# Patient Record
Sex: Male | Born: 1980 | Race: Black or African American | Hispanic: No | Marital: Single | Smoking: Never smoker
Health system: Southern US, Community
[De-identification: ages and names within clinical notes are randomized; demographics above are authoritative.]

## PROBLEM LIST (undated history)

## (undated) DIAGNOSIS — F419 Anxiety disorder, unspecified: Secondary | ICD-10-CM

## (undated) DIAGNOSIS — D571 Sickle-cell disease without crisis: Secondary | ICD-10-CM

## (undated) DIAGNOSIS — F4310 Post-traumatic stress disorder, unspecified: Secondary | ICD-10-CM

## (undated) HISTORY — PX: CHOLECYSTECTOMY: SHX55

## (undated) HISTORY — DX: Anxiety disorder, unspecified: F41.9

## (undated) HISTORY — DX: Post-traumatic stress disorder, unspecified: F43.10

---

## 2000-07-01 ENCOUNTER — Encounter: Payer: Self-pay | Admitting: Family Medicine

## 2000-07-01 ENCOUNTER — Ambulatory Visit (HOSPITAL_COMMUNITY): Admission: RE | Admit: 2000-07-01 | Discharge: 2000-07-01 | Payer: Self-pay | Admitting: Family Medicine

## 2003-06-14 ENCOUNTER — Inpatient Hospital Stay (HOSPITAL_COMMUNITY): Admission: EM | Admit: 2003-06-14 | Discharge: 2003-07-13 | Payer: Self-pay | Admitting: Family Medicine

## 2003-06-14 ENCOUNTER — Encounter: Payer: Self-pay | Admitting: Emergency Medicine

## 2003-07-24 ENCOUNTER — Inpatient Hospital Stay (HOSPITAL_COMMUNITY): Admission: EM | Admit: 2003-07-24 | Discharge: 2003-08-11 | Payer: Self-pay | Admitting: Emergency Medicine

## 2004-01-09 ENCOUNTER — Encounter (INDEPENDENT_AMBULATORY_CARE_PROVIDER_SITE_OTHER): Payer: Self-pay | Admitting: *Deleted

## 2004-01-09 ENCOUNTER — Observation Stay (HOSPITAL_COMMUNITY): Admission: EM | Admit: 2004-01-09 | Discharge: 2004-01-10 | Payer: Self-pay | Admitting: Emergency Medicine

## 2004-04-11 ENCOUNTER — Inpatient Hospital Stay (HOSPITAL_COMMUNITY): Admission: EM | Admit: 2004-04-11 | Discharge: 2004-05-03 | Payer: Self-pay | Admitting: Emergency Medicine

## 2004-05-05 ENCOUNTER — Emergency Department (HOSPITAL_COMMUNITY): Admission: EM | Admit: 2004-05-05 | Discharge: 2004-05-05 | Payer: Self-pay | Admitting: Emergency Medicine

## 2004-05-06 ENCOUNTER — Inpatient Hospital Stay (HOSPITAL_COMMUNITY): Admission: EM | Admit: 2004-05-06 | Discharge: 2004-05-21 | Payer: Self-pay | Admitting: Emergency Medicine

## 2004-05-23 ENCOUNTER — Inpatient Hospital Stay (HOSPITAL_COMMUNITY): Admission: EM | Admit: 2004-05-23 | Discharge: 2004-05-29 | Payer: Self-pay | Admitting: Emergency Medicine

## 2004-06-15 ENCOUNTER — Ambulatory Visit: Payer: Self-pay | Admitting: Hematology & Oncology

## 2004-06-29 ENCOUNTER — Inpatient Hospital Stay (HOSPITAL_COMMUNITY): Admission: EM | Admit: 2004-06-29 | Discharge: 2004-07-24 | Payer: Self-pay | Admitting: Emergency Medicine

## 2004-06-29 ENCOUNTER — Ambulatory Visit: Payer: Self-pay | Admitting: Hematology & Oncology

## 2004-08-18 ENCOUNTER — Ambulatory Visit: Payer: Self-pay | Admitting: Hematology & Oncology

## 2004-10-13 ENCOUNTER — Ambulatory Visit: Payer: Self-pay | Admitting: Hematology & Oncology

## 2004-10-31 ENCOUNTER — Inpatient Hospital Stay (HOSPITAL_COMMUNITY): Admission: EM | Admit: 2004-10-31 | Discharge: 2004-12-01 | Payer: Self-pay | Admitting: Emergency Medicine

## 2004-11-02 ENCOUNTER — Ambulatory Visit: Payer: Self-pay | Admitting: Hematology & Oncology

## 2004-12-01 ENCOUNTER — Ambulatory Visit: Payer: Self-pay | Admitting: Hematology & Oncology

## 2004-12-02 ENCOUNTER — Emergency Department (HOSPITAL_COMMUNITY): Admission: EM | Admit: 2004-12-02 | Discharge: 2004-12-02 | Payer: Self-pay | Admitting: Emergency Medicine

## 2004-12-03 ENCOUNTER — Inpatient Hospital Stay (HOSPITAL_COMMUNITY): Admission: EM | Admit: 2004-12-03 | Discharge: 2004-12-05 | Payer: Self-pay | Admitting: Emergency Medicine

## 2004-12-15 ENCOUNTER — Ambulatory Visit: Payer: Self-pay | Admitting: Hematology & Oncology

## 2005-01-15 ENCOUNTER — Encounter: Admission: RE | Admit: 2005-01-15 | Discharge: 2005-01-15 | Payer: Self-pay | Admitting: Gastroenterology

## 2005-01-19 ENCOUNTER — Encounter: Admission: RE | Admit: 2005-01-19 | Discharge: 2005-01-19 | Payer: Self-pay | Admitting: Gastroenterology

## 2005-03-16 ENCOUNTER — Ambulatory Visit: Payer: Self-pay | Admitting: Hematology & Oncology

## 2005-06-27 ENCOUNTER — Ambulatory Visit: Payer: Self-pay | Admitting: Hematology & Oncology

## 2005-08-07 LAB — CBC WITH DIFFERENTIAL/PLATELET
Eosinophils Absolute: 0.9 10*3/uL — ABNORMAL HIGH (ref 0.0–0.5)
HCT: 34.1 % — ABNORMAL LOW (ref 38.7–49.9)
LYMPH%: 27.5 % (ref 14.0–48.0)
MONO#: 0.8 10*3/uL (ref 0.1–0.9)
NEUT#: 5.7 10*3/uL (ref 1.5–6.5)
NEUT%: 55.5 % (ref 40.0–75.0)
Platelets: 470 10*3/uL — ABNORMAL HIGH (ref 145–400)
WBC: 10.3 10*3/uL — ABNORMAL HIGH (ref 4.0–10.0)

## 2005-08-07 LAB — TECHNOLOGIST REVIEW

## 2005-08-17 ENCOUNTER — Ambulatory Visit: Payer: Self-pay | Admitting: Hematology & Oncology

## 2005-08-17 LAB — CBC WITH DIFFERENTIAL/PLATELET
BASO%: 0.6 % (ref 0.0–2.0)
Basophils Absolute: 0.1 10*3/uL (ref 0.0–0.1)
EOS%: 5.7 % (ref 0.0–7.0)
HCT: 33.4 % — ABNORMAL LOW (ref 38.7–49.9)
LYMPH%: 30.5 % (ref 14.0–48.0)
MCH: 26.3 pg — ABNORMAL LOW (ref 28.0–33.4)
MCHC: 33.3 g/dL (ref 32.0–35.9)
MCV: 79.1 fL — ABNORMAL LOW (ref 81.6–98.0)
MONO%: 8.2 % (ref 0.0–13.0)
NEUT%: 55 % (ref 40.0–75.0)
Platelets: 442 10*3/uL — ABNORMAL HIGH (ref 145–400)
lymph#: 3.4 10*3/uL — ABNORMAL HIGH (ref 0.9–3.3)

## 2005-08-22 ENCOUNTER — Inpatient Hospital Stay (HOSPITAL_COMMUNITY): Admission: EM | Admit: 2005-08-22 | Discharge: 2005-08-25 | Payer: Self-pay | Admitting: Emergency Medicine

## 2005-08-25 ENCOUNTER — Ambulatory Visit: Payer: Self-pay | Admitting: Oncology

## 2005-10-01 ENCOUNTER — Ambulatory Visit: Payer: Self-pay | Admitting: Hematology & Oncology

## 2005-10-03 LAB — CBC WITH DIFFERENTIAL/PLATELET
HCT: 33.9 % — ABNORMAL LOW (ref 38.7–49.9)
MCH: 25.3 pg — ABNORMAL LOW (ref 28.0–33.4)
MCHC: 33 g/dL (ref 32.0–35.9)
RDW: 24.6 % — ABNORMAL HIGH (ref 11.2–14.6)

## 2005-10-03 LAB — MANUAL DIFFERENTIAL
ANC (CHCC manual diff): 3.2 10*3/uL (ref 1.5–6.5)
MONO: 3 % (ref 0–14)

## 2005-10-05 LAB — COMPREHENSIVE METABOLIC PANEL
ALT: 27 U/L (ref 0–40)
AST: 32 U/L (ref 0–37)
Albumin: 4.6 g/dL (ref 3.5–5.2)
BUN: 12 mg/dL (ref 6–23)
CO2: 20 mEq/L (ref 19–32)
Calcium: 9.8 mg/dL (ref 8.4–10.5)
Chloride: 107 mEq/L (ref 96–112)
Potassium: 4.4 mEq/L (ref 3.5–5.3)

## 2005-10-05 LAB — HEMOGLOBINOPATHY EVALUATION: Hgb A2 Quant: 3.5 % — ABNORMAL HIGH (ref 2.2–3.2)

## 2005-10-31 LAB — CBC WITH DIFFERENTIAL/PLATELET
BASO%: 1.6 % (ref 0.0–2.0)
EOS%: 1.8 % (ref 0.0–7.0)
HCT: 36.9 % — ABNORMAL LOW (ref 38.7–49.9)
LYMPH%: 29 % (ref 14.0–48.0)
MCH: 23.1 pg — ABNORMAL LOW (ref 28.0–33.4)
MCHC: 31.2 g/dL — ABNORMAL LOW (ref 32.0–35.9)
MCV: 74.1 fL — ABNORMAL LOW (ref 81.6–98.0)
NEUT%: 57.9 % (ref 40.0–75.0)
Platelets: 495 10*3/uL — ABNORMAL HIGH (ref 145–400)

## 2005-11-22 ENCOUNTER — Ambulatory Visit: Payer: Self-pay | Admitting: Hematology & Oncology

## 2005-11-28 LAB — CBC WITH DIFFERENTIAL/PLATELET
Eosinophils Absolute: 0.1 10*3/uL (ref 0.0–0.5)
HGB: 10.3 g/dL — ABNORMAL LOW (ref 13.0–17.1)
LYMPH%: 35 % (ref 14.0–48.0)
MCH: 24.6 pg — ABNORMAL LOW (ref 28.0–33.4)
MCHC: 31.9 g/dL — ABNORMAL LOW (ref 32.0–35.9)
MCV: 77 fL — ABNORMAL LOW (ref 81.6–98.0)
MONO#: 0.7 10*3/uL (ref 0.1–0.9)
NEUT%: 56.5 % (ref 40.0–75.0)

## 2006-01-21 ENCOUNTER — Ambulatory Visit: Payer: Self-pay | Admitting: Hematology & Oncology

## 2006-01-23 LAB — CBC WITH DIFFERENTIAL/PLATELET
BASO%: 1.8 % (ref 0.0–2.0)
EOS%: 1.2 % (ref 0.0–7.0)
MCH: 26.2 pg — ABNORMAL LOW (ref 28.0–33.4)
MCHC: 32.6 g/dL (ref 32.0–35.9)
MONO%: 8.3 % (ref 0.0–13.0)
NEUT%: 60.9 % (ref 40.0–75.0)
RDW: 24 % — ABNORMAL HIGH (ref 11.2–14.6)
lymph#: 2.7 10*3/uL (ref 0.9–3.3)

## 2006-01-23 LAB — TECHNOLOGIST REVIEW: Technologist Review: 1

## 2006-02-08 ENCOUNTER — Emergency Department (HOSPITAL_COMMUNITY): Admission: EM | Admit: 2006-02-08 | Discharge: 2006-02-08 | Payer: Self-pay | Admitting: Emergency Medicine

## 2006-02-21 LAB — CBC WITH DIFFERENTIAL/PLATELET
BASO%: 1.5 % (ref 0.0–2.0)
LYMPH%: 34.9 % (ref 14.0–48.0)
MCH: 25.1 pg — ABNORMAL LOW (ref 28.0–33.4)
MCHC: 31.6 g/dL — ABNORMAL LOW (ref 32.0–35.9)
MCV: 79.5 fL — ABNORMAL LOW (ref 81.6–98.0)
MONO%: 10.6 % (ref 0.0–13.0)
Platelets: 312 10*3/uL (ref 145–400)
RBC: 4.78 10*6/uL (ref 4.20–5.71)

## 2006-02-21 LAB — TECHNOLOGIST REVIEW

## 2006-03-18 ENCOUNTER — Ambulatory Visit: Payer: Self-pay | Admitting: Hematology & Oncology

## 2006-03-20 LAB — CBC WITH DIFFERENTIAL/PLATELET
Basophils Absolute: 0.1 10*3/uL (ref 0.0–0.1)
EOS%: 1.9 % (ref 0.0–7.0)
HGB: 11.1 g/dL — ABNORMAL LOW (ref 13.0–17.1)
LYMPH%: 33.7 % (ref 14.0–48.0)
MCH: 25.6 pg — ABNORMAL LOW (ref 28.0–33.4)
MCV: 78.3 fL — ABNORMAL LOW (ref 81.6–98.0)
MONO%: 8.7 % (ref 0.0–13.0)
NEUT%: 55.2 % (ref 40.0–75.0)
RDW: 26.3 % — ABNORMAL HIGH (ref 11.2–14.6)

## 2006-05-10 ENCOUNTER — Ambulatory Visit: Payer: Self-pay | Admitting: Hematology & Oncology

## 2006-05-15 LAB — CBC WITH DIFFERENTIAL/PLATELET
EOS%: 2.1 % (ref 0.0–7.0)
LYMPH%: 41.9 % (ref 14.0–48.0)
MCH: 25.4 pg — ABNORMAL LOW (ref 28.0–33.4)
MCHC: 32.1 g/dL (ref 32.0–35.9)
MCV: 79.1 fL — ABNORMAL LOW (ref 81.6–98.0)
MONO%: 6.8 % (ref 0.0–13.0)
Platelets: 471 10*3/uL — ABNORMAL HIGH (ref 145–400)
RBC: 4.37 10*6/uL (ref 4.20–5.71)
RDW: 21.3 % — ABNORMAL HIGH (ref 11.2–14.6)

## 2006-05-15 LAB — TECHNOLOGIST REVIEW

## 2006-07-08 ENCOUNTER — Ambulatory Visit: Payer: Self-pay | Admitting: Hematology & Oncology

## 2006-07-11 LAB — CBC WITH DIFFERENTIAL/PLATELET
BASO%: 3.2 % — ABNORMAL HIGH (ref 0.0–2.0)
EOS%: 0.7 % (ref 0.0–7.0)
HCT: 34 % — ABNORMAL LOW (ref 38.7–49.9)
LYMPH%: 23.4 % (ref 14.0–48.0)
MCH: 23.8 pg — ABNORMAL LOW (ref 28.0–33.4)
MCHC: 31.7 g/dL — ABNORMAL LOW (ref 32.0–35.9)
MONO#: 1.5 10*3/uL — ABNORMAL HIGH (ref 0.1–0.9)
NEUT%: 58.8 % (ref 40.0–75.0)
Platelets: 473 10*3/uL — ABNORMAL HIGH (ref 145–400)
RBC: 4.53 10*6/uL (ref 4.20–5.71)
WBC: 11 10*3/uL — ABNORMAL HIGH (ref 4.0–10.0)
lymph#: 2.6 10*3/uL (ref 0.9–3.3)

## 2006-07-11 LAB — TECHNOLOGIST REVIEW

## 2006-07-15 LAB — HEMOGLOBINOPATHY EVALUATION
Hgb A: 0 % — ABNORMAL LOW (ref 96.8–97.8)
Hgb S Quant: 88.6 % — ABNORMAL HIGH (ref 0.0–0.0)

## 2006-07-15 LAB — FERRITIN: Ferritin: 28 ng/mL (ref 22–322)

## 2006-08-27 ENCOUNTER — Ambulatory Visit: Payer: Self-pay | Admitting: Hematology & Oncology

## 2006-08-29 ENCOUNTER — Ambulatory Visit: Payer: Self-pay | Admitting: Hematology & Oncology

## 2006-08-29 LAB — CBC WITH DIFFERENTIAL/PLATELET
Basophils Absolute: 0.2 10*3/uL — ABNORMAL HIGH (ref 0.0–0.1)
EOS%: 8.8 % — ABNORMAL HIGH (ref 0.0–7.0)
HCT: 32.9 % — ABNORMAL LOW (ref 38.7–49.9)
HGB: 10.6 g/dL — ABNORMAL LOW (ref 13.0–17.1)
MCH: 25.5 pg — ABNORMAL LOW (ref 28.0–33.4)
MCV: 78.7 fL — ABNORMAL LOW (ref 81.6–98.0)
MONO%: 7.5 % (ref 0.0–13.0)
NEUT%: 54.3 % (ref 40.0–75.0)

## 2006-08-29 LAB — TECHNOLOGIST REVIEW: Technologist Review: 4

## 2006-10-22 ENCOUNTER — Ambulatory Visit: Payer: Self-pay | Admitting: Hematology & Oncology

## 2006-10-24 ENCOUNTER — Ambulatory Visit: Payer: Self-pay | Admitting: Hematology & Oncology

## 2006-10-24 LAB — CHCC SMEAR

## 2006-10-24 LAB — CBC & DIFF AND RETIC
Basophils Absolute: 0.2 10*3/uL — ABNORMAL HIGH (ref 0.0–0.1)
Eosinophils Absolute: 0.2 10*3/uL (ref 0.0–0.5)
HGB: 10.7 g/dL — ABNORMAL LOW (ref 13.0–17.1)
IRF: 0.57 — ABNORMAL HIGH (ref 0.070–0.380)
MONO#: 1.1 10*3/uL — ABNORMAL HIGH (ref 0.1–0.9)
NEUT#: 5.3 10*3/uL (ref 1.5–6.5)
RDW: 19.6 % — ABNORMAL HIGH (ref 11.2–14.6)
RETIC #: 329.4 10*3/uL — ABNORMAL HIGH (ref 31.8–103.9)
Retic %: 7.4 % — ABNORMAL HIGH (ref 0.7–2.3)
WBC: 9.2 10*3/uL (ref 4.0–10.0)
lymph#: 2.4 10*3/uL (ref 0.9–3.3)

## 2006-10-28 LAB — HEMOGLOBINOPATHY EVALUATION: Hgb A: 0 % — ABNORMAL LOW (ref 96.8–97.8)

## 2006-10-28 LAB — FERRITIN: Ferritin: 25 ng/mL (ref 22–322)

## 2006-11-21 LAB — CBC WITH DIFFERENTIAL/PLATELET
Basophils Absolute: 0.1 10*3/uL (ref 0.0–0.1)
Eosinophils Absolute: 0.1 10*3/uL (ref 0.0–0.5)
HCT: 34.2 % — ABNORMAL LOW (ref 38.7–49.9)
HGB: 10.8 g/dL — ABNORMAL LOW (ref 13.0–17.1)
MCH: 22.9 pg — ABNORMAL LOW (ref 28.0–33.4)
MONO#: 0.8 10*3/uL (ref 0.1–0.9)
NEUT#: 5.1 10*3/uL (ref 1.5–6.5)
NEUT%: 62.5 % (ref 40.0–75.0)
WBC: 8.2 10*3/uL (ref 4.0–10.0)
lymph#: 2.1 10*3/uL (ref 0.9–3.3)

## 2006-12-17 ENCOUNTER — Ambulatory Visit: Payer: Self-pay | Admitting: Hematology & Oncology

## 2006-12-19 ENCOUNTER — Ambulatory Visit: Payer: Self-pay | Admitting: Hematology & Oncology

## 2006-12-19 LAB — CBC WITH DIFFERENTIAL/PLATELET
Basophils Absolute: 0.2 10*3/uL — ABNORMAL HIGH (ref 0.0–0.1)
Eosinophils Absolute: 0.2 10*3/uL (ref 0.0–0.5)
HCT: 35.1 % — ABNORMAL LOW (ref 38.7–49.9)
HGB: 11 g/dL — ABNORMAL LOW (ref 13.0–17.1)
LYMPH%: 32.2 % (ref 14.0–48.0)
MCV: 73.8 fL — ABNORMAL LOW (ref 81.6–98.0)
MONO#: 1 10*3/uL — ABNORMAL HIGH (ref 0.1–0.9)
MONO%: 9.7 % (ref 0.0–13.0)
NEUT#: 5.7 10*3/uL (ref 1.5–6.5)
Platelets: 406 10*3/uL — ABNORMAL HIGH (ref 145–400)
WBC: 10.4 10*3/uL — ABNORMAL HIGH (ref 4.0–10.0)

## 2007-01-16 LAB — CBC WITH DIFFERENTIAL/PLATELET
BASO%: 1.5 % (ref 0.0–2.0)
Eosinophils Absolute: 0.1 10*3/uL (ref 0.0–0.5)
HCT: 35.2 % — ABNORMAL LOW (ref 38.7–49.9)
LYMPH%: 41.2 % (ref 14.0–48.0)
MCHC: 31.6 g/dL — ABNORMAL LOW (ref 32.0–35.9)
MCV: 76.4 fL — ABNORMAL LOW (ref 81.6–98.0)
MONO#: 0.9 10*3/uL (ref 0.1–0.9)
MONO%: 12.1 % (ref 0.0–13.0)
NEUT%: 43.8 % (ref 40.0–75.0)
Platelets: 426 10*3/uL — ABNORMAL HIGH (ref 145–400)
RBC: 4.61 10*6/uL (ref 4.20–5.71)

## 2007-01-16 LAB — TECHNOLOGIST REVIEW

## 2007-01-16 LAB — RETICULOCYTES: Retic %: 6.1 % — ABNORMAL HIGH (ref 0.7–2.3)

## 2007-01-20 LAB — HEMOGLOBINOPATHY EVALUATION
Hemoglobin Other: 0 % (ref 0.0–0.0)
Hgb A2 Quant: 3.4 % — ABNORMAL HIGH (ref 2.2–3.2)
Hgb A: 0 % — ABNORMAL LOW (ref 96.8–97.8)
Hgb S Quant: 89.6 % — ABNORMAL HIGH (ref 0.0–0.0)

## 2007-01-20 LAB — FERRITIN: Ferritin: 26 ng/mL (ref 22–322)

## 2007-02-11 ENCOUNTER — Ambulatory Visit: Payer: Self-pay | Admitting: Hematology & Oncology

## 2007-04-08 ENCOUNTER — Ambulatory Visit: Payer: Self-pay | Admitting: Hematology & Oncology

## 2007-04-11 ENCOUNTER — Ambulatory Visit: Payer: Self-pay | Admitting: Hematology & Oncology

## 2007-04-11 LAB — CBC WITH DIFFERENTIAL/PLATELET
BASO%: 1.3 % (ref 0.0–2.0)
Basophils Absolute: 0.1 10*3/uL (ref 0.0–0.1)
EOS%: 1 % (ref 0.0–7.0)
HCT: 32.1 % — ABNORMAL LOW (ref 38.7–49.9)
HGB: 10.5 g/dL — ABNORMAL LOW (ref 13.0–17.1)
MCH: 26.2 pg — ABNORMAL LOW (ref 28.0–33.4)
MCHC: 32.7 g/dL (ref 32.0–35.9)
MCV: 80.2 fL — ABNORMAL LOW (ref 81.6–98.0)
MONO%: 11 % (ref 0.0–13.0)
NEUT%: 46.8 % (ref 40.0–75.0)
RDW: 17.5 % — ABNORMAL HIGH (ref 11.2–14.6)

## 2007-05-08 LAB — CBC & DIFF AND RETIC
BASO%: 1.8 % (ref 0.0–2.0)
EOS%: 1.2 % (ref 0.0–7.0)
LYMPH%: 35.3 % (ref 14.0–48.0)
MCH: 23.1 pg — ABNORMAL LOW (ref 28.0–33.4)
MCHC: 31.1 g/dL — ABNORMAL LOW (ref 32.0–35.9)
MCV: 74.2 fL — ABNORMAL LOW (ref 81.6–98.0)
MONO%: 8.9 % (ref 0.0–13.0)
Platelets: 446 10*3/uL — ABNORMAL HIGH (ref 145–400)
RBC: 4.74 10*6/uL (ref 4.20–5.71)
RDW: 16.4 % — ABNORMAL HIGH (ref 11.2–14.6)
Retic %: 4.6 % — ABNORMAL HIGH (ref 0.7–2.3)

## 2007-05-14 LAB — HEMOGLOBINOPATHY EVALUATION
Hemoglobin Other: 0 % (ref 0.0–0.0)
Hgb A: 0 % — ABNORMAL LOW (ref 96.8–97.8)
Hgb S Quant: 87.6 % — ABNORMAL HIGH (ref 0.0–0.0)

## 2007-06-03 ENCOUNTER — Ambulatory Visit: Payer: Self-pay | Admitting: Hematology & Oncology

## 2007-07-29 ENCOUNTER — Ambulatory Visit: Payer: Self-pay | Admitting: Hematology & Oncology

## 2007-07-31 LAB — CBC WITH DIFFERENTIAL/PLATELET
BASO%: 1.6 % (ref 0.0–2.0)
Basophils Absolute: 0.1 10*3/uL (ref 0.0–0.1)
EOS%: 4.8 % (ref 0.0–7.0)
Eosinophils Absolute: 0.4 10*3/uL (ref 0.0–0.5)
HCT: 36.8 % — ABNORMAL LOW (ref 38.7–49.9)
HGB: 11.7 g/dL — ABNORMAL LOW (ref 13.0–17.1)
LYMPH%: 37.7 % (ref 14.0–48.0)
MCH: 23.8 pg — ABNORMAL LOW (ref 28.0–33.4)
MCHC: 31.9 g/dL — ABNORMAL LOW (ref 32.0–35.9)
MCV: 74.6 fL — ABNORMAL LOW (ref 81.6–98.0)
MONO#: 0.9 10*3/uL (ref 0.1–0.9)
MONO%: 11.5 % (ref 0.0–13.0)
NEUT#: 3.7 10*3/uL (ref 1.5–6.5)
NEUT%: 44.4 % (ref 40.0–75.0)
Platelets: 408 10*3/uL — ABNORMAL HIGH (ref 145–400)
RBC: 4.93 10*6/uL (ref 4.20–5.71)
RDW: 21.3 % — ABNORMAL HIGH (ref 11.2–14.6)
WBC: 8.3 10*3/uL (ref 4.0–10.0)
lymph#: 3.1 10*3/uL (ref 0.9–3.3)

## 2007-09-23 ENCOUNTER — Ambulatory Visit: Payer: Self-pay | Admitting: Hematology & Oncology

## 2007-09-25 LAB — CBC WITH DIFFERENTIAL/PLATELET
BASO%: 1.7 % (ref 0.0–2.0)
EOS%: 2.2 % (ref 0.0–7.0)
MCH: 24.1 pg — ABNORMAL LOW (ref 28.0–33.4)
MCV: 74 fL — ABNORMAL LOW (ref 81.6–98.0)
MONO%: 10.4 % (ref 0.0–13.0)
NEUT#: 3.8 10*3/uL (ref 1.5–6.5)
RBC: 4.76 10*6/uL (ref 4.20–5.71)
RDW: 21 % — ABNORMAL HIGH (ref 11.2–14.6)

## 2007-09-25 LAB — TECHNOLOGIST REVIEW: Technologist Review: 1

## 2007-10-23 LAB — CBC WITH DIFFERENTIAL/PLATELET
BASO%: 2.2 % — ABNORMAL HIGH (ref 0.0–2.0)
Eosinophils Absolute: 0.1 10*3/uL (ref 0.0–0.5)
MCHC: 32.5 g/dL (ref 32.0–35.9)
MONO#: 1.1 10*3/uL — ABNORMAL HIGH (ref 0.1–0.9)
NEUT#: 5.2 10*3/uL (ref 1.5–6.5)
RBC: 4.4 10*6/uL (ref 4.20–5.71)
RDW: 21.4 % — ABNORMAL HIGH (ref 11.2–14.6)
WBC: 9.8 10*3/uL (ref 4.0–10.0)

## 2007-10-23 LAB — TECHNOLOGIST REVIEW: Technologist Review: 1

## 2007-10-27 ENCOUNTER — Ambulatory Visit: Payer: Self-pay | Admitting: Hematology & Oncology

## 2007-10-29 LAB — CBC WITH DIFFERENTIAL (CANCER CENTER ONLY)
BASO#: 0.1 10*3/uL (ref 0.0–0.2)
EOS%: 1.9 % (ref 0.0–7.0)
Eosinophils Absolute: 0.2 10*3/uL (ref 0.0–0.5)
HGB: 11.5 g/dL — ABNORMAL LOW (ref 13.0–17.1)
LYMPH%: 35.6 % (ref 14.0–48.0)
MCH: 25 pg — ABNORMAL LOW (ref 28.0–33.4)
MCHC: 31.3 g/dL — ABNORMAL LOW (ref 32.0–35.9)
MCV: 80 fL — ABNORMAL LOW (ref 82–98)
MONO%: 7.5 % (ref 0.0–13.0)
NEUT#: 4.8 10*3/uL (ref 1.5–6.5)
NEUT%: 54.4 % (ref 40.0–80.0)
RBC: 4.61 10*6/uL (ref 4.20–5.70)

## 2007-10-29 LAB — CHCC SATELLITE - SMEAR

## 2007-10-31 LAB — RETICULOCYTES (CHCC)
RBC.: 4.82 MIL/uL (ref 4.22–5.81)
Retic Ct Pct: 7.8 % — ABNORMAL HIGH (ref 0.4–3.1)

## 2007-10-31 LAB — HEMOGLOBINOPATHY EVALUATION
Hgb F Quant: 8.1 % — ABNORMAL HIGH (ref 0.0–2.0)
Hgb S Quant: 88.3 % — ABNORMAL HIGH (ref 0.0–0.0)

## 2007-11-18 ENCOUNTER — Ambulatory Visit: Payer: Self-pay | Admitting: Hematology & Oncology

## 2007-11-20 LAB — CBC WITH DIFFERENTIAL (CANCER CENTER ONLY)
HCT: 37.3 % — ABNORMAL LOW (ref 38.7–49.9)
HGB: 11.7 g/dL — ABNORMAL LOW (ref 13.0–17.1)
MCV: 76 fL — ABNORMAL LOW (ref 82–98)
RDW: 19.4 % — ABNORMAL HIGH (ref 10.5–14.6)
WBC: 9 10*3/uL (ref 4.0–10.0)

## 2007-11-20 LAB — MANUAL DIFFERENTIAL (CHCC SATELLITE)
ALC: 1.2 10*3/uL (ref 0.9–3.3)
ANC (CHCC HP manual diff): 7 10*3/uL — ABNORMAL HIGH (ref 1.5–6.5)
MONO: 6 % (ref 0–13)
PLT EST ~~LOC~~: ADEQUATE

## 2007-12-17 ENCOUNTER — Ambulatory Visit: Payer: Self-pay | Admitting: Hematology & Oncology

## 2008-02-11 ENCOUNTER — Ambulatory Visit: Payer: Self-pay | Admitting: Hematology & Oncology

## 2008-02-12 LAB — MANUAL DIFFERENTIAL (CHCC SATELLITE)
ALC: 2.5 10*3/uL (ref 0.9–3.3)
ANC (CHCC HP manual diff): 4.7 10*3/uL (ref 1.5–6.5)
LYMPH: 31 % (ref 14–48)
PLT EST ~~LOC~~: ADEQUATE
nRBC: 5 % — ABNORMAL HIGH (ref 0–0)

## 2008-02-12 LAB — CBC WITH DIFFERENTIAL (CANCER CENTER ONLY)
HGB: 10.5 g/dL — ABNORMAL LOW (ref 13.0–17.1)
MCH: 26 pg — ABNORMAL LOW (ref 28.0–33.4)
MCV: 81 fL — ABNORMAL LOW (ref 82–98)
RBC: 4.03 10*6/uL — ABNORMAL LOW (ref 4.20–5.70)
WBC: 8.2 10*3/uL (ref 4.0–10.0)

## 2008-02-12 LAB — RETICULOCYTES (CHCC): RBC.: 4.17 MIL/uL — ABNORMAL LOW (ref 4.22–5.81)

## 2008-03-11 LAB — CBC WITH DIFFERENTIAL (CANCER CENTER ONLY)
EOS%: 3 % (ref 0.0–7.0)
HCT: 33.1 % — ABNORMAL LOW (ref 38.7–49.9)
MCH: 26.3 pg — ABNORMAL LOW (ref 28.0–33.4)
MCHC: 32.2 g/dL (ref 32.0–35.9)
MCV: 82 fL (ref 82–98)
MONO#: 0.9 10*3/uL (ref 0.1–0.9)
NEUT%: 56 % (ref 40.0–80.0)
RDW: 18.4 % — ABNORMAL HIGH (ref 10.5–14.6)

## 2008-04-07 ENCOUNTER — Ambulatory Visit: Payer: Self-pay | Admitting: Hematology & Oncology

## 2008-05-12 LAB — CBC WITH DIFFERENTIAL (CANCER CENTER ONLY)
BASO#: 0.1 10*3/uL (ref 0.0–0.2)
Eosinophils Absolute: 0.2 10*3/uL (ref 0.0–0.5)
HCT: 34 % — ABNORMAL LOW (ref 38.7–49.9)
LYMPH%: 42.2 % (ref 14.0–48.0)
MCH: 26 pg — ABNORMAL LOW (ref 28.0–33.4)
MCV: 81 fL — ABNORMAL LOW (ref 82–98)
MONO#: 0.6 10*3/uL (ref 0.1–0.9)
MONO%: 7.4 % (ref 0.0–13.0)
NEUT%: 47.7 % (ref 40.0–80.0)
RBC: 4.2 10*6/uL (ref 4.20–5.70)
WBC: 8.6 10*3/uL (ref 4.0–10.0)

## 2008-05-12 LAB — TECHNOLOGIST REVIEW CHCC SATELLITE: Tech Review: 2

## 2008-06-02 ENCOUNTER — Ambulatory Visit: Payer: Self-pay | Admitting: Hematology & Oncology

## 2008-06-07 LAB — MANUAL DIFFERENTIAL (CHCC SATELLITE)
ALC: 3.9 10*3/uL — ABNORMAL HIGH (ref 0.9–3.3)
Eos: 1 % (ref 0–7)
LYMPH: 46 % (ref 14–48)
MONO: 12 % (ref 0–13)
PLT EST ~~LOC~~: INCREASED
SEG: 40 % (ref 40–75)

## 2008-06-07 LAB — CBC WITH DIFFERENTIAL (CANCER CENTER ONLY)
HGB: 11.3 g/dL — ABNORMAL LOW (ref 13.0–17.1)
MCH: 24.7 pg — ABNORMAL LOW (ref 28.0–33.4)
MCHC: 30.7 g/dL — ABNORMAL LOW (ref 32.0–35.9)

## 2008-07-27 ENCOUNTER — Ambulatory Visit: Payer: Self-pay | Admitting: Hematology & Oncology

## 2008-07-28 LAB — CBC WITH DIFFERENTIAL (CANCER CENTER ONLY)
BASO#: 0.1 10*3/uL (ref 0.0–0.2)
EOS%: 4 % (ref 0.0–7.0)
HCT: 34.1 % — ABNORMAL LOW (ref 38.7–49.9)
HGB: 11 g/dL — ABNORMAL LOW (ref 13.0–17.1)
LYMPH#: 2 10*3/uL (ref 0.9–3.3)
MCH: 27.4 pg — ABNORMAL LOW (ref 28.0–33.4)
MCHC: 32.3 g/dL (ref 32.0–35.9)
MONO%: 7.3 % (ref 0.0–13.0)
NEUT%: 67.8 % (ref 40.0–80.0)

## 2008-07-31 ENCOUNTER — Inpatient Hospital Stay (HOSPITAL_COMMUNITY): Admission: EM | Admit: 2008-07-31 | Discharge: 2008-08-01 | Payer: Self-pay | Admitting: Emergency Medicine

## 2008-08-16 LAB — CBC WITH DIFFERENTIAL (CANCER CENTER ONLY)
Eosinophils Absolute: 0.5 10*3/uL (ref 0.0–0.5)
MONO#: 0.4 10*3/uL (ref 0.1–0.9)
NEUT#: 3.4 10*3/uL (ref 1.5–6.5)
Platelets: 659 10*3/uL — ABNORMAL HIGH (ref 145–400)
RBC: 3.7 10*6/uL — ABNORMAL LOW (ref 4.20–5.70)
WBC: 7.4 10*3/uL (ref 4.0–10.0)

## 2008-08-18 LAB — HEMOGLOBINOPATHY EVALUATION
Hgb A2 Quant: 3.5 % — ABNORMAL HIGH (ref 2.2–3.2)
Hgb A: 0 % — ABNORMAL LOW (ref 96.8–97.8)

## 2008-09-21 ENCOUNTER — Ambulatory Visit: Payer: Self-pay | Admitting: Hematology & Oncology

## 2008-09-22 LAB — MANUAL DIFFERENTIAL (CHCC SATELLITE)
ALC: 2.2 10*3/uL (ref 0.9–3.3)
ANC (CHCC HP manual diff): 5.2 10*3/uL (ref 1.5–6.5)
Eos: 4 % (ref 0–7)
MONO: 8 % (ref 0–13)
PLT EST ~~LOC~~: INCREASED

## 2008-09-22 LAB — CBC WITH DIFFERENTIAL (CANCER CENTER ONLY)
HCT: 36.8 % — ABNORMAL LOW (ref 38.7–49.9)
MCV: 87 fL (ref 82–98)
RBC: 4.24 10*6/uL (ref 4.20–5.70)
WBC: 8.5 10*3/uL (ref 4.0–10.0)

## 2008-10-01 LAB — JAK2 GENOTYPR: JAK2 GenotypR: NOT DETECTED

## 2008-10-01 LAB — LACTATE DEHYDROGENASE: LDH: 195 U/L (ref 94–250)

## 2008-10-26 ENCOUNTER — Ambulatory Visit: Payer: Self-pay | Admitting: Hematology & Oncology

## 2008-10-27 LAB — MANUAL DIFFERENTIAL (CHCC SATELLITE)
ALC: 4.2 10*3/uL — ABNORMAL HIGH (ref 0.9–3.3)
PLT EST ~~LOC~~: ADEQUATE
SEG: 41 % (ref 40–75)
nRBC: 4 % — ABNORMAL HIGH (ref 0–0)

## 2008-10-27 LAB — CBC WITH DIFFERENTIAL (CANCER CENTER ONLY)
HGB: 11.3 g/dL — ABNORMAL LOW (ref 13.0–17.1)
MCHC: 31.9 g/dL — ABNORMAL LOW (ref 32.0–35.9)
RDW: 17.4 % — ABNORMAL HIGH (ref 10.5–14.6)

## 2008-11-25 ENCOUNTER — Ambulatory Visit: Payer: Self-pay | Admitting: Hematology & Oncology

## 2008-11-25 LAB — CBC WITH DIFFERENTIAL (CANCER CENTER ONLY)
MCH: 27.2 pg — ABNORMAL LOW (ref 28.0–33.4)
MCV: 84 fL (ref 82–98)
Platelets: 364 10*3/uL (ref 145–400)
RDW: 17.3 % — ABNORMAL HIGH (ref 10.5–14.6)
WBC: 7.9 10*3/uL (ref 4.0–10.0)

## 2008-11-25 LAB — MANUAL DIFFERENTIAL (CHCC SATELLITE)
ALC: 3.2 10*3/uL (ref 0.9–3.3)
ANC (CHCC HP manual diff): 4 10*3/uL (ref 1.5–6.5)
PLT EST ~~LOC~~: ADEQUATE
SEG: 50 % (ref 40–75)

## 2008-11-29 LAB — HEMOGLOBINOPATHY EVALUATION
Hemoglobin Other: 0 % (ref 0.0–0.0)
Hgb F Quant: 10.4 % — ABNORMAL HIGH (ref 0.0–2.0)
Hgb S Quant: 86.2 % — ABNORMAL HIGH (ref 0.0–0.0)

## 2008-12-22 LAB — MANUAL DIFFERENTIAL (CHCC SATELLITE)
ALC: 2.9 10*3/uL (ref 0.9–3.3)
ANC (CHCC HP manual diff): 6 10*3/uL (ref 1.5–6.5)
LYMPH: 30 % (ref 14–48)
PLT EST ~~LOC~~: ADEQUATE
SEG: 62 % (ref 40–75)

## 2008-12-22 LAB — CBC WITH DIFFERENTIAL (CANCER CENTER ONLY)
HCT: 32.2 % — ABNORMAL LOW (ref 38.7–49.9)
HGB: 10.5 g/dL — ABNORMAL LOW (ref 13.0–17.1)
MCH: 27.8 pg — ABNORMAL LOW (ref 28.0–33.4)
RBC: 3.78 10*6/uL — ABNORMAL LOW (ref 4.20–5.70)

## 2009-01-19 ENCOUNTER — Ambulatory Visit: Payer: Self-pay | Admitting: Hematology & Oncology

## 2009-01-20 LAB — CBC WITH DIFFERENTIAL (CANCER CENTER ONLY)
BASO#: 0.1 10*3/uL (ref 0.0–0.2)
EOS%: 2.3 % (ref 0.0–7.0)
HGB: 12 g/dL — ABNORMAL LOW (ref 13.0–17.1)
LYMPH#: 3.4 10*3/uL — ABNORMAL HIGH (ref 0.9–3.3)
MCHC: 32.1 g/dL (ref 32.0–35.9)
MONO#: 0.6 10*3/uL (ref 0.1–0.9)
NEUT#: 3.4 10*3/uL (ref 1.5–6.5)
RBC: 4.54 10*6/uL (ref 4.20–5.70)
WBC: 7.5 10*3/uL (ref 4.0–10.0)

## 2009-01-20 LAB — TECHNOLOGIST REVIEW CHCC SATELLITE

## 2009-01-20 LAB — CHCC SATELLITE - SMEAR

## 2009-01-21 LAB — RETICULOCYTES (CHCC)
ABS Retic: 331.9 10*3/uL — ABNORMAL HIGH (ref 19.0–186.0)
RBC.: 4.61 MIL/uL (ref 4.22–5.81)

## 2009-01-21 LAB — FERRITIN: Ferritin: 22 ng/mL (ref 22–322)

## 2009-04-15 ENCOUNTER — Ambulatory Visit: Payer: Self-pay | Admitting: Hematology & Oncology

## 2009-04-18 LAB — CBC WITH DIFFERENTIAL (CANCER CENTER ONLY)
HCT: 37.4 % — ABNORMAL LOW (ref 38.7–49.9)
MCH: 27.3 pg — ABNORMAL LOW (ref 28.0–33.4)
MCHC: 31.6 g/dL — ABNORMAL LOW (ref 32.0–35.9)
MCV: 87 fL (ref 82–98)
RBC: 4.32 10*6/uL (ref 4.20–5.70)
RDW: 17.9 % — ABNORMAL HIGH (ref 10.5–14.6)
WBC: 8.7 10*3/uL (ref 4.0–10.0)

## 2009-04-18 LAB — MANUAL DIFFERENTIAL (CHCC SATELLITE)
LYMPH: 21 % (ref 14–48)
MONO: 14 % — ABNORMAL HIGH (ref 0–13)
PLT EST ~~LOC~~: ADEQUATE

## 2009-04-20 LAB — COMPREHENSIVE METABOLIC PANEL
Albumin: 4.5 g/dL (ref 3.5–5.2)
Alkaline Phosphatase: 86 U/L (ref 39–117)
BUN: 12 mg/dL (ref 6–23)
CO2: 20 mEq/L (ref 19–32)
Chloride: 105 mEq/L (ref 96–112)
Creatinine, Ser: 1.02 mg/dL (ref 0.40–1.50)
Glucose, Bld: 89 mg/dL (ref 70–99)
Total Bilirubin: 2.8 mg/dL — ABNORMAL HIGH (ref 0.3–1.2)
Total Protein: 7.6 g/dL (ref 6.0–8.3)

## 2009-04-20 LAB — FERRITIN: Ferritin: 38 ng/mL (ref 22–322)

## 2009-07-19 ENCOUNTER — Ambulatory Visit: Payer: Self-pay | Admitting: Hematology & Oncology

## 2009-08-15 LAB — COMPREHENSIVE METABOLIC PANEL
AST: 35 U/L (ref 0–37)
Albumin: 4.6 g/dL (ref 3.5–5.2)
Alkaline Phosphatase: 88 U/L (ref 39–117)
BUN: 13 mg/dL (ref 6–23)
Potassium: 4.2 mEq/L (ref 3.5–5.3)
Sodium: 138 mEq/L (ref 135–145)

## 2009-08-15 LAB — CBC WITH DIFFERENTIAL (CANCER CENTER ONLY)
BASO%: 0.6 % (ref 0.0–2.0)
EOS%: 2.9 % (ref 0.0–7.0)
LYMPH#: 3.1 10*3/uL (ref 0.9–3.3)
MCHC: 32.3 g/dL (ref 32.0–35.9)
NEUT#: 4.4 10*3/uL (ref 1.5–6.5)
NEUT%: 51.7 % (ref 40.0–80.0)
Platelets: 388 10*3/uL (ref 145–400)
RDW: 17 % — ABNORMAL HIGH (ref 10.5–14.6)

## 2009-10-14 ENCOUNTER — Ambulatory Visit: Payer: Self-pay | Admitting: Hematology & Oncology

## 2009-10-17 LAB — CBC WITH DIFFERENTIAL (CANCER CENTER ONLY)
HCT: 33.7 % — ABNORMAL LOW (ref 38.7–49.9)
MCH: 27.9 pg — ABNORMAL LOW (ref 28.0–33.4)
MCHC: 32.5 g/dL (ref 32.0–35.9)
MCV: 86 fL (ref 82–98)
WBC: 7.7 10*3/uL (ref 4.0–10.0)

## 2009-10-17 LAB — MANUAL DIFFERENTIAL (CHCC SATELLITE)
ALC: 1.6 10*3/uL (ref 0.9–3.3)
LYMPH: 21 % (ref 14–48)
Myelocytes: 1 % — ABNORMAL HIGH (ref 0–0)
PLT EST ~~LOC~~: ADEQUATE
SEG: 71 % (ref 40–75)

## 2009-10-19 LAB — FERRITIN: Ferritin: 66 ng/mL (ref 22–322)

## 2009-10-19 LAB — COMPREHENSIVE METABOLIC PANEL
AST: 37 U/L (ref 0–37)
Albumin: 4.2 g/dL (ref 3.5–5.2)
Alkaline Phosphatase: 83 U/L (ref 39–117)
CO2: 18 mEq/L — ABNORMAL LOW (ref 19–32)
Potassium: 4.3 mEq/L (ref 3.5–5.3)
Sodium: 137 mEq/L (ref 135–145)
Total Bilirubin: 3.3 mg/dL — ABNORMAL HIGH (ref 0.3–1.2)
Total Protein: 7.4 g/dL (ref 6.0–8.3)

## 2009-10-19 LAB — HEMOGLOBINOPATHY EVALUATION
Hemoglobin Other: 0 % (ref 0.0–0.0)
Hgb A2 Quant: 3.3 % — ABNORMAL HIGH (ref 2.2–3.2)
Hgb A: 0 % — ABNORMAL LOW (ref 96.8–97.8)
Hgb F Quant: 10.4 % — ABNORMAL HIGH (ref 0.0–2.0)
Hgb S Quant: 86.3 % — ABNORMAL HIGH (ref 0.0–0.0)

## 2010-01-10 ENCOUNTER — Ambulatory Visit: Payer: Self-pay | Admitting: Hematology & Oncology

## 2010-01-12 LAB — CBC WITH DIFFERENTIAL (CANCER CENTER ONLY)
BASO%: 0.7 % (ref 0.0–2.0)
HCT: 33 % — ABNORMAL LOW (ref 38.7–49.9)
HGB: 10.6 g/dL — ABNORMAL LOW (ref 13.0–17.1)
LYMPH#: 3.6 10*3/uL — ABNORMAL HIGH (ref 0.9–3.3)
LYMPH%: 44.1 % (ref 14.0–48.0)
MCHC: 32 g/dL (ref 32.0–35.9)
MONO#: 0.7 10*3/uL (ref 0.1–0.9)
MONO%: 8.4 % (ref 0.0–13.0)
NEUT#: 3.7 10*3/uL (ref 1.5–6.5)
NEUT%: 44.8 % (ref 40.0–80.0)
WBC: 8.3 10*3/uL (ref 4.0–10.0)

## 2010-01-12 LAB — TECHNOLOGIST REVIEW CHCC SATELLITE: Tech Review: 3

## 2010-01-16 LAB — COMPREHENSIVE METABOLIC PANEL
ALT: 17 U/L (ref 0–53)
Alkaline Phosphatase: 82 U/L (ref 39–117)
Calcium: 9.6 mg/dL (ref 8.4–10.5)
Glucose, Bld: 110 mg/dL — ABNORMAL HIGH (ref 70–99)
Total Bilirubin: 2.6 mg/dL — ABNORMAL HIGH (ref 0.3–1.2)

## 2010-01-16 LAB — HEMOGLOBINOPATHY EVALUATION
Hemoglobin Other: 0 % (ref 0.0–0.0)
Hgb A2 Quant: 3.3 % — ABNORMAL HIGH (ref 2.2–3.2)
Hgb A: 0 % — ABNORMAL LOW (ref 96.8–97.8)
Hgb S Quant: 86.7 % — ABNORMAL HIGH (ref 0.0–0.0)

## 2010-01-16 LAB — FERRITIN: Ferritin: 44 ng/mL (ref 22–322)

## 2010-02-21 ENCOUNTER — Inpatient Hospital Stay (HOSPITAL_COMMUNITY)
Admission: EM | Admit: 2010-02-21 | Discharge: 2010-02-26 | Payer: Self-pay | Source: Home / Self Care | Admitting: Emergency Medicine

## 2010-02-28 ENCOUNTER — Inpatient Hospital Stay (HOSPITAL_COMMUNITY)
Admission: EM | Admit: 2010-02-28 | Discharge: 2010-03-15 | Payer: Self-pay | Source: Home / Self Care | Attending: Hematology & Oncology | Admitting: Hematology & Oncology

## 2010-03-04 ENCOUNTER — Ambulatory Visit: Payer: Self-pay | Admitting: Hematology and Oncology

## 2010-03-23 ENCOUNTER — Ambulatory Visit: Payer: Self-pay | Admitting: Hematology & Oncology

## 2010-03-24 LAB — CBC WITH DIFFERENTIAL (CANCER CENTER ONLY)
BASO#: 0.1 10*3/uL (ref 0.0–0.2)
Eosinophils Absolute: 0.1 10*3/uL (ref 0.0–0.5)
HGB: 13.6 g/dL (ref 13.0–17.1)
LYMPH%: 45.8 % (ref 14.0–48.0)
MCH: 29.8 pg (ref 28.0–33.4)
MCV: 91 fL (ref 82–98)
MONO%: 8.3 % (ref 0.0–13.0)
Platelets: ADEQUATE 10*3/uL (ref 145–400)
RBC: 4.56 10*6/uL (ref 4.20–5.70)

## 2010-03-28 LAB — COMPREHENSIVE METABOLIC PANEL
ALT: 29 U/L (ref 0–53)
CO2: 20 mEq/L (ref 19–32)
Calcium: 9.4 mg/dL (ref 8.4–10.5)
Chloride: 110 mEq/L (ref 96–112)
Potassium: 4 mEq/L (ref 3.5–5.3)
Sodium: 141 mEq/L (ref 135–145)
Total Protein: 7.9 g/dL (ref 6.0–8.3)

## 2010-03-28 LAB — HEMOGLOBINOPATHY EVALUATION: Hgb A: 62.1 % — ABNORMAL LOW (ref 96.8–97.8)

## 2010-03-28 LAB — RETICULOCYTES (CHCC): ABS Retic: 70.4 10*3/uL (ref 19.0–186.0)

## 2010-04-20 LAB — CBC WITH DIFFERENTIAL (CANCER CENTER ONLY)
BASO#: 0.1 10*3/uL (ref 0.0–0.2)
BASO%: 0.8 % (ref 0.0–2.0)
EOS%: 1.5 % (ref 0.0–7.0)
Eosinophils Absolute: 0.1 10*3/uL (ref 0.0–0.5)
HCT: 42.6 % (ref 38.7–49.9)
HGB: 14.3 g/dL (ref 13.0–17.1)
LYMPH#: 3.5 10*3/uL — ABNORMAL HIGH (ref 0.9–3.3)
LYMPH%: 35.9 % (ref 14.0–48.0)
MCH: 31.3 pg (ref 28.0–33.4)
MCHC: 33.6 g/dL (ref 32.0–35.9)
MCV: 93 fL (ref 82–98)
MONO#: 0.7 10*3/uL (ref 0.1–0.9)
MONO%: 7.5 % (ref 0.0–13.0)
NEUT#: 5.2 10*3/uL (ref 1.5–6.5)
NEUT%: 54.3 % (ref 40.0–80.0)
Platelets: 309 10*3/uL (ref 145–400)
RBC: 4.58 10*6/uL (ref 4.20–5.70)
RDW: 15.9 % — ABNORMAL HIGH (ref 10.5–14.6)
WBC: 9.6 10*3/uL (ref 4.0–10.0)

## 2010-04-20 LAB — TECHNOLOGIST REVIEW CHCC SATELLITE

## 2010-04-25 LAB — RETICULOCYTES (CHCC)
ABS Retic: 311.2 10*3/uL — ABNORMAL HIGH (ref 19.0–186.0)
RBC.: 4.51 MIL/uL (ref 4.22–5.81)
Retic Ct Pct: 6.9 % — ABNORMAL HIGH (ref 0.4–3.1)

## 2010-04-25 LAB — HEMOGLOBINOPATHY EVALUATION
Hemoglobin Other: 0 % (ref 0.0–0.0)
Hgb A2 Quant: 2.9 % (ref 2.2–3.2)
Hgb A: 45.4 % — ABNORMAL LOW (ref 96.8–97.8)
Hgb F Quant: 6.7 % — ABNORMAL HIGH (ref 0.0–2.0)
Hgb S Quant: 45 % — ABNORMAL HIGH (ref 0.0–0.0)

## 2010-04-25 LAB — LACTATE DEHYDROGENASE: LDH: 204 U/L (ref 94–250)

## 2010-04-25 LAB — FERRITIN: Ferritin: 1007 ng/mL — ABNORMAL HIGH (ref 22–322)

## 2010-05-22 ENCOUNTER — Other Ambulatory Visit: Payer: Self-pay | Admitting: Hematology & Oncology

## 2010-05-22 ENCOUNTER — Encounter (HOSPITAL_BASED_OUTPATIENT_CLINIC_OR_DEPARTMENT_OTHER): Payer: Medicare Other | Admitting: Hematology & Oncology

## 2010-05-22 DIAGNOSIS — D571 Sickle-cell disease without crisis: Secondary | ICD-10-CM

## 2010-05-22 DIAGNOSIS — Z23 Encounter for immunization: Secondary | ICD-10-CM

## 2010-05-22 DIAGNOSIS — M8708 Idiopathic aseptic necrosis of bone, other site: Secondary | ICD-10-CM

## 2010-05-22 LAB — CBC WITH DIFFERENTIAL (CANCER CENTER ONLY)
BASO#: 0.1 10*3/uL (ref 0.0–0.2)
BASO%: 0.7 % (ref 0.0–2.0)
EOS%: 2.1 % (ref 0.0–7.0)
HGB: 13.1 g/dL (ref 13.0–17.1)
MONO%: 8.3 % (ref 0.0–13.0)
NEUT#: 4 10*3/uL (ref 1.5–6.5)
NEUT%: 46.4 % (ref 40.0–80.0)
Platelets: 289 10*3/uL (ref 145–400)
RBC: 4.24 10*6/uL (ref 4.20–5.70)
WBC: 8.7 10*3/uL (ref 4.0–10.0)

## 2010-05-24 LAB — RETICULOCYTES (CHCC): ABS Retic: 470.8 10*3/uL — ABNORMAL HIGH (ref 19.0–186.0)

## 2010-05-24 LAB — LACTATE DEHYDROGENASE: LDH: 231 U/L (ref 94–250)

## 2010-05-24 LAB — HEMOGLOBINOPATHY EVALUATION
Hgb A: 26.7 % — ABNORMAL LOW (ref 96.8–97.8)
Hgb F Quant: 8.1 % — ABNORMAL HIGH (ref 0.0–2.0)
Hgb S Quant: 62.2 % — ABNORMAL HIGH (ref 0.0–0.0)

## 2010-05-24 LAB — HAPTOGLOBIN: Haptoglobin: <6 mg/dL — ABNORMAL LOW (ref 16–200)

## 2010-06-19 LAB — CBC
HCT: 37.9 % — ABNORMAL LOW (ref 39.0–52.0)
HCT: 39.4 % (ref 39.0–52.0)
HCT: 41 % (ref 39.0–52.0)
HCT: 42 % (ref 39.0–52.0)
Hemoglobin: 12 g/dL — ABNORMAL LOW (ref 13.0–17.0)
Hemoglobin: 14 g/dL (ref 13.0–17.0)
MCH: 30.2 pg (ref 26.0–34.0)
MCH: 30.3 pg (ref 26.0–34.0)
MCHC: 32.6 g/dL (ref 30.0–36.0)
MCHC: 32.9 g/dL (ref 30.0–36.0)
MCHC: 33.3 g/dL (ref 30.0–36.0)
MCHC: 33.8 g/dL (ref 30.0–36.0)
MCV: 93.1 fL (ref 78.0–100.0)
MCV: 93.1 fL (ref 78.0–100.0)
Platelets: 452 10*3/uL — ABNORMAL HIGH (ref 150–400)
Platelets: 509 10*3/uL — ABNORMAL HIGH (ref 150–400)
Platelets: 585 10*3/uL — ABNORMAL HIGH (ref 150–400)
RBC: 3.94 MIL/uL — ABNORMAL LOW (ref 4.22–5.81)
RBC: 4.08 MIL/uL — ABNORMAL LOW (ref 4.22–5.81)
RBC: 4.23 MIL/uL (ref 4.22–5.81)
RDW: 17.9 % — ABNORMAL HIGH (ref 11.5–15.5)
RDW: 18.7 % — ABNORMAL HIGH (ref 11.5–15.5)
RDW: 18.7 % — ABNORMAL HIGH (ref 11.5–15.5)
RDW: 18.8 % — ABNORMAL HIGH (ref 11.5–15.5)
RDW: 18.8 % — ABNORMAL HIGH (ref 11.5–15.5)
WBC: 5.3 10*3/uL (ref 4.0–10.5)
WBC: 5.5 10*3/uL (ref 4.0–10.5)
WBC: 6.9 10*3/uL (ref 4.0–10.5)

## 2010-06-19 LAB — RETICULOCYTES
RBC.: 4.08 MIL/uL — ABNORMAL LOW (ref 4.22–5.81)
RBC.: 4.15 MIL/uL — ABNORMAL LOW (ref 4.22–5.81)
RBC.: 4.23 MIL/uL (ref 4.22–5.81)
RBC.: 4.45 MIL/uL (ref 4.22–5.81)
Retic Count, Absolute: 101.9 10*3/uL (ref 19.0–186.0)
Retic Count, Absolute: 49 10*3/uL (ref 19.0–186.0)
Retic Count, Absolute: 59.2 10*3/uL (ref 19.0–186.0)
Retic Count, Absolute: 68.5 10*3/uL (ref 19.0–186.0)
Retic Count, Absolute: 97.9 10*3/uL (ref 19.0–186.0)
Retic Ct Pct: 2.2 % (ref 0.4–3.1)

## 2010-06-19 LAB — LACTATE DEHYDROGENASE
LDH: 324 U/L — ABNORMAL HIGH (ref 94–250)
LDH: 391 U/L — ABNORMAL HIGH (ref 94–250)
LDH: 453 U/L — ABNORMAL HIGH (ref 94–250)
LDH: 523 U/L — ABNORMAL HIGH (ref 94–250)

## 2010-06-19 LAB — COMPREHENSIVE METABOLIC PANEL
ALT: 121 U/L — ABNORMAL HIGH (ref 0–53)
ALT: 131 U/L — ABNORMAL HIGH (ref 0–53)
ALT: 52 U/L (ref 0–53)
ALT: 54 U/L — ABNORMAL HIGH (ref 0–53)
ALT: 81 U/L — ABNORMAL HIGH (ref 0–53)
ALT: 94 U/L — ABNORMAL HIGH (ref 0–53)
AST: 47 U/L — ABNORMAL HIGH (ref 0–37)
AST: 54 U/L — ABNORMAL HIGH (ref 0–37)
Albumin: 3 g/dL — ABNORMAL LOW (ref 3.5–5.2)
Albumin: 3 g/dL — ABNORMAL LOW (ref 3.5–5.2)
Albumin: 3.1 g/dL — ABNORMAL LOW (ref 3.5–5.2)
Alkaline Phosphatase: 300 U/L — ABNORMAL HIGH (ref 39–117)
Alkaline Phosphatase: 309 U/L — ABNORMAL HIGH (ref 39–117)
Alkaline Phosphatase: 334 U/L — ABNORMAL HIGH (ref 39–117)
Alkaline Phosphatase: 378 U/L — ABNORMAL HIGH (ref 39–117)
BUN: 14 mg/dL (ref 6–23)
BUN: 16 mg/dL (ref 6–23)
BUN: 17 mg/dL (ref 6–23)
CO2: 29 mEq/L (ref 19–32)
CO2: 29 mEq/L (ref 19–32)
Calcium: 10 mg/dL (ref 8.4–10.5)
Calcium: 9.4 mg/dL (ref 8.4–10.5)
Calcium: 9.8 mg/dL (ref 8.4–10.5)
Calcium: 9.8 mg/dL (ref 8.4–10.5)
Chloride: 102 mEq/L (ref 96–112)
Chloride: 103 mEq/L (ref 96–112)
Creatinine, Ser: 0.92 mg/dL (ref 0.4–1.5)
Creatinine, Ser: 0.99 mg/dL (ref 0.4–1.5)
GFR calc Af Amer: >60 mL/min (ref >60)
GFR calc Af Amer: >60 mL/min (ref >60)
GFR calc Af Amer: >60 mL/min (ref >60)
GFR calc Af Amer: >60 mL/min (ref >60)
GFR calc non Af Amer: >60 mL/min (ref >60)
GFR calc non Af Amer: >60 mL/min (ref >60)
GFR calc non Af Amer: >60 mL/min (ref >60)
Glucose, Bld: 107 mg/dL — ABNORMAL HIGH (ref 70–99)
Glucose, Bld: 89 mg/dL (ref 70–99)
Glucose, Bld: 98 mg/dL (ref 70–99)
Potassium: 4.5 mEq/L (ref 3.5–5.1)
Potassium: 4.9 mEq/L (ref 3.5–5.1)
Potassium: 5 mEq/L (ref 3.5–5.1)
Sodium: 135 mEq/L (ref 135–145)
Sodium: 137 mEq/L (ref 135–145)
Sodium: 138 mEq/L (ref 135–145)
Sodium: 138 mEq/L (ref 135–145)
Sodium: 138 mEq/L (ref 135–145)
Sodium: 139 mEq/L (ref 135–145)
Sodium: 139 mEq/L (ref 135–145)
Total Bilirubin: 0.9 mg/dL (ref 0.3–1.2)
Total Bilirubin: 1.3 mg/dL — ABNORMAL HIGH (ref 0.3–1.2)
Total Bilirubin: 1.6 mg/dL — ABNORMAL HIGH (ref 0.3–1.2)
Total Protein: 7.5 g/dL (ref 6.0–8.3)
Total Protein: 7.5 g/dL (ref 6.0–8.3)
Total Protein: 7.7 g/dL (ref 6.0–8.3)
Total Protein: 7.7 g/dL (ref 6.0–8.3)
Total Protein: 7.8 g/dL (ref 6.0–8.3)

## 2010-06-19 LAB — DIFFERENTIAL
Basophils Absolute: 0.1 10*3/uL (ref 0.0–0.1)
Basophils Relative: 1 % (ref 0–1)
Basophils Relative: 2 % — ABNORMAL HIGH (ref 0–1)
Basophils Relative: 2 % — ABNORMAL HIGH (ref 0–1)
Eosinophils Absolute: 0.2 10*3/uL (ref 0.0–0.7)
Eosinophils Relative: 3 % (ref 0–5)
Eosinophils Relative: 3 % (ref 0–5)
Eosinophils Relative: 4 % (ref 0–5)
Lymphocytes Relative: 45 % (ref 12–46)
Lymphs Abs: 2.5 10*3/uL (ref 0.7–4.0)
Monocytes Absolute: 0.8 10*3/uL (ref 0.1–1.0)
Monocytes Relative: 13 % — ABNORMAL HIGH (ref 3–12)
Monocytes Relative: 16 % — ABNORMAL HIGH (ref 3–12)
Neutro Abs: 1.9 10*3/uL (ref 1.7–7.7)
Neutro Abs: 2 10*3/uL (ref 1.7–7.7)

## 2010-06-19 LAB — GLUCOSE, CAPILLARY: Glucose-Capillary: 131 mg/dL — ABNORMAL HIGH (ref 70–99)

## 2010-06-19 LAB — HEMOGLOBINOPATHY EVALUATION
Hgb A2 Quant: 2.8 % (ref 2.2–3.2)
Hgb A: 63.2 % — ABNORMAL LOW (ref 96.8–97.8)
Hgb S Quant: 28.9 % — ABNORMAL HIGH (ref 0.0–0.0)

## 2010-06-20 LAB — COMPREHENSIVE METABOLIC PANEL
ALT: 101 U/L — ABNORMAL HIGH (ref 0–53)
ALT: 101 U/L — ABNORMAL HIGH (ref 0–53)
ALT: 156 U/L — ABNORMAL HIGH (ref 0–53)
ALT: 39 U/L (ref 0–53)
ALT: 49 U/L (ref 0–53)
ALT: 51 U/L (ref 0–53)
ALT: 54 U/L — ABNORMAL HIGH (ref 0–53)
ALT: 66 U/L — ABNORMAL HIGH (ref 0–53)
AST: 106 U/L — ABNORMAL HIGH (ref 0–37)
AST: 387 U/L — ABNORMAL HIGH (ref 0–37)
AST: 46 U/L — ABNORMAL HIGH (ref 0–37)
AST: 48 U/L — ABNORMAL HIGH (ref 0–37)
AST: 53 U/L — ABNORMAL HIGH (ref 0–37)
AST: 88 U/L — ABNORMAL HIGH (ref 0–37)
AST: 94 U/L — ABNORMAL HIGH (ref 0–37)
Albumin: 2.9 g/dL — ABNORMAL LOW (ref 3.5–5.2)
Albumin: 3.1 g/dL — ABNORMAL LOW (ref 3.5–5.2)
Albumin: 3.1 g/dL — ABNORMAL LOW (ref 3.5–5.2)
Albumin: 3.4 g/dL — ABNORMAL LOW (ref 3.5–5.2)
Albumin: 3.5 g/dL (ref 3.5–5.2)
Albumin: 3.6 g/dL (ref 3.5–5.2)
Alkaline Phosphatase: 147 U/L — ABNORMAL HIGH (ref 39–117)
Alkaline Phosphatase: 154 U/L — ABNORMAL HIGH (ref 39–117)
Alkaline Phosphatase: 439 U/L — ABNORMAL HIGH (ref 39–117)
Alkaline Phosphatase: 448 U/L — ABNORMAL HIGH (ref 39–117)
Alkaline Phosphatase: 77 U/L (ref 39–117)
BUN: 10 mg/dL (ref 6–23)
BUN: 11 mg/dL (ref 6–23)
BUN: 11 mg/dL (ref 6–23)
BUN: 19 mg/dL (ref 6–23)
CO2: 20 mEq/L (ref 19–32)
CO2: 22 mEq/L (ref 19–32)
CO2: 24 mEq/L (ref 19–32)
CO2: 27 mEq/L (ref 19–32)
Calcium: 8 mg/dL — ABNORMAL LOW (ref 8.4–10.5)
Calcium: 8.6 mg/dL (ref 8.4–10.5)
Calcium: 8.6 mg/dL (ref 8.4–10.5)
Calcium: 8.7 mg/dL (ref 8.4–10.5)
Calcium: 9.2 mg/dL (ref 8.4–10.5)
Calcium: 9.4 mg/dL (ref 8.4–10.5)
Chloride: 101 mEq/L (ref 96–112)
Chloride: 106 mEq/L (ref 96–112)
Chloride: 108 mEq/L (ref 96–112)
Chloride: 112 mEq/L (ref 96–112)
Creatinine, Ser: 0.77 mg/dL (ref 0.4–1.5)
Creatinine, Ser: 0.91 mg/dL (ref 0.4–1.5)
Creatinine, Ser: 0.93 mg/dL (ref 0.4–1.5)
Creatinine, Ser: 1.4 mg/dL (ref 0.4–1.5)
Creatinine, Ser: 1.6 mg/dL — ABNORMAL HIGH (ref 0.4–1.5)
GFR calc Af Amer: >60 mL/min (ref >60)
GFR calc Af Amer: >60 mL/min (ref >60)
GFR calc Af Amer: >60 mL/min (ref >60)
GFR calc Af Amer: >60 mL/min (ref >60)
GFR calc Af Amer: >60 mL/min (ref >60)
GFR calc Af Amer: >60 mL/min (ref >60)
GFR calc Af Amer: >60 mL/min (ref >60)
GFR calc Af Amer: >60 mL/min (ref >60)
GFR calc non Af Amer: 51 mL/min — ABNORMAL LOW (ref >60)
GFR calc non Af Amer: 60 mL/min — ABNORMAL LOW (ref >60)
GFR calc non Af Amer: >60 mL/min (ref >60)
Glucose, Bld: 105 mg/dL — ABNORMAL HIGH (ref 70–99)
Glucose, Bld: 108 mg/dL — ABNORMAL HIGH (ref 70–99)
Glucose, Bld: 113 mg/dL — ABNORMAL HIGH (ref 70–99)
Glucose, Bld: 116 mg/dL — ABNORMAL HIGH (ref 70–99)
Glucose, Bld: 127 mg/dL — ABNORMAL HIGH (ref 70–99)
Glucose, Bld: 138 mg/dL — ABNORMAL HIGH (ref 70–99)
Glucose, Bld: 177 mg/dL — ABNORMAL HIGH (ref 70–99)
Glucose, Bld: 89 mg/dL (ref 70–99)
Glucose, Bld: 93 mg/dL (ref 70–99)
Potassium: 3.8 mEq/L (ref 3.5–5.1)
Potassium: 4.3 mEq/L (ref 3.5–5.1)
Potassium: 4.5 mEq/L (ref 3.5–5.1)
Potassium: 4.5 mEq/L (ref 3.5–5.1)
Potassium: 4.5 mEq/L (ref 3.5–5.1)
Potassium: 4.6 mEq/L (ref 3.5–5.1)
Sodium: 137 mEq/L (ref 135–145)
Sodium: 138 mEq/L (ref 135–145)
Sodium: 139 mEq/L (ref 135–145)
Sodium: 141 mEq/L (ref 135–145)
Sodium: 141 mEq/L (ref 135–145)
Sodium: 141 mEq/L (ref 135–145)
Sodium: 141 mEq/L (ref 135–145)
Sodium: 141 mEq/L (ref 135–145)
Sodium: 143 mEq/L (ref 135–145)
Total Bilirubin: 1.5 mg/dL — ABNORMAL HIGH (ref 0.3–1.2)
Total Bilirubin: 3.2 mg/dL — ABNORMAL HIGH (ref 0.3–1.2)
Total Bilirubin: 3.4 mg/dL — ABNORMAL HIGH (ref 0.3–1.2)
Total Bilirubin: 4.8 mg/dL — ABNORMAL HIGH (ref 0.3–1.2)
Total Protein: 5.9 g/dL — ABNORMAL LOW (ref 6.0–8.3)
Total Protein: 6.4 g/dL (ref 6.0–8.3)
Total Protein: 6.5 g/dL (ref 6.0–8.3)
Total Protein: 6.5 g/dL (ref 6.0–8.3)
Total Protein: 6.8 g/dL (ref 6.0–8.3)
Total Protein: 7.7 g/dL (ref 6.0–8.3)
Total Protein: 7.9 g/dL (ref 6.0–8.3)

## 2010-06-20 LAB — CULTURE, BLOOD (ROUTINE X 2)
Culture  Setup Time: 201111170035
Culture  Setup Time: 201111170035
Culture  Setup Time: 201111230528
Culture  Setup Time: 201111261724
Culture: NO GROWTH
Culture: NO GROWTH
Culture: NO GROWTH
Culture: NO GROWTH

## 2010-06-20 LAB — AMMONIA: Ammonia: 56 umol/L — ABNORMAL HIGH (ref 11–35)

## 2010-06-20 LAB — CROSSMATCH
ABO/RH(D): O POS
Antibody Screen: POSITIVE
DAT, IgG: NEGATIVE
Unit division: 0
Unit division: 0
Unit division: 0
Unit division: 0
Unit division: 0

## 2010-06-20 LAB — BASIC METABOLIC PANEL
BUN: 10 mg/dL (ref 6–23)
CO2: 24 mEq/L (ref 19–32)
Calcium: 9.1 mg/dL (ref 8.4–10.5)
Chloride: 109 mEq/L (ref 96–112)
Creatinine, Ser: 1.1 mg/dL (ref 0.4–1.5)
GFR calc non Af Amer: >60 mL/min (ref >60)
GFR calc non Af Amer: >60 mL/min (ref >60)
Glucose, Bld: 114 mg/dL — ABNORMAL HIGH (ref 70–99)
Glucose, Bld: 90 mg/dL (ref 70–99)
Potassium: 3.6 mEq/L (ref 3.5–5.1)
Potassium: 4.1 mEq/L (ref 3.5–5.1)
Sodium: 139 mEq/L (ref 135–145)
Sodium: 141 mEq/L (ref 135–145)

## 2010-06-20 LAB — RETICULOCYTES
RBC.: 3.07 MIL/uL — ABNORMAL LOW (ref 4.22–5.81)
RBC.: 4.02 MIL/uL — ABNORMAL LOW (ref 4.22–5.81)
RBC.: 4.16 MIL/uL — ABNORMAL LOW (ref 4.22–5.81)
RBC.: 4.33 MIL/uL (ref 4.22–5.81)
RBC.: 4.37 MIL/uL (ref 4.22–5.81)
Retic Count, Absolute: 153 10*3/uL (ref 19.0–186.0)
Retic Count, Absolute: 177.5 10*3/uL (ref 19.0–186.0)
Retic Count, Absolute: 214.1 10*3/uL — ABNORMAL HIGH (ref 19.0–186.0)
Retic Count, Absolute: 250.9 10*3/uL — ABNORMAL HIGH (ref 19.0–186.0)
Retic Count, Absolute: 327.2 10*3/uL — ABNORMAL HIGH (ref 19.0–186.0)
Retic Count, Absolute: 442.2 10*3/uL — ABNORMAL HIGH (ref 19.0–186.0)
Retic Ct Pct: 3 % (ref 0.4–3.1)
Retic Ct Pct: 5.5 % — ABNORMAL HIGH (ref 0.4–3.1)
Retic Ct Pct: 9.8 % — ABNORMAL HIGH (ref 0.4–3.1)

## 2010-06-20 LAB — BLOOD GAS, ARTERIAL
Acid-base deficit: 2 mmol/L (ref 0.0–2.0)
Acid-base deficit: 4.3 mmol/L — ABNORMAL HIGH (ref 0.0–2.0)
Drawn by: 257701
Drawn by: 330981
Drawn by: 33686
FIO2: 0.35 %
FIO2: 1 %
FIO2: 1 %
O2 Saturation: 100.7 %
Patient temperature: 101
Patient temperature: 98.6
TCO2: 21.5 mmol/L (ref 0–100)
pCO2 arterial: 42.5 mmHg (ref 35.0–45.0)
pCO2 arterial: 47.4 mmHg — ABNORMAL HIGH (ref 35.0–45.0)
pH, Arterial: 7.267 — ABNORMAL LOW (ref 7.350–7.450)
pH, Arterial: 7.351 (ref 7.350–7.450)
pO2, Arterial: 165 mmHg — ABNORMAL HIGH (ref 80.0–100.0)
pO2, Arterial: 199 mmHg — ABNORMAL HIGH (ref 80.0–100.0)

## 2010-06-20 LAB — URINALYSIS, MICROSCOPIC ONLY
Glucose, UA: NEGATIVE mg/dL
Leukocytes, UA: NEGATIVE
Protein, ur: NEGATIVE mg/dL
Urobilinogen, UA: 0.2 mg/dL (ref 0.0–1.0)

## 2010-06-20 LAB — POCT CARDIAC MARKERS
Myoglobin, poc: 96.1 ng/mL (ref 12–200)
Troponin i, poc: <0.05 ng/mL (ref 0.00–0.09)

## 2010-06-20 LAB — DIFFERENTIAL
Basophils Absolute: 0 10*3/uL (ref 0.0–0.1)
Basophils Absolute: 0 10*3/uL (ref 0.0–0.1)
Basophils Absolute: 0.1 10*3/uL (ref 0.0–0.1)
Basophils Absolute: 0.1 10*3/uL (ref 0.0–0.1)
Basophils Absolute: 0.3 10*3/uL — ABNORMAL HIGH (ref 0.0–0.1)
Basophils Absolute: 0.4 10*3/uL — ABNORMAL HIGH (ref 0.0–0.1)
Basophils Relative: 0 % (ref 0–1)
Basophils Relative: 0 % (ref 0–1)
Basophils Relative: 1 % (ref 0–1)
Basophils Relative: 3 % — ABNORMAL HIGH (ref 0–1)
Eosinophils Absolute: 0 10*3/uL (ref 0.0–0.7)
Eosinophils Absolute: 0.1 10*3/uL (ref 0.0–0.7)
Eosinophils Absolute: 0.6 10*3/uL (ref 0.0–0.7)
Eosinophils Relative: 0 % (ref 0–5)
Eosinophils Relative: 4 % (ref 0–5)
Lymphocytes Relative: 10 % — ABNORMAL LOW (ref 12–46)
Lymphocytes Relative: 15 % (ref 12–46)
Lymphocytes Relative: 16 % (ref 12–46)
Lymphocytes Relative: 21 % (ref 12–46)
Lymphocytes Relative: 24 % (ref 12–46)
Lymphs Abs: 1 10*3/uL (ref 0.7–4.0)
Lymphs Abs: 1.4 10*3/uL (ref 0.7–4.0)
Lymphs Abs: 3.4 10*3/uL (ref 0.7–4.0)
Monocytes Relative: 10 % (ref 3–12)
Monocytes Relative: 12 % (ref 3–12)
Monocytes Relative: 6 % (ref 3–12)
Monocytes Relative: 8 % (ref 3–12)
Monocytes Relative: 9 % (ref 3–12)
Neutro Abs: 4 10*3/uL (ref 1.7–7.7)
Neutro Abs: 7.4 10*3/uL (ref 1.7–7.7)
Neutro Abs: 7.9 10*3/uL — ABNORMAL HIGH (ref 1.7–7.7)
Neutrophils Relative %: 70 % (ref 43–77)
Neutrophils Relative %: 73 % (ref 43–77)
Neutrophils Relative %: 74 % (ref 43–77)

## 2010-06-20 LAB — CBC
HCT: 24.1 % — ABNORMAL LOW (ref 39.0–52.0)
HCT: 24.9 % — ABNORMAL LOW (ref 39.0–52.0)
HCT: 26.4 % — ABNORMAL LOW (ref 39.0–52.0)
HCT: 27.9 % — ABNORMAL LOW (ref 39.0–52.0)
HCT: 28 % — ABNORMAL LOW (ref 39.0–52.0)
HCT: 28.4 % — ABNORMAL LOW (ref 39.0–52.0)
HCT: 31.8 % — ABNORMAL LOW (ref 39.0–52.0)
HCT: 32.6 % — ABNORMAL LOW (ref 39.0–52.0)
HCT: 34.5 % — ABNORMAL LOW (ref 39.0–52.0)
HCT: 38 % — ABNORMAL LOW (ref 39.0–52.0)
HCT: 38.8 % — ABNORMAL LOW (ref 39.0–52.0)
HCT: 40.2 % (ref 39.0–52.0)
Hemoglobin: 10 g/dL — ABNORMAL LOW (ref 13.0–17.0)
Hemoglobin: 10.1 g/dL — ABNORMAL LOW (ref 13.0–17.0)
Hemoglobin: 11.1 g/dL — ABNORMAL LOW (ref 13.0–17.0)
Hemoglobin: 11.2 g/dL — ABNORMAL LOW (ref 13.0–17.0)
Hemoglobin: 12.8 g/dL — ABNORMAL LOW (ref 13.0–17.0)
Hemoglobin: 12.9 g/dL — ABNORMAL LOW (ref 13.0–17.0)
Hemoglobin: 7.1 g/dL — ABNORMAL LOW (ref 13.0–17.0)
Hemoglobin: 8.5 g/dL — ABNORMAL LOW (ref 13.0–17.0)
MCH: 28.9 pg (ref 26.0–34.0)
MCH: 31.7 pg (ref 26.0–34.0)
MCH: 31.7 pg (ref 26.0–34.0)
MCH: 31.8 pg (ref 26.0–34.0)
MCH: 31.9 pg (ref 26.0–34.0)
MCHC: 34.4 g/dL (ref 30.0–36.0)
MCHC: 34.5 g/dL (ref 30.0–36.0)
MCHC: 34.8 g/dL (ref 30.0–36.0)
MCHC: 34.8 g/dL (ref 30.0–36.0)
MCHC: 34.8 g/dL (ref 30.0–36.0)
MCHC: 35.3 g/dL (ref 30.0–36.0)
MCHC: 35.4 g/dL (ref 30.0–36.0)
MCHC: 35.6 g/dL (ref 30.0–36.0)
MCV: 83 fL (ref 78.0–100.0)
MCV: 84 fL (ref 78.0–100.0)
MCV: 89.3 fL (ref 78.0–100.0)
MCV: 89.8 fL (ref 78.0–100.0)
MCV: 91.1 fL (ref 78.0–100.0)
MCV: 91.7 fL (ref 78.0–100.0)
MCV: 92.9 fL (ref 78.0–100.0)
Platelets: 101 10*3/uL — ABNORMAL LOW (ref 150–400)
Platelets: 130 10*3/uL — ABNORMAL LOW (ref 150–400)
Platelets: 150 10*3/uL (ref 150–400)
Platelets: 164 10*3/uL (ref 150–400)
Platelets: 198 10*3/uL (ref 150–400)
Platelets: 223 10*3/uL (ref 150–400)
Platelets: 265 10*3/uL (ref 150–400)
Platelets: 267 10*3/uL (ref 150–400)
RBC: 2.46 MIL/uL — ABNORMAL LOW (ref 4.22–5.81)
RBC: 2.95 MIL/uL — ABNORMAL LOW (ref 4.22–5.81)
RBC: 3.13 MIL/uL — ABNORMAL LOW (ref 4.22–5.81)
RBC: 3.21 MIL/uL — ABNORMAL LOW (ref 4.22–5.81)
RBC: 3.32 MIL/uL — ABNORMAL LOW (ref 4.22–5.81)
RBC: 4.05 MIL/uL — ABNORMAL LOW (ref 4.22–5.81)
RBC: 4.17 MIL/uL — ABNORMAL LOW (ref 4.22–5.81)
RBC: 4.17 MIL/uL — ABNORMAL LOW (ref 4.22–5.81)
RBC: 4.26 MIL/uL (ref 4.22–5.81)
RDW: 19 % — ABNORMAL HIGH (ref 11.5–15.5)
RDW: 21.7 % — ABNORMAL HIGH (ref 11.5–15.5)
RDW: 24.5 % — ABNORMAL HIGH (ref 11.5–15.5)
RDW: 25.1 % — ABNORMAL HIGH (ref 11.5–15.5)
RDW: 25.2 % — ABNORMAL HIGH (ref 11.5–15.5)
RDW: 26 % — ABNORMAL HIGH (ref 11.5–15.5)
RDW: 26.5 % — ABNORMAL HIGH (ref 11.5–15.5)
RDW: 34.4 % — ABNORMAL HIGH (ref 11.5–15.5)
RDW: 36.7 % — ABNORMAL HIGH (ref 11.5–15.5)
WBC: 10 10*3/uL (ref 4.0–10.5)
WBC: 10 10*3/uL (ref 4.0–10.5)
WBC: 10.4 10*3/uL (ref 4.0–10.5)
WBC: 11.4 10*3/uL — ABNORMAL HIGH (ref 4.0–10.5)
WBC: 12.5 10*3/uL — ABNORMAL HIGH (ref 4.0–10.5)
WBC: 13.2 10*3/uL — ABNORMAL HIGH (ref 4.0–10.5)
WBC: 20.5 10*3/uL — ABNORMAL HIGH (ref 4.0–10.5)
WBC: 21.5 10*3/uL — ABNORMAL HIGH (ref 4.0–10.5)
WBC: 5.9 10*3/uL (ref 4.0–10.5)
WBC: 6.8 10*3/uL (ref 4.0–10.5)
WBC: 9.4 10*3/uL (ref 4.0–10.5)
WBC: 9.7 10*3/uL (ref 4.0–10.5)
WBC: 9.9 10*3/uL (ref 4.0–10.5)

## 2010-06-20 LAB — HEPATITIS PANEL, ACUTE
HCV Ab: NEGATIVE
Hep A IgM: NEGATIVE
Hep B C IgM: NEGATIVE
Hepatitis B Surface Ag: NEGATIVE

## 2010-06-20 LAB — HEPATIC FUNCTION PANEL
AST: 53 U/L — ABNORMAL HIGH (ref 0–37)
Albumin: 2.8 g/dL — ABNORMAL LOW (ref 3.5–5.2)
Albumin: 3.8 g/dL (ref 3.5–5.2)
Alkaline Phosphatase: 141 U/L — ABNORMAL HIGH (ref 39–117)
Bilirubin, Direct: 0.7 mg/dL — ABNORMAL HIGH (ref 0.0–0.3)
Bilirubin, Direct: 0.9 mg/dL — ABNORMAL HIGH (ref 0.0–0.3)
Total Bilirubin: 2.9 mg/dL — ABNORMAL HIGH (ref 0.3–1.2)

## 2010-06-20 LAB — LACTATE DEHYDROGENASE
LDH: 528 U/L — ABNORMAL HIGH (ref 94–250)
LDH: 916 U/L — ABNORMAL HIGH (ref 94–250)

## 2010-06-20 LAB — HEMOGLOBINOPATHY EVALUATION: Hemoglobin Other: 0 % (ref 0.0–0.0)

## 2010-06-20 LAB — FERRITIN: Ferritin: 434 ng/mL — ABNORMAL HIGH (ref 22–322)

## 2010-06-20 LAB — URINALYSIS, ROUTINE W REFLEX MICROSCOPIC
Glucose, UA: NEGATIVE mg/dL
Nitrite: NEGATIVE
Protein, ur: NEGATIVE mg/dL
pH: 7 (ref 5.0–8.0)

## 2010-06-20 LAB — URINE CULTURE
Colony Count: NO GROWTH
Colony Count: NO GROWTH
Culture: NO GROWTH

## 2010-06-20 LAB — PROTEIN ELECTROPH W RFLX QUANT IMMUNOGLOBULINS
Albumin ELP: 53.7 % — ABNORMAL LOW (ref 55.8–66.1)
Alpha-1-Globulin: 5.3 % — ABNORMAL HIGH (ref 2.9–4.9)
Total Protein ELP: 6.4 g/dL (ref 6.0–8.3)

## 2010-06-20 LAB — RAPID URINE DRUG SCREEN, HOSP PERFORMED
Amphetamines: NOT DETECTED
Opiates: POSITIVE — AB
Tetrahydrocannabinol: NOT DETECTED

## 2010-06-20 LAB — MRSA PCR SCREENING: MRSA by PCR: POSITIVE — AB

## 2010-06-20 LAB — LACTIC ACID, PLASMA: Lactic Acid, Venous: 3.3 mmol/L — ABNORMAL HIGH (ref 0.5–2.2)

## 2010-06-20 LAB — PROCALCITONIN: Procalcitonin: 0.87 ng/mL

## 2010-06-20 LAB — PREPARE RBC (CROSSMATCH)

## 2010-06-20 LAB — HEMOGLOBIN AND HEMATOCRIT, BLOOD
HCT: 29.3 % — ABNORMAL LOW (ref 39.0–52.0)
Hemoglobin: 10.4 g/dL — ABNORMAL LOW (ref 13.0–17.0)

## 2010-06-20 LAB — VANCOMYCIN, TROUGH
Vancomycin Tr: 22.3 ug/mL — ABNORMAL HIGH (ref 10.0–20.0)
Vancomycin Tr: 9.6 ug/mL — ABNORMAL LOW (ref 10.0–20.0)

## 2010-06-20 LAB — GENTAMICIN LEVEL, RANDOM: Gentamicin Rm: 0.9 ug/mL

## 2010-07-08 ENCOUNTER — Inpatient Hospital Stay (HOSPITAL_COMMUNITY)
Admission: EM | Admit: 2010-07-08 | Discharge: 2010-07-19 | DRG: 812 | Disposition: A | Discharge status: Home / Self Care | Payer: Medicare Other | Attending: Internal Medicine | Admitting: Internal Medicine

## 2010-07-08 ENCOUNTER — Emergency Department (HOSPITAL_COMMUNITY): Payer: Medicare Other

## 2010-07-08 DIAGNOSIS — I2489 Other forms of acute ischemic heart disease: Secondary | ICD-10-CM | POA: Diagnosis present

## 2010-07-08 DIAGNOSIS — Y844 Aspiration of fluid as the cause of abnormal reaction of the patient, or of later complication, without mention of misadventure at the time of the procedure: Secondary | ICD-10-CM | POA: Diagnosis not present

## 2010-07-08 DIAGNOSIS — R0902 Hypoxemia: Secondary | ICD-10-CM | POA: Diagnosis present

## 2010-07-08 DIAGNOSIS — G971 Other reaction to spinal and lumbar puncture: Secondary | ICD-10-CM | POA: Diagnosis not present

## 2010-07-08 DIAGNOSIS — Z8613 Personal history of malaria: Secondary | ICD-10-CM

## 2010-07-08 DIAGNOSIS — R51 Headache: Secondary | ICD-10-CM | POA: Diagnosis present

## 2010-07-08 DIAGNOSIS — I248 Other forms of acute ischemic heart disease: Secondary | ICD-10-CM | POA: Diagnosis present

## 2010-07-08 DIAGNOSIS — Y9210 Unspecified residential institution as the place of occurrence of the external cause: Secondary | ICD-10-CM | POA: Diagnosis present

## 2010-07-08 DIAGNOSIS — D5700 Hb-SS disease with crisis, unspecified: Principal | ICD-10-CM | POA: Diagnosis present

## 2010-07-08 DIAGNOSIS — R7611 Nonspecific reaction to tuberculin skin test without active tuberculosis: Secondary | ICD-10-CM | POA: Diagnosis present

## 2010-07-08 LAB — URINALYSIS, ROUTINE W REFLEX MICROSCOPIC
Bilirubin Urine: NEGATIVE
Hgb urine dipstick: NEGATIVE
Ketones, ur: NEGATIVE mg/dL
Protein, ur: NEGATIVE mg/dL
Urobilinogen, UA: 1 mg/dL (ref 0.0–1.0)

## 2010-07-08 LAB — DIFFERENTIAL
Basophils Relative: 1 % (ref 0–1)
Eosinophils Absolute: 0.3 10*3/uL (ref 0.0–0.7)
Eosinophils Relative: 3 % (ref 0–5)
Lymphocytes Relative: 45 % (ref 12–46)
Monocytes Absolute: 1.3 10*3/uL — ABNORMAL HIGH (ref 0.1–1.0)
Neutro Abs: 4.6 10*3/uL (ref 1.7–7.7)

## 2010-07-08 LAB — BASIC METABOLIC PANEL
BUN: 8 mg/dL (ref 6–23)
CO2: 23 mEq/L (ref 19–32)
CO2: 24 mEq/L (ref 19–32)
Calcium: 9.5 mg/dL (ref 8.4–10.5)
Chloride: 108 mEq/L (ref 96–112)
Creatinine, Ser: 0.86 mg/dL (ref 0.4–1.5)
GFR calc non Af Amer: >60 mL/min (ref >60)
Glucose, Bld: 92 mg/dL (ref 70–99)
Glucose, Bld: 172 mg/dL — ABNORMAL HIGH (ref 70–99)
Potassium: 4.1 mEq/L (ref 3.5–5.1)

## 2010-07-08 LAB — BLOOD GAS, ARTERIAL
Acid-base deficit: 1.6 mmol/L (ref 0.0–2.0)
Bicarbonate: 23.6 mEq/L (ref 20.0–24.0)
O2 Saturation: 83.7 %
Patient temperature: 98.6
TCO2: 22.2 mmol/L (ref 0–100)

## 2010-07-08 LAB — CBC
HCT: 33.9 % — ABNORMAL LOW (ref 39.0–52.0)
Hemoglobin: 12.1 g/dL — ABNORMAL LOW (ref 13.0–17.0)
MCH: 31.6 pg (ref 26.0–34.0)
MCHC: 35.7 g/dL (ref 30.0–36.0)
RDW: 22.8 % — ABNORMAL HIGH (ref 11.5–15.5)

## 2010-07-08 LAB — MRSA PCR SCREENING: MRSA by PCR: POSITIVE — AB

## 2010-07-08 MED ORDER — IOHEXOL 300 MG/ML  SOLN
100.0000 mL | Freq: Once | INTRAMUSCULAR | Status: AC | PRN
  Administered 2010-07-08: 100 mL via INTRAVENOUS

## 2010-07-09 LAB — URINE CULTURE
Culture  Setup Time: 201203311343
Culture: NO GROWTH

## 2010-07-09 LAB — RETICULOCYTES: Retic Count, Absolute: 653.2 10*3/uL — ABNORMAL HIGH (ref 19.0–186.0)

## 2010-07-09 LAB — CARDIAC PANEL(CRET KIN+CKTOT+MB+TROPI)
Relative Index: 0.2 (ref 0.0–2.5)
Total CK: 2001 U/L — ABNORMAL HIGH (ref 7–232)
Troponin I: 0.24 ng/mL — ABNORMAL HIGH (ref 0.00–0.06)

## 2010-07-10 ENCOUNTER — Inpatient Hospital Stay (HOSPITAL_COMMUNITY): Payer: Medicare Other

## 2010-07-10 DIAGNOSIS — I059 Rheumatic mitral valve disease, unspecified: Secondary | ICD-10-CM

## 2010-07-10 DIAGNOSIS — R7989 Other specified abnormal findings of blood chemistry: Secondary | ICD-10-CM

## 2010-07-10 DIAGNOSIS — I10 Essential (primary) hypertension: Secondary | ICD-10-CM

## 2010-07-10 LAB — COMPREHENSIVE METABOLIC PANEL
CO2: 24 mEq/L (ref 19–32)
Calcium: 9.1 mg/dL (ref 8.4–10.5)
Creatinine, Ser: 1.06 mg/dL (ref 0.4–1.5)
GFR calc non Af Amer: >60 mL/min (ref >60)
Glucose, Bld: 97 mg/dL (ref 70–99)

## 2010-07-10 LAB — CARDIAC PANEL(CRET KIN+CKTOT+MB+TROPI)
CK, MB: 7.4 ng/mL (ref 0.3–4.0)
Relative Index: 0.5 (ref 0.0–2.5)
Troponin I: 0.07 ng/mL — ABNORMAL HIGH (ref 0.00–0.06)

## 2010-07-10 LAB — CSF CELL COUNT WITH DIFFERENTIAL

## 2010-07-10 LAB — CBC
MCH: 30 pg (ref 26.0–34.0)
MCHC: 35.6 g/dL (ref 30.0–36.0)
Platelets: 226 10*3/uL (ref 150–400)
RBC: 3.1 MIL/uL — ABNORMAL LOW (ref 4.22–5.81)

## 2010-07-10 LAB — RETICULOCYTES: Retic Ct Pct: 18.5 % — ABNORMAL HIGH (ref 0.4–3.1)

## 2010-07-10 LAB — TSH: TSH: 0.591 u[IU]/mL (ref 0.350–4.500)

## 2010-07-10 LAB — PROTEIN, CSF: Total  Protein, CSF: 49 mg/dL — ABNORMAL HIGH (ref 15–45)

## 2010-07-10 LAB — LIPID PANEL: VLDL: 17 mg/dL (ref 0–40)

## 2010-07-10 LAB — GLUCOSE, CSF: Glucose, CSF: 70 mg/dL (ref 43–76)

## 2010-07-11 ENCOUNTER — Inpatient Hospital Stay (HOSPITAL_COMMUNITY): Payer: Medicare Other

## 2010-07-11 DIAGNOSIS — R079 Chest pain, unspecified: Secondary | ICD-10-CM

## 2010-07-11 LAB — COMPREHENSIVE METABOLIC PANEL
ALT: 34 U/L (ref 0–53)
AST: 61 U/L — ABNORMAL HIGH (ref 0–37)
Albumin: 3.6 g/dL (ref 3.5–5.2)
CO2: 31 mEq/L (ref 19–32)
Calcium: 9.2 mg/dL (ref 8.4–10.5)
GFR calc Af Amer: >60 mL/min (ref >60)
GFR calc non Af Amer: >60 mL/min (ref >60)
Sodium: 140 mEq/L (ref 135–145)
Total Protein: 6.7 g/dL (ref 6.0–8.3)

## 2010-07-11 LAB — RETICULOCYTES
Retic Count, Absolute: 582.8 10*3/uL — ABNORMAL HIGH (ref 19.0–186.0)
Retic Ct Pct: 18.1 % — ABNORMAL HIGH (ref 0.4–3.1)

## 2010-07-11 LAB — CBC
Hemoglobin: 10.1 g/dL — ABNORMAL LOW (ref 13.0–17.0)
MCH: 31.4 pg (ref 26.0–34.0)
RBC: 3.22 MIL/uL — ABNORMAL LOW (ref 4.22–5.81)

## 2010-07-11 LAB — HERPES SIMPLEX VIRUS(HSV) DNA BY PCR: HSV 1 DNA: NOT DETECTED

## 2010-07-11 LAB — BRAIN NATRIURETIC PEPTIDE: Pro B Natriuretic peptide (BNP): 112 pg/mL — ABNORMAL HIGH (ref 0.0–100.0)

## 2010-07-12 LAB — BASIC METABOLIC PANEL
CO2: 34 mEq/L — ABNORMAL HIGH (ref 19–32)
Calcium: 8.6 mg/dL (ref 8.4–10.5)
Chloride: 105 mEq/L (ref 96–112)
GFR calc Af Amer: >60 mL/min (ref >60)
Glucose, Bld: 102 mg/dL — ABNORMAL HIGH (ref 70–99)
Sodium: 143 mEq/L (ref 135–145)

## 2010-07-12 LAB — CBC
HCT: 30.8 % — ABNORMAL LOW (ref 39.0–52.0)
Hemoglobin: 11.1 g/dL — ABNORMAL LOW (ref 13.0–17.0)
MCH: 30.9 pg (ref 26.0–34.0)
MCHC: 36 g/dL (ref 30.0–36.0)
RDW: 20.2 % — ABNORMAL HIGH (ref 11.5–15.5)

## 2010-07-12 LAB — RETICULOCYTES: RBC.: 3.59 MIL/uL — ABNORMAL LOW (ref 4.22–5.81)

## 2010-07-13 ENCOUNTER — Ambulatory Visit (HOSPITAL_COMMUNITY)
Admission: AD | Admit: 2010-07-13 | Discharge: 2010-07-13 | Disposition: A | Discharge status: Home / Self Care | Payer: Medicare Other | Source: Other Acute Inpatient Hospital | Attending: Cardiology | Admitting: Cardiology

## 2010-07-13 DIAGNOSIS — D5700 Hb-SS disease with crisis, unspecified: Secondary | ICD-10-CM | POA: Insufficient documentation

## 2010-07-13 DIAGNOSIS — R0602 Shortness of breath: Secondary | ICD-10-CM | POA: Insufficient documentation

## 2010-07-13 DIAGNOSIS — R6889 Other general symptoms and signs: Secondary | ICD-10-CM | POA: Insufficient documentation

## 2010-07-13 LAB — CBC
Hemoglobin: 10.3 g/dL — ABNORMAL LOW (ref 13.0–17.0)
Platelets: 248 10*3/uL (ref 150–400)
RBC: 3.47 MIL/uL — ABNORMAL LOW (ref 4.22–5.81)
WBC: 10.9 10*3/uL — ABNORMAL HIGH (ref 4.0–10.5)

## 2010-07-13 LAB — BASIC METABOLIC PANEL
CO2: 33 mEq/L — ABNORMAL HIGH (ref 19–32)
Chloride: 104 mEq/L (ref 96–112)
Glucose, Bld: 99 mg/dL (ref 70–99)
Potassium: 3.4 mEq/L — ABNORMAL LOW (ref 3.5–5.1)
Sodium: 143 mEq/L (ref 135–145)

## 2010-07-13 LAB — POCT I-STAT 3, VENOUS BLOOD GAS (G3P V)
Acid-Base Excess: 2 mmol/L (ref 0.0–2.0)
O2 Saturation: 63 %
TCO2: 32 mmol/L (ref 0–100)
pCO2, Ven: 61.8 mmHg — ABNORMAL HIGH (ref 45.0–50.0)

## 2010-07-13 LAB — HEPATIC FUNCTION PANEL
ALT: 23 U/L (ref 0–53)
AST: 29 U/L (ref 0–37)
Alkaline Phosphatase: 68 U/L (ref 39–117)
Bilirubin, Direct: 0.4 mg/dL — ABNORMAL HIGH (ref 0.0–0.3)
Total Bilirubin: 1.4 mg/dL — ABNORMAL HIGH (ref 0.3–1.2)

## 2010-07-13 LAB — RETICULOCYTES
RBC.: 3.47 MIL/uL — ABNORMAL LOW (ref 4.22–5.81)
Retic Count, Absolute: 454.6 10*3/uL — ABNORMAL HIGH (ref 19.0–186.0)
Retic Ct Pct: 13.1 % — ABNORMAL HIGH (ref 0.4–3.1)

## 2010-07-13 LAB — APTT: aPTT: 36 seconds (ref 24–37)

## 2010-07-13 LAB — PROTIME-INR
INR: 1.11 (ref 0.00–1.49)
Prothrombin Time: 14.5 seconds (ref 11.6–15.2)

## 2010-07-14 ENCOUNTER — Inpatient Hospital Stay
Admit: 2010-07-14 | Discharge: 2010-07-14 | Disposition: A | Discharge status: Home / Self Care | Payer: Medicare Other | Attending: Family Medicine | Admitting: Family Medicine

## 2010-07-14 DIAGNOSIS — D571 Sickle-cell disease without crisis: Secondary | ICD-10-CM

## 2010-07-14 LAB — CBC
MCH: 30.4 pg (ref 26.0–34.0)
MCV: 87 fL (ref 78.0–100.0)
Platelets: 211 10*3/uL (ref 150–400)
RBC: 3.62 MIL/uL — ABNORMAL LOW (ref 4.22–5.81)

## 2010-07-14 LAB — BASIC METABOLIC PANEL
CO2: 32 mEq/L (ref 19–32)
Chloride: 107 mEq/L (ref 96–112)
Creatinine, Ser: 0.75 mg/dL (ref 0.4–1.5)
GFR calc Af Amer: >60 mL/min (ref >60)
Sodium: 142 mEq/L (ref 135–145)

## 2010-07-14 LAB — CSF CULTURE W GRAM STAIN: Culture: NO GROWTH

## 2010-07-15 DIAGNOSIS — D649 Anemia, unspecified: Secondary | ICD-10-CM

## 2010-07-15 DIAGNOSIS — D571 Sickle-cell disease without crisis: Secondary | ICD-10-CM

## 2010-07-15 LAB — RETICULOCYTES
RBC.: 3.82 MIL/uL — ABNORMAL LOW (ref 4.22–5.81)
Retic Count, Absolute: 355.3 10*3/uL — ABNORMAL HIGH (ref 19.0–186.0)

## 2010-07-15 LAB — CROSSMATCH
ABO/RH(D): O POS
Antibody Screen: NEGATIVE
Unit division: 0
Unit division: 0

## 2010-07-15 LAB — CBC
HCT: 33.7 % — ABNORMAL LOW (ref 39.0–52.0)
Hemoglobin: 11.5 g/dL — ABNORMAL LOW (ref 13.0–17.0)
WBC: 7.6 10*3/uL (ref 4.0–10.5)

## 2010-07-15 LAB — CULTURE, BLOOD (ROUTINE X 2)
Culture  Setup Time: 201204012345
Culture  Setup Time: 201204012345
Culture: NO GROWTH

## 2010-07-15 LAB — LACTATE DEHYDROGENASE: LDH: 328 U/L — ABNORMAL HIGH (ref 94–250)

## 2010-07-16 DIAGNOSIS — R52 Pain, unspecified: Secondary | ICD-10-CM

## 2010-07-16 DIAGNOSIS — D649 Anemia, unspecified: Secondary | ICD-10-CM

## 2010-07-16 LAB — CBC
HCT: 36.7 % — ABNORMAL LOW (ref 39.0–52.0)
MCV: 89.1 fL (ref 78.0–100.0)
RBC: 4.12 MIL/uL — ABNORMAL LOW (ref 4.22–5.81)
WBC: 7.1 10*3/uL (ref 4.0–10.5)

## 2010-07-16 LAB — CROSSMATCH
ABO/RH(D): O POS
Antibody Screen: NEGATIVE

## 2010-07-16 LAB — RETICULOCYTES: Retic Count, Absolute: 309 10*3/uL — ABNORMAL HIGH (ref 19.0–186.0)

## 2010-07-16 LAB — LACTATE DEHYDROGENASE: LDH: 355 U/L — ABNORMAL HIGH (ref 94–250)

## 2010-07-17 LAB — RETICULOCYTES
RBC.: 3.95 MIL/uL — ABNORMAL LOW (ref 4.22–5.81)
Retic Ct Pct: 7.4 % — ABNORMAL HIGH (ref 0.4–3.1)

## 2010-07-17 LAB — COMPREHENSIVE METABOLIC PANEL
ALT: 18 U/L (ref 0–53)
Calcium: 8.8 mg/dL (ref 8.4–10.5)
Creatinine, Ser: 0.89 mg/dL (ref 0.4–1.5)
Glucose, Bld: 122 mg/dL — ABNORMAL HIGH (ref 70–99)
Sodium: 143 mEq/L (ref 135–145)
Total Protein: 6.1 g/dL (ref 6.0–8.3)

## 2010-07-17 LAB — CBC
Hemoglobin: 12.3 g/dL — ABNORMAL LOW (ref 13.0–17.0)
MCHC: 34.5 g/dL (ref 30.0–36.0)
Platelets: 253 10*3/uL (ref 150–400)
RBC: 3.95 MIL/uL — ABNORMAL LOW (ref 4.22–5.81)

## 2010-07-18 LAB — RETICULOCYTES
RBC.: 3.83 MIL/uL — ABNORMAL LOW (ref 4.22–5.81)
Retic Count, Absolute: 321.7 10*3/uL — ABNORMAL HIGH (ref 19.0–186.0)

## 2010-07-18 LAB — CBC
HCT: 35.8 % — ABNORMAL LOW (ref 39.0–52.0)
Hemoglobin: 12.1 g/dL — ABNORMAL LOW (ref 13.0–17.0)
MCV: 93.5 fL (ref 78.0–100.0)
RBC: 3.83 MIL/uL — ABNORMAL LOW (ref 4.22–5.81)
RDW: 21.3 % — ABNORMAL HIGH (ref 11.5–15.5)
WBC: 7.4 10*3/uL (ref 4.0–10.5)

## 2010-07-19 LAB — RAPID URINE DRUG SCREEN, HOSP PERFORMED
Amphetamines: NOT DETECTED
Barbiturates: NOT DETECTED
Benzodiazepines: NOT DETECTED
Opiates: POSITIVE — AB
Tetrahydrocannabinol: NOT DETECTED

## 2010-07-19 LAB — CBC
HCT: 35.4 % — ABNORMAL LOW (ref 39.0–52.0)
Hemoglobin: 11.9 g/dL — ABNORMAL LOW (ref 13.0–17.0)
Hemoglobin: 10.4 g/dL — ABNORMAL LOW (ref 13.0–17.0)
RBC: 3.82 MIL/uL — ABNORMAL LOW (ref 4.22–5.81)
RBC: 3.7 MIL/uL — ABNORMAL LOW (ref 4.22–5.81)
WBC: 7.2 10*3/uL (ref 4.0–10.5)
WBC: 12 10*3/uL — ABNORMAL HIGH (ref 4.0–10.5)

## 2010-07-19 LAB — URINALYSIS, ROUTINE W REFLEX MICROSCOPIC
Bilirubin Urine: NEGATIVE
Ketones, ur: NEGATIVE mg/dL
Nitrite: NEGATIVE
Urobilinogen, UA: 1 mg/dL (ref 0.0–1.0)
pH: 6 (ref 5.0–8.0)

## 2010-07-19 LAB — DIFFERENTIAL
Eosinophils Relative: 2 % (ref 0–5)
Lymphocytes Relative: 24 % (ref 12–46)
Lymphs Abs: 2.9 10*3/uL (ref 0.7–4.0)
Monocytes Relative: 7 % (ref 3–12)
Neutro Abs: 8 10*3/uL — ABNORMAL HIGH (ref 1.7–7.7)

## 2010-07-19 LAB — CULTURE, BLOOD (ROUTINE X 2)
Culture: NO GROWTH
Culture: NO GROWTH

## 2010-07-19 LAB — RETICULOCYTES
RBC.: 3.82 MIL/uL — ABNORMAL LOW (ref 4.22–5.81)
Retic Count, Absolute: 298 10*3/uL — ABNORMAL HIGH (ref 19.0–186.0)
Retic Count, Absolute: 332.2 10*3/uL — ABNORMAL HIGH (ref 19.0–186.0)
Retic Ct Pct: 7.8 % — ABNORMAL HIGH (ref 0.4–3.1)

## 2010-07-19 LAB — COMPREHENSIVE METABOLIC PANEL
ALT: 31 U/L (ref 0–53)
AST: 50 U/L — ABNORMAL HIGH (ref 0–37)
Albumin: 4.2 g/dL (ref 3.5–5.2)
Calcium: 8.7 mg/dL (ref 8.4–10.5)
Creatinine, Ser: 1.03 mg/dL (ref 0.4–1.5)
GFR calc Af Amer: >60 mL/min (ref >60)
Sodium: 135 mEq/L (ref 135–145)
Total Protein: 7.6 g/dL (ref 6.0–8.3)

## 2010-07-19 LAB — URINE CULTURE: Culture: NO GROWTH

## 2010-07-19 LAB — CK TOTAL AND CKMB (NOT AT ARMC): Relative Index: 1.2 (ref 0.0–2.5)

## 2010-07-19 LAB — CARDIAC PANEL(CRET KIN+CKTOT+MB+TROPI): Total CK: 217 U/L (ref 7–232)

## 2010-07-24 ENCOUNTER — Emergency Department (HOSPITAL_COMMUNITY)
Admission: EM | Admit: 2010-07-24 | Discharge: 2010-07-24 | Discharge status: Against Medical Advice | Payer: Medicare Other | Attending: Family Medicine | Admitting: Family Medicine

## 2010-07-24 DIAGNOSIS — R52 Pain, unspecified: Secondary | ICD-10-CM | POA: Insufficient documentation

## 2010-07-24 NOTE — Consult Note (Signed)
NAME:  [name], [name]                ACCOUNT NO.:  [id number]  MEDICAL RECORD NO.:  [id number]           PATIENT TYPE:  I  LOCATION:  1236                         FACILITY:  [facility]  PHYSICIAN:  Marca Ancona, MD      DATE OF BIRTH:  22-Oct-1980  DATE OF CONSULTATION:  07/10/2010 DATE OF DISCHARGE:                                CONSULTATION   PRIMARY CARE PHYSICIAN:  Dr. Ronne Binning.  PRIMARY CARDIOLOGIST:  None.  CHIEF COMPLAINT:  Elevated cardiac enzymes and elevated BNP  HISTORY OF PRESENT ILLNESS:  [name] is a 30 year old male with no previous history of cardiac issues.  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  He was admitted on July 08, 2010, with substernal chest pain, bilateral groin pain and bilateral leg pain especially in the sides.  He was felt to be having a sickle cell crisis.  Cardiac enzymes were cycled and had some elevation.  His BNP had some elevation as well and Cardiology was asked to evaluate him.  [name] has no history of exertional chest pain.  He states that he has a certain amount of dyspnea on exertion but can generally walk one and sometimes two flights of stairs without stopping.  He is aware that when his blood counts drop, he has more problems with fatigue and dyspnea.  He denies any previous history of exertional chest pain and also denies any orthopnea, PND or lower extremity edema.  He has not had palpitations.  His chest pain has been continuous since admission.  It was initially 8/10 and is now at 06/10.  He had some shortness of breath with this but denies nausea or vomiting.  He did have some diaphoresis. His chest pain has improved slightly with pain control medications.  He has also had problems with generalized pruritus and fevers.  PAST MEDICAL HISTORY: 1. History of sickle cell anemia, last hospitalized in November and     early December of 2011. 2. 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 3.  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 Encephalopathy during a previous admission felt secondary to     narcotics, resolved. 5. 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 6. 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 7. History of positive PPD, status post INH therapy. 8. Remote history of malaria while he was in [place] 9. Sickle osteopathy.  PAST SURGICAL HISTORY:  He is status post laparoscopic cholecystectomy.  ALLERGIES:  He gets hives with MORPHINE.  CURRENT MEDICATIONS: 1. Aspirin 81 mg a day. 2. Coreg 3.125 mg b.i.d. 3. Chlorhexidine daily. 4. Colace 100 mg b.i.d. 5. DVT Lovenox. 6. Folic acid 1 mg a day. 7. Lasix 40 mg p.o. x1. 8. Dilaudid p.r.n. 9. Hydroxyurea 1000 mg b.i.d. 10.Bactroban nasally b.i.d. 11.Protonix 40 mg a day. 12.Zosyn 3.375 g IV q.8 h. 13.Senokot 2 tablets b.i.d. 14.Normal saline at 100 mL an hour. 15.Vancomycin 1 g IV q.12 h. 16.Vitamins A and D cream topically.  SOCIAL HISTORY:  He lives in [place].  He was born in [place] and his father is from [place].  He lives with a girlfriend.  He has attempted to go to school but hospitalizations have limited this and he is currently disabled.  He denies alcohol, tobacco or drug abuse.  FAMILY HISTORY:  Both of his parents are alive in their 28s.  They are carriers of sickle cell trait.  He has siblings but none of them have cardiac issues and none of them have sickle cell.  REVIEW OF SYSTEMS:  He has had fevers.  His appetite is poor.  He had generalized pruritus PTA.  He wears glasses.  The chest pain is described above.  He has some anxiety secondary to the situation.  He has arthralgias and ongoing joint and leg pain as well as bilateral groin pain.  Full 14-point review of systems is otherwise negative except as stated in the HPI.  PHYSICAL EXAMINATION:  VITAL SIGNS:  Temperature is 98.5, blood  pressure 122/63, heart rate 87, respiratory rate 14, O2 saturation 100% on 5 L. GENERAL:  He is a slender African male, in no acute distress. HEENT:  Normal. NECK:  There is no lymphadenopathy, thyromegaly or bruits noted and he has got JVP at 8 to 9 cm. CVA:  His heart is regular in rate and rhythm with an S1 and S2 and no significant murmur, rub, or gallop is noted.  Distal pulses are intact in all 4 extremities. LUNGS:  He has a few rales but they generally appear clear. SKIN:  No rashes or lesions are noted. ABDOMEN:  Soft and nontender with active bowel sounds. EXTREMITIES:  There is no cyanosis, clubbing or edema noted. MUSCULOSKELETAL:  There is no joint deformity or effusions and no spine or CVA tenderness. NEURO:  He is alert and oriented with cranial nerves II through XII grossly intact.  CT angiogram of the chest shows no PE centrally by CTA.  There was no acute intrathoracic process seen.  He has chronic changes of sickle cell disease.  His spleen was small and calcified, consistent with infarction.  He had hyperventilatory changes in the lower lobes with atelectasis.  There were diffuse osseous changes of chronic sickle cell disease seen.  EKG is sinus tach, rate 105 with no acute changes and minimally changed from an EKG dated November 2011.  LABORATORY VALUES:  Hemoglobin 9.3, hematocrit 26.1, WBC 15.9, platelets 226.  Reticulocyte percentage 18.5.  Sodium 144, potassium 4.04, 112, CO2 of 24, BUN 11, creatinine 1.06, glucose 97, total bilirubin 3.5, SGOT 78, LDH 437, CK-MB #1 2001/3.7 with an index of 0.2, then 1557/7.4 with an index of 0.5.  Troponin-I of 0.24, then 0.07.  BNP 358, then 447.  TSH 0.591.  MRSA by PCR positive.  Total cholesterol 114, triglycerides 83, HDL 39 LDL 58.  IMPRESSION:  [name] was seen today by Dr. Shirlee Latch, the patient evaluated and data reviewed.  His echo had been completed but not read. Dr. Shirlee Latch reviewed the echo at bedside.   His ejection fraction was 55% with no regional wall motion abnormalities noted and normal right ventricular size and function.  There was no TR Doppler jet, so the PASP was unable to be accurately estimated.  [name] was admitted with sickle cell pain crisis and fever as well as chest pain.  He had a mild elevation in his troponin and a moderate increase in his BNP. 1. Elevated troponin I:  We suspect that this is from demand  ischemia     from CHF and anemia rather than acute coronary syndrome.  His chest     pain is likely due to sickle cell crisis. 2. CHF:  The patient is mildly volume overloaded on exam.  His     echocardiogram shows normal systolic and diastolic function of the     left ventricle.  We will be concerned that sickle cell related     pulmonary hypertension could be present.  There is no TR Doppler     jet on echo, so it is hard to estimate his pulmonary pressures by     echo.  We would do a right heart cath when he is clinically stable     and afebrile.  Further intravenous fluid should be avoided but we     will hold off on diuresis as he is in an active pain crisis.  We     will continue to follow him closely with you and plan on scheduling     the right heart cath for Wednesday or Thursday of this week if he     is medically stable.     Theodore Demark, PA-C   ______________________________ Marca Ancona, MD    RB/MEDQ  D:  07/10/2010  T:  07/10/2010  Job:  [id number]  Electronically Signed by Theodore Demark PA-C on 07/12/2010 01:49:33 PM Electronically Signed by Marca Ancona MD on 07/24/2010 09:11:50 AM

## 2010-07-24 NOTE — Procedures (Signed)
  NAME:  [name], [name]                ACCOUNT NO.:  [id number]  MEDICAL RECORD NO.:  [id number]           PATIENT TYPE:  O  LOCATION:  CATH                         FACILITY:  [facility]  PHYSICIAN:  Marca Ancona, MD      DATE OF BIRTH:  1980/11/07  DATE OF PROCEDURE:  07/13/2010 DATE OF DISCHARGE:  07/13/2010                           CARDIAC CATHETERIZATION   PROCEDURE:  Right heart catheterization.  INDICATIONS:  This is a 30 year old who was admitted with sickle cell crisis.  He was found to have an elevated BNP level and mildly elevated troponin level.  Echo showed normal LV systolic function.  We are unable to assess pulmonary artery pressures from the echo.  He has significant shortness of breath.  Right heart catheterization was carried out to assess for pulmonary hypertension due to sickle cell disease.  PROCEDURE NOTE:  After informed consent was obtained, the right groin was sterilely prepped and draped.  Lidocaine 1% was used to locally anesthetize the right groin area.  The right common femoral vein was entered using modified Seldinger technique and a 7-French venous sheath was placed.  The right heart catheterization was carried out using balloon-tip Swan-Ganz catheter.  Samples were removed for oxygen saturation from the pulmonary artery.  FINDINGS:  Mean right atrial pressure 10 mmHg, RV 37/12, PA 32/17 with mean PA pressure of 25 mmHg.  Mean pulmonary capillary wedge pressure 17 mmHg.  Aortic saturation 97%.  PA saturation 63%.  Cardiac output 4.75. Cardiac index 2.64.  IMPRESSION:  There is no evidence for pulmonary arterial hypertension. I suspect that the patient has mild degree of left ventricular diastolic dysfunction with a mildly elevated wedge pressure.  This is likely due to sickle cell disease.  His borderline mean pulmonary artery pressure elevation is likely due to mildly elevated left atrial pressures.     Marca Ancona, MD     DM/MEDQ  D:   07/13/2010  T:  07/14/2010  Job:  [id number]  Electronically Signed by Marca Ancona MD on 07/24/2010 09:11:48 AM

## 2010-07-27 ENCOUNTER — Other Ambulatory Visit: Payer: Self-pay | Admitting: Hematology & Oncology

## 2010-07-27 ENCOUNTER — Encounter (HOSPITAL_BASED_OUTPATIENT_CLINIC_OR_DEPARTMENT_OTHER): Payer: Medicare Other | Admitting: Hematology & Oncology

## 2010-07-27 ENCOUNTER — Encounter (HOSPITAL_COMMUNITY)
Admission: RE | Admit: 2010-07-27 | Discharge: 2010-07-27 | Disposition: A | Discharge status: Home / Self Care | Payer: Medicare Other | Source: Ambulatory Visit | Attending: Hematology & Oncology | Admitting: Hematology & Oncology

## 2010-07-27 DIAGNOSIS — D649 Anemia, unspecified: Secondary | ICD-10-CM | POA: Insufficient documentation

## 2010-07-27 DIAGNOSIS — D571 Sickle-cell disease without crisis: Secondary | ICD-10-CM

## 2010-07-27 DIAGNOSIS — M8708 Idiopathic aseptic necrosis of bone, other site: Secondary | ICD-10-CM

## 2010-07-27 DIAGNOSIS — Z23 Encounter for immunization: Secondary | ICD-10-CM

## 2010-07-27 DIAGNOSIS — E559 Vitamin D deficiency, unspecified: Secondary | ICD-10-CM

## 2010-07-27 LAB — CBC WITH DIFFERENTIAL (CANCER CENTER ONLY)
BASO#: 0.2 10*3/uL (ref 0.0–0.2)
Eosinophils Absolute: 0.2 10*3/uL (ref 0.0–0.5)
HCT: 38.6 % — ABNORMAL LOW (ref 38.7–49.9)
HGB: 13.3 g/dL (ref 13.0–17.1)
LYMPH%: 27.6 % (ref 14.0–48.0)
MCH: 31.7 pg (ref 28.0–33.4)
MCV: 92 fL (ref 82–98)
MONO#: 0.8 10*3/uL (ref 0.1–0.9)
MONO%: 10.4 % (ref 0.0–13.0)
NEUT%: 57.1 % (ref 40.0–80.0)
Platelets: 459 10*3/uL — ABNORMAL HIGH (ref 145–400)
RBC: 4.2 10*6/uL (ref 4.20–5.70)
WBC: 7.4 10*3/uL (ref 4.0–10.0)

## 2010-07-27 NOTE — Discharge Summary (Signed)
[name], [name]                ACCOUNT NO.:  [id number]  MEDICAL RECORD NO.:  [id number]           PATIENT TYPE:  O  LOCATION:  CATH                         FACILITY:  [facility]  PHYSICIAN:  Erick Blinks, MD     DATE OF BIRTH:  [birth year]  DATE OF ADMISSION:  07/13/2010 DATE OF DISCHARGE:  07/13/2010                              DISCHARGE SUMMARY   PRIMARY CARE PHYSICIAN:  Lorelle Formosa, M.D.  ONCOLOGIST:  Rose Phi. Myna Hidalgo, M.D.  DISCHARGE DIAGNOSES: 1. Sickle cell crisis, resolved. 2. Positional headache secondary to lumbar puncture status post blood     patch, improved. 3. Hypoxia, resolved. 4. Slight elevation of troponin secondary to demand ischemia. 5. Diastolic dysfunction.  DISCHARGE MEDICATIONS: 1. Aspirin 81 mg p.o. daily. 2. OxyContin 80 mg p.o. b.i.d. 3. Hydroxyurea 1000 mg p.o. b.i.d. 4. Roxicodone 30 mg p.o. q.6 hours p.r.n. 5. Lactulose 30 mL p.o. q.6 hours p.r.n. 6. Celebrex 100 mg p.o. b.i.d. p.r.n.7. Folic acid 1 mg p.o. daily.  ADMISSION HISTORY:  This is a 30 year old African-American male with history of sickle cell disease who presents to the emergency room with acute onset of pain in both legs, his chest as well as left shoulder, which started at 10:00 p.m. the night prior to admission.  The patient also had associated shortness of breath, but denied any other complaints.  He was subsequently admitted for a sickle cell crisis.  For further details please refer to the history and physical dictated by Dr. Mechele Claude on March 31.  HOSPITAL COURSE: 1. Sickle cell crisis.  The patient was seen in consultation by Dr.     Myna Hidalgo.  He underwent exchange transfusion and his medications     were adjusted.  His crisis has since improved.  His hemoglobin is     stable.  He no longer has pain and is requesting to be discharged     home.  He will follow up with Dr. Myna Hidalgo as scheduled and can     follow up with his primary care physician for further  management. 2. Elevation of cardiac enzymes.  The patient had an increased     troponin of 0.07.  He was seen in consultation by [facility]     Cardiology.  Although it was felt that this was not an acute     coronary syndrome, there was concern for pulmonary hypertension.     He subsequently underwent a right heart catheterization on April 5,     which showed no evidence of pulmonary arterial hypertension, but it     did show some diastolic dysfunction. 3. Fever/leukocytosis.  The patient developed transient fever and     leukocytosis.  He was covered empirically with vancomycin and     Zosyn.  Urine culture was negative.  Chest x-ray was clear.  He     underwent lumbar puncture, which was also negative for any signs of     infection and the antibiotics were subsequently discontinued.  His     fever and leukocytosis may have been secondary to his underlying     sickle cell crisis, both  of which have resolved. 4. Headache.  The patient developed headache post lumbar puncture.  He     underwent a blood patch at [facility] on April 6.  He     reports improvement of his headache since then.  CONSULTATIONS: 1. [facility]. 2. Arlan Organ, M.D. from Hematology.  PROCEDURES: 1. Exchange transfusion. 2. Lumbar puncture 3. Blood patch done at [facility] on April 6.  DIAGNOSTIC IMAGING: 1. Cardiac catheterization on April 5 shows no evidence of pulmonary     arterial hypertension.  Suspect a mild degree of left ventricular     diastolic dysfunction with a mildly elevated wedge pressure, likely     secondary to sickle cell disease. 2. CT angiogram of the chest done on March 31 shows negative for     significant acute pulmonary embolus.  No acute intrathoracic     process.  Chronic changes of sickle cell disease. 3. CT head on April 2 shows normal examination. 4. Chest x-ray from April 3 shows left PICC line in good position.     Interval increase in central venous  congestion/pulmonary edema.  DISCHARGE INSTRUCTIONS:  The patient should continue on a regular diet and conduct his activity as tolerated.  He will follow up with Dr. Myna Hidalgo as previously scheduled and should follow up with Dr. Ronne Binning in the next week.  The plan was discussed with the patient as well as his family who are in agreement.  CONDITION ON DISCHARGE:  Condition at the time of discharge is improved.     Erick Blinks, MD     JM/MEDQ  D:  07/19/2010  T:  07/19/2010  Job:  [id number]  cc:   Lorelle Formosa, M.D. Fax: [phone]  Rose Phi. Myna Hidalgo, M.D. Fax: [phone]  Electronically Signed by Durward Mallard Kanav Kazmierczak  on 07/27/2010 12:28:32 PM

## 2010-07-28 ENCOUNTER — Encounter (HOSPITAL_BASED_OUTPATIENT_CLINIC_OR_DEPARTMENT_OTHER): Payer: Medicare Other | Admitting: Hematology & Oncology

## 2010-07-28 DIAGNOSIS — D571 Sickle-cell disease without crisis: Secondary | ICD-10-CM

## 2010-07-28 LAB — TYPE & CROSSMATCH - CHCC SATELLITE

## 2010-07-31 ENCOUNTER — Encounter (HOSPITAL_COMMUNITY): Payer: Medicare Other

## 2010-07-31 LAB — CROSSMATCH
ABO/RH(D): O POS
Unit division: 0
Unit division: 0

## 2010-08-01 LAB — VITAMIN D 25 HYDROXY (VIT D DEFICIENCY, FRACTURES): Vit D, 25-Hydroxy: 11 ng/mL — ABNORMAL LOW (ref 30–89)

## 2010-08-01 LAB — LACTATE DEHYDROGENASE: LDH: 232 U/L (ref 94–250)

## 2010-08-01 LAB — HEMOGLOBINOPATHY EVALUATION: Hgb A2 Quant: 3.1 % (ref 2.2–3.2)

## 2010-08-01 LAB — FERRITIN: Ferritin: 902 ng/mL — ABNORMAL HIGH (ref 22–322)

## 2010-08-01 NOTE — Consult Note (Signed)
NAME:  [name], [name]                ACCOUNT NO.:  [id number]  MEDICAL RECORD NO.:  [id number]           PATIENT TYPE:  I  LOCATION:  1240                         FACILITY:  [facility]  PHYSICIAN:  [name], M.D. DATE OF BIRTH:  1980/04/29  DATE OF CONSULTATION: DATE OF DISCHARGE:                                CONSULTATION   REASON FOR CONSULTATION:  Sickle cell crisis.  HISTORY OF PRESENT ILLNESS:  [name] is a very nice 30 year old African-American gentleman.  He is well-known to me.  He has hemoglobin SS disease.  He has actually been doing pretty well.  He really does not require exchange transfusions except when he has episodes of crisis. The last crisis was back in late November.  He has been active.  He has had no fevers, sweats or chills.  Apparently he woke up, I think on Saturday, with pain in the hips and legs.  This was typical for his crises.  I think this progressed for him.  He ultimately was admitted.  There was some element of delirium.  He did undergo a lumbar puncture yesterday.  His cell count was zero white cells.  He had zero red cells.  Total protein was 49 and total glucose was 70.  His TSH was 0.591.  He did have an elevated beta natriuretic peptide of 447.  A urine culture was done, which was negative.  He was clearly hemolyzing as his reticulocyte count was 18%.  On admission, his hemoglobin was 12.1, hematocrit was 33.9.  Platelet count was 250.  His metabolic panel showed sodium 138, potassium 4.1, BUN 8, creatinine 1.0.  He was admitted to the ICU.  He did have some elevated cardiac enzymes. His troponin was 0.07.  His creatine kinase was 1557.  His CK-MB was 7.4.  He was seen by Cardiology.  Dr. Shirlee Latch saw him.  Dr. Shirlee Latch did not feel that the elevated enzymes were from an acute coronary syndrome. He felt that this may have been from "demand ischemia".  He did have an echocardiogram done.  He had normal systolic and diastolic  function. His ejection fraction was 55% with no regional wall motion abnormalities.  He had normal valvular function.  He did not see any obvious pulmonary hypertension, but felt that a right heart catheterization would be instructive once he was more clinically stable.  He did have a CT angiogram of the chest when he came in.  This was negative for pulmonary embolism.  He had chronic changes of sickle cell disease.  There was no obvious intrathoracic process.  He did have a CT of the brain.  This again was normal.  When I saw him today, he seemed to be almost baseline mental status.  He was still having pain.  He is getting Dilaudid IV for pain.  He is also on oxycodone 30 mg every 4 hours for pain control to take by mouth.  He denied any nausea or vomiting.  He said that he did have one episode when he was in the hospital of vomiting.  PAST MEDICAL HISTORY:  His past medical history is  pretty much unremarkable.  There is complications of sickle cell with respect to avascular necrosis.  ALLERGIES:  MORPHINE.  MEDICATIONS ON ADMISSION: 1. Folic acid 1 mg daily. 2. Hydrea 1000 mg daily. 3. OxyContin 80 mg q.12 hours. 4. Oxycodone 30 mg q.6 hours p.r.n.  SOCIAL HISTORY:  Negative for tobacco or alcohol use.  He has no recreational drug use.  FAMILY HISTORY:  Family history is fairly noncontributory.  PHYSICAL EXAMINATION:  GENERAL:  This is a well-developed, well- nourished, black gentleman in no obvious distress. VITAL SIGNS:  Show a temperature of 98.3, pulse 85, respiratory rate 16, blood pressure 115/47. HEENT:  His head examination shows normocephalic, atraumatic skull. There is no scleral icterus.  He has no oral lesions. NECK:  There is no adenopathy. LUNGS:  Clear bilaterally. CARDIAC EXAMINATION:  Regular rate and rhythm with normal S1-S2.  He may have a 1/6 systolic ejection murmur. ABDOMINAL EXAMINATION:  Soft with good bowel sounds.  There is no palpable  abdominal mass.  There is no fluid wave.  There is no palpable hepatosplenomegaly. BACK EXAMINATION:  No tenderness over the spine.  There is some tenderness to palpation over his hips. EXTREMITIES:  Shows some tenderness over the long bones in his legs.  No swelling is noted in his lower legs.  He has good range of motion of his joints. SKIN EXAMINATION:  No rashes. NEUROLOGICAL EXAMINATION:  No focal neurological deficits.  LABORATORY DATA:  His laboratory studies today show a white cell count of 11.2, hemoglobin 10.1, hematocrit 27.7, platelet count 250.  Reticulocyte count is 18%.  Sodium 140, potassium 3.7, BUN 7, creatinine 0.9.  His total bilirubin is 2.7.  LDH is 387.  Alkaline phosphatase is 94.  His beta natriuretic peptide is 112.  IMPRESSION:  [name] is a 30 year old African-American gentleman with hemoglobin sickle cell disease.  He is having another crisis.  PLAN:  I think that the best way to try to help him is to exchange transfuse him.  Iron overload has never been a problem for him.  As such, we can be aggressive with exchange transfusing him.  He is a little bit anemic, but not to the point that we need to compromise our exchange transfusion program.  I would probably take out one and a half to two units of blood.  I would then transfuse that two units of blood back into him.  I probably would do this for the next two or three days.  This would dilute out his sickle cell and I think would help get his crisis under better control.  I do not see any problems with him on antibiotics right now.  I would wait for all of the cultures to come back.  I do not think that meningitis is an issue for him.  He is really at his normal mental status baseline from my point of view.  We will be aggressive with his pain control.  At some point we will probably get him back on his OxyContin.  He does need to have a PICC line placed for better IV access and so that the  exchange transfusion can be done less laboriously.  We will certainly follow along.  He does need incentive spirometer.  There have been some studies that may show that low-molecular weight heparin might be helpful with respect to crises.  He is on Lovenox at a prophylactic dose right now.     [name], M.D.  PRE/MEDQ  D:  07/11/2010  T:  07/11/2010  Job:  [id number]  Electronically Signed by Arlan Organ  on 08/01/2010 08:31:27 AM

## 2010-08-04 ENCOUNTER — Encounter (HOSPITAL_COMMUNITY): Payer: Medicare Other

## 2010-08-08 ENCOUNTER — Encounter (HOSPITAL_COMMUNITY): Payer: Medicare Other | Attending: Hematology & Oncology

## 2010-08-08 DIAGNOSIS — D649 Anemia, unspecified: Secondary | ICD-10-CM | POA: Insufficient documentation

## 2010-08-09 NOTE — Group Therapy Note (Signed)
NAMEKESLEY, [name]                ACCOUNT NO.:  [id number]  MEDICAL RECORD NO.:  [id number]           PATIENT TYPE:  I  LOCATION:  1426                         FACILITY:  [facility]  PHYSICIAN:  Brendia Sacks, MD    DATE OF BIRTH:  November 30, 1980                                PROGRESS NOTE   PRIMARY CARE PHYSICIAN: Lorelle Formosa, M.D.  PRIMARY HEMATOLOGIST: Rose Phi. Myna Hidalgo, M.D.  INTERIM DIAGNOSES: 1. Sickle cell crisis. 2. Positional headache secondary to lumbar puncture.  Status post     blood patch, July 14, 2010. 3. Hypoxia, resolved. 4. Status post demand ischemia. 5. Perhaps mild asymptomatic diastolic dysfunction.  BRIEF NARRATIVE: This is a 30 year old male who presented to the Emergency Room with sickle cell crisis.  He is noted to be hypoxic at that time, was started on antibiotics.  He was continued on IV pain medications and seen by Dr. Myna Hidalgo.  He did undergo exchange transfusions for his sickle cell pain crisis which has slowly improved.  His back pain is improving at this time and continues on his chronic narcotics as per Dr. Myna Hidalgo.  The patient developed a headache and the lumbar puncture was performed. Cultures were negative and antibiotics were stopped.  However, the patient's headache seems worse and become positional after the lumbar puncture and a blood patch was performed on July 14, 2010.  The patient's headache is slowly improving daily and today he is able to set up for a prolonged period of time.  He had an element of demand ischemia, seen in consultation with cardiology.  This patient was noted to have an elevated BNP and mildly elevated troponin, it was recommended that he had a right heart catheterization to assess for pulmonary hypertension secondary to his sickle cell disease.  This was conducted and there was no evidence of pulmonary hypertension.  It was thought that he had a mild degree of left ventricular diastolic dysfunction.   As the patient had bradycardia, cannot tolerate Coreg.  At this point, patient seems be slowly improving, he will continue on his narcotics, and plan to discharge home when his condition is stable.  ACTIVE CONSULTANT: 1. Hematology, Rose Phi. Myna Hidalgo, M.D. 2. Note that cardiology was following the patient but has signed off.  SUBJECTIVE: The patient is feeling better today, sickle pain and his back is improving.  His positional headache is still present but he is able to sit up for a longer period of time.  OBJECTIVE: VITAL SIGNS:  Temperature is 97.6, pulse 76, respirations 18, blood pressure 149/88, sat of 100% on 2 L. GENERAL:  Well-developed, well-nourished, no acute distress. CARDIOVASCULAR:  Regular rate and rhythm.  No murmur, rub, gallop.  No lower extremity edema.  LABORATORY DATA: CBC noted that with a hemoglobin of 12.1 which appears to be stable.  ASSESSMENT/PLAN: 1. Sickle cell crisis.  Slowly improving.  Continue oxycodone and     OxyContin as well for pain.  No more exchange transfusions are     anticipated at this point. 2. Positional headache.  Slowly improving after blood patch on April ,  2012. 3. Status post demand ischemia.  This condition has resolved.  PROCEDURES: 1. Exchange transfusion. 2. Right heart catheterization. 3. Blood patch performed at [facility], July 14, 2010.     Brendia Sacks, MD     DG/MEDQ  D:  07/18/2010  T:  07/18/2010  Job:  [id number]  cc:   Lorelle Formosa, M.D. Fax: [phone]  Electronically Signed by Brendia Sacks  on 08/09/2010 10:02:53 PM

## 2010-08-22 NOTE — H&P (Signed)
NAME:  [name], [name]                ACCOUNT NO.:  [id number]   MEDICAL RECORD NO.:  [id number]          PATIENT TYPE:  INP   LOCATION:  0103                         FACILITY:  [facility]   PHYSICIAN:  Joylene John, MD       DATE OF BIRTH:  October 29, 1980   DATE OF ADMISSION:  07/31/2008  DATE OF DISCHARGE:                              HISTORY & PHYSICAL   REASON FOR ADMISSION:  Upper and lower extremity pain which started this  morning.   HISTORY OF PRESENT ILLNESS:  This is a 30 year old African American male  who has a history of sickle cell disease.  His last admission for pain  crisis was in 2007.  The patient is coming in with upper and lower  extremity pain, which started earlier this morning around 6 a.m., and  has progressively gotten worse, which caused him to come to the  emergency room to seek further care.  The patient denies any fevers,  chills, dysuria or sick contacts.  He does tell me that he has some  chest pain now.  The patient's usual pain crisis is pain in the back and  the chest area; however, he has had episodes where he has had upper and  lower extremity pain in the past.  The patient denies any unusual  activity or exertion; however, does admit to drinking about three to  four beers last night, since he is celebrating his brother's marriage.  The patient decided to come in after his pain did not resolve following  his pain medication, after he that he took his pain medication earlier  today.   PAST MEDICAL HISTORY:  1. Sickle cell disease, currently on hydroxyurea.  2. Status post cholecystectomy.   SOCIAL HISTORY:  He is on disability.  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  Social drinker of alcohol per the patient.  No drugs or  smoking.   FAMILY HISTORY:  No family history of sickle cell disease.   ALLERGIES:  MORPHINE.  He breaks out in hives.   REVIEW OF SYSTEMS:  Please refer to the HPI, otherwise a 14-point review  of systems is negative.   IMAGING:  There was no imaging that was done in the ER.   LABORATORY DATA:  Pertinent labs show a white count of 12, hemoglobin  10.4, hematocrit 31.1, platelets 339.  Sodium 135, potassium 3.7,  chloride 109, bicarb 18, BUN 9, creatinine 1.03, glucose 107.  T-bili  2.6, alk phos 1002, anion gap of 8, albumin of 4.2, AST elevated at 50,  ALT 31, calcium 8.7.   PHYSICAL EXAMINATION:  VITAL SIGNS:  Show blood pressure of 146/73,  temperature 97.8, pulse 96, respirations 20.  The patient was originally  94% on room air, currently 100% on nasal cannula oxygen.  GENERAL:  The patient is sedated; however, he is easily arousable,  answering questions appropriately.  HEENT:  Reveals mild icterus and pallor.  NECK:  No JVD appreciated.  CARDIOVASCULAR:  Regular rate and rhythm.  No murmurs heard.  The  patient is tachycardiac.  LUNGS:  Clear to  auscultation.  EXTREMITIES:  Lower Extremities:  No edema appreciated.  Pain is  elicited on palpation of his proximal portion of the thighs and  bilateral shoulders.  The range of motion is limited secondary to pain.   ASSESSMENT:  A 30 year old [place] American male, coming in with sickle  cell crisis and pain.   PLAN:  Is to admit the patient to [facility].  IV fluids for  the next 24 hours and then to reassess and see if they need to be  continued.  For pain control, we will continue his long-acting  OxyContin.  Will also give him oxycodone for his p.r.n. pain and  Dilaudid for severe pain.  The patient will be discharged, once the pain  is under control.  The plan is to get a chest x-ray and also do blood  cultures and urine cultures, to rule out infection,  since the patient  does have a mildly elevated white count; however, this could be also  secondary to nucleated RBCs, but in the past his white count has been  normal.  The plan is to repeat his lab work in the morning, since his  creatinine is a little bit elevated when compared to  the past.  This  could be secondary to him being volume depleted.  The AST is twice the  level of his ALT, which could be seen, as the patient is a regular  alcohol drinker; however, he denies this.  The patient is status post  cholecystectomy with normal alk phos. His T-bili is elevated.  This  could be secondary to hemolysis.  This needs to be repeated in the  morning as well.  The plan is to also get a urine tox, to rule out any  drug use.  The chest x-ray is to rule out any pneumonia.  The patient  may need further imaging and workup, based on lab work and results that  show up from today's workup, and also if there is no relief of his pain.      Joylene John, MD  Electronically Signed     RP/MEDQ  D:  07/31/2008  T:  07/31/2008  Job:  [id number]

## 2010-08-22 NOTE — Discharge Summary (Signed)
NAME:  [name], [name]                ACCOUNT NO.:  [id number]   MEDICAL RECORD NO.:  [id number]          PATIENT TYPE:  INP   LOCATION:  1442                         FACILITY:  [facility]   PHYSICIAN:  Joylene John, MD       DATE OF BIRTH:  [birth year]   DATE OF ADMISSION:  07/31/2008  DATE OF DISCHARGE:  08/01/2008                               DISCHARGE SUMMARY   DISCHARGE DIAGNOSIS:  Sickle cell pain crisis.   HOSPITAL COURSE:  [name] is a 30 year old male with a history of  sickle cell disease.  He was admitted by Dr. Allena Katz on July 31, 2008,  for sickle cell pain crisis.  He was started on IV fluids and pain  medications were prescribed, however, he continued to have ongoing pain.  He continued to complain about not receiving adequate narcotic therapy  despite appearing quite sedated on exam.  Subsequently, he became  increasingly frustrated and then signed out against medical advice  saying I am going to [facility] since I cannot get any pain relief  here.  He then signed out AMA.  Date of discharge is August 01, 2008.   Discharge medications were unchanged from previous, these include:  OxyContin, oxycodone, and hydroxyurea as prescribed by his primary care  physician.      Bevelyn Buckles. Bensimhon, MD  Electronically Signed      Joylene John, MD  Electronically Signed    DRB/MEDQ  D:  11/10/2008  T:  11/11/2008  Job:  [id number]

## 2010-08-24 ENCOUNTER — Other Ambulatory Visit: Payer: Self-pay | Admitting: Hematology & Oncology

## 2010-08-24 ENCOUNTER — Encounter (HOSPITAL_BASED_OUTPATIENT_CLINIC_OR_DEPARTMENT_OTHER): Payer: Medicare Other | Admitting: Hematology & Oncology

## 2010-08-24 DIAGNOSIS — D571 Sickle-cell disease without crisis: Secondary | ICD-10-CM

## 2010-08-24 DIAGNOSIS — Z23 Encounter for immunization: Secondary | ICD-10-CM

## 2010-08-24 DIAGNOSIS — M8708 Idiopathic aseptic necrosis of bone, other site: Secondary | ICD-10-CM

## 2010-08-24 LAB — CBC WITH DIFFERENTIAL (CANCER CENTER ONLY)
BASO#: 0.1 10*3/uL (ref 0.0–0.2)
Eosinophils Absolute: 0.2 10*3/uL (ref 0.0–0.5)
HCT: 36.4 % — ABNORMAL LOW (ref 38.7–49.9)
HGB: 13.2 g/dL (ref 13.0–17.1)
LYMPH#: 2.6 10*3/uL (ref 0.9–3.3)
MCH: 31.7 pg (ref 28.0–33.4)
NEUT#: 4.9 10*3/uL (ref 1.5–6.5)
NEUT%: 57.3 % (ref 40.0–80.0)
RBC: 4.16 10*6/uL — ABNORMAL LOW (ref 4.20–5.70)

## 2010-08-25 NOTE — Op Note (Signed)
NAMEVONDELL, BABERS               ACCOUNT NO.:  [id number]   MEDICAL RECORD NO.:  [id number]          PATIENT TYPE:  INP   LOCATION:  0470                         FACILITY:  [facility]   PHYSICIAN:  Angelia Mould. Derrell Lolling, M.D.DATE OF BIRTH:  Apr 20, 1980   DATE OF PROCEDURE:  01/09/2004  DATE OF DISCHARGE:                                 OPERATIVE REPORT   PREOPERATIVE DIAGNOSIS:  Acute cholecystitis with cholelithiasis.   POSTOPERATIVE DIAGNOSIS:  Acute cholecystitis with cholelithiasis.   PROCEDURE:  Laparoscopic cholecystectomy with intraoperative cholangiogram.   SURGEON:  Angelia Mould. Derrell Lolling, M.D.   FIRST ASSISTANT:  Leonie Man, M.D.   ANESTHESIA:  General.   INDICATIONS FOR PROCEDURE:  This is a 30 year old black male with sickle  cell disease.  He has had some vaso-occlusive crises in the past from his  sickle cell disease.  He presented to the emergency room this morning with  severe right upper quadrant pain, radiating into his right chest.  He had  had a similar, previous episode about 5 days ago. Examination revealed right  upper quadrant tenderness. Gallbladder ultrasound showed multiple  gallstones.  Liver function tests showed bilirubin of 2.7 but his liver  enzymes were only minimally abnormal, suggesting that his bilirubin may be  due more to his sickle cell disease than to gallbladder disease.  Cholecystectomy was offered to the patient.  He requested that we go ahead  and do this today.  The patient was brought to the operating room  electively, following admission through the ER.   OPERATIVE FINDINGS:  The gallbladder was acutely inflamed. It was acutely  edematous and contained several gallstones.  The anatomy of the cystic duct,  cystic artery and common bile duct was conventional. The cholangiogram  showed normal biliary anatomy, prompt flow of contrast into the duodenum and  no filling defects.  The liver, stomach, duodenum, small intestine and large  intestine  and peritoneal surfaces otherwise looked normal.   DESCRIPTION OF PROCEDURE:  Following the induction of general endotracheal  anesthesia, the patient's abdomen was prepped and draped in a sterile  fashion.  Then 0.5% Marcaine with epinephrine was used as a local  infiltration anesthetic. A curved, transverse incision was made at the lower  rim of the umbilicus. The fascia was incised in the midline and the  abdominal cavity entered under direct vision.  A 10 mm Hasson trocar was  inserted and secured with a pursestring suture of 0-Vicryl.  Pneumoperitoneum was created.  Video camera was inserted with visualization  and findings as described above. A 10 mm trocar was placed in the upper  midline, two 5 mm trocars were placed in the right and mid-abdomen.   The gallbladder was identified.  The fundus was elevated. The infundibulum  was retracted laterally.  Adhesions were taken down. I dissected out the  cystic duct and the cystic artery very carefully.  The cystic artery was  secured with multiple metal clips and divided as it went onto the wall of  the gallbladder.  This left a nice, large window behind the cystic duct.  A  metal  clip was placed on the cystic duct close to the gallbladder.  A  cholangiogram catheter was inserted into the cystic duct.  A cholangiogram  was obtained using the C-arm.  This showed normal intrahepatic and extra-  hepatic biliary anatomy, excellent prograde flow of contrast into the  duodenum and no filling defects were seen.   The catheter was removed. The cystic duct was secured with multiple metal  clips and divided. The gallbladder was dissected from its bed with  electrocautery and removed through the umbilical port.  The operative field  was copiously irrigated.  Hemostasis was excellent and achieved with  electrocautery.  At the completion of the case, the irrigation fluid was  completely clear, and there was no bleeding or bile leak whatsoever.  The   trocars were removed under direct vision and there was no bleeding from the  trocar sites.  The pneumoperitoneum was released.   The fascia at the umbilicus was closed with 0-Vicryl suture.  Skin incisions  were closed with subcuticular sutures of 4-0 Vicryl and Steri-Strips.  Clean  bandages were placed. The patient was taken to the recovery room in stable  condition.  Estimated blood loss was about 10 cc.  No complications. Sponge,  instrument and needle counts were correct.      HMI/MEDQ  D:  01/09/2004  T:  01/09/2004  Job:  [id number]   cc:   Lorelle Formosa, M.D.  [phone] [address]. [address]  [place]  [place] [address]  Fax: [phone]

## 2010-08-28 LAB — COMPREHENSIVE METABOLIC PANEL
BUN: 12 mg/dL (ref 6–23)
CO2: 21 mEq/L (ref 19–32)
Creatinine, Ser: 0.81 mg/dL (ref 0.40–1.50)
Glucose, Bld: 91 mg/dL (ref 70–99)
Total Bilirubin: 2.7 mg/dL — ABNORMAL HIGH (ref 0.3–1.2)

## 2010-08-28 LAB — HEMOGLOBINOPATHY EVALUATION
Hgb A: 32.5 % — ABNORMAL LOW (ref 96.8–97.8)
Hgb F Quant: 8.5 % — ABNORMAL HIGH (ref 0.0–2.0)
Hgb S Quant: 56 % — ABNORMAL HIGH (ref 0.0–0.0)

## 2010-08-28 LAB — FERRITIN: Ferritin: 560 ng/mL — ABNORMAL HIGH (ref 22–322)

## 2010-08-28 LAB — LACTATE DEHYDROGENASE: LDH: 188 U/L (ref 94–250)

## 2010-09-06 ENCOUNTER — Ambulatory Visit
Admission: RE | Admit: 2010-09-06 | Discharge: 2010-09-06 | Disposition: A | Discharge status: Home / Self Care | Payer: Medicare Other | Source: Ambulatory Visit | Attending: General Practice | Admitting: General Practice

## 2010-09-06 ENCOUNTER — Other Ambulatory Visit: Payer: Self-pay | Admitting: General Practice

## 2010-09-06 DIAGNOSIS — M542 Cervicalgia: Secondary | ICD-10-CM

## 2010-09-06 DIAGNOSIS — M549 Dorsalgia, unspecified: Secondary | ICD-10-CM

## 2010-09-07 ENCOUNTER — Ambulatory Visit: Payer: Medicare Other | Attending: General Practice | Admitting: Physical Therapy

## 2010-09-07 DIAGNOSIS — M545 Low back pain, unspecified: Secondary | ICD-10-CM | POA: Insufficient documentation

## 2010-09-07 DIAGNOSIS — IMO0001 Reserved for inherently not codable concepts without codable children: Secondary | ICD-10-CM | POA: Insufficient documentation

## 2010-09-07 DIAGNOSIS — M542 Cervicalgia: Secondary | ICD-10-CM | POA: Insufficient documentation

## 2010-09-07 DIAGNOSIS — M25519 Pain in unspecified shoulder: Secondary | ICD-10-CM | POA: Insufficient documentation

## 2010-09-12 ENCOUNTER — Ambulatory Visit: Payer: Medicare Other | Attending: General Practice | Admitting: Rehabilitation

## 2010-09-12 DIAGNOSIS — M542 Cervicalgia: Secondary | ICD-10-CM | POA: Insufficient documentation

## 2010-09-12 DIAGNOSIS — M545 Low back pain, unspecified: Secondary | ICD-10-CM | POA: Insufficient documentation

## 2010-09-12 DIAGNOSIS — M25519 Pain in unspecified shoulder: Secondary | ICD-10-CM | POA: Insufficient documentation

## 2010-09-12 DIAGNOSIS — IMO0001 Reserved for inherently not codable concepts without codable children: Secondary | ICD-10-CM | POA: Insufficient documentation

## 2010-09-14 ENCOUNTER — Ambulatory Visit: Payer: Medicare Other | Admitting: Physical Therapy

## 2010-09-14 ENCOUNTER — Encounter: Payer: Medicare Other | Admitting: Physical Therapy

## 2010-09-19 ENCOUNTER — Ambulatory Visit: Payer: Medicare Other | Admitting: Rehabilitation

## 2010-09-20 ENCOUNTER — Ambulatory Visit: Payer: Medicare Other | Admitting: Rehabilitation

## 2010-09-21 NOTE — H&P (Signed)
NAME:  [name], [name]                ACCOUNT NO.:  [id number]  MEDICAL RECORD NO.:  [id number]           PATIENT TYPE:  I  LOCATION:  1317                         FACILITY:  [facility]  PHYSICIAN:  Clayborn Bigness, MD, MPH  DATE OF BIRTH:  1980-06-21  DATE OF ADMISSION:  07/08/2010 DATE OF DISCHARGE:                             HISTORY & PHYSICAL   CHIEF COMPLAINT:  Sickle-cell crisis.  HISTORY OF PRESENT ILLNESS:  This is a 30 year old male who presents with an acute onset of pain in both legs, his chest, left shoulder and back, which started about 10 p.m. last night.  There was associated shortness of breath, but no fevers, chills, dizziness, lower extremity swelling.  No precipitating events.  He describes this as typical for his severe sickle cell crisis.  His last severe pain crisis which required hospitalization was in November 2011.  PAST MEDICAL HISTORY:  Significant for sickle cell disease and avascular necrosis of both shoulders (per patient report).  FAMILY HISTORY:  Noncontributory.  He states that all of his family is healthy.  SOCIAL HISTORY:  Negative for tobacco, illicit drugs.  He drinks alcohol occasionally.  He is married.  ALLERGIES:  He is allergic to MORPHINE which causes itching.  HOME MEDICATIONS:  Include oxycodone 80 mg b.i.d., Roxicodone 30 mg q.4 h p.r.n., hydroxyurea 1000 mg daily, folate 1 mg daily.  REVIEW OF SYSTEMS:  His 10-point review of systems is otherwise unremarkable.  PHYSICAL EXAM:  TRIAGE VITAL SIGNS:  Included a temperature of 98.6, his pulse was 66, respiratory rate was 18, blood pressure was 152/92, O2 sat was 93% on room air.  GENERAL:  He was a healthy-appearing male, who was in mild distress secondary to pain.  HEENT:  Pupils were equally round and reactive to light and accommodation.  Extraocular movements were intact.  He had dry oral mucosa.  NECK:  Supple with no lymphadenopathy. CHEST:  Heart was regular rate and rhythm with no  murmurs, rubs or gallops.  No reproducible chest pain.  PULMONARY:  Lungs were clear to auscultation bilaterally.  No wheezing, rales or rhonchi.  ABDOMEN: Soft, nontender and no palpable masses.  Hypoactive bowel sounds. EXTREMITIES:  No lower extremity swelling.  SKIN:  Warm to touch, dry and intact.  NEUROLOGIC:  Cranial nerves II-XII were nonfocal.  LABORATORY DATA:  Significant for a negative urinalysis.  His chest x- ray was negative for acute infiltrates.  CBC showed a white count of 11, hemoglobin 12, hematocrit 33, platelet count 250.  BMP had a sodium of 140, potassium 4.1, chloride 108, bicarb 23, BUN 11, creatinine 0.8, glucose 92.  Calcium was 9.9.  His ABG was significant for a pH of 7.34, pCO2 of 44, pAO2 of 64, and a bicarb of 23.  ASSESSMENT AND PLAN: 1. This is a 30 year old male who is presenting with a sickle cell     pain crisis.  His most significant issue at this time is hypoxemia,     so the plan is to get a CT angiogram to rule out pulmonary emboli. 2. For his pain, we will treat him with Dilaudid  4 mg IV q.4 h as     needed and continue his home dose of Roxicodone.  I will hold his     Celebrex while he is in the hospital. 3. For his sickle cell disease, we will continue his hydroxyurea and     folate.  We will give him some IV fluids, as he appears to be     slightly volume depleted by exam. 4. He is Full Code.          ______________________________ Clayborn Bigness, MD, MPH     CC/MEDQ  D:  07/08/2010  T:  07/08/2010  Job:  [id number]  Electronically Signed by Clayborn Bigness MD on 09/21/2010 08:28:52 AM

## 2010-09-26 ENCOUNTER — Ambulatory Visit: Payer: Medicare Other | Admitting: Rehabilitation

## 2010-09-27 ENCOUNTER — Ambulatory Visit: Payer: Medicare Other | Admitting: Physical Therapy

## 2010-10-26 ENCOUNTER — Encounter (HOSPITAL_BASED_OUTPATIENT_CLINIC_OR_DEPARTMENT_OTHER): Payer: Medicare Other | Admitting: Hematology & Oncology

## 2010-10-26 ENCOUNTER — Other Ambulatory Visit: Payer: Self-pay | Admitting: Hematology & Oncology

## 2010-10-26 DIAGNOSIS — D571 Sickle-cell disease without crisis: Secondary | ICD-10-CM

## 2010-10-26 DIAGNOSIS — E559 Vitamin D deficiency, unspecified: Secondary | ICD-10-CM

## 2010-10-26 LAB — CBC WITH DIFFERENTIAL (CANCER CENTER ONLY)
BASO#: 0.1 10*3/uL (ref 0.0–0.2)
EOS%: 1.1 % (ref 0.0–7.0)
LYMPH%: 35.1 % (ref 14.0–48.0)
MCH: UNDETERMINED pg (ref 28.0–33.4)
MCHC: UNDETERMINED g/dL (ref 32.0–35.9)
MCV: 83 fL (ref 82–98)
MONO%: 12.9 % (ref 0.0–13.0)
NEUT#: 4.2 10*3/uL (ref 1.5–6.5)
Platelets: 327 10*3/uL (ref 145–400)

## 2010-10-26 LAB — TECHNOLOGIST REVIEW CHCC SATELLITE

## 2010-10-26 LAB — CHCC SATELLITE - SMEAR

## 2010-10-27 LAB — HAPTOGLOBIN: Haptoglobin: 7 mg/dL — ABNORMAL LOW (ref 30–200)

## 2010-10-27 LAB — RETICULOCYTES (CHCC)
ABS Retic: 633.4 10*3/uL — ABNORMAL HIGH (ref 19.0–186.0)
RBC.: 3.64 MIL/uL — ABNORMAL LOW (ref 4.22–5.81)
Retic Ct Pct: 17.4 % — ABNORMAL HIGH (ref 0.4–2.3)

## 2010-11-01 ENCOUNTER — Emergency Department (HOSPITAL_COMMUNITY): Payer: Medicare Other

## 2010-11-01 ENCOUNTER — Inpatient Hospital Stay (HOSPITAL_COMMUNITY)
Admission: EM | Admit: 2010-11-01 | Discharge: 2010-11-18 | DRG: 193 | Disposition: A | Discharge status: Home / Self Care | Payer: Medicare Other | Attending: Internal Medicine | Admitting: Internal Medicine

## 2010-11-01 DIAGNOSIS — T507X5A Adverse effect of analeptics and opioid receptor antagonists, initial encounter: Secondary | ICD-10-CM | POA: Diagnosis not present

## 2010-11-01 DIAGNOSIS — D5701 Hb-SS disease with acute chest syndrome: Secondary | ICD-10-CM | POA: Diagnosis present

## 2010-11-01 DIAGNOSIS — D5700 Hb-SS disease with crisis, unspecified: Secondary | ICD-10-CM | POA: Diagnosis present

## 2010-11-01 DIAGNOSIS — R0902 Hypoxemia: Secondary | ICD-10-CM | POA: Diagnosis not present

## 2010-11-01 DIAGNOSIS — R404 Transient alteration of awareness: Secondary | ICD-10-CM | POA: Diagnosis not present

## 2010-11-01 DIAGNOSIS — J189 Pneumonia, unspecified organism: Principal | ICD-10-CM | POA: Diagnosis present

## 2010-11-01 LAB — COMPREHENSIVE METABOLIC PANEL
ALT: 22 U/L (ref 0–53)
Albumin: 4.3 g/dL (ref 3.5–5.2)
Alkaline Phosphatase: 115 U/L (ref 39–117)
Potassium: 4.2 mEq/L (ref 3.5–5.1)
Sodium: 139 mEq/L (ref 135–145)
Total Protein: 7.7 g/dL (ref 6.0–8.3)

## 2010-11-01 LAB — RETICULOCYTES
RBC.: 3.59 MIL/uL — ABNORMAL LOW (ref 4.22–5.81)
Retic Ct Pct: 25.1 % — ABNORMAL HIGH (ref 0.4–3.1)

## 2010-11-01 LAB — DIFFERENTIAL
Basophils Relative: 1 % (ref 0–1)
Eosinophils Relative: 1 % (ref 0–5)
Monocytes Absolute: 1.5 10*3/uL — ABNORMAL HIGH (ref 0.1–1.0)
Neutro Abs: 6.7 10*3/uL (ref 1.7–7.7)
Neutrophils Relative %: 53 % (ref 43–77)

## 2010-11-01 LAB — URINALYSIS, ROUTINE W REFLEX MICROSCOPIC
Bilirubin Urine: NEGATIVE
Glucose, UA: NEGATIVE mg/dL
Hgb urine dipstick: NEGATIVE
Ketones, ur: NEGATIVE mg/dL
Nitrite: NEGATIVE
Specific Gravity, Urine: 1.013 (ref 1.005–1.030)
pH: 5.5 (ref 5.0–8.0)

## 2010-11-01 LAB — CBC
Hemoglobin: 10.8 g/dL — ABNORMAL LOW (ref 13.0–17.0)
MCH: 30.1 pg (ref 26.0–34.0)
RBC: 3.59 MIL/uL — ABNORMAL LOW (ref 4.22–5.81)

## 2010-11-02 LAB — COMPREHENSIVE METABOLIC PANEL
ALT: 39 U/L (ref 0–53)
AST: 79 U/L — ABNORMAL HIGH (ref 0–37)
Alkaline Phosphatase: 90 U/L (ref 39–117)
CO2: 25 mEq/L (ref 19–32)
Calcium: 8.9 mg/dL (ref 8.4–10.5)
GFR calc non Af Amer: >60 mL/min (ref >60)
Potassium: 4.1 mEq/L (ref 3.5–5.1)
Sodium: 138 mEq/L (ref 135–145)
Total Protein: 7 g/dL (ref 6.0–8.3)

## 2010-11-02 LAB — CBC
Hemoglobin: 9.4 g/dL — ABNORMAL LOW (ref 13.0–17.0)
MCV: 83.1 fL (ref 78.0–100.0)
Platelets: 289 10*3/uL (ref 150–400)
RBC: 3.08 MIL/uL — ABNORMAL LOW (ref 4.22–5.81)
WBC: 18.1 10*3/uL — ABNORMAL HIGH (ref 4.0–10.5)

## 2010-11-02 LAB — RETICULOCYTES: Retic Count, Absolute: 796.2 10*3/uL — ABNORMAL HIGH (ref 19.0–186.0)

## 2010-11-03 LAB — CBC
HCT: 24.1 % — ABNORMAL LOW (ref 39.0–52.0)
MCV: 82 fL (ref 78.0–100.0)
RBC: 2.94 MIL/uL — ABNORMAL LOW (ref 4.22–5.81)
WBC: 13.8 10*3/uL — ABNORMAL HIGH (ref 4.0–10.5)

## 2010-11-03 LAB — COMPREHENSIVE METABOLIC PANEL
ALT: 32 U/L (ref 0–53)
AST: 69 U/L — ABNORMAL HIGH (ref 0–37)
Alkaline Phosphatase: 79 U/L (ref 39–117)
CO2: 28 mEq/L (ref 19–32)
Chloride: 102 mEq/L (ref 96–112)
GFR calc non Af Amer: >60 mL/min (ref >60)
Sodium: 136 mEq/L (ref 135–145)
Total Bilirubin: 2.5 mg/dL — ABNORMAL HIGH (ref 0.3–1.2)

## 2010-11-03 LAB — RETICULOCYTES: Retic Count, Absolute: 749.7 10*3/uL — ABNORMAL HIGH (ref 19.0–186.0)

## 2010-11-04 LAB — COMPREHENSIVE METABOLIC PANEL
Alkaline Phosphatase: 83 U/L (ref 39–117)
BUN: 8 mg/dL (ref 6–23)
Calcium: 9 mg/dL (ref 8.4–10.5)
GFR calc Af Amer: >60 mL/min (ref >60)
Glucose, Bld: 111 mg/dL — ABNORMAL HIGH (ref 70–99)
Total Protein: 6.8 g/dL (ref 6.0–8.3)

## 2010-11-04 LAB — CBC
HCT: 24.3 % — ABNORMAL LOW (ref 39.0–52.0)
RDW: 21.6 % — ABNORMAL HIGH (ref 11.5–15.5)
WBC: 10.8 10*3/uL — ABNORMAL HIGH (ref 4.0–10.5)

## 2010-11-05 ENCOUNTER — Inpatient Hospital Stay (HOSPITAL_COMMUNITY): Payer: Medicare Other

## 2010-11-06 ENCOUNTER — Inpatient Hospital Stay (HOSPITAL_COMMUNITY): Payer: Medicare Other

## 2010-11-06 LAB — BASIC METABOLIC PANEL
CO2: 30 mEq/L (ref 19–32)
Calcium: 9.6 mg/dL (ref 8.4–10.5)
Chloride: 99 mEq/L (ref 96–112)
Glucose, Bld: 97 mg/dL (ref 70–99)
Potassium: 4.9 mEq/L (ref 3.5–5.1)
Sodium: 136 mEq/L (ref 135–145)

## 2010-11-06 LAB — CBC
Hemoglobin: 9.3 g/dL — ABNORMAL LOW (ref 13.0–17.0)
MCH: 30.7 pg (ref 26.0–34.0)
RBC: 3.03 MIL/uL — ABNORMAL LOW (ref 4.22–5.81)

## 2010-11-08 LAB — CBC
HCT: 24.2 % — ABNORMAL LOW (ref 39.0–52.0)
Hemoglobin: 8.8 g/dL — ABNORMAL LOW (ref 13.0–17.0)
MCHC: 36.4 g/dL — ABNORMAL HIGH (ref 30.0–36.0)
Platelets: 281 10*3/uL (ref 150–400)
RBC: 2.82 MIL/uL — ABNORMAL LOW (ref 4.22–5.81)
RDW: 22.8 % — ABNORMAL HIGH (ref 11.5–15.5)

## 2010-11-08 LAB — COMPREHENSIVE METABOLIC PANEL
ALT: 26 U/L (ref 0–53)
AST: 49 U/L — ABNORMAL HIGH (ref 0–37)
Albumin: 3.5 g/dL (ref 3.5–5.2)
Calcium: 9.7 mg/dL (ref 8.4–10.5)
GFR calc Af Amer: >60 mL/min (ref >60)
Potassium: 4.2 mEq/L (ref 3.5–5.1)
Sodium: 138 mEq/L (ref 135–145)
Total Protein: 7.3 g/dL (ref 6.0–8.3)

## 2010-11-08 LAB — PRO B NATRIURETIC PEPTIDE: Pro B Natriuretic peptide (BNP): 14.3 pg/mL (ref 0–125)

## 2010-11-09 NOTE — H&P (Signed)
NAME:  [name], [name]                ACCOUNT NO.:  [id number]  MEDICAL RECORD NO.:  [id number]  LOCATION:  WLED                         FACILITY:  [facility]  PHYSICIAN:  Kela Millin, M.D.DATE OF BIRTH:  [birth year]  DATE OF ADMISSION:  11/01/2010 DATE OF DISCHARGE:                             HISTORY & PHYSICAL   PRIMARY CARE PHYSICIAN:  Dr. Ronne Binning.  ONCOLOGIST:  Dr. Myna Hidalgo  CHIEF COMPLAINT:  Increasing pain x1 day and subjective fevers.  HISTORY OF PRESENT ILLNESS:  The patient is a 30 year old black male with past medical history significant for sickle cell with admissions for crisis in the past (last discharge from the hospital, April 2012), diastolic dysfunction, avascular necrosis of both shoulders per patient report in the past, history of elevated LFTs of unclear etiology, positional headache status post LP in the past who presents with the above complaints.  He states that he was in his usual state of health until yesterday when he began having increasing back and left shoulder pain and so he came to the ED today.  He admits to subjective fevers. He denies cough, shortness of breath, chest pain, nausea, vomiting, diarrhea, melena, dysuria, and no hematochezia.  He was seen in the ED and a chest x-ray revealed a small focal area of consolidation in the right lower lobe, probably representing pneumonia versus cough/atelectasis.  No peripheral wedge shaped opacities in the lungs to suggest pulmonary infarction.  His white cell count was 12.6 and his hemoglobin 10.8 and a retic count of 25.1, his urinalysis was negative for infection.  He is admitted for further evaluation and management.  PAST MEDICAL HISTORY:  As above.  MEDICATIONS: 1. OxyContin 80 mg b.i.d. 2. Oxycodone 30 mg q.4 h p.r.n. 3. Folic acid 1 mg daily. 4. Hydroxyurea 1000 mg p.o. b.i.d.  Per his last discharge summary: 1. Aspirin 81 mg daily. 2. Lactulose 30 cc q.6 h p.r.n. 3. Also Celebrex 100  mg p.o. b.i.d. p.r.n.  ALLERGIES:  MORPHINE causes itching.  SOCIAL HISTORY:  He denies tobacco, occasional alcohol.  FAMILY HISTORY:  Reviewed and noncontributory to current illness.  PHYSICAL EXAM:  GENERAL:  In general, the patient is a young black male, questioning himself intermittently, in no respiratory distress. VITAL SIGNS:  Temperature is 98.9, initially 100; ,his blood pressure is 133/70; pulse 98; respiratory rate of 18; O2 sat is 93%. HEENT:  PERRL, EOMI, slightly dry mucous membranes.  No oral exudates. NECK:  Supple, no adenopathy, no thyromegaly, no JVD. LUNGS:  Decreased breath sounds at bases, otherwise, no crackles and no wheezes. CARDIOVASCULAR:  Regular rate and rhythm.  Normal S1, S2. ABDOMEN:  Soft, bowel sounds present, nontender, nondistended.  No organomegaly and no masses palpable. EXTREMITIES:  No cyanosis, clubbing, or edema. NEURO:  Alert and oriented x3.  Cranial nerves II through XII grossly intact.  Nonfocal exam.  LABORATORY DATA:  Chest x-ray as per HPI.  LABORATORY DATA:  Sodium is 139 with a potassium of 4.2, chloride 106, CO2 of 23, glucose 95, BUN 13, creatinine 0.90, calcium 9.3, total protein is 7.7, albumin is 4.3.  Total bili is 2.9, alkaline phosphatase is 115, SGOT is 48 and SGPT is  22, total protein is 7.7, albumin is 4.3, calcium is 9.3.  White cell count 12.6 with a hemoglobin of 10.8, hematocrit of 31.2, platelet count 295.  Urinalysis negative for infection.  ASSESSMENT AND PLAN: 1. Probable right lower lobe pneumonia, community acquired - we will     place on empiric antibiotics, Mucinex, and follow. 2. Sickle cell pain crisis - possibly precipitated by #1.  Will     hydrate with IV fluids, IV analgesics for pain management.  I have     notified Dr. Myna Hidalgo of the patient's admission. 3. Elevated liver function tests - he has a prior history of elevated     LFTs of unclear etiology, we will follow and recheck. 4. History of  diastolic dysfunction - no evidence of fluid  overload.     Followup.     Kela Millin, M.D.     ACV/MEDQ  D:  11/01/2010  T:  11/01/2010  Job:  [id number]  cc:   Lorelle Formosa, M.D. Fax: [phone]  Josph Macho, M.D. Fax: [phone]  Electronically Signed by Donnalee Curry M.D. on 11/09/2010 09:43:37 PM

## 2010-11-10 ENCOUNTER — Inpatient Hospital Stay (HOSPITAL_COMMUNITY): Payer: Medicare Other

## 2010-11-10 LAB — CBC
HCT: 24.4 % — ABNORMAL LOW (ref 39.0–52.0)
Hemoglobin: 8.8 g/dL — ABNORMAL LOW (ref 13.0–17.0)
MCH: 32 pg (ref 26.0–34.0)
MCHC: 36.1 g/dL — ABNORMAL HIGH (ref 30.0–36.0)
MCV: 88.7 fL (ref 78.0–100.0)

## 2010-11-13 ENCOUNTER — Inpatient Hospital Stay (HOSPITAL_COMMUNITY): Payer: Medicare Other

## 2010-11-13 LAB — COMPREHENSIVE METABOLIC PANEL
Alkaline Phosphatase: 130 U/L — ABNORMAL HIGH (ref 39–117)
BUN: 10 mg/dL (ref 6–23)
CO2: 27 mEq/L (ref 19–32)
Chloride: 103 mEq/L (ref 96–112)
Creatinine, Ser: 0.69 mg/dL (ref 0.50–1.35)
GFR calc Af Amer: >60 mL/min (ref >60)
GFR calc non Af Amer: >60 mL/min (ref >60)
Glucose, Bld: 117 mg/dL — ABNORMAL HIGH (ref 70–99)
Potassium: 3.9 mEq/L (ref 3.5–5.1)
Total Bilirubin: 1.2 mg/dL (ref 0.3–1.2)

## 2010-11-13 LAB — CBC
MCH: 32.8 pg (ref 26.0–34.0)
MCHC: 35 g/dL (ref 30.0–36.0)
MCV: 93.6 fL (ref 78.0–100.0)
Platelets: 375 10*3/uL (ref 150–400)
RBC: 2.5 MIL/uL — ABNORMAL LOW (ref 4.22–5.81)

## 2010-11-14 LAB — CBC
MCV: 94.2 fL (ref 78.0–100.0)
Platelets: 397 10*3/uL (ref 150–400)
RBC: 2.75 MIL/uL — ABNORMAL LOW (ref 4.22–5.81)
RDW: 24.7 % — ABNORMAL HIGH (ref 11.5–15.5)
WBC: 6.7 10*3/uL (ref 4.0–10.5)

## 2010-11-15 LAB — COMPREHENSIVE METABOLIC PANEL
ALT: 52 U/L (ref 0–53)
Alkaline Phosphatase: 116 U/L (ref 39–117)
Chloride: 103 mEq/L (ref 96–112)
GFR calc Af Amer: >60 mL/min (ref >60)
Glucose, Bld: 90 mg/dL (ref 70–99)
Potassium: 4.3 mEq/L (ref 3.5–5.1)
Sodium: 137 mEq/L (ref 135–145)
Total Bilirubin: 1.1 mg/dL (ref 0.3–1.2)
Total Protein: 7.4 g/dL (ref 6.0–8.3)

## 2010-11-15 LAB — CROSSMATCH: Unit division: 0

## 2010-11-15 LAB — CBC
Hemoglobin: 10.5 g/dL — ABNORMAL LOW (ref 13.0–17.0)
MCHC: 35 g/dL (ref 30.0–36.0)
RBC: 3.25 MIL/uL — ABNORMAL LOW (ref 4.22–5.81)
WBC: 6.9 10*3/uL (ref 4.0–10.5)

## 2010-11-16 LAB — CBC
HCT: 28.2 % — ABNORMAL LOW (ref 39.0–52.0)
MCV: 94.9 fL (ref 78.0–100.0)
Platelets: 411 10*3/uL — ABNORMAL HIGH (ref 150–400)

## 2010-11-16 LAB — COMPREHENSIVE METABOLIC PANEL
ALT: 39 U/L (ref 0–53)
AST: 41 U/L — ABNORMAL HIGH (ref 0–37)
Alkaline Phosphatase: 113 U/L (ref 39–117)
CO2: 28 mEq/L (ref 19–32)
Calcium: 9.5 mg/dL (ref 8.4–10.5)
Chloride: 105 mEq/L (ref 96–112)
GFR calc Af Amer: >60 mL/min (ref >60)
GFR calc non Af Amer: >60 mL/min (ref >60)
Glucose, Bld: 91 mg/dL (ref 70–99)
Potassium: 4 mEq/L (ref 3.5–5.1)
Sodium: 139 mEq/L (ref 135–145)
Total Bilirubin: 1.4 mg/dL — ABNORMAL HIGH (ref 0.3–1.2)

## 2010-11-17 LAB — COMPREHENSIVE METABOLIC PANEL
ALT: 32 U/L (ref 0–53)
Albumin: 3.4 g/dL — ABNORMAL LOW (ref 3.5–5.2)
Alkaline Phosphatase: 123 U/L — ABNORMAL HIGH (ref 39–117)
Calcium: 9.5 mg/dL (ref 8.4–10.5)
GFR calc Af Amer: >60 mL/min (ref >60)
Glucose, Bld: 88 mg/dL (ref 70–99)
Potassium: 3.9 mEq/L (ref 3.5–5.1)
Sodium: 138 mEq/L (ref 135–145)
Total Protein: 7.2 g/dL (ref 6.0–8.3)

## 2010-11-17 LAB — CBC
MCH: 32.7 pg (ref 26.0–34.0)
MCHC: 34.2 g/dL (ref 30.0–36.0)
Platelets: 443 10*3/uL — ABNORMAL HIGH (ref 150–400)
RDW: 26.1 % — ABNORMAL HIGH (ref 11.5–15.5)

## 2010-12-01 ENCOUNTER — Other Ambulatory Visit: Payer: Self-pay | Admitting: Hematology & Oncology

## 2010-12-01 ENCOUNTER — Encounter (HOSPITAL_BASED_OUTPATIENT_CLINIC_OR_DEPARTMENT_OTHER): Payer: Medicare Other | Admitting: Hematology & Oncology

## 2010-12-01 DIAGNOSIS — D571 Sickle-cell disease without crisis: Secondary | ICD-10-CM

## 2010-12-01 LAB — CBC WITH DIFFERENTIAL (CANCER CENTER ONLY)
BASO#: 0.1 10*3/uL (ref 0.0–0.2)
EOS%: 2.2 % (ref 0.0–7.0)
HCT: 36.9 % — ABNORMAL LOW (ref 38.7–49.9)
HGB: 13.6 g/dL (ref 13.0–17.1)
LYMPH#: 2.4 10*3/uL (ref 0.9–3.3)
LYMPH%: 40.7 % (ref 14.0–48.0)
MCHC: 36.9 g/dL — ABNORMAL HIGH (ref 32.0–35.9)
MCV: 94 fL (ref 82–98)
MONO#: 0.7 10*3/uL (ref 0.1–0.9)
NEUT%: 43.4 % (ref 40.0–80.0)
RDW: 20 % — ABNORMAL HIGH (ref 11.1–15.7)

## 2010-12-07 LAB — COMPREHENSIVE METABOLIC PANEL
Albumin: 4.7 g/dL (ref 3.5–5.2)
Alkaline Phosphatase: 102 U/L (ref 39–117)
BUN: 12 mg/dL (ref 6–23)
CO2: 21 mEq/L (ref 19–32)
Glucose, Bld: 87 mg/dL (ref 70–99)
Sodium: 138 mEq/L (ref 135–145)
Total Bilirubin: 1.4 mg/dL — ABNORMAL HIGH (ref 0.3–1.2)
Total Protein: 7.9 g/dL (ref 6.0–8.3)

## 2010-12-07 LAB — HEMOGLOBINOPATHY EVALUATION
Hemoglobin Other: 0 % (ref 0.0–0.0)
Hgb A2 Quant: 3.1 % (ref 2.2–3.2)
Hgb A: 13.1 % — ABNORMAL LOW (ref 96.8–97.8)
Hgb F Quant: 15.7 % — ABNORMAL HIGH (ref 0.0–2.0)

## 2010-12-07 LAB — IRON AND TIBC
%SAT: 33 % (ref 20–55)
TIBC: 290 ug/dL (ref 215–435)

## 2010-12-07 LAB — RETICULOCYTES (CHCC)
ABS Retic: 140.4 10*3/uL (ref 19.0–186.0)
RBC.: 4.01 MIL/uL — ABNORMAL LOW (ref 4.22–5.81)

## 2010-12-21 NOTE — Discharge Summary (Signed)
[name], BATHE                ACCOUNT NO.:  [id number]  MEDICAL RECORD NO.:  [id number]  LOCATION:  1503                         FACILITY:  [facility]  PHYSICIAN:  Brynnly Bonet L. August Saucer, M.D.     DATE OF BIRTH:  [birth year]  DATE OF ADMISSION:  11/01/2010 DATE OF DISCHARGE:  11/18/2010                              DISCHARGE SUMMARY   FINAL DIAGNOSES: 1. Sickle cell crisis. 2. Bilateral pneumonia. 3. Acute chest syndrome. 4. Hypoxemia secondary to sickle cell crisis and bilateral pneumonia.  OPERATIONS AND PROCEDURES:  Exchange transfusions x2.  HISTORY OF PRESENT ILLNESS:  The patient is a 30 year old single black male with a history of sickle cell anemia with admissions for crisis in the past, with history of diastolic dysfunction, avascular necrosis of both shoulders, and history of elevated LFTs.  Patient was in his usual state of health until 1 day prior to admission when he had increasing back and left shoulder pain and came to the emergency room for treatment.  Patient admits to have subjective fevers.  He denies cough, shortness breath with chest pain.  He denies nausea, vomiting, diarrhea, melena, dysuria, or hematochezia.  A chest x-ray in the emergency room showed a focal area of consolidation in the right lower lobe suggestive of pneumonia.  No other changes suggestive of pulmonary function. Patient was evaluated in the emergency room.  He was subsequently admitted by the hospitalist service for further evaluation and treatment.  Past medical history and physical exam is per admission H and P.  HOSPITAL COURSE:  The patient was admitted for further treatment of probable right lower lobe pneumonia.  He was having associated sickle cell crisis at that time as well.  He was admitted and started on IV fluids.  He was placed on empiric antibiotics as well as Mucinex.  He was given IV Dilaudid for pain control as well.  Over the subsequent days, patient had a slow, but steady  improvement. He was transferred to my service on November 02, 2010.  He was noted to have clinical evidence of a slowly resolving crisis.  He continued, however, to have chest pain and abnormal lung sounds consistent with a right lower lobe pneumonia.  Patient was noted have hypoxemia as well.  He was placed on an incentive spirometer and encouraged to use it every 2 hours.  Patient despite this continued to have significant chest pain, this was associated with productive cough at times as well.  Patient was continued on his incentive spirometry.  He was given Zithromax as well for the possibility of community-acquired pneumonia.  Despite these measures, patient had ongoing problems with hypoxemia and chest pain.  Repeated x-ray demonstrated changes in the right lower lobe as well as a left lower lobe.  With the sickle cell issues were ongoing, this patient became more consistent with an acute chest syndrome. Patient thereafter underwent exchange transfusions x2, which did help him a great deal.  He was continued on supportive measures.  He, however, gradually made steady improvement.  By August 8, he was doing well.  On August 9; however, he had an exacerbation of his pain with subsequent changing in weather.  This, however,  by August 10, had decreased back to of 4-6/10.  Patient was monitored for an additional of 24 hours.  He had no further exacerbation of his pain.  He subsequently felt to be stable for discharge.  MEDICATIONS AT THE TIME OF DISCHARGE:  Consisted of: 1. Mucinex 600 mg b.i.d. 2. Aspirin 81 mg daily. 3. Phenergan 12.5 mg q.4 hours p.r.n. 4. Folic acid 1 mg daily. 5. Hydroxyurea 500 mg 2 capsules twice a day. 6. Lactulose 30 cc daily p.r.n. constipation. 7. OxyContin 80 mg b.i.d. 8. Oxycodone 15 mg q. 6 hours.  Patient will be maintained on a regular diet.  He is to follow up with Dr. Ronne Binning in 1 weeks' time.          ______________________________ Lind Guest  August Saucer, M.D.     ELD/MEDQ  D:  12/20/2010  T:  12/21/2010  Job:  [id number]  Electronically Signed by Willey Blade M.D. on 12/21/2010 11:56:49 AM

## 2011-01-19 ENCOUNTER — Other Ambulatory Visit: Payer: Self-pay | Admitting: Hematology & Oncology

## 2011-01-19 ENCOUNTER — Ambulatory Visit (HOSPITAL_BASED_OUTPATIENT_CLINIC_OR_DEPARTMENT_OTHER): Payer: Medicare Other | Admitting: Hematology & Oncology

## 2011-01-19 DIAGNOSIS — D571 Sickle-cell disease without crisis: Secondary | ICD-10-CM

## 2011-01-19 DIAGNOSIS — Z23 Encounter for immunization: Secondary | ICD-10-CM

## 2011-01-19 LAB — CBC WITH DIFFERENTIAL (CANCER CENTER ONLY)
Eosinophils Absolute: 0.1 10*3/uL (ref 0.0–0.5)
LYMPH%: 27.4 % (ref 14.0–48.0)
MCH: 32.7 pg (ref 28.0–33.4)
MCV: 86 fL (ref 82–98)
MONO%: 11 % (ref 0.0–13.0)
NEUT%: 60 % (ref 40.0–80.0)
Platelets: 283 10*3/uL (ref 145–400)
RBC: 4.07 10*6/uL — ABNORMAL LOW (ref 4.20–5.70)

## 2011-01-19 LAB — CHCC SATELLITE - SMEAR

## 2011-01-23 LAB — HEMOGLOBINOPATHY EVALUATION
Hemoglobin Other: 0 % (ref 0.0–0.0)
Hgb A: 6.5 % — ABNORMAL LOW (ref 96.8–97.8)

## 2011-01-23 LAB — IRON AND TIBC
%SAT: 40 % (ref 20–55)
TIBC: 312 ug/dL (ref 215–435)
UIBC: 188 ug/dL (ref 125–400)

## 2011-01-23 LAB — RETICULOCYTES (CHCC)
ABS Retic: 621.6 10*3/uL — ABNORMAL HIGH (ref 19.0–186.0)
RBC.: 4.01 MIL/uL — ABNORMAL LOW (ref 4.22–5.81)
Retic Ct Pct: 15.5 % — ABNORMAL HIGH (ref 0.4–2.3)

## 2011-01-23 LAB — LACTATE DEHYDROGENASE: LDH: 277 U/L — ABNORMAL HIGH (ref 94–250)

## 2011-02-21 MED ORDER — OXYCODONE HCL 80 MG PO TB12: 80.0000 mg | ORAL_TABLET | Freq: Two times a day (BID) | ORAL | Status: DC

## 2011-02-21 MED ORDER — OXYCODONE HCL 30 MG PO TABS: ORAL_TABLET | ORAL | Status: DC

## 2011-02-21 NOTE — Progress Notes (Signed)
Pt called requesting a rx for Oxycodone and Oxycontin. Upon reviewing his rx history in the chart it showed that he hadn't had a refill from the office since Sept. Obtained Narcotic Report from pharmacy. Last rx's were obtained from Dr Ronne Binning. Dr Myna Hidalgo called the pt to discuss this and he was told that he was only to obtain Narcotic rx from him. Rx for both medications were placed at the front desk for pick up after Dr Myna Hidalgo approved & signed the refill.

## 2011-03-23 ENCOUNTER — Other Ambulatory Visit: Payer: Self-pay | Admitting: *Deleted

## 2011-03-23 DIAGNOSIS — D571 Sickle-cell disease without crisis: Secondary | ICD-10-CM

## 2011-03-23 MED ORDER — OXYCODONE HCL 80 MG PO TB12: 80.0000 mg | ORAL_TABLET | Freq: Two times a day (BID) | ORAL | Status: DC

## 2011-03-23 MED ORDER — OXYCODONE HCL 30 MG PO TABS: ORAL_TABLET | ORAL | Status: DC

## 2011-03-23 NOTE — Telephone Encounter (Signed)
Pt called needing a refill for Oxycontin and Roxicodone. Reviewed chart, pt is on schedule for refill. Will ask Dr Myna Hidalgo to sign a rx then place at front desk for refill.

## 2011-04-13 ENCOUNTER — Other Ambulatory Visit: Payer: Self-pay | Admitting: Hematology & Oncology

## 2011-04-13 ENCOUNTER — Ambulatory Visit (HOSPITAL_BASED_OUTPATIENT_CLINIC_OR_DEPARTMENT_OTHER): Payer: Medicare Other | Admitting: Hematology & Oncology

## 2011-04-13 ENCOUNTER — Other Ambulatory Visit (HOSPITAL_BASED_OUTPATIENT_CLINIC_OR_DEPARTMENT_OTHER): Payer: Medicare Other | Admitting: Lab

## 2011-04-13 ENCOUNTER — Telehealth: Payer: Self-pay | Admitting: *Deleted

## 2011-04-13 DIAGNOSIS — D571 Sickle-cell disease without crisis: Secondary | ICD-10-CM

## 2011-04-13 DIAGNOSIS — D5720 Sickle-cell/Hb-C disease without crisis: Secondary | ICD-10-CM

## 2011-04-13 DIAGNOSIS — M8700 Idiopathic aseptic necrosis of unspecified bone: Secondary | ICD-10-CM

## 2011-04-13 DIAGNOSIS — M25559 Pain in unspecified hip: Secondary | ICD-10-CM

## 2011-04-13 LAB — CBC WITH DIFFERENTIAL (CANCER CENTER ONLY)
BASO#: 0.1 10*3/uL (ref 0.0–0.2)
EOS%: 3.4 % (ref 0.0–7.0)
Eosinophils Absolute: 0.3 10*3/uL (ref 0.0–0.5)
HCT: 28.5 % — ABNORMAL LOW (ref 38.7–49.9)
HGB: 10.6 g/dL — ABNORMAL LOW (ref 13.0–17.1)
LYMPH#: 3.7 10*3/uL — ABNORMAL HIGH (ref 0.9–3.3)
MCHC: 37.2 g/dL — ABNORMAL HIGH (ref 32.0–35.9)
MONO#: 1.2 10*3/uL — ABNORMAL HIGH (ref 0.1–0.9)
NEUT%: 45.1 % (ref 40.0–80.0)
RBC: 3.25 10*6/uL — ABNORMAL LOW (ref 4.20–5.70)

## 2011-04-13 NOTE — Progress Notes (Signed)
DIAGNOSIS:  Hemoglobin SS disease.  CURRENT THERAPY: 1. Folic acid 1 mg p.o. daily. 2. Hydrea 500 mg p.o. b.i.d.  INTERIM HISTORY:  [name] comes in for his followup.  He is doing quite well.  He was only hospitalized 3 times last year.  For him, that is quite good.  He said he is having a little problem with his right hip.  He is taking his pain medication, which has helped.  He is trying to take a little bit more in the way of fluids at home.  He has had no fever.  He has had no cough.  He has had no abdominal pain.  He has had no fevers, sweats or chills.  His last ferritin back in October was 596.  His last hemoglobin F level back in October also was 17%.  PHYSICAL EXAMINATION:  This is a well-developed, well-nourished black male in no obvious distress.  Vital signs:  Temperature 98.4, pulse 65, respiratory rate 18, blood pressure 113/65.  Weight is 154.  Head and neck:  Shows a normocephalic, atraumatic skull.  There are no ocular or oral lesions.  There are no palpable cervical or supraclavicular lymph nodes.  Lungs:  Clear bilaterally.  Cardiac:  Regular rate and rhythm with a normal S1, S2.  There are no murmurs, rubs or bruits.  Abdomen: Soft with good bowel sounds.  There is no palpable abdominal mass. There is no fluid wave.  There is no palpable hepatosplenomegaly.  Back: No tenderness over the spine, ribs, or hips.  He may have some slight tenderness to deep palpation of the right lateral hip.  There may be some slight pain with hip abduction.  Extremities:  No clubbing, cyanosis or edema.  Skin:  No ulcerations on his lower legs. Neurologic:  No focal neurological deficits.  LABORATORY STUDIES:  White cell count is 8.8, hemoglobin 10.6, hematocrit 28.5, platelet count 271.  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  His hemoglobin F level is quite high, so this really should be __________ for him.  He is on OxyContin  80 mg twice a day.  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  The OxyContin does make him functional and allows him to have a good quality of life.  He also is on oxycodone 30 mg p.o. q.6 hours p.r.n. for any type of flare-ups or "crises."  We will plan to get back in a couple months.  I told him that if he does have any further problems with the hip or if he thinks he is developing a crisis, to let us know so we get him in for a "mini" exchange.    ______________________________ Josph Macho, M.D. PRE/MEDQ  D:  04/13/2011  T:  04/13/2011  Job:  [id number]  ADDENDUM: Ferritin is 192.

## 2011-04-13 NOTE — Progress Notes (Signed)
This office note has been dictated.

## 2011-04-13 NOTE — Telephone Encounter (Signed)
Mailed 05-2011 schedule °

## 2011-04-16 ENCOUNTER — Inpatient Hospital Stay (HOSPITAL_COMMUNITY)
Admission: EM | Admit: 2011-04-16 | Discharge: 2011-04-20 | DRG: 811 | Disposition: A | Discharge status: Home / Self Care | Payer: Medicare Other | Attending: Internal Medicine | Admitting: Internal Medicine

## 2011-04-16 ENCOUNTER — Emergency Department (HOSPITAL_COMMUNITY): Payer: Medicare Other

## 2011-04-16 DIAGNOSIS — D571 Sickle-cell disease without crisis: Secondary | ICD-10-CM

## 2011-04-16 DIAGNOSIS — Y998 Other external cause status: Secondary | ICD-10-CM

## 2011-04-16 DIAGNOSIS — Z79899 Other long term (current) drug therapy: Secondary | ICD-10-CM

## 2011-04-16 DIAGNOSIS — F29 Unspecified psychosis not due to a substance or known physiological condition: Secondary | ICD-10-CM | POA: Diagnosis present

## 2011-04-16 DIAGNOSIS — T40605A Adverse effect of unspecified narcotics, initial encounter: Secondary | ICD-10-CM | POA: Diagnosis present

## 2011-04-16 DIAGNOSIS — D72829 Elevated white blood cell count, unspecified: Secondary | ICD-10-CM | POA: Diagnosis present

## 2011-04-16 DIAGNOSIS — L298 Other pruritus: Secondary | ICD-10-CM | POA: Diagnosis present

## 2011-04-16 DIAGNOSIS — Z885 Allergy status to narcotic agent status: Secondary | ICD-10-CM

## 2011-04-16 DIAGNOSIS — L299 Pruritus, unspecified: Secondary | ICD-10-CM | POA: Diagnosis present

## 2011-04-16 DIAGNOSIS — D5700 Hb-SS disease with crisis, unspecified: Principal | ICD-10-CM | POA: Diagnosis present

## 2011-04-16 DIAGNOSIS — Z22322 Carrier or suspected carrier of Methicillin resistant Staphylococcus aureus: Secondary | ICD-10-CM

## 2011-04-16 DIAGNOSIS — L2989 Other pruritus: Secondary | ICD-10-CM | POA: Diagnosis present

## 2011-04-16 DIAGNOSIS — Y9229 Other specified public building as the place of occurrence of the external cause: Secondary | ICD-10-CM

## 2011-04-16 DIAGNOSIS — J189 Pneumonia, unspecified organism: Secondary | ICD-10-CM | POA: Diagnosis present

## 2011-04-16 DIAGNOSIS — IMO0002 Reserved for concepts with insufficient information to code with codable children: Secondary | ICD-10-CM | POA: Diagnosis present

## 2011-04-16 HISTORY — DX: Sickle-cell disease without crisis: D57.1

## 2011-04-16 LAB — CBC
HCT: 30.1 % — ABNORMAL LOW (ref 39.0–52.0)
MCHC: 37.2 g/dL — ABNORMAL HIGH (ref 30.0–36.0)
Platelets: 271 10*3/uL (ref 150–400)
RDW: 24.3 % — ABNORMAL HIGH (ref 11.5–15.5)
WBC: 12.9 10*3/uL — ABNORMAL HIGH (ref 4.0–10.5)

## 2011-04-16 LAB — POCT I-STAT, CHEM 8
BUN: 14 mg/dL (ref 6–23)
Calcium, Ion: 1.21 mmol/L (ref 1.12–1.32)
Creatinine, Ser: 1.1 mg/dL (ref 0.50–1.35)
Glucose, Bld: 122 mg/dL — ABNORMAL HIGH (ref 70–99)
Hemoglobin: 11.6 g/dL — ABNORMAL LOW (ref 13.0–17.0)
TCO2: 24 mmol/L (ref 0–100)

## 2011-04-16 LAB — CULTURE, BLOOD (ROUTINE X 2): Culture: NO GROWTH

## 2011-04-16 LAB — DIFFERENTIAL
Basophils Absolute: 0.1 10*3/uL (ref 0.0–0.1)
Eosinophils Absolute: 0.3 10*3/uL (ref 0.0–0.7)
Lymphocytes Relative: 13 % (ref 12–46)
Monocytes Relative: 11 % (ref 3–12)
Neutro Abs: 9.4 10*3/uL — ABNORMAL HIGH (ref 1.7–7.7)
Neutrophils Relative %: 73 % (ref 43–77)

## 2011-04-16 LAB — RETICULOCYTES: Retic Ct Pct: 27 % — ABNORMAL HIGH (ref 0.4–3.1)

## 2011-04-16 MED ORDER — ENOXAPARIN SODIUM 40 MG/0.4ML ~~LOC~~ SOLN
40.0000 mg | SUBCUTANEOUS | Status: DC
  Administered 2011-04-16 – 2011-04-19 (×4): 40 mg via SUBCUTANEOUS
  Filled 2011-04-16 (×6): qty 0.4

## 2011-04-16 MED ORDER — DIPHENHYDRAMINE HCL 50 MG/ML IJ SOLN
12.5000 mg | INTRAMUSCULAR | Status: DC | PRN
  Administered 2011-04-17 (×2): 12.5 mg via INTRAVENOUS
  Filled 2011-04-16 (×2): qty 1

## 2011-04-16 MED ORDER — DEXTROSE 5 % IV SOLN
1.0000 g | INTRAVENOUS | Status: DC
  Administered 2011-04-17 – 2011-04-20 (×4): 1 g via INTRAVENOUS
  Filled 2011-04-16 (×4): qty 10

## 2011-04-16 MED ORDER — ACETAMINOPHEN 325 MG PO TABS
650.0000 mg | ORAL_TABLET | Freq: Four times a day (QID) | ORAL | Status: DC | PRN
  Administered 2011-04-17: 650 mg via ORAL
  Filled 2011-04-16: qty 2

## 2011-04-16 MED ORDER — POTASSIUM CHLORIDE IN NACL 20-0.9 MEQ/L-% IV SOLN
INTRAVENOUS | Status: DC
  Administered 2011-04-16: 1000 mL via INTRAVENOUS
  Administered 2011-04-16 – 2011-04-17 (×3): via INTRAVENOUS
  Administered 2011-04-18 – 2011-04-19 (×3): 125 mL via INTRAVENOUS
  Administered 2011-04-20: 04:00:00 via INTRAVENOUS
  Filled 2011-04-16 (×16): qty 1000

## 2011-04-16 MED ORDER — MEPERIDINE HCL 50 MG/ML IJ SOLN
50.0000 mg | INTRAMUSCULAR | Status: DC | PRN
  Administered 2011-04-16 – 2011-04-17 (×7): 50 mg via INTRAVENOUS
  Filled 2011-04-16 (×7): qty 1

## 2011-04-16 MED ORDER — POTASSIUM CHLORIDE IN NACL 20-0.9 MEQ/L-% IV SOLN
INTRAVENOUS | Status: AC
  Administered 2011-04-16: 1000 mL via INTRAVENOUS
  Filled 2011-04-16: qty 1000

## 2011-04-16 MED ORDER — DEXTROSE 5 % IV SOLN
1.0000 g | Freq: Once | INTRAVENOUS | Status: AC
  Administered 2011-04-16: 1 g via INTRAVENOUS
  Filled 2011-04-16: qty 10

## 2011-04-16 MED ORDER — CHLORHEXIDINE GLUCONATE CLOTH 2 % EX PADS
6.0000 | MEDICATED_PAD | Freq: Every day | CUTANEOUS | Status: DC
  Administered 2011-04-17 – 2011-04-20 (×4): 6 via TOPICAL

## 2011-04-16 MED ORDER — OXYCODONE HCL 40 MG PO TB12
80.0000 mg | ORAL_TABLET | Freq: Two times a day (BID) | ORAL | Status: DC
  Administered 2011-04-16 – 2011-04-20 (×9): 80 mg via ORAL
  Filled 2011-04-16 (×2): qty 4
  Filled 2011-04-16 (×2): qty 2
  Filled 2011-04-16 (×2): qty 4
  Filled 2011-04-16: qty 1
  Filled 2011-04-16 (×2): qty 4
  Filled 2011-04-16: qty 3

## 2011-04-16 MED ORDER — ACETAMINOPHEN 650 MG RE SUPP: 650.0000 mg | Freq: Four times a day (QID) | RECTAL | Status: DC | PRN

## 2011-04-16 MED ORDER — DIPHENHYDRAMINE HCL 50 MG/ML IJ SOLN
INTRAMUSCULAR | Status: AC
  Filled 2011-04-16: qty 1

## 2011-04-16 MED ORDER — HYDROMORPHONE HCL PF 1 MG/ML IJ SOLN
INTRAMUSCULAR | Status: AC
  Filled 2011-04-16: qty 1

## 2011-04-16 MED ORDER — VITAMINS A & D EX OINT
TOPICAL_OINTMENT | CUTANEOUS | Status: AC
  Administered 2011-04-16: 5
  Filled 2011-04-16: qty 5

## 2011-04-16 MED ORDER — ONDANSETRON HCL 4 MG PO TABS
4.0000 mg | ORAL_TABLET | Freq: Four times a day (QID) | ORAL | Status: DC | PRN
  Filled 2011-04-16: qty 1

## 2011-04-16 MED ORDER — METHYLPREDNISOLONE SODIUM SUCC 40 MG IJ SOLR
40.0000 mg | Freq: Once | INTRAMUSCULAR | Status: AC
  Administered 2011-04-16: 40 mg via INTRAVENOUS
  Filled 2011-04-16: qty 1

## 2011-04-16 MED ORDER — OXYCODONE HCL 5 MG PO TABS
5.0000 mg | ORAL_TABLET | ORAL | Status: DC | PRN
  Administered 2011-04-17 – 2011-04-18 (×4): 5 mg via ORAL
  Filled 2011-04-16 (×4): qty 1

## 2011-04-16 MED ORDER — DIPHENHYDRAMINE HCL 50 MG/ML IJ SOLN
25.0000 mg | INTRAMUSCULAR | Status: DC | PRN
  Administered 2011-04-16: 25 mg via INTRAVENOUS
  Filled 2011-04-16: qty 1

## 2011-04-16 MED ORDER — KETOROLAC TROMETHAMINE 30 MG/ML IJ SOLN
30.0000 mg | Freq: Once | INTRAMUSCULAR | Status: AC
  Administered 2011-04-16: 30 mg via INTRAVENOUS
  Filled 2011-04-16: qty 1

## 2011-04-16 MED ORDER — HYDROXYUREA 500 MG PO CAPS
1000.0000 mg | ORAL_CAPSULE | Freq: Every day | ORAL | Status: DC
  Administered 2011-04-16 – 2011-04-20 (×5): 1000 mg via ORAL
  Filled 2011-04-16 (×6): qty 2

## 2011-04-16 MED ORDER — DEXTROSE 5 % IV SOLN
500.0000 mg | Freq: Once | INTRAVENOUS | Status: AC
  Administered 2011-04-16: 500 mg via INTRAVENOUS
  Filled 2011-04-16: qty 500

## 2011-04-16 MED ORDER — ONDANSETRON HCL 4 MG/2ML IJ SOLN
4.0000 mg | Freq: Once | INTRAMUSCULAR | Status: AC
  Administered 2011-04-16: 4 mg via INTRAVENOUS
  Filled 2011-04-16: qty 2

## 2011-04-16 MED ORDER — ALUM & MAG HYDROXIDE-SIMETH 200-200-20 MG/5ML PO SUSP
30.0000 mL | Freq: Four times a day (QID) | ORAL | Status: DC | PRN
  Filled 2011-04-16: qty 30

## 2011-04-16 MED ORDER — ALBUTEROL SULFATE (5 MG/ML) 0.5% IN NEBU: 2.5000 mg | INHALATION_SOLUTION | RESPIRATORY_TRACT | Status: DC | PRN

## 2011-04-16 MED ORDER — FOLIC ACID 1 MG PO TABS
1.0000 mg | ORAL_TABLET | Freq: Every day | ORAL | Status: DC
  Administered 2011-04-16 – 2011-04-20 (×5): 1 mg via ORAL
  Filled 2011-04-16 (×6): qty 1

## 2011-04-16 MED ORDER — KETOROLAC TROMETHAMINE 30 MG/ML IJ SOLN
30.0000 mg | Freq: Four times a day (QID) | INTRAMUSCULAR | Status: DC | PRN
  Administered 2011-04-16 – 2011-04-17 (×5): 30 mg via INTRAVENOUS
  Filled 2011-04-16 (×4): qty 1

## 2011-04-16 MED ORDER — ONDANSETRON HCL 4 MG/2ML IJ SOLN: 4.0000 mg | Freq: Four times a day (QID) | INTRAMUSCULAR | Status: DC | PRN

## 2011-04-16 MED ORDER — HYDROMORPHONE HCL PF 2 MG/ML IJ SOLN
2.0000 mg | Freq: Once | INTRAMUSCULAR | Status: AC
  Administered 2011-04-16: 2 mg via INTRAVENOUS
  Filled 2011-04-16: qty 1

## 2011-04-16 MED ORDER — IPRATROPIUM BROMIDE 0.02 % IN SOLN: 0.5000 mg | RESPIRATORY_TRACT | Status: DC | PRN

## 2011-04-16 MED ORDER — DEXTROSE 5 % IV SOLN
500.0000 mg | INTRAVENOUS | Status: DC
  Administered 2011-04-17 – 2011-04-20 (×4): 500 mg via INTRAVENOUS
  Filled 2011-04-16 (×4): qty 500

## 2011-04-16 MED ORDER — DIPHENHYDRAMINE HCL 50 MG/ML IJ SOLN
12.5000 mg | Freq: Once | INTRAMUSCULAR | Status: AC
  Administered 2011-04-16: 12.5 mg via INTRAVENOUS
  Filled 2011-04-16: qty 1

## 2011-04-16 MED ORDER — SODIUM CHLORIDE 0.9 % IV BOLUS (SEPSIS)
500.0000 mL | Freq: Once | INTRAVENOUS | Status: AC
  Administered 2011-04-16: 500 mL via INTRAVENOUS

## 2011-04-16 MED ORDER — MUPIROCIN 2 % EX OINT
1.0000 "application " | TOPICAL_OINTMENT | Freq: Two times a day (BID) | CUTANEOUS | Status: DC
  Administered 2011-04-16 – 2011-04-20 (×9): 1 via NASAL
  Filled 2011-04-16 (×3): qty 22

## 2011-04-16 NOTE — ED Notes (Signed)
Pain meds given.  IV patent and infusing. Labs drawn.

## 2011-04-16 NOTE — Progress Notes (Signed)
Subjective: Feels better overnight, although still has pain in left shoulder, right hip and back. Pruritus is less.  Objective: Vital signs in last 24 hours: Temp:  [98.7 F (37.1 C)-98.9 F (37.2 C)] 98.8 F (37.1 C) (01/07 0800) Pulse Rate:  [79-102] 79  (01/07 0800) Resp:  [16-18] 17  (01/07 0800) BP: (116-135)/(58-76) 135/66 mmHg (01/07 0800) SpO2:  [85 %-100 %] 100 % (01/07 0800) FiO2 (%):  [4 %-100 %] 4 % (01/07 0800) Weight change:     Intake/Output from previous day:   Total I/O In: 75 [I.V.:75] Out: -    Physical Exam: General: Comfortable, alert, communicative, fully oriented, not short of breath at rest. Buccal mucosa looks "dry".  HEENT:  Mild clinical pallor, no jaundice, no conjunctival injection or discharge. NECK:  Supple, JVP not seen, no carotid bruits, no palpable lymphadenopathy, no palpable goiter. CHEST:  Clinically clear to auscultation, no wheezes, no crackles. HEART:  Sounds 1 and 2 heard, normal, regular, no murmurs. ABDOMEN:  Full, soft, non-tender, no palpable organomegaly, no palpable masses, normal bowel sounds. GENITALIA:  Not examined. LOWER EXTREMITIES:  No pitting edema, palpable peripheral pulses. MUSCULOSKELETAL SYSTEM:  Generalized osteoarthritic changes, otherwise, normal. CENTRAL NERVOUS SYSTEM:  No focal neurologic deficit on gross examination.  Lab Results:  Basename 04/16/11 0248 04/16/11 0200 04/13/11 1207  WBC -- 12.9* 8.8  HGB 11.6* 11.2* --  HCT 34.0* 30.1* --  PLT -- 271 271 Large platelets present    Basename 04/16/11 0248 04/13/11 1207  NA 139 137  K 3.6 4.4  CL 104 108  CO2 -- 19  GLUCOSE 122* 83  BUN 14 13  CREATININE 1.10 1.01  CALCIUM -- 8.9   No results found for this or any previous visit (from the past 240 hour(s)).   Studies/Results: Dg Chest 2 View  04/16/2011  *RADIOLOGY REPORT*  Clinical Data: Cough.  Short of breath.  CHEST - 2 VIEW  Comparison: 11/13/2010.  Findings: Low volume chest.  There is  diffuse interstitial prominence, greater than expected for the volumes of inspiration. Findings suggestive of atypical/mycoplasma pneumonia. Cholecystectomy clips are present in the right upper quadrant. Sickle cell changes are noted in the thoracolumbar spine.  The cardiopericardial silhouette is probably within normal limits allowing for volumes of inspiration.  IMPRESSION:  1.  Diffuse interstitial prominence suggesting atypical pneumonia/mycoplasma. 2.  Low volume chest accentuating the cardiopericardial silhouette. 3.  Sickle cell changes of the thoracic spine.  Original Report Authenticated By: Andreas Newport, M.D.    Medications: Scheduled Meds:   . 0.9 % NaCl with KCl 20 mEq / L      . azithromycin  500 mg Intravenous Once  . azithromycin  500 mg Intravenous Q24H  . cefTRIAXone (ROCEPHIN)  IV  1 g Intravenous Once  . cefTRIAXone (ROCEPHIN)  IV  1 g Intravenous Q24H  . diphenhydrAMINE  12.5 mg Intravenous Once  . enoxaparin  40 mg Subcutaneous Q24H  . folic acid  1 mg Oral Daily  .  HYDROmorphone (DILAUDID) injection  2 mg Intravenous Once  . hydroxyurea  1,000 mg Oral Daily  . ketorolac  30 mg Intravenous Once  . methylPREDNISolone (SOLU-MEDROL) injection  40 mg Intravenous Once  . ondansetron  4 mg Intravenous Once  . oxyCODONE  80 mg Oral Q12H  . sodium chloride  500 mL Intravenous Once   Continuous Infusions:   . 0.9 % NaCl with KCl 20 mEq / L 75 mL/hr at 04/16/11 0814   PRN Meds:.acetaminophen,  acetaminophen, albuterol, alum & mag hydroxide-simeth, diphenhydrAMINE, ipratropium, ketorolac, meperidine, ondansetron (ZOFRAN) IV, ondansetron, oxycodone, DISCONTD: diphenhydrAMINE  Assessment/Plan:  Principal Problem:  *Pneumonia: The evidence for this is rather tenuous, as patient has no cough, no phlegm production, and no pyrexia. Will continue Azithromycin/Rocephin (day# 1) for now, but suspect we shall soon be able to transition to Avelox. Active Problems:  1. Sickle cell  anemia with crisis: On ivi fluids. We shall increse this to 125 cc/hr, as patient appears clinically under-hydrated. Continue analgesics, Folate, and oxygen therapy. Hb appears reasonable, but patient was seen by Dr Myna Hidalgo on 04/13/2011, an the plan at that time, was to consider exchange transfusion, should a crisis supervene. Will consult Dr Myna Hidalgo, accordingly. 2. Pruritus: This appears to be a reaction to opioid analgesics, and has ameliorated considerably, with Demerol and antihistaminics, which we shall continue.  Comment: Patient is stable for transfer to Oncology floor.   LOS: 0 days   Esker Dever,CHRISTOPHER 04/16/2011, 8:50 AM

## 2011-04-16 NOTE — ED Provider Notes (Signed)
History     CSN: [id number]  Arrival date & time 04/16/11  0118   First MD Initiated Contact with Patient 04/16/11 0139      Chief Complaint  Patient presents with  . Sickle Cell Pain Crisis    (Consider location/radiation/quality/duration/timing/severity/associated sxs/prior treatment) Patient is a 31 y.o. male presenting with sickle cell pain. The history is provided by the patient. No language interpreter was used.  Sickle Cell Pain Crisis  This is a recurrent problem. The current episode started 3 to 5 days ago. The onset was sudden. The problem occurs continuously. The problem has been unchanged. Associated with: nothing. Pain location: back left shoulder. Site of pain is localized in bone. The pain is similar to prior episodes. The pain is severe. The symptoms are relieved by nothing. The symptoms are not relieved by one or more prescription drugs. Exacerbated by: nothing. Associated symptoms include back pain and cough. Pertinent negatives include no chest pain, no blurred vision, no double vision, no photophobia, no abdominal pain, no diarrhea, no vomiting, no headaches, no sore throat, no neck pain, no neck stiffness, no loss of sensation, no tingling, no weakness, no difficulty breathing, no rash and no eye pain. There is no swelling present. He has been behaving normally. He has been eating and drinking normally. Urine output has been normal. The last void occurred less than 6 hours ago. There have been no frequent pain crises. There is no history of stroke. He has been treated with hydroxyurea. There were no sick contacts.    Past Medical History  Diagnosis Date  . Sickle cell anemia     History reviewed. No pertinent past surgical history.  Family History  Problem Relation Age of Onset  . Sickle cell trait      History  Substance Use Topics  . Smoking status: Never Smoker   . Smokeless tobacco: Not on file  . Alcohol Use: Yes      Review of Systems    Constitutional: Negative for activity change.  HENT: Negative for sore throat and neck pain.   Eyes: Negative for blurred vision, double vision, photophobia and pain.  Respiratory: Positive for cough. Negative for shortness of breath.   Cardiovascular: Negative for chest pain.  Gastrointestinal: Negative for vomiting, abdominal pain, diarrhea and abdominal distention.  Genitourinary: Negative for difficulty urinating.  Musculoskeletal: Positive for back pain.  Skin: Negative for rash.  Neurological: Negative for tingling, weakness and headaches.  Hematological: Negative.   Psychiatric/Behavioral: Negative.     Allergies  Morphine and related and Phenergan  Home Medications  No current outpatient prescriptions on file.  BP 116/71  Pulse 94  Temp(Src) 98.7 F (37.1 C) (Oral)  Resp 17  SpO2 85%  Physical Exam  Constitutional: He is oriented to person, place, and time. He appears well-developed and well-nourished.  HENT:  Head: Normocephalic and atraumatic.  Mouth/Throat: Oropharynx is clear and moist.  Eyes: EOM are normal. Pupils are equal, round, and reactive to light.  Neck: Normal range of motion. Neck supple.  Cardiovascular: Normal rate and regular rhythm.   Pulmonary/Chest: Effort normal and breath sounds normal. He has no wheezes.  Abdominal: Soft. Bowel sounds are normal.  Musculoskeletal: Normal range of motion. He exhibits no edema.  Lymphadenopathy:    He has no cervical adenopathy.  Neurological: He is alert and oriented to person, place, and time.  Skin: Skin is warm and dry. He is not diaphoretic.  Psychiatric: Thought content normal.    ED Course  Procedures (including critical care time)  Labs Reviewed  CBC - Abnormal; Notable for the following:    WBC 12.9 (*)    RBC 3.51 (*)    Hemoglobin 11.2 (*)    HCT 30.1 (*)    MCHC 37.2 (*) SICKLE CELLS   RDW 24.3 (*)    All other components within normal limits  DIFFERENTIAL - Abnormal; Notable for the  following:    Neutro Abs 9.4 (*)    Monocytes Absolute 1.4 (*)    All other components within normal limits  RETICULOCYTES - Abnormal; Notable for the following:    Retic Ct Pct 27.0 (*) RESULTS CONFIRMED BY MANUAL DILUTION   RBC. 3.51 (*)    Retic Count, Manual 947.7 (*)    All other components within normal limits  POCT I-STAT, CHEM 8 - Abnormal; Notable for the following:    Glucose, Bld 122 (*)    Hemoglobin 11.6 (*)    HCT 34.0 (*)    All other components within normal limits  I-STAT, CHEM 8  CULTURE, BLOOD (ROUTINE X 2)  CULTURE, BLOOD (ROUTINE X 2)  BASIC METABOLIC PANEL  CBC  CULTURE, RESPIRATORY  MRSA PCR SCREENING   Dg Chest 2 View  04/16/2011  *RADIOLOGY REPORT*  Clinical Data: Cough.  Short of breath.  CHEST - 2 VIEW  Comparison: 11/13/2010.  Findings: Low volume chest.  There is diffuse interstitial prominence, greater than expected for the volumes of inspiration. Findings suggestive of atypical/mycoplasma pneumonia. Cholecystectomy clips are present in the right upper quadrant. Sickle cell changes are noted in the thoracolumbar spine.  The cardiopericardial silhouette is probably within normal limits allowing for volumes of inspiration.  IMPRESSION:  1.  Diffuse interstitial prominence suggesting atypical pneumonia/mycoplasma. 2.  Low volume chest accentuating the cardiopericardial silhouette. 3.  Sickle cell changes of the thoracic spine.  Original Report Authenticated By: Andreas Newport, M.D.     1. Sickle cell anemia with crisis   2. Pneumonia   3. Hb-SS disease without crisis       MDM  Hallucinates post medication        Sherika Kubicki K Carman Essick-Rasch, MD 04/16/11 [phone]

## 2011-04-16 NOTE — H&P (Addendum)
PCP:  Dr. Ronne Binning [facility] Dr Selena Batten  Chief Complaint:  Pain  HPI: This is a 31 year old male with known history of sickle cell disease.he has been having pain since Wednesday but has been manageable at home narcotics. The patient got some more severe today and he decided to come to ER. The pain is in his back, his neck and shoulder. In the ER the patient received Dilaudid. He has developed severe pruritis and lots of scratching. His nose is almost denuded from the rubbing. His girlfriend is at his bedside. She states he's always like this when on narcotics. She states the home by mouth narcotics does this to lesser degree, it is alleviated with Benadryl. On the surface patient does not seem to aware of his surroundings with the severity of itching. But when spoken to, he opens his eyes, focuses and answers appropriately. He states this happens with all narcotics and is nothing new. History provided by patient and girlfriend.   Review of Systems: Positives bolded  The patient denies anorexia, fever, weight loss,, vision loss, decreased hearing, hoarseness, chest pain, syncope, dyspnea on exertion, peripheral edema, balance deficits, hemoptysis, abdominal pain, melena, hematochezia, severe indigestion/heartburn, hematuria, incontinence, genital sores, muscle weakness, suspicious skin lesions, transient blindness, difficulty walking, depression, unusual weight change, abnormal bleeding, enlarged lymph nodes, angioedema, and breast masses.  Past Medical History: Past Medical History  Diagnosis Date  . Sickle cell anemia    History reviewed. No pertinent past surgical history.  Medications: Prior to Admission medications   Medication Sig Start Date End Date Taking? Authorizing Provider  folic acid (FOLVITE) 1 MG tablet Take 1 mg by mouth daily.     Yes Historical Provider, MD  hydroxyurea (HYDREA) 500 MG capsule Take 1,000 mg by mouth daily. May take with food to minimize GI side effects.    Yes Historical Provider, MD  oxyCODONE (OXYCONTIN) 80 MG 12 hr tablet Take 1 tablet (80 mg total) by mouth every 12 (twelve) hours. 03/23/11  Yes Josph Macho, MD  oxycodone (ROXICODONE) 30 MG immediate release tablet 1 - 2 tabs every 6 hours prn sickle cell crisis pain 03/23/11  Yes Josph Macho, MD    Allergies:   Allergies  Allergen Reactions  . Morphine And Related Rash  . Phenergan     Social History:  reports that he has never smoked. He does not have any smokeless tobacco history on file. He reports that he drinks alcohol. He reports that he does not use illicit drugs.  Family History: Family History  Problem Relation Age of Onset  . Sickle cell trait      Physical Exam: Filed Vitals:   04/16/11 0124 04/16/11 0132  BP: 118/76   Pulse: 85 79  Temp: 98.9 F (37.2 C)   TempSrc: Oral   Resp: 18   SpO2: 92% 96%    General:  Alert and oriented times three but writhing from pruritis often almost unaware of surrounding, well developed and nourished, Eyes: PERRLA, pink conjunctiva, no scleral icterus ENT: Moist oral mucosa, neck supple, no thyromegaly, nose becoming denuded from rubbing Lungs: clear to ascultation, no wheeze, no crackles, no use of accessory muscles Cardiovascular: regular rate and rhythm, no regurgitation, no gallops, no murmurs. No carotid bruits, no JVD Abdomen: soft, positive BS, non-tender, non-distended, no organomegaly, not an acute abdomen GU: not examined Neuro: CN II - XII grossly intact, sensation intact Musculoskeletal: strength 5/5 all extremities, no clubbing, cyanosis or edema Skin: no rash, no subcutaneous crepitation,  no decubitus    Labs on Admission:   [facility] 04/16/11 0248 04/13/11 1207  NA 139 137  K 3.6 4.4  CL 104 108  CO2 -- 19  GLUCOSE 122* 83  BUN 14 13  CREATININE 1.10 1.01  CALCIUM -- 8.9  MG -- --  PHOS -- --    Basename 04/13/11 1207  AST 72*  ALT 27  ALKPHOS 83  BILITOT 3.2*  PROT 6.3  ALBUMIN  3.9   No results found for this basename: LIPASE:2,AMYLASE:2 in the last 72 hours  Basename 04/16/11 0248 04/16/11 0200 04/13/11 1207  WBC -- 12.9* 8.8  NEUTROABS -- 9.4* --  HGB 11.6* 11.2* --  HCT 34.0* 30.1* --  MCV -- 85.8 88  PLT -- 271 271 Large platelets present   No results found for this basename: CKTOTAL:3,CKMB:3,CKMBINDEX:3,TROPONINI:3 in the last 72 hours No components found with this basename: POCBNP:3 No results found for this basename: DDIMER:2 in the last 72 hours No results found for this basename: HGBA1C:2 in the last 72 hours No results found for this basename: CHOL:2,HDL:2,LDLCALC:2,TRIG:2,CHOLHDL:2,LDLDIRECT:2 in the last 72 hours No results found for this basename: TSH,T4TOTAL,FREET3,T3FREE,THYROIDAB in the last 72 hours  Basename 04/16/11 0200 04/13/11 1207  VITAMINB12 -- --  FOLATE -- --  FERRITIN -- [id number]  TIBC -- --  IRON -- --  RETICCTPCT 27.0* 18.3*18.3*18.3*    Micro Results: No results found for this or any previous visit (from the past 240 hour(s)).   Radiological Exams on Admission: Dg Chest 2 View  04/16/2011  *RADIOLOGY REPORT*  Clinical Data: Cough.  Short of breath.  CHEST - 2 VIEW  Comparison: 11/13/2010.  Findings: Low volume chest.  There is diffuse interstitial prominence, greater than expected for the volumes of inspiration. Findings suggestive of atypical/mycoplasma pneumonia. Cholecystectomy clips are present in the right upper quadrant. Sickle cell changes are noted in the thoracolumbar spine.  The cardiopericardial silhouette is probably within normal limits allowing for volumes of inspiration.  IMPRESSION:  1.  Diffuse interstitial prominence suggesting atypical pneumonia/mycoplasma. 2.  Low volume chest accentuating the cardiopericardial silhouette. 3.  Sickle cell changes of the thoracic spine.  Original Report Authenticated By: Andreas Newport, M.D.    Assessment/Plan Present on Admission:  .Sickle cell anemia with  crisis .Pneumonia Severe pruritis Admit to step down, patient is being admitted to step down for the severe pruritus and almost euphoria that patient is experiencing. He states that he gets his oral narcotics, including Demerol Dilaudid and fentanyl. IV fluids and pain medications as needed  Benadryl IV every 4 hours when necessary pruritis Solu-Medrol 40 mg IV x1 for pruritis IV antibiotics ordered Blood cultures and sputum cultures   I have reviewed patient's old discharge summaries and cannot find mention of such severe pruritis to narcotics. He is often on a PCA pump. I have seen where he had to received narcan once and there was once a mention of problems with narcotic, however this was not better quantified. I will change narcotics to Demerol and add Toradol to see this helps.  restraint mittens ordered. vest not ordered as this would not be safe with patient's writhing. If persists may benefit from one-to-one.   Full code  DVT prophylaxis Team 4/Dr. Derry Lory, Captain Blucher 04/16/2011, 4:40 AM

## 2011-04-16 NOTE — ED Notes (Signed)
Pt report left should pain , back and right hip pain since yesterday am.

## 2011-04-16 NOTE — ED Notes (Signed)
Pt thrashing with medicine.  MD at bedside and noted reaction to meds.

## 2011-04-16 NOTE — ED Notes (Signed)
Pt itching.  Notified md for benadryl dose.

## 2011-04-16 NOTE — ED Notes (Signed)
Pt Continues to thrash with itching. Medicated per MD orders.

## 2011-04-17 ENCOUNTER — Inpatient Hospital Stay (HOSPITAL_COMMUNITY): Payer: Medicare Other

## 2011-04-17 DIAGNOSIS — D5700 Hb-SS disease with crisis, unspecified: Principal | ICD-10-CM

## 2011-04-17 LAB — TYPE AND SCREEN
ABO/RH(D): O POS
Antibody Screen: NEGATIVE
Unit division: 0
Unit division: 0

## 2011-04-17 LAB — COMPREHENSIVE METABOLIC PANEL
Albumin: 3.9 g/dL (ref 3.5–5.2)
Alkaline Phosphatase: 83 U/L (ref 39–117)
BUN: 13 mg/dL (ref 6–23)
CO2: 19 mEq/L (ref 19–32)
Glucose, Bld: 83 mg/dL (ref 70–99)
Potassium: 4.4 mEq/L (ref 3.5–5.3)
Total Bilirubin: 3.2 mg/dL — ABNORMAL HIGH (ref 0.3–1.2)
Total Protein: 6.3 g/dL (ref 6.0–8.3)

## 2011-04-17 LAB — BASIC METABOLIC PANEL
CO2: 21 mEq/L (ref 19–32)
Chloride: 109 mEq/L (ref 96–112)
Chloride: 108 mEq/L (ref 96–112)
Creatinine, Ser: 1.03 mg/dL (ref 0.50–1.35)
GFR calc Af Amer: >90 mL/min (ref >90)
Potassium: 4.3 mEq/L (ref 3.5–5.1)
Potassium: 4.4 mEq/L (ref 3.5–5.1)

## 2011-04-17 LAB — HEMOGLOBINOPATHY EVALUATION
Hemoglobin Other: 0 %
Hgb A2 Quant: 3.1 % (ref 2.2–3.2)
Hgb A: 0 % — ABNORMAL LOW (ref 96.8–97.8)

## 2011-04-17 LAB — LACTATE DEHYDROGENASE: LDH: 379 U/L — ABNORMAL HIGH (ref 94–250)

## 2011-04-17 LAB — RETICULOCYTES (CHCC)
ABS Retic: 611.2 10*3/uL — ABNORMAL HIGH (ref 19.0–186.0)
RBC.: 3.34 MIL/uL — ABNORMAL LOW (ref 4.22–5.81)

## 2011-04-17 LAB — CBC
HCT: 24.4 % — ABNORMAL LOW (ref 39.0–52.0)
HCT: 24 % — ABNORMAL LOW (ref 39.0–52.0)
Hemoglobin: 9.2 g/dL — ABNORMAL LOW (ref 13.0–17.0)
MCV: 83.9 fL (ref 78.0–100.0)
Platelets: 272 10*3/uL (ref 150–400)
RBC: 2.86 MIL/uL — ABNORMAL LOW (ref 4.22–5.81)
RDW: 23.3 % — ABNORMAL HIGH (ref 11.5–15.5)
WBC: 14.5 10*3/uL — ABNORMAL HIGH (ref 4.0–10.5)
WBC: 14.6 10*3/uL — ABNORMAL HIGH (ref 4.0–10.5)

## 2011-04-17 MED ORDER — ACETAMINOPHEN 325 MG PO TABS
650.0000 mg | ORAL_TABLET | Freq: Once | ORAL | Status: AC
  Administered 2011-04-17: 650 mg via ORAL

## 2011-04-17 MED ORDER — METHYLPREDNISOLONE SODIUM SUCC 125 MG IJ SOLR
60.0000 mg | Freq: Once | INTRAMUSCULAR | Status: AC
  Administered 2011-04-17: 60 mg via INTRAVENOUS
  Filled 2011-04-17: qty 0.96

## 2011-04-17 MED ORDER — SODIUM CHLORIDE 0.9 % IJ SOLN
10.0000 mL | INTRAMUSCULAR | Status: DC | PRN
  Administered 2011-04-17 (×2): 20 mL
  Administered 2011-04-18 – 2011-04-19 (×13): 10 mL

## 2011-04-17 MED ORDER — NALOXONE HCL 1 MG/ML IJ SOLN
2.0000 mg | INTRAMUSCULAR | Status: DC | PRN
  Filled 2011-04-17: qty 2

## 2011-04-17 MED ORDER — METHYLPREDNISOLONE SODIUM SUCC 125 MG IJ SOLR
60.0000 mg | Freq: Once | INTRAMUSCULAR | Status: AC
  Administered 2011-04-18: 60 mg via INTRAVENOUS
  Filled 2011-04-17: qty 0.96

## 2011-04-17 MED ORDER — DIPHENHYDRAMINE HCL 50 MG/ML IJ SOLN
25.0000 mg | INTRAMUSCULAR | Status: DC | PRN
  Administered 2011-04-17 – 2011-04-19 (×7): 25 mg via INTRAVENOUS
  Filled 2011-04-17 (×3): qty 1
  Filled 2011-04-17: qty 2

## 2011-04-17 MED ORDER — LORAZEPAM 2 MG/ML IJ SOLN
1.0000 mg | Freq: Once | INTRAMUSCULAR | Status: AC
  Administered 2011-04-17: 1 mg via INTRAMUSCULAR

## 2011-04-17 MED ORDER — HYDROMORPHONE HCL PF 2 MG/ML IJ SOLN
4.0000 mg | INTRAMUSCULAR | Status: DC | PRN
  Administered 2011-04-17 (×2): 4 mg via INTRAVENOUS
  Filled 2011-04-17 (×2): qty 2

## 2011-04-17 MED ORDER — NALOXONE HCL 0.4 MG/ML IJ SOLN
INTRAMUSCULAR | Status: AC
  Administered 2011-04-17: 2 mg
  Filled 2011-04-17: qty 5

## 2011-04-17 MED ORDER — KETOROLAC TROMETHAMINE 30 MG/ML IJ SOLN
INTRAMUSCULAR | Status: AC
  Administered 2011-04-17: 30 mg via INTRAVENOUS
  Filled 2011-04-17: qty 1

## 2011-04-17 MED ORDER — MEPERIDINE HCL 50 MG/ML IJ SOLN: 50.0000 mg | INTRAMUSCULAR | Status: DC | PRN

## 2011-04-17 MED ORDER — HYDROXYZINE HCL 25 MG PO TABS
25.0000 mg | ORAL_TABLET | ORAL | Status: DC | PRN
  Filled 2011-04-17 (×2): qty 1

## 2011-04-17 MED ORDER — FUROSEMIDE 10 MG/ML IJ SOLN
20.0000 mg | Freq: Once | INTRAMUSCULAR | Status: AC
  Administered 2011-04-18: 20 mg via INTRAVENOUS
  Filled 2011-04-17: qty 2

## 2011-04-17 NOTE — Consult Note (Signed)
This office note has been dictated.

## 2011-04-17 NOTE — Progress Notes (Addendum)
Subjective: Feels better overnight.  Objective: Vital signs in last 24 hours: Temp:  [97.7 F (36.5 C)-98.8 F (37.1 C)] 97.9 F (36.6 C) (01/08 0631) Pulse Rate:  [76-98] 96  (01/08 0631) Resp:  [16-26] 18  (01/08 0631) BP: (111-124)/(58-67) 111/62 mmHg (01/08 0631) SpO2:  [93 %-99 %] 96 % (01/08 0631) Weight change:     Intake/Output from previous day: 01/07 0701 - 01/08 0700 In: 2385 [P.O.:360; I.V.:2025] Out: 1275 [Urine:1275]     Physical Exam: General: Comfortable, alert, communicative, fully oriented, not short of breath at rest. Hydration status is satisfactory.  HEENT:  Mild clinical pallor, no jaundice, no conjunctival injection or discharge. NECK:  Supple, JVP not seen, no carotid bruits, no palpable lymphadenopathy, no palpable goiter. CHEST:  Clinically clear to auscultation, no wheezes, no crackles. HEART:  Sounds 1 and 2 heard, normal, regular, no murmurs. ABDOMEN:  Full, soft, non-tender, no palpable organomegaly, no palpable masses, normal bowel sounds. GENITALIA:  Not examined. LOWER EXTREMITIES:  No pitting edema, palpable peripheral pulses. MUSCULOSKELETAL SYSTEM:  Generalized osteoarthritic changes, otherwise, normal. CENTRAL NERVOUS SYSTEM:  No focal neurologic deficit on gross examination.  Lab Results:  Basename 04/17/11 0416 04/17/11 0031  WBC 14.6* 14.5*  HGB 9.1* 9.2*  HCT 24.0* 24.4*  PLT 272 260    Basename 04/17/11 0416 04/17/11 0031  NA 139 140  K 4.4 4.3  CL 108 109  CO2 21 22  GLUCOSE 100* 110*  BUN 16 17  CREATININE 1.03 0.99  CALCIUM 8.3* 8.4   Recent Results (from the past 240 hour(s))  CULTURE, BLOOD (ROUTINE X 2)     Status: Normal (Preliminary result)   Collection Time   04/16/11  4:15 AM      Component Value Range Status Comment   Specimen Description BLOOD RIGHT ANTECUBITAL   Final    Special Requests     Final    Value: BOTTLES DRAWN AEROBIC AND ANAEROBIC 4CC AEROBIC/2CC ANAEROBIC   Setup Time [id number]   Final      Culture     Final    Value:        BLOOD CULTURE RECEIVED NO GROWTH TO DATE CULTURE WILL BE HELD FOR 5 DAYS BEFORE ISSUING A FINAL NEGATIVE REPORT   Report Status PENDING   Incomplete   CULTURE, BLOOD (ROUTINE X 2)     Status: Normal (Preliminary result)   Collection Time   04/16/11  4:35 AM      Component Value Range Status Comment   Specimen Description BLOOD R FOREARM   Final    Special Requests     Final    Value: BOTTLES DRAWN AEROBIC AND ANAEROBIC 6CC AEROBIC/4CC ANAEROBIC   Setup Time [id number]   Final    Culture     Final    Value:        BLOOD CULTURE RECEIVED NO GROWTH TO DATE CULTURE WILL BE HELD FOR 5 DAYS BEFORE ISSUING A FINAL NEGATIVE REPORT   Report Status PENDING   Incomplete   MRSA PCR SCREENING     Status: Abnormal   Collection Time   04/16/11  7:15 AM      Component Value Range Status Comment   MRSA by PCR POSITIVE (*) NEGATIVE  Final      Studies/Results: Dg Chest 2 View  04/16/2011  *RADIOLOGY REPORT*  Clinical Data: Cough.  Short of breath.  CHEST - 2 VIEW  Comparison: 11/13/2010.  Findings: Low volume chest.  There is diffuse interstitial prominence,  greater than expected for the volumes of inspiration. Findings suggestive of atypical/mycoplasma pneumonia. Cholecystectomy clips are present in the right upper quadrant. Sickle cell changes are noted in the thoracolumbar spine.  The cardiopericardial silhouette is probably within normal limits allowing for volumes of inspiration.  IMPRESSION:  1.  Diffuse interstitial prominence suggesting atypical pneumonia/mycoplasma. 2.  Low volume chest accentuating the cardiopericardial silhouette. 3.  Sickle cell changes of the thoracic spine.  Original Report Authenticated By: Andreas Newport, M.D.    Medications: Scheduled Meds:    . acetaminophen  650 mg Oral Once  . azithromycin  500 mg Intravenous Q24H  . cefTRIAXone (ROCEPHIN)  IV  1 g Intravenous Q24H  . Chlorhexidine Gluconate Cloth  6 each Topical Q0600  .  enoxaparin  40 mg Subcutaneous Q24H  . folic acid  1 mg Oral Daily  . furosemide  20 mg Intravenous Once  . hydroxyurea  1,000 mg Oral Daily  . mupirocin ointment  1 application Nasal BID  . oxyCODONE  80 mg Oral Q12H   Continuous Infusions:    . 0.9 % NaCl with KCl 20 mEq / L 125 mL/hr at 04/17/11 0845   PRN Meds:.acetaminophen, acetaminophen, albuterol, alum & mag hydroxide-simeth, diphenhydrAMINE, HYDROmorphone (DILAUDID) injection, hydrOXYzine, ipratropium, ondansetron (ZOFRAN) IV, ondansetron, oxycodone, DISCONTD: diphenhydrAMINE, DISCONTD: ketorolac, DISCONTD: meperidine  Assessment/Plan:  Principal Problem:  *Pneumonia: The evidence for this is rather tenuous, as patient has no cough, no phlegm production, and no pyrexia. Will continue Azithromycin/Rocephin (day# 2) for now, but suspect we shall soon be able to transition to Avelox. Active Problems:  1. Sickle cell anemia with crisis: On ivi fluids, analgesics, Hydroxyurea, Folate, and oxygen therapy. Hb appears reasonable, but patient was seen by Dr Myna Hidalgo on 04/13/2011, and the plan at that time, was to consider exchange transfusion, should a crisis supervene. We have consulted Dr Myna Hidalgo, accordingly, and EBT will indeed, be done today. 2. Pruritus: This appears to be a reaction to opioid analgesics, and has ameliorated considerably, with Demerol and antihistaminics. All day on 04/16/11, patient requested change back to Dilaudid. This has been effected, without any deleterious effects.    LOS: 1 day   Salli Bodin,CHRISTOPHER 04/17/2011, 10:54 AM   Addendum: 7.26 PM. Called at about 6:55 PM, as patient was confused, somnolent, very itchy and restless, and Os sats had dropped to 50s on RA. Place on NRB, opioids reversed with Narcan 1.2 mg iv stat, with improvement is Sats to >95% on RA. Acute relapse of pain was addressed with iv Toradol 30 mg stat. Iv Solumedrol 60 mg Q8 hours x 2 doses. Benadryl iv continues. The plan is to reinstate  Demerol 50 mg prn Q 3 hours, as prior, for further pain control.  C. Oaklyn Mans. MD.

## 2011-04-17 NOTE — Progress Notes (Signed)
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 C/O generalized pain of shoulders, hips, and back. Pain meds layered including oxycontin 80 mg po, toradol iv, and demerol 50 mg iv. Continuous IVF infusing and IV abx. Explained that due to opiate intolerance and risk of allergic reaction, pain meds were staggered and carefully monitored.   Neuro: a/o x 4 CV: BP stable, SR IV: peripheral access with continuous fluids and po  GI: no n/v/c/d GU: voiding per urinal clear yellow WNL PMHX of sickle cell dx since infancy

## 2011-04-17 NOTE — Progress Notes (Signed)
Made aware of unfavorable vital signs by NT.  Patients oxygen sats were in the 50's, HR in the 130's.  Placed 10L O2 on NRB mask.  Sats increased to 90's, HR decreased to 110-130.  MD made aware, TO taken.  Will continue to monitor.

## 2011-04-17 NOTE — Progress Notes (Signed)
Chest xray for PICC showed tip PICC crossing over midline to left subclavian - patient positioned in Fowlers position and power flush  x2 done to each lumen - RN aware and to reorder stat PCXR for 1730.Marland Kitchen VSandritter RN/VABC.

## 2011-04-18 LAB — BASIC METABOLIC PANEL
CO2: 25 mEq/L (ref 19–32)
Chloride: 105 mEq/L (ref 96–112)
Creatinine, Ser: 0.83 mg/dL (ref 0.50–1.35)
GFR calc Af Amer: >90 mL/min (ref >90)
Potassium: 4.5 mEq/L (ref 3.5–5.1)

## 2011-04-18 LAB — CBC
HCT: 27.1 % — ABNORMAL LOW (ref 39.0–52.0)
Hemoglobin: 10.1 g/dL — ABNORMAL LOW (ref 13.0–17.0)
MCV: 80.9 fL (ref 78.0–100.0)
RBC: 3.35 MIL/uL — ABNORMAL LOW (ref 4.22–5.81)
WBC: 12 10*3/uL — ABNORMAL HIGH (ref 4.0–10.5)

## 2011-04-18 MED ORDER — FUROSEMIDE 10 MG/ML IJ SOLN
20.0000 mg | Freq: Once | INTRAMUSCULAR | Status: AC
  Administered 2011-04-18: 20 mg via INTRAVENOUS
  Filled 2011-04-18: qty 2

## 2011-04-18 MED ORDER — HYDROMORPHONE HCL PF 2 MG/ML IJ SOLN
4.0000 mg | INTRAMUSCULAR | Status: DC | PRN
  Administered 2011-04-18 (×2): 4 mg via INTRAVENOUS
  Filled 2011-04-18 (×2): qty 2

## 2011-04-18 MED ORDER — ACETAMINOPHEN 325 MG PO TABS
650.0000 mg | ORAL_TABLET | Freq: Once | ORAL | Status: AC
  Administered 2011-04-18: 650 mg via ORAL
  Filled 2011-04-18: qty 2

## 2011-04-18 MED ORDER — OXYCODONE HCL 5 MG PO TABS
15.0000 mg | ORAL_TABLET | ORAL | Status: DC | PRN
  Administered 2011-04-18: 10 mg via ORAL
  Administered 2011-04-19 – 2011-04-20 (×7): 15 mg via ORAL
  Filled 2011-04-18 (×5): qty 3
  Filled 2011-04-18: qty 1
  Filled 2011-04-18 (×2): qty 3
  Filled 2011-04-18: qty 2

## 2011-04-18 NOTE — Consult Note (Signed)
NAMEJAMARI, MOTEN NO.:  [id number]  MEDICAL RECORD NO.:  [id number]  LOCATION:  1522                         FACILITY:  [facility]  PHYSICIAN:  Josph Macho, M.D.  DATE OF BIRTH:  1981-01-03  DATE OF CONSULTATION:  04/17/2011 DATE OF DISCHARGE:                                CONSULTATION   REFERRING PHYSICIAN:  Isidor Holts, M.D.  REASON FOR CONSULTATION: 1. Sickle cell crisis. 2. Hemoglobin SS disease.  HISTORY OF PRESENT ILLNESS:  Mr. 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  He has actually done fairly well with this.  He is on Hydrea, he takes 500 mg 3 times a day.  I just saw him in the office last week.  He is doing well.  He became ill over the week and currently developed acute pain in his right hip and left shoulder.  He is on OxyContin at home along with oxycodone. This does not help at home.  He subsequently was admitted.  When I saw him in the office, his hemoglobin and hematocrit were 10.6 and 28.5.  When he was admitted, his hemoglobin was 11.2 and hematocrit 30.1.  He did have a chest x-ray on admission.  This showed diffuse interstitial prominence suggestive of atypical pneumonia/mycoplasma.  He had surgical changes of the thoracic spine.  He has been started on antibiotics.  He has not had any kind of fever.  He is getting Demerol.  This has caused a lot of pruritus.  He is also getting Toradol, which he says is not working.  He is not having diarrhea.  We were asked to see him and try to help manage the crisis.  PAST MEDICAL HISTORY:  Unremarkable outside of the sickle cell disease.  ALLERGIES: 1. MORPHINE. 2. DILAUDID. 3. PHENERGAN.  MEDICATIONS ON ADMISSION: 1. Hydrea 1000 mg p.o. daily. 2. Folic acid 1 mg p.o. daily. 3. OxyContin 80 mg p.o. q.12 hours. 4. Oxycodone 30 mg p.o. q.6 hours p.r.n.  SOCIAL HISTORY:  Negative for tobacco or alcohol use.  FAMILY HISTORY:   Remarkable for sickle cell in the family.  PHYSICAL EXAMINATION:  GENERAL:  This is a well-developed, well- nourished, African American gentleman in no obvious distress. VITAL SIGNS:  Temperature 97, pulse 70, respiratory rate 16, blood pressure 106/76. HEENT:  Head exam shows a normocephalic, atraumatic skull.  He has no ocular or oral lesions.  There are no palpable cervical, supraclavicular or clavicular lymph nodes. LUNGS:  Clear to percussion and auscultation bilaterally. CARDIAC:  Regular rate and rhythm with normal S1, S2.  No murmurs, rubs, or bruits. ABDOMINAL:  Soft with good bowel sounds.  There is no palpable abdominal mass.  There is no fluid wave.  There is no palpable hepatosplenomegaly. EXTREMITIES:  Show no clubbing, cyanosis, or edema.  He has tenderness in his right hip to movement and palpation.  There is some decreased range of motion of the left shoulder. NEUROLOGICAL:  Shows no focal neurological deficit.  PLAN:  For now, we will do exchange transfusions on Mr. Weightman.  This hopefully will help with the crisis.  He says he can take Dilaudid despite having  this "allergy."  He says that the Demerol makes him itch quite a bit.  He is to continue his folic acid.  This is very important for him.  I will also continue him on the Hydrea.  He is on Solu-Medrol.  He is also on Rocephin and Zithromax for the interstitial prominence in his lungs.  We will follow Mr. Wetmore along and help out any way that we can.     Josph Macho, M.D.     PRE/MEDQ  D:  04/18/2011  T:  04/18/2011  Job:  [id number]

## 2011-04-18 NOTE — Progress Notes (Signed)
[name] is doing a little bit better today.  He was started on Dilaudid yesterday.  Apparently there is notation that he is allergic to Dilaudid.  He is not allergic to Dilaudid.  This will be amended in his allergy list.  He got his PICC line in yesterday.  This got in late in the day.  He finished taking in blood today.  He got phlebotomized yesterday.  Will do another "mini" exchange transfusion on him today.  He has had no fever.  He has had no nausea or vomiting.  He has had no cough.  He is out of bed a little bit.  PHYSICAL EXAM:  This is a well-developed, well-nourished black gentleman in no obvious distress.  Vital signs:  98.1, pulse 81, respiratory rate 20, blood pressure 127/79, oxygen saturation 98% on room air. Head/neck:  No ocular or oral lesions.  Lungs:  Clear bilaterally. Cardiac:  Regular rate and rhythm with normal S1, S2.  There are no murmurs, rubs, or bruits.  Abdomen:  Soft with good bowel sounds. Extremities:  No clubbing, cyanosis, or edema.  He does have tenderness to palpation in the right hip area.  Laboratory studies are pending.  Again, will go with another exchange transfusion today.  Will try to take off about 700 cc of blood and give him back 2 units of blood.  We will keep dialing in on his sickle cell to try to help with his crises.  He will continue his oral medication with oxycodone and OxyContin.  Will get him back on Dilaudid as he has a lot of pruritus with the Demerol.    ______________________________ Josph Macho, M.D. PRE/MEDQ  D:  04/18/2011  T:  04/18/2011  Job:  [id number]

## 2011-04-18 NOTE — Progress Notes (Signed)
Phlebotomy per MD order, patient tolerated well, RN aware.

## 2011-04-18 NOTE — Progress Notes (Signed)
Patient ID: [name], male   DOB: 06-07-1980, 31 y.o.   MRN: [id number] Subjective: Patient seen. Feels better. Complain of mild right hip pain. Currently receiving exchange blood transfusion.  Objective: Vital signs in last 24 hours: Temp:  [91.4 F (33 C)-99.6 F (37.6 C)] 98.2 F (36.8 C) (01/09 1842) Pulse Rate:  [70-118] 88  (01/09 1842) Resp:  [17-20] 18  (01/09 1842) BP: (106-143)/(60-79) 143/76 mmHg (01/09 1842) SpO2:  [93 %-98 %] 97 % (01/09 1405) Weight change:     Intake/Output from previous day: 01/08 0701 - 01/09 0700 In: 1080 [P.O.:480; I.V.:250; Blood:350] Out: 3275 [Urine:2575]     Physical Exam: General: Comfortable, alert, communicative, fully oriented, not short of breath at rest. Multiple abrasions on the face.  HEENT:   pallor, no jaundice, no conjunctival injection or discharge. NECK:  Supple, JVP not seen, no carotid bruits, no palpable lymphadenopathy, no palpable goiter. CHEST:  Clinically clear to auscultation, no wheezes, no crackles. HEART:  Sounds 1 and 2 heard, normal, regular, no murmurs. ABDOMEN:  Full, soft, non-tender, no palpable organomegaly, no palpable masses, normal bowel sounds. GENITALIA:  Not examined. LOWER EXTREMITIES:  No pitting edema, palpable peripheral pulses. MUSCULOSKELETAL SYSTEM-slight tenderness right hip CENTRAL NERVOUS SYSTEM:  No focal neurologic deficit on gross examination.  Lab Results:  [facility] 04/18/11 0910 04/17/11 0416  WBC 12.0* 14.6*  HGB 10.1* 9.1*  HCT 27.1* 24.0*  PLT 274 272    Basename 04/18/11 0910 04/17/11 0416  NA 137 139  K 4.5 4.4  CL 105 108  CO2 25 21  GLUCOSE 168* 100*  BUN 16 16  CREATININE 0.83 1.03  CALCIUM 8.7 8.3*   Recent Results (from the past 240 hour(s))  CULTURE, BLOOD (ROUTINE X 2)     Status: Normal (Preliminary result)   Collection Time   04/16/11  4:15 AM      Component Value Range Status Comment   Specimen Description BLOOD RIGHT ANTECUBITAL   Final    Special  Requests     Final    Value: BOTTLES DRAWN AEROBIC AND ANAEROBIC 4CC AEROBIC/2CC ANAEROBIC   Setup Time [id number]   Final    Culture     Final    Value:        BLOOD CULTURE RECEIVED NO GROWTH TO DATE CULTURE WILL BE HELD FOR 5 DAYS BEFORE ISSUING A FINAL NEGATIVE REPORT   Report Status PENDING   Incomplete   CULTURE, BLOOD (ROUTINE X 2)     Status: Normal (Preliminary result)   Collection Time   04/16/11  4:35 AM      Component Value Range Status Comment   Specimen Description BLOOD R FOREARM   Final    Special Requests     Final    Value: BOTTLES DRAWN AEROBIC AND ANAEROBIC 6CC AEROBIC/4CC ANAEROBIC   Setup Time [id number]   Final    Culture     Final    Value:        BLOOD CULTURE RECEIVED NO GROWTH TO DATE CULTURE WILL BE HELD FOR 5 DAYS BEFORE ISSUING A FINAL NEGATIVE REPORT   Report Status PENDING   Incomplete   MRSA PCR SCREENING     Status: Abnormal   Collection Time   04/16/11  7:15 AM      Component Value Range Status Comment   MRSA by PCR POSITIVE (*) NEGATIVE  Final      Studies/Results: Dg Chest Port 1 View  04/17/2011  *RADIOLOGY REPORT*  Clinical Data: PICC line placement  PORTABLE CHEST - 1 VIEW  Comparison: Chest radiograph 04/17/2011  Findings: Interval repositioning of the right PICC line such that the tip now lies in the distal SVC.  Stable cardiac silhouette. Central venous congestion is unchanged.  IMPRESSION: Repositioning of right PICC line which is now in appropriate position.  Original Report Authenticated By: Genevive Bi, M.D.   Chest Portable 1 View Post Insertion To Confirm Placement As Interpreted By Radiologist  04/17/2011  *RADIOLOGY REPORT*  Clinical Data: Line placement  PORTABLE CHEST - 1 VIEW  Comparison: 04/16/2011  Findings: Right subclavian PICC crosses midline into the left brachiocephalic vein and terminates in the left subclavian vein.  Low lung volumes with secondary vascular crowding. No pleural effusion or pneumothorax.  Cardiomediastinal  silhouette is within normal limits.  Cholecystectomy clips.  IMPRESSION: Right subclavian PICC crosses midline and terminates in the left subclavian vein.  Original Report Authenticated By: Charline Bills, M.D.    Medications: Scheduled Meds:    . acetaminophen  650 mg Oral Once  . azithromycin  500 mg Intravenous Q24H  . cefTRIAXone (ROCEPHIN)  IV  1 g Intravenous Q24H  . Chlorhexidine Gluconate Cloth  6 each Topical Q0600  . enoxaparin  40 mg Subcutaneous Q24H  . folic acid  1 mg Oral Daily  . furosemide  20 mg Intravenous Once  . furosemide  20 mg Intravenous Once  . hydroxyurea  1,000 mg Oral Daily  . methylPREDNISolone (SOLU-MEDROL) injection  60 mg Intravenous Once  . methylPREDNISolone (SOLU-MEDROL) injection  60 mg Intravenous Once  . mupirocin ointment  1 application Nasal BID  . oxyCODONE  80 mg Oral Q12H   Continuous Infusions:    . 0.9 % NaCl with KCl 20 mEq / L 125 mL (04/18/11 0428)   PRN Meds:.acetaminophen, acetaminophen, albuterol, alum & mag hydroxide-simeth, diphenhydrAMINE, hydrOXYzine, ipratropium, naloxone (NARCAN) injection, ondansetron (ZOFRAN) IV, ondansetron, oxycodone, sodium chloride, DISCONTD:  HYDROmorphone (DILAUDID) injection, DISCONTD: meperidine, DISCONTD: oxycodone  Assessment/Plan:  Principal Problem:  #1 Pneumonia-with continued IV Rocephin as well as Zithromax.  #2 . Sickle cell anemia with crisis:, analgesics, Hydroxyurea, Folate, and receiving exchange blood transfusion #3 . Pruritus: This appears to be a reaction to opioid analgesics. Resolving. We continue Atarax.    LOS: 2 days   Jarrah Babich 04/18/2011, 8:04 PM

## 2011-04-18 NOTE — Progress Notes (Signed)
700cc blood removed per MD order for therapeutic phlebotomy/previtals 120/58 69/post vitals 117/56 67/pt tolerated well/floor rn aware

## 2011-04-19 ENCOUNTER — Inpatient Hospital Stay (HOSPITAL_COMMUNITY): Payer: Medicare Other

## 2011-04-19 LAB — CBC
MCHC: 35.9 g/dL (ref 30.0–36.0)
Platelets: 237 10*3/uL (ref 150–400)
RDW: 20.2 % — ABNORMAL HIGH (ref 11.5–15.5)

## 2011-04-19 LAB — COMPREHENSIVE METABOLIC PANEL
AST: 45 U/L — ABNORMAL HIGH (ref 0–37)
CO2: 27 mEq/L (ref 19–32)
Calcium: 8.4 mg/dL (ref 8.4–10.5)
Creatinine, Ser: 0.85 mg/dL (ref 0.50–1.35)
GFR calc Af Amer: >90 mL/min (ref >90)
GFR calc non Af Amer: >90 mL/min (ref >90)

## 2011-04-19 LAB — RETICULOCYTES
RBC.: 3.37 MIL/uL — ABNORMAL LOW (ref 4.22–5.81)
Retic Count, Absolute: 626.8 10*3/uL — ABNORMAL HIGH (ref 19.0–186.0)

## 2011-04-19 LAB — DIFFERENTIAL
Basophils Absolute: 0 10*3/uL (ref 0.0–0.1)
Eosinophils Absolute: 0.2 10*3/uL (ref 0.0–0.7)
Lymphocytes Relative: 24 % (ref 12–46)
Neutro Abs: 9.6 10*3/uL — ABNORMAL HIGH (ref 1.7–7.7)
Neutrophils Relative %: 61 % (ref 43–77)

## 2011-04-19 MED ORDER — ACETAMINOPHEN 325 MG PO TABS
650.0000 mg | ORAL_TABLET | Freq: Once | ORAL | Status: AC
  Administered 2011-04-19: 650 mg via ORAL
  Filled 2011-04-19: qty 2

## 2011-04-19 MED ORDER — FUROSEMIDE 10 MG/ML IJ SOLN
20.0000 mg | Freq: Once | INTRAMUSCULAR | Status: AC
  Administered 2011-04-19: 20 mg via INTRAVENOUS
  Filled 2011-04-19: qty 2

## 2011-04-19 NOTE — Progress Notes (Signed)
Mr. Stavros is improving.  He is doing well with the exchange transfusions.  He did have some itching with the Dilaudid.  As such, this has been discontinued.  He has had no problems with cough.  He did have a repeat chest x-ray today.  The final report is not yet back.  He is on Zithromax and Rocephin.  On my review, his chest x-ray looks relatively clear.  He is still having some issues with right hip discomfort.  He is out of bed more.  His appetite is doing well.  PHYSICAL EXAM:  This is a well-developed, well-nourished, black gentleman in no obvious distress.  Vital signs:  98.2, pulse 90, respiratory rate 18, blood pressure 112/75, oxygen saturation on room air was 96%.  Head and neck:  Normocephalic, atraumatic skull.  He has no ocular or oral lesions.  He still has the excoriation of skin on his nose.  Chest:  His lungs are clear bilaterally.  There are no rales, wheezes, or rhonchi.  Cardiac:  Regular rate and rhythm with a normal S1, S2.  There are no murmurs, rubs, or bruits.  Abdomen:  Soft with good bowel sounds.  There is no fluid wave.  There is no palpable hepatosplenomegaly.  Extremities:  Good range of motion of his joints. He has good strength in his extremities.  This still may be some slight tenderness to palpation in his right hip area.  Neurological:  No focal neurological deficits.  LABORATORY STUDIES:  White count 15.8, hemoglobin 10.2, hematocrit 28.4, platelet count is 237.  Sodium 139, potassium 4.6.  BUN 11, creatinine 0.85.  Total bilirubin is 1.3.  I will go ahead and plan for 1 more exchange transfusion on him.  I believe this will be enough to dilute out his sickle cell.  Hopefully, he will be ready to go home in 1-2 more days.  His most recent ferritin is only 192, so iron overload really is not an issue for him.    ______________________________ Josph Macho, M.D. PRE/MEDQ  D:  04/19/2011  T:  04/19/2011  Job:  [id number]

## 2011-04-19 NOTE — Progress Notes (Signed)
Patient ID: [name], male   DOB: 1980/12/31, 31 y.o.   MRN: [id number] Patient ID: [name], male   DOB: [birth year], 31 y.o.   MRN: [id number] Subjective: Patient seen. Feels better. Mild right hip pain. Completed EBT yesterday. Follow up CXR for pneumonia was unremarkable  Objective: Vital signs in last 24 hours: Temp:  [97.7 F (36.5 C)-98.9 F (37.2 C)] 98.1 F (36.7 C) (01/10 1606) Pulse Rate:  [61-90] 74  (01/10 1606) Resp:  [18-20] 18  (01/10 1606) BP: (116-146)/(61-90) 118/74 mmHg (01/10 1606) SpO2:  [92 %-99 %] 99 % (01/10 0849) Weight change:     Intake/Output from previous day: 01/09 0701 - 01/10 0700 In: 1496 [P.O.:720; I.V.:250; Blood:526] Out: 4850 [Urine:4150] Total I/O In: 1911 [P.O.:240; I.V.:500; Blood:1171] Out: -    Physical Exam: General: Comfortable, alert, communicative, fully oriented, not short of breath at rest. Multiple abrasions on the face.  HEENT:   pallor, no jaundice, no conjunctival injection or discharge. NECK:  Supple, JVP not seen, no carotid bruits, no palpable lymphadenopathy, no palpable goiter. CHEST:  Clinically clear to auscultation, no wheezes, no crackles. HEART:  Sounds 1 and 2 heard, normal, regular, no murmurs. ABDOMEN:  Full, soft, non-tender, no palpable organomegaly, no palpable masses, normal bowel sounds. GENITALIA:  Not examined. LOWER EXTREMITIES:  No pitting edema, palpable peripheral pulses. MUSCULOSKELETAL SYSTEM-slight tenderness right hip CENTRAL NERVOUS SYSTEM:  No focal neurologic deficit on gross examination.  Lab Results:  [facility] 04/19/11 0435 04/18/11 0910  WBC 15.8* 12.0*  HGB 10.2* 10.1*  HCT 28.4* 27.1*  PLT 237 274    Basename 04/19/11 0435 04/18/11 0910  NA 139 137  K 4.6 4.5  CL 107 105  CO2 27 25  GLUCOSE 112* 168*  BUN 11 16  CREATININE 0.85 0.83  CALCIUM 8.4 8.7   Recent Results (from the past 240 hour(s))  CULTURE, BLOOD (ROUTINE X 2)     Status: Normal (Preliminary result)     Collection Time   04/16/11  4:15 AM      Component Value Range Status Comment   Specimen Description BLOOD RIGHT ANTECUBITAL   Final    Special Requests     Final    Value: BOTTLES DRAWN AEROBIC AND ANAEROBIC 4CC AEROBIC/2CC ANAEROBIC   Setup Time [id number]   Final    Culture     Final    Value:        BLOOD CULTURE RECEIVED NO GROWTH TO DATE CULTURE WILL BE HELD FOR 5 DAYS BEFORE ISSUING A FINAL NEGATIVE REPORT   Report Status PENDING   Incomplete   CULTURE, BLOOD (ROUTINE X 2)     Status: Normal (Preliminary result)   Collection Time   04/16/11  4:35 AM      Component Value Range Status Comment   Specimen Description BLOOD R FOREARM   Final    Special Requests     Final    Value: BOTTLES DRAWN AEROBIC AND ANAEROBIC 6CC AEROBIC/4CC ANAEROBIC   Setup Time [id number]   Final    Culture     Final    Value:        BLOOD CULTURE RECEIVED NO GROWTH TO DATE CULTURE WILL BE HELD FOR 5 DAYS BEFORE ISSUING A FINAL NEGATIVE REPORT   Report Status PENDING   Incomplete   MRSA PCR SCREENING     Status: Abnormal   Collection Time   04/16/11  7:15 AM      Component Value Range Status  Comment   MRSA by PCR POSITIVE (*) NEGATIVE  Final      Studies/Results: Dg Chest Port 1 View  04/19/2011  *RADIOLOGY REPORT*  Clinical Data: Pneumonia.  PORTABLE CHEST - 1 VIEW  Comparison: 04/17/2011  Findings: Right PICC line remains in place, unchanged.  There is cardiomegaly.  No focal airspace opacities or effusions. Increase in lung volume since prior study.  No overt edema.  No acute bony abnormality.  IMPRESSION: No acute disease.  Improving lung volumes.  Original Report Authenticated By: Cyndie Chime, M.D.   Dg Chest Port 1 View  04/17/2011  *RADIOLOGY REPORT*  Clinical Data: PICC line placement  PORTABLE CHEST - 1 VIEW  Comparison: Chest radiograph 04/17/2011  Findings: Interval repositioning of the right PICC line such that the tip now lies in the distal SVC.  Stable cardiac silhouette. Central venous  congestion is unchanged.  IMPRESSION: Repositioning of right PICC line which is now in appropriate position.  Original Report Authenticated By: Genevive Bi, M.D.    Medications: Scheduled Meds:    . acetaminophen  650 mg Oral Once  . azithromycin  500 mg Intravenous Q24H  . cefTRIAXone (ROCEPHIN)  IV  1 g Intravenous Q24H  . Chlorhexidine Gluconate Cloth  6 each Topical Q0600  . enoxaparin  40 mg Subcutaneous Q24H  . folic acid  1 mg Oral Daily  . furosemide  20 mg Intravenous Once  . hydroxyurea  1,000 mg Oral Daily  . mupirocin ointment  1 application Nasal BID  . oxyCODONE  80 mg Oral Q12H   Continuous Infusions:    . 0.9 % NaCl with KCl 20 mEq / L 125 mL (04/19/11 0421)   PRN Meds:.acetaminophen, acetaminophen, albuterol, alum & mag hydroxide-simeth, diphenhydrAMINE, hydrOXYzine, ipratropium, naloxone (NARCAN) injection, ondansetron (ZOFRAN) IV, ondansetron, oxycodone, sodium chloride  Assessment/Plan:  Principal Problem:  #1 Pneumonia-Resolving.Will continue IV Rocephin as well as Zithromax.  #2 . Sickle cell anemia with crisis s/p EBT.Will continue present regimem #3 . Pruritus: Resolving. We continue Atarax. For d/c 04/20/2011   LOS: 3 days   Ashish Rossetti 04/19/2011, 5:15 PM

## 2011-04-19 NOTE — Progress Notes (Signed)
Blood exchange done via red port of dl PICC, 147WG blood removed then flushed with 10cc ns. Pt. Tolerated procedure well.

## 2011-04-19 NOTE — Progress Notes (Signed)
This office note has been dictated.

## 2011-04-20 MED ORDER — MUPIROCIN 2 % EX OINT: 1.0000 "application " | TOPICAL_OINTMENT | Freq: Two times a day (BID) | CUTANEOUS | Status: AC

## 2011-04-20 MED ORDER — LEVOFLOXACIN 500 MG PO TABS: 500.0000 mg | ORAL_TABLET | Freq: Every day | ORAL | Status: AC

## 2011-04-20 MED ORDER — HYDROXYZINE HCL 25 MG PO TABS: 25.0000 mg | ORAL_TABLET | ORAL | Status: AC | PRN

## 2011-04-20 MED ORDER — OXYCODONE HCL 80 MG PO TB12: 80.0000 mg | ORAL_TABLET | Freq: Two times a day (BID) | ORAL | Status: DC

## 2011-04-20 NOTE — Progress Notes (Signed)
Per MD order, PICC line removed. Cath intact at 40cm. Vaseline pressure gauze to site, pressure held x 5min. No bleeding to site. Pt instructed to keep dressing CDI x 24 hours. Avoid heavy lifting, pushing or pulling x 24 hours,  If bleeding occurs hold pressure, if bleeding does not stop contact MD or go to the ED. Pt does not have any questions. Kanon Novosel M 

## 2011-04-20 NOTE — Progress Notes (Signed)
Mr. Mollenkopf is about the same.  He is still having pain in the right hip.  We will get some plain films on the hip today.  He has been exchange transfused 2 days in a row.  I do not think that we need to do any additional exchange transfusions.  I feel confident that we have diluted down his sickle cell blood pretty well.  His chest x-ray from yesterday did not show any infiltrates or pneumonitis.  As such, I think the antibiotics can be discontinued.  He is eating okay.  He is not having any problems with nausea or vomiting.  There is no dyspnea.  He is not out of bed too much because of the right hip discomfort.  PHYSICAL EXAMINATION:  General:  This is a well-developed, well- nourished black gentleman in no obvious distress.  Vital Signs: Temperature 97.5, pulse 85, respiratory rate 18, blood pressure 137/81, oxygen saturation in room air is 96%.  Head and Neck Exam:  Shows a normocephalic, atraumatic skull.  There are no ocular or oral lesions. There are no palpable cervical or supraclavicular lymph nodes.  Lungs: Clear bilaterally.  He has no rales, wheezes, or rhonchi.  Cardiac Exam: Regular rate and rhythm with a normal S1 and S2.  There are no murmurs, rubs, or bruits.  Abdominal Exam:  Soft with good bowel sounds.  There is no palpable abdominal mass.  There is no fluid wave.  There is no palpable hepatosplenomegaly.  Extremities:  Do show some tenderness to palpation in the right hip area.  He has no swelling in the hip.  He has limited range of motion in the right hip area.  Extremities show no clubbing, cyanosis, or edema.  Skin Exam:  Shows no rashes, ecchymoses, or petechiae.  LABORATORIES:  Labs were not done today.  We will go ahead and do a plain film of the right hip.  He will continue IV fluids.  He is on folic acid already.  He is on his Hydrea.  Again, I do not see that we need to do any exchange transfusions on him at this point in time.  Hopefully, he  will be able to be discharged to home over the weekend.    ______________________________ Josph Macho, M.D. PRE/MEDQ  D:  04/20/2011  T:  04/20/2011  Job:  [id number]

## 2011-04-20 NOTE — Progress Notes (Signed)
This office note has been dictated.

## 2011-04-20 NOTE — Discharge Summary (Signed)
DISCHARGE SUMMARY  [name]  MR#: [id number]  DOB:1981-02-16  Date of Admission: 04/16/2011 Date of Discharge: 04/20/2011  Attending Physician:Elven Laboy  Patient's PCP:No primary provider on file.  Consults:Treatment Team:  Josph Macho, MD  Discharge Diagnoses: #1 sickle cell pain crisis. #2 anemia status post exchange blood transfusion ordered by Dr. Myna Hidalgo #3 questionable pneumonia #4 leukocytosis #5 pruritus-most likely secondary to reaction to dilaudid #6 MRSA positive.  Present on Admission:  .Sickle cell anemia with crisis .Pneumonia .Pruritus    Current Discharge Medication List    START taking these medications   Details  hydrOXYzine (ATARAX/VISTARIL) 25 MG tablet Take 1 tablet (25 mg total) by mouth every 4 (four) hours as needed for itching (give 1 dose now.). Qty: 30 tablet, Refills: 1   Associated Diagnoses: Pruritus    levofloxacin (LEVAQUIN) 500 MG tablet Take 1 tablet (500 mg total) by mouth daily. Qty: 8 tablet, Refills: 0    mupirocin ointment (BACTROBAN) 2 % Apply 1 application topically 2 (two) times daily. Apply to the nostril Qty: 15 g, Refills: 0      CONTINUE these medications which have CHANGED   Details  oxyCODONE (OXYCONTIN) 80 MG 12 hr tablet Take 1 tablet (80 mg total) by mouth every 12 (twelve) hours. 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      hydroxyurea (HYDREA) 500 MG capsule Take 1,000 mg by mouth daily. May take with food to minimize GI side effects.    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 There is no history of fever. No documented history of chills or Rigors. In the ED patient was said to have been given IV dilaudid and he developed pruritus and patient was said to be itching all over. Because of with frequent scratching from the reaction, developed abrasions on the face.     Patient was admitted to general medical floor. He was started on IV normal saline will wait of KCL. Patient pain was however managed with oxycodone immediate and extended release. Other medication given to patient include Atarax for this pruritus. Patient was also given Zofran for nausea. Patient restarted on folic acid, and hydroxyurea. The patient was also treated for pneumonia with ceftriaxone and Zithromax. At that point in the management of this patient, was said to have been slightly delirious questionable secondary to narcotic use, he was given narcan and also breathing treatment. Patient's mentation improved markedly, I was to continue initial oxycodone for pain management.        Patient was evaluated by the oncologist-Dr. Myna Hidalgo,  recommended blood transfusion and subsequently patient had a EBT(exchange blood transfusion) done. Post transfusion, patient denies pain reduced markedly and was also able to ambulate within his room . DVT prophylaxis was done with Lovenox. Patient was also found to be MRSA positive and had bacitracin applied to the nostrils.  At the time patient was seen by me, he was clinically improved, pain has reduced in intensity and was ambulating within his room. At this time, no major changes made in patients management. So far patient is clinically improved. She complained of slight aches and pain but  able to ambulate. Examination of patient was essentially unremarkable and vital signs are stable . Plan is for patient to  be discharge home today.         Day of Discharge BP 138/86  Pulse 79  Temp(Src) 98 F (36.7 C) (Oral)  Resp 18  Ht 5\' 6"  (1.676 m)  Wt 68.9 kg (151 lb 14.4 oz)  BMI 24.52 kg/m2  SpO2 96%  Physical Exam: Vitals as above. HEENT-mild pallor, healing abrasions on the face. Neck-no JVD. Chest-clear to auscultation. CVS-S1 and S2 Abdomen-soft, nontender, organs not palpable, bowel sounds are positive. Extremity-non focal MSS- unremarkable Skin-normal turgor. Neuropsych-appropriate affect.   No results found for this or any previous visit (from the past 24 hour(s)).  Disposition: stable   Follow-up Appts: Discharge Orders    Future Appointments: Provider: Department: Dept Phone: Center:   06/07/2011 11:30 AM Gwendolyn A. Maisie Fus [place] [phone] None   06/07/2011 12:00 PM Josph Macho, MD [facility] [phone] None     Future Orders Please Complete By Expires   Diet - low sodium heart healthy      Increase activity slowly      Discharge instructions      Comments:   Make appointment to follow up with Dr Willey Blade in 1-2weeks Follow up with pcp in 1-2weeks      Signed: Catrell Morrone 04/20/2011, 8:11 AM

## 2011-04-23 ENCOUNTER — Telehealth: Payer: Self-pay | Admitting: Hematology & Oncology

## 2011-04-23 NOTE — Telephone Encounter (Signed)
Pt called made 1-16 appointment. MD aware

## 2011-04-25 ENCOUNTER — Ambulatory Visit (HOSPITAL_BASED_OUTPATIENT_CLINIC_OR_DEPARTMENT_OTHER): Payer: Medicare Other | Admitting: Hematology & Oncology

## 2011-04-25 VITALS — BP 122/77 | HR 68 | Temp 98.4°F | Ht 65.0 in | Wt 145.0 lb

## 2011-04-25 DIAGNOSIS — D571 Sickle-cell disease without crisis: Secondary | ICD-10-CM

## 2011-04-25 DIAGNOSIS — M25559 Pain in unspecified hip: Secondary | ICD-10-CM

## 2011-04-25 MED ORDER — OXYCODONE HCL 30 MG PO TABS: ORAL_TABLET | ORAL | Status: DC

## 2011-04-25 NOTE — Progress Notes (Signed)
DIAGNOSES: 1. Hemoglobin SS disease. 2. Status post a crisis.  CURRENT THERAPY: 1. Folic acid 1 mg p.o. daily. 2. Hydrea 500 mg p.o. b.i.d.  INTERIM HISTORY:  [name] comes in for a quick followup.  He was just discharged from the hospital for a crisis.  He is having a lot of pain in his right hip.  He got 3 "mini" exchange transfusions.  He is feeling better.  He is not having problems with fever.  He had no problems with bleeding or bruising while in the hospital.  He had a chest x-ray while in the hospital.  This was unremarkable.  PHYSICAL EXAM:  General:  This is a well-developed well-nourished white gentleman in no obvious distress.  Vital Signs:  Temperature of 98.4, pulse 68, respiratory rate 18, blood pressure 122/77.  Weight is 145. Head/Neck:  Exam shows a normocephalic, atraumatic skull.  There are no ocular or oral lesions.  There are no palpable cervical or supraclavicular lymph nodes.  Lungs:  Clear bilaterally.  Cardiac: Regular rate and rhythm with normal S1, S2.  There are no murmurs, rubs or bruits.  Abdomen:  Soft with good bowel sounds.  There is no palpable abdominal mass.  There is no fluid wave.  No palpable hepatosplenomegaly.  Back:  No tenderness over the spine, ribs, or hips. Extremities:  No clubbing, cyanosis or edema.  Neurologic:  Exam shows no focal neurological deficits.  LABORATORIES:  Not done this visit.  IMPRESSION:  [name] is a 31 year old gentleman with sickle cell disease.  He has hemoglobin SS disease.  He is doing well overall after his hospitalization.  We will just continue to maintain regular followup with him.  I do want to see him back in about 1 month just to make sure that he continues to do okay.    ______________________________ Josph Macho, M.D. PRE/MEDQ  D:  04/25/2011  T:  04/25/2011  Job:  [id number]

## 2011-04-25 NOTE — Progress Notes (Signed)
This office note has been dictated.

## 2011-05-23 ENCOUNTER — Other Ambulatory Visit (HOSPITAL_BASED_OUTPATIENT_CLINIC_OR_DEPARTMENT_OTHER): Payer: Medicare Other | Admitting: Lab

## 2011-05-23 ENCOUNTER — Telehealth: Payer: Self-pay | Admitting: Hematology & Oncology

## 2011-05-23 ENCOUNTER — Ambulatory Visit (HOSPITAL_BASED_OUTPATIENT_CLINIC_OR_DEPARTMENT_OTHER): Payer: Medicare Other | Admitting: Hematology & Oncology

## 2011-05-23 VITALS — BP 116/62 | HR 53 | Temp 98.6°F | Ht 65.0 in | Wt 144.0 lb

## 2011-05-23 DIAGNOSIS — D571 Sickle-cell disease without crisis: Secondary | ICD-10-CM

## 2011-05-23 DIAGNOSIS — D5700 Hb-SS disease with crisis, unspecified: Secondary | ICD-10-CM

## 2011-05-23 LAB — CBC WITH DIFFERENTIAL (CANCER CENTER ONLY)
BASO#: 0 10*3/uL (ref 0.0–0.2)
Eosinophils Absolute: 0.1 10*3/uL (ref 0.0–0.5)
HCT: 37.3 % — ABNORMAL LOW (ref 38.7–49.9)
LYMPH%: 36.1 % (ref 14.0–48.0)
MCV: 87 fL (ref 82–98)
MONO#: 0.7 10*3/uL (ref 0.1–0.9)
NEUT%: 51.5 % (ref 40.0–80.0)
RBC: 4.29 10*6/uL (ref 4.20–5.70)
WBC: 7.4 10*3/uL (ref 4.0–10.0)

## 2011-05-23 LAB — TECHNOLOGIST REVIEW CHCC SATELLITE: Tech Review: 5

## 2011-05-23 MED ORDER — OXYCODONE HCL 80 MG PO TB12: 80.0000 mg | ORAL_TABLET | Freq: Two times a day (BID) | ORAL | Status: DC

## 2011-05-23 MED ORDER — OXYCODONE HCL 30 MG PO TABS: ORAL_TABLET | ORAL | Status: DC

## 2011-05-23 NOTE — Telephone Encounter (Signed)
Faxed authorization request form to [facility] per pt request.

## 2011-05-23 NOTE — Progress Notes (Signed)
This office note has been dictated.

## 2011-05-24 NOTE — Progress Notes (Signed)
DIAGNOSIS:  Hemoglobin SS disease.  CURRENT THERAPY: 1. Folic acid 1 mg p.o. daily. 2. Hydrea 500 mg p.o. b.i.d.  INTERIM HISTORY:  Mr. Farruggia comes in for follow-up.  He is doing okay.  He was in the hospital about a month or so ago.  He has had no problems since we saw him a month ago.  He still has a little bit of, I think, left shoulder discomfort.  He says this is not anything that he cannot deal with at home.  He has had no nausea and vomiting.  There has been no change in bowel or bladder habits.  There has been no cough or shortness of breath.  He has had no leg swelling.  PHYSICAL EXAMINATION:  General Appearance:  This is a well-developed, well-nourished black gentleman in no obvious distress.  Vital Signs: 98.6, pulse 53, respiratory rate 20, blood pressure 116/62.  Weight is 144.  Head and Neck Exam:  Shows a normocephalic, atraumatic skull. There are no ocular or oral lesions.  There are no palpable cervical or supraclavicular lymph nodes.  Lungs:  Clear bilaterally.  Cardiac Exam: Regular rate and rhythm with a normal S1 and S2.  There are no murmurs, rubs or bruits.  Abdominal Exam:  Soft with good bowel sounds.  There is no palpable abdominal mass.  There is no fluid wave.  No palpable hepatosplenomegaly.  Back Exam:  No tenderness over the spine, ribs or hips.  Extremities:  Show some slight decreased range of motion of the left shoulder.  Extremities show no clubbing, cyanosis or edema.  Skin Exam:  No rashes, ecchymosis, or petechia.  Neurological Exam:  No focal neurological deficits.  LABORATORY STUDIES:  White cell count is 7.4, hemoglobin 13.2, hematocrit 37.3, platelet count  200,000.  IMPRESSION:  Mr. Cottrill is a 31 year old gentleman with sickle cell disease.  He has hemoglobin SS disease.  He actually does fairly well. Hopefully, he will not have a crisis for several months.  He is managing his pain quite well.  He actually has, I think,  increased hemoglobin F level secondary to the Hydrea.  I think we can probably get him back now in 2 months or so for follow- up.  We did go ahead and refill his medications today.    ______________________________ Josph Macho, M.D. PRE/MEDQ  D:  05/23/2011  T:  05/24/2011  Job:  [id number]

## 2011-05-25 LAB — RETICULOCYTES (CHCC)
ABS Retic: 392.4 10*3/uL — ABNORMAL HIGH (ref 19.0–186.0)
RBC.: 4.36 MIL/uL (ref 4.22–5.81)
Retic Ct Pct: 9 % — ABNORMAL HIGH (ref 0.4–2.3)

## 2011-05-25 LAB — HEMOGLOBINOPATHY EVALUATION: Hgb A2 Quant: 3.1 % (ref 2.2–3.2)

## 2011-06-07 ENCOUNTER — Other Ambulatory Visit: Payer: Medicare Other | Admitting: Lab

## 2011-06-07 ENCOUNTER — Ambulatory Visit: Payer: Medicare Other | Admitting: Hematology & Oncology

## 2011-06-20 ENCOUNTER — Other Ambulatory Visit: Payer: Self-pay | Admitting: *Deleted

## 2011-06-20 DIAGNOSIS — D5700 Hb-SS disease with crisis, unspecified: Secondary | ICD-10-CM

## 2011-06-20 MED ORDER — OXYCODONE HCL 30 MG PO TABS: ORAL_TABLET | ORAL | Status: DC

## 2011-06-20 MED ORDER — OXYCODONE HCL 80 MG PO TB12: 80.0000 mg | ORAL_TABLET | Freq: Two times a day (BID) | ORAL | Status: DC

## 2011-06-20 NOTE — Telephone Encounter (Signed)
Pt called requesting Oxycontin and Oxy-IR refills. Within the timeframe for refills. Will route to Dr Myna Hidalgo to approve.

## 2011-07-19 ENCOUNTER — Other Ambulatory Visit (HOSPITAL_BASED_OUTPATIENT_CLINIC_OR_DEPARTMENT_OTHER): Payer: Medicare Other | Admitting: Lab

## 2011-07-19 ENCOUNTER — Ambulatory Visit (HOSPITAL_BASED_OUTPATIENT_CLINIC_OR_DEPARTMENT_OTHER): Payer: Medicare Other | Admitting: Hematology & Oncology

## 2011-07-19 VITALS — BP 116/70 | HR 81 | Temp 97.6°F | Ht 66.0 in | Wt 151.0 lb

## 2011-07-19 DIAGNOSIS — D5720 Sickle-cell/Hb-C disease without crisis: Secondary | ICD-10-CM

## 2011-07-19 DIAGNOSIS — D571 Sickle-cell disease without crisis: Secondary | ICD-10-CM

## 2011-07-19 DIAGNOSIS — D5700 Hb-SS disease with crisis, unspecified: Secondary | ICD-10-CM

## 2011-07-19 LAB — MANUAL DIFFERENTIAL (CHCC SATELLITE)
ALC: 2.2 10*3/uL (ref 0.9–3.3)
ANC (CHCC HP manual diff): 4.9 10*3/uL (ref 1.5–6.5)
PLT EST ~~LOC~~: ADEQUATE
nRBC: 9 % — ABNORMAL HIGH (ref 0–0)

## 2011-07-19 LAB — CBC WITH DIFFERENTIAL (CANCER CENTER ONLY)
HCT: 32.9 % — ABNORMAL LOW (ref 38.7–49.9)
MCHC: 37.1 g/dL — ABNORMAL HIGH (ref 32.0–35.9)
Platelets: 328 10*3/uL (ref 145–400)
RDW: 21.3 % — ABNORMAL HIGH (ref 11.1–15.7)
WBC: 8.8 10*3/uL (ref 4.0–10.0)

## 2011-07-19 MED ORDER — OXYCODONE HCL 30 MG PO TABS: ORAL_TABLET | ORAL | Status: DC

## 2011-07-19 MED ORDER — OXYCODONE HCL 80 MG PO TB12: 80.0000 mg | ORAL_TABLET | Freq: Two times a day (BID) | ORAL | Status: DC

## 2011-07-19 NOTE — Progress Notes (Signed)
This office note has been dictated.

## 2011-07-20 NOTE — Progress Notes (Signed)
DIAGNOSIS:  Hemoglobin SS disease.  CURRENT THERAPY: 1. Folic acid 1 mg p.o. daily. 2. Hydrea 500 mg p.o. b.i.d.  INTERIM HISTORY:  [name] comes in for his followup.  He has done very well.  He was last hospitalized, I think back in January.  Since then, he has been doing quite well.  He has had no problems crises-wise.  He has had no problems with fever, sweats, or chills.  He has had no real bony episodes.  His last hemoglobin electrophoresis done back in February showed hemoglobin A of 26% and hemoglobin S of 63%.  He does have a good level of hemoglobin S of about 8%.  This is directly related to his Hydrea therapy.  He has had no change in bowel or bladder habits.  He is on OxyContin and oxycodone for chronic pain issues.  These have helped quite a bit.  He is very functional.  PHYSICAL EXAMINATION:  General:  This is a well-developed, well- nourished black gentleman in no obvious distress.  Vital Signs: Temperature 97.4, pulse 81, respiratory rate 18, blood pressure 116/70, weight is 151.  Head and Neck Exam:  Shows a normocephalic, atraumatic skull.  There are no ocular or oral lesions.  There are no palpable cervical or supraclavicular lymph nodes.  Lungs:  Clear to percussion and auscultation bilaterally.  Cardiac Exam:  Regular rate and rhythm with a normal S1 and S2.  There are no murmurs, rubs, or bruits. Abdominal Exam:  Soft with good bowel sounds.  There is no palpable abdominal mass.  There is no palpable hepatosplenomegaly.  Back Exam: No tenderness over the spine, ribs, or hips.  Extremities:  Show no clubbing, cyanosis, or edema.  He has no tenderness over his long bones. Neurological Exam:  Shows no focal neurological deficits.  Skin Exam: Shows no ulcerations in his lower legs.  LABORATORY STUDIES:  White cell count is 8.8, hemoglobin 12.2, hematocrit 32.9, platelet count 328.  IMPRESSION:  [name] is a 31 year old African American  gentleman with hemoglobin SS disease.  Currently, he is doing well.  For [name], he typically begins to hemolyze quite aggressively when he goes into a crisis.  His blood count looks good with his hemoglobin where I would like it to be.  There are no issues with iron overload, which is a blessing.  His last ferritin was 611 back in February.  We typically do not have to transfuse him unless he is hospitalized.  We will plan to get him back in 2 months' time now.  I did refill his pain medications today.  Again, he has done well with these and these have kept him quite functional.    ______________________________ Josph Macho, M.D. PRE/MEDQ  D:  07/19/2011  T:  07/20/2011  Job:  [id number]

## 2011-07-23 LAB — IRON AND TIBC: TIBC: 285 ug/dL (ref 215–435)

## 2011-07-23 LAB — FERRITIN: Ferritin: 282 ng/mL (ref 22–322)

## 2011-07-23 LAB — HEMOGLOBINOPATHY EVALUATION
Hemoglobin Other: 0 %
Hgb A2 Quant: 3.2 % (ref 2.2–3.2)
Hgb F Quant: 8.7 % — ABNORMAL HIGH (ref 0.0–2.0)
Hgb S Quant: 69.3 % — ABNORMAL HIGH

## 2011-07-23 LAB — RETICULOCYTES (CHCC): RBC.: 3.94 MIL/uL — ABNORMAL LOW (ref 4.22–5.81)

## 2011-08-20 ENCOUNTER — Other Ambulatory Visit: Payer: Self-pay | Admitting: *Deleted

## 2011-08-20 DIAGNOSIS — D5700 Hb-SS disease with crisis, unspecified: Secondary | ICD-10-CM

## 2011-08-20 MED ORDER — OXYCODONE HCL 80 MG PO TB12: 80.0000 mg | ORAL_TABLET | Freq: Two times a day (BID) | ORAL | Status: DC

## 2011-08-20 MED ORDER — OXYCODONE HCL 30 MG PO TABS: ORAL_TABLET | ORAL | Status: DC

## 2011-08-20 NOTE — Telephone Encounter (Addendum)
Pt left message on voicemail requesting rx for Oxycontin and Roxicodone at approx 1310. He then called at 1620 stating that he was on his way here to get them. Nolon Rod to let him know that it may be a while as Dr. Myna Hidalgo is seeing new patients and he should have waited for a return call. 08/21/11: Left message on voicemail stating his prescriptions were at the front desk ready for pick up.

## 2011-09-10 ENCOUNTER — Encounter (HOSPITAL_COMMUNITY): Payer: Self-pay | Admitting: Emergency Medicine

## 2011-09-10 ENCOUNTER — Emergency Department (HOSPITAL_COMMUNITY)
Admission: EM | Admit: 2011-09-10 | Discharge: 2011-09-10 | Disposition: A | Discharge status: Home / Self Care | Payer: Medicare Other | Source: Home / Self Care | Attending: Emergency Medicine | Admitting: Emergency Medicine

## 2011-09-10 ENCOUNTER — Non-Acute Institutional Stay (HOSPITAL_COMMUNITY)
Admission: AD | Admit: 2011-09-10 | Discharge: 2011-09-11 | Disposition: A | Discharge status: Home / Self Care | Payer: Medicare Other | Source: Home / Self Care | Attending: Internal Medicine | Admitting: Internal Medicine

## 2011-09-10 ENCOUNTER — Emergency Department (HOSPITAL_COMMUNITY): Payer: Medicare Other

## 2011-09-10 DIAGNOSIS — Z79899 Other long term (current) drug therapy: Secondary | ICD-10-CM | POA: Insufficient documentation

## 2011-09-10 DIAGNOSIS — D5700 Hb-SS disease with crisis, unspecified: Secondary | ICD-10-CM | POA: Insufficient documentation

## 2011-09-10 DIAGNOSIS — G8929 Other chronic pain: Secondary | ICD-10-CM

## 2011-09-10 DIAGNOSIS — R52 Pain, unspecified: Secondary | ICD-10-CM | POA: Insufficient documentation

## 2011-09-10 DIAGNOSIS — D571 Sickle-cell disease without crisis: Secondary | ICD-10-CM | POA: Insufficient documentation

## 2011-09-10 DIAGNOSIS — R112 Nausea with vomiting, unspecified: Secondary | ICD-10-CM

## 2011-09-10 LAB — DIFFERENTIAL
Basophils Absolute: 0.2 10*3/uL — ABNORMAL HIGH (ref 0.0–0.1)
Eosinophils Absolute: 0 10*3/uL (ref 0.0–0.7)
Lymphs Abs: 6.7 10*3/uL — ABNORMAL HIGH (ref 0.7–4.0)
Monocytes Absolute: 0.8 10*3/uL (ref 0.1–1.0)
Neutro Abs: 13.1 10*3/uL — ABNORMAL HIGH (ref 1.7–7.7)

## 2011-09-10 LAB — RETICULOCYTES
RBC.: 3.87 MIL/uL — ABNORMAL LOW (ref 4.22–5.81)
Retic Count, Absolute: 1021.7 10*3/uL — ABNORMAL HIGH (ref 19.0–186.0)
Retic Ct Pct: 26.4 % — ABNORMAL HIGH (ref 0.4–3.1)

## 2011-09-10 LAB — CBC
MCHC: 35.8 g/dL (ref 30.0–36.0)
Platelets: 216 10*3/uL (ref 150–400)
RDW: 24.3 % — ABNORMAL HIGH (ref 11.5–15.5)
WBC: 20.8 10*3/uL — ABNORMAL HIGH (ref 4.0–10.5)

## 2011-09-10 MED ORDER — DIPHENHYDRAMINE HCL 50 MG/ML IJ SOLN
25.0000 mg | Freq: Once | INTRAMUSCULAR | Status: AC
  Administered 2011-09-10: 25 mg via INTRAVENOUS
  Filled 2011-09-10: qty 1

## 2011-09-10 MED ORDER — DEXTROSE-NACL 5-0.45 % IV SOLN
INTRAVENOUS | Status: DC
  Administered 2011-09-10 – 2011-09-11 (×2): via INTRAVENOUS

## 2011-09-10 MED ORDER — HYDROMORPHONE HCL PF 2 MG/ML IJ SOLN
2.0000 mg | INTRAMUSCULAR | Status: DC | PRN
  Administered 2011-09-10 – 2011-09-11 (×5): 2 mg via INTRAVENOUS
  Filled 2011-09-10 (×5): qty 1

## 2011-09-10 MED ORDER — HYDROMORPHONE HCL PF 1 MG/ML IJ SOLN
1.0000 mg | Freq: Once | INTRAMUSCULAR | Status: AC
  Administered 2011-09-10: 1 mg via INTRAVENOUS
  Filled 2011-09-10: qty 1

## 2011-09-10 MED ORDER — DIPHENHYDRAMINE HCL 50 MG/ML IJ SOLN
12.5000 mg | INTRAMUSCULAR | Status: DC | PRN
  Administered 2011-09-10 – 2011-09-11 (×3): 25 mg via INTRAVENOUS
  Filled 2011-09-10 (×3): qty 1

## 2011-09-10 MED ORDER — FENTANYL CITRATE 0.05 MG/ML IJ SOLN
100.0000 ug | Freq: Once | INTRAMUSCULAR | Status: AC
  Administered 2011-09-10: 100 ug via INTRAVENOUS
  Filled 2011-09-10: qty 2

## 2011-09-10 MED ORDER — ONDANSETRON HCL 4 MG/2ML IJ SOLN
4.0000 mg | Freq: Once | INTRAMUSCULAR | Status: AC
  Administered 2011-09-10: 4 mg via INTRAVENOUS
  Filled 2011-09-10: qty 2

## 2011-09-10 MED ORDER — SODIUM CHLORIDE 0.9 % IV SOLN
1000.0000 mL | INTRAVENOUS | Status: DC
  Administered 2011-09-10: 1000 mL via INTRAVENOUS

## 2011-09-10 MED ORDER — FENTANYL CITRATE 0.05 MG/ML IJ SOLN
75.0000 ug | Freq: Once | INTRAMUSCULAR | Status: AC
  Administered 2011-09-10: 75 ug via INTRAVENOUS
  Filled 2011-09-10: qty 2

## 2011-09-10 MED ORDER — SODIUM CHLORIDE 0.9 % IV SOLN
1000.0000 mL | Freq: Once | INTRAVENOUS | Status: AC
  Administered 2011-09-10: 1000 mL via INTRAVENOUS

## 2011-09-10 MED ORDER — ONDANSETRON HCL 4 MG PO TABS: 4.0000 mg | ORAL_TABLET | ORAL | Status: DC | PRN

## 2011-09-10 MED ORDER — DIPHENHYDRAMINE HCL 25 MG PO CAPS: 25.0000 mg | ORAL_CAPSULE | ORAL | Status: DC | PRN

## 2011-09-10 MED ORDER — ONDANSETRON HCL 4 MG/2ML IJ SOLN
4.0000 mg | INTRAMUSCULAR | Status: DC | PRN
  Administered 2011-09-10 – 2011-09-11 (×3): 4 mg via INTRAVENOUS
  Filled 2011-09-10 (×3): qty 2

## 2011-09-10 MED ORDER — ACETAMINOPHEN 325 MG PO TABS
650.0000 mg | ORAL_TABLET | Freq: Four times a day (QID) | ORAL | Status: DC | PRN
  Administered 2011-09-11: 650 mg via ORAL
  Filled 2011-09-10: qty 2

## 2011-09-10 NOTE — ED Notes (Signed)
MD at bedside. 

## 2011-09-10 NOTE — ED Notes (Signed)
NP over [facility]le clinic in to see patient and to talk with Dr. Lynelle Doctor

## 2011-09-10 NOTE — ED Notes (Signed)
Unable to get all labs. RN aware.

## 2011-09-10 NOTE — ED Notes (Signed)
Pt states he has been here an hour w/out being treated...placed in room minutes prior..macular degeneration informed of pt status. Pt educated ie S[facility]le cell clinic and tx for acute onset pain. MD will see pt and send him to sickle cell clinic for treatment.Marland KitchenMarland KitchenMarland Kitchen

## 2011-09-10 NOTE — ED Notes (Signed)
[facility] contacted and they stated they had no knowlege that the patient was coming. They stated they would page hospitalist on call.

## 2011-09-10 NOTE — ED Notes (Signed)
Pt presenting to ed with c/o sickle cell crisis onset this am pt states he is having pain in his back, legs, shoulders, chest

## 2011-09-10 NOTE — Discharge Instructions (Signed)
YOU WILL CONTINUE YOUR TREATMENT AT THE [facility]

## 2011-09-10 NOTE — H&P (Signed)
[name] is an 31 y.o. male with a hx of sickle cell with last hospital admission in April 2013.   Chief Complaint: pain HPI: Patient presented to the [facility] ED today with chief complaint of generalized pain. He usually takes Oxy IR and SR routinely at home and did today as well. However, the pain was not relieved by these, so he came to ED. Pain began early this AM described as stabbing, rated a 10/10. He states that when his pain exacerbates to this number, the only meds that help are IV. Patient currently denies any SOB or nausea. When trying to illicit ROS and history from pt, he is belligerent and doesn't want to participate. He states "I just want my F-ing pain medicine now". Also, of note, Dilaudid is listed as an allergy on his chart. When asked about this, pt says he can't take large doses, like 4mg , because it makes him "loopy". He does fine with 1-2 mg.  CXR in ED was benign and Hgb was 12.4. Therefore, it was decided pt was an appropriate candidate for the 23 hour OBS unit in the [facility]. [facility] asked to admit.   Past Medical History  Diagnosis Date  . Sickle cell anemia     Past Surgical History  Procedure Date  . Cholecystectomy     Family History  Problem Relation Age of Onset  . Sickle cell trait     Social History:  reports that he has never smoked. He does not have any smokeless tobacco history on file. He reports that he drinks alcohol. He reports that he does not use illicit drugs.  Allergies:  Allergies  Allergen Reactions  . Morphine And Related Rash  . Dilaudid (Hydromorphone Hcl)     dilusional only with 4mg  dose. Can't take "too much"  . Promethazine Hcl Hives    Medications Prior to Admission  Medication Sig Dispense Refill  . Cyanocobalamin (VITAMIN B 12 PO) Take 1 tablet by mouth.      . folic acid (FOLVITE) 1 MG tablet Take 1 mg by mouth daily.        . hydroxyurea (HYDREA) 500 MG capsule Take 1,000 mg by mouth daily. May  take with food to minimize GI side effects.      . Multiple Vitamin (MULITIVITAMIN WITH MINERALS) TABS Take 1 tablet by mouth daily.      Marland Kitchen oxyCODONE (OXYCONTIN) 80 MG 12 hr tablet Take 1 tablet (80 mg total) by mouth every 12 (twelve) hours.  60 tablet  0  . oxycodone (ROXICODONE) 30 MG immediate release tablet 1 - 2 tabs every 6 hours prn sickle cell crisis pain  120 tablet  0    Results for orders placed during the hospital encounter of 09/10/11 (from the past 48 hour(s))  CBC     Status: Abnormal   Collection Time   09/10/11  9:20 PM      Component Value Range Comment   WBC 20.8 (*) 4.0 - 10.5 (K/uL)    RBC 3.87 (*) 4.22 - 5.81 (MIL/uL)    Hemoglobin 12.4 (*) 13.0 - 17.0 (g/dL)    HCT 40.9 (*) 81.1 - 52.0 (%)    MCV 89.4  78.0 - 100.0 (fL)    MCH 32.0  26.0 - 34.0 (pg)    MCHC 35.8  30.0 - 36.0 (g/dL)    RDW 91.4 (*) 78.2 - 15.5 (%)    Platelets 216  150 - 400 (K/uL)  RETICULOCYTES     Status: Abnormal   Collection Time   09/10/11  9:20 PM      Component Value Range Comment   Retic Ct Pct 26.4 (*) 0.4 - 3.1 (%) RESULTS CONFIRMED BY MANUAL DILUTION   RBC. 3.87 (*) 4.22 - 5.81 (MIL/uL)    Retic Count, Manual 1021.7 (*) 19.0 - 186.0 (K/uL)   DIFFERENTIAL     Status: Abnormal   Collection Time   09/10/11  9:20 PM      Component Value Range Comment   Neutrophils Relative 63  43 - 77 (%)    Lymphocytes Relative 32  12 - 46 (%)    Monocytes Relative 4  3 - 12 (%)    Eosinophils Relative 0  0 - 5 (%)    Basophils Relative 1  0 - 1 (%)    Neutro Abs 13.1 (*) 1.7 - 7.7 (K/uL)    Lymphs Abs 6.7 (*) 0.7 - 4.0 (K/uL)    Monocytes Absolute 0.8  0.1 - 1.0 (K/uL)    Eosinophils Absolute 0.0  0.0 - 0.7 (K/uL)    Basophils Absolute 0.2 (*) 0.0 - 0.1 (K/uL)    RBC Morphology SICKLE CELLS      WBC Morphology MILD LEFT SHIFT (1-5% METAS, OCC MYELO, OCC BANDS)      Dg Chest 2 View  09/10/2011  *RADIOLOGY REPORT*  Clinical Data: Sickle cell crisis.  CHEST - 2 VIEW  Comparison: 04/19/2011   Findings: AP and lateral view the chest shows a stable enlargement the cardiopericardial silhouette with vascular congestion.  There is some interstitial coarsening without overt edema.  No pleural effusion. Telemetry leads overlie the chest.  IMPRESSION: No focal airspace consolidation.  Original Report Authenticated By: ERIC A. MANSELL, M.D.    Review of Systems  Constitutional: Negative for fever and chills.       See HPI. Pt belligerent and not cooperative for ROS or complete PE.   HENT: Negative for ear pain, sore throat and neck pain.   Eyes: Negative for blurred vision and double vision.  Respiratory: Negative for cough, sputum production, shortness of breath and wheezing.   Cardiovascular: Positive for chest pain. Negative for palpitations and orthopnea.  Gastrointestinal: Positive for abdominal pain. Negative for nausea, vomiting and diarrhea.  Genitourinary: Negative for dysuria.  Musculoskeletal: Positive for myalgias and back pain.  Skin: Negative for itching and rash.  Neurological: Negative for focal weakness, loss of consciousness and headaches.  Psychiatric/Behavioral: Negative.      T 100.6  P 99 R 16  BP 144/71  SaO2 94% on 2LNC Physical Exam  Constitutional: He is oriented to person, place, and time. He appears well-nourished.  HENT:  Head: Normocephalic and atraumatic.  Eyes: Right eye exhibits no discharge. Left eye exhibits no discharge. No scleral icterus.  Neck: Neck supple.  Cardiovascular: Normal rate, regular rhythm, S1 normal and S2 normal.  Exam reveals no gallop and no friction rub.   No murmur heard. Respiratory: Effort normal. No respiratory distress. He has decreased breath sounds in the right lower field and the left lower field.  GI: Soft. There is generalized tenderness. There is no guarding.  Genitourinary:       deferred  Musculoskeletal: Normal range of motion.  Neurological: He is alert and oriented to person, place, and time.  Skin: Skin is  warm and dry.  Psychiatric: His affect is blunt. He is agitated and aggressive.     Assessment/Plan 1. Sickle cell without noted  acute anemia or acute chest syndrome. CXR neg in ED. Labs reviewed. IVF/hydration. Cont O2 to keep sats over 92%. Cont pain meds, careful not to exceed 4mg  of Dilaudid in one dose. Monitor O2 sats frequently and if falling, hold narcotics. Admit to sickle cell center under OBS status. Dr. August Saucer to resume care in a.m.   H&P performed and documented by Evern Bio, RN, NP student under the supervision of Maren Reamer, NP. I have personally reviewed and edited this note.  Dr. Onalee Hua to [place].  KIRBY, KAREN 09/10/2011, 11:11 PM  Patient seen and examined with elevated white count no focal source of infection urinalysis not checked. Provide IV pain management with Dilaudid monitor for any fevers. Patient observed in sickle cell unit Dr. August Saucer take over care in the morning. On urinalysis.

## 2011-09-10 NOTE — ED Provider Notes (Signed)
History     CSN: [id number]  Arrival date & time 09/10/11  1541   First MD Initiated Contact with Patient 09/10/11 1644      Chief Complaint  Patient presents with  . Sickle Cell Pain Crisis    HPI The patient presents to the emergency room with complaints of sickle cell crisis. Patient states his symptoms started this morning. He is having pain in his back, legs, shoulders and chest. Patient denies any shortness of breath. He denies any fever or vomiting or diarrhea. The pain is severe and is described as a 10 out of 10 it is aching in nature. He has been taking all have his prescribed medications without relief. Past Medical History  Diagnosis Date  . Sickle cell anemia     Past Surgical History  Procedure Date  . Cholecystectomy     Family History  Problem Relation Age of Onset  . Sickle cell trait      History  Substance Use Topics  . Smoking status: Never Smoker   . Smokeless tobacco: Not on file  . Alcohol Use: Yes     occassionally      Review of Systems  All other systems reviewed and are negative.    Allergies  Morphine and related; Dilaudid; and Promethazine hcl  Home Medications   Current Outpatient Rx  Name Route Sig Dispense Refill  . VITAMIN B 12 PO Oral Take 1 tablet by mouth.    . FOLIC ACID 1 MG PO TABS Oral Take 1 mg by mouth daily.      Marland Kitchen HYDROXYUREA 500 MG PO CAPS Oral Take 1,000 mg by mouth daily. May take with food to minimize GI side effects.    . ADULT MULTIVITAMIN W/MINERALS CH Oral Take 1 tablet by mouth daily.    . OXYCODONE HCL ER 80 MG PO TB12 Oral Take 1 tablet (80 mg total) by mouth every 12 (twelve) hours. 60 tablet 0  . OXYCODONE HCL 30 MG PO TABS  1 - 2 tabs every 6 hours prn sickle cell crisis pain 120 tablet 0    BP 132/66  Pulse 96  Temp(Src) 99.4 F (37.4 C) (Oral)  Resp 16  SpO2 90%  Physical Exam  Nursing note and vitals reviewed. Constitutional: He appears well-developed and well-nourished. No distress.    Uncomfortable appearing  HENT:  Head: Normocephalic and atraumatic.  Right Ear: External ear normal.  Left Ear: External ear normal.  Eyes: Conjunctivae are normal. Right eye exhibits no discharge. Left eye exhibits no discharge. No scleral icterus.  Neck: Neck supple. No tracheal deviation present.  Cardiovascular: Normal rate, regular rhythm and intact distal pulses.   Pulmonary/Chest: Effort normal and breath sounds normal. No stridor. No respiratory distress. He has no wheezes. He has no rales.  Abdominal: Soft. Bowel sounds are normal. He exhibits no distension. There is no tenderness. There is no rebound and no guarding.  Musculoskeletal: He exhibits tenderness. He exhibits no edema.       Diffuse tenderness to extremities, no erythema or effusions  Neurological: He is alert. He has normal strength. No sensory deficit. Cranial nerve deficit:  no gross defecits noted. He exhibits normal muscle tone. He displays no seizure activity. Coordination normal.  Skin: Skin is warm and dry. No rash noted.       No diaphoresis  Psychiatric: He has a normal mood and affect.    ED Course  Procedures (including critical care time)  Rate: 80  Rhythm: normal  sinus rhythm  QRS Axis: normal  Intervals: normal  ST/T Wave abnormalities: normal  Conduction Disutrbances:none  Narrative Interpretation: Consider left ventricular hypertrophy  Old EKG Reviewed: No significant changes   Labs Reviewed  CBC  DIFFERENTIAL  BASIC METABOLIC PANEL  RETICULOCYTES   Dg Chest 2 View  09/10/2011  *RADIOLOGY REPORT*  Clinical Data: Sickle cell crisis.  CHEST - 2 VIEW  Comparison: 04/19/2011  Findings: AP and lateral view the chest shows a stable enlargement the cardiopericardial silhouette with vascular congestion.  There is some interstitial coarsening without overt edema.  No pleural effusion. Telemetry leads overlie the chest.  IMPRESSION: No focal airspace consolidation.  Original Report Authenticated By:  ERIC A. MANSELL, M.D.     1. Sickle cell crisis       MDM  The patient presents with recurrent sickle cell crisis. And pain medications have been ordered. I discussed the case with Dr. August Saucer. Patient will continue to receive treatment at the sickle cell clinic assuming his chest x-ray is negative.  5:46 PM Still having pain.  Additional fentanyl ordered.  6:42 PM STILL Having pain.  No sign of acute chest syndrome.  Will continue his treatment at the sickle cell center.          Celene Kras, MD 09/10/11 [id number]

## 2011-09-11 ENCOUNTER — Other Ambulatory Visit: Payer: Medicare Other | Admitting: Lab

## 2011-09-11 ENCOUNTER — Encounter (HOSPITAL_COMMUNITY): Payer: Self-pay | Admitting: *Deleted

## 2011-09-11 ENCOUNTER — Inpatient Hospital Stay (HOSPITAL_COMMUNITY)
Admission: EM | Admit: 2011-09-11 | Discharge: 2011-09-24 | DRG: 811 | Disposition: A | Discharge status: Home / Self Care | Payer: Medicare Other | Attending: Family Medicine | Admitting: Family Medicine

## 2011-09-11 ENCOUNTER — Ambulatory Visit: Payer: Medicare Other | Admitting: Hematology & Oncology

## 2011-09-11 ENCOUNTER — Emergency Department (HOSPITAL_COMMUNITY): Payer: Medicare Other

## 2011-09-11 DIAGNOSIS — J9600 Acute respiratory failure, unspecified whether with hypoxia or hypercapnia: Secondary | ICD-10-CM

## 2011-09-11 DIAGNOSIS — L299 Pruritus, unspecified: Secondary | ICD-10-CM | POA: Diagnosis present

## 2011-09-11 DIAGNOSIS — K5900 Constipation, unspecified: Secondary | ICD-10-CM | POA: Diagnosis not present

## 2011-09-11 DIAGNOSIS — R7989 Other specified abnormal findings of blood chemistry: Secondary | ICD-10-CM | POA: Diagnosis present

## 2011-09-11 DIAGNOSIS — R509 Fever, unspecified: Secondary | ICD-10-CM

## 2011-09-11 DIAGNOSIS — J189 Pneumonia, unspecified organism: Secondary | ICD-10-CM

## 2011-09-11 DIAGNOSIS — R5081 Fever presenting with conditions classified elsewhere: Secondary | ICD-10-CM | POA: Diagnosis present

## 2011-09-11 DIAGNOSIS — M87059 Idiopathic aseptic necrosis of unspecified femur: Secondary | ICD-10-CM | POA: Diagnosis present

## 2011-09-11 DIAGNOSIS — D5701 Hb-SS disease with acute chest syndrome: Secondary | ICD-10-CM

## 2011-09-11 DIAGNOSIS — D5700 Hb-SS disease with crisis, unspecified: Principal | ICD-10-CM

## 2011-09-11 DIAGNOSIS — E872 Acidosis, unspecified: Secondary | ICD-10-CM | POA: Diagnosis present

## 2011-09-11 DIAGNOSIS — D72829 Elevated white blood cell count, unspecified: Secondary | ICD-10-CM | POA: Diagnosis present

## 2011-09-11 DIAGNOSIS — J9602 Acute respiratory failure with hypercapnia: Secondary | ICD-10-CM | POA: Diagnosis present

## 2011-09-11 DIAGNOSIS — D7289 Other specified disorders of white blood cells: Secondary | ICD-10-CM

## 2011-09-11 DIAGNOSIS — D759 Disease of blood and blood-forming organs, unspecified: Secondary | ICD-10-CM | POA: Diagnosis present

## 2011-09-11 LAB — CBC
HCT: 24.9 % — ABNORMAL LOW (ref 39.0–52.0)
MCV: 82.7 fL (ref 78.0–100.0)
RDW: 22.6 % — ABNORMAL HIGH (ref 11.5–15.5)
WBC: 17.6 10*3/uL — ABNORMAL HIGH (ref 4.0–10.5)

## 2011-09-11 LAB — URINALYSIS, ROUTINE W REFLEX MICROSCOPIC
Bilirubin Urine: NEGATIVE
Bilirubin Urine: NEGATIVE
Glucose, UA: NEGATIVE mg/dL
Ketones, ur: NEGATIVE mg/dL
Leukocytes, UA: NEGATIVE
Nitrite: NEGATIVE
Protein, ur: NEGATIVE mg/dL
Protein, ur: NEGATIVE mg/dL
Urobilinogen, UA: 1 mg/dL (ref 0.0–1.0)
pH: 5.5 (ref 5.0–8.0)

## 2011-09-11 LAB — BLOOD GAS, ARTERIAL
Acid-base deficit: 1.9 mmol/L (ref 0.0–2.0)
O2 Saturation: 94.1 %
TCO2: 22.9 mmol/L (ref 0–100)
pCO2 arterial: 49.4 mmHg — ABNORMAL HIGH (ref 35.0–45.0)

## 2011-09-11 LAB — BASIC METABOLIC PANEL
BUN: 10 mg/dL (ref 6–23)
CO2: 21 mEq/L (ref 19–32)
Calcium: 8.2 mg/dL — ABNORMAL LOW (ref 8.4–10.5)
Glucose, Bld: 146 mg/dL — ABNORMAL HIGH (ref 70–99)
Sodium: 132 mEq/L — ABNORMAL LOW (ref 135–145)

## 2011-09-11 LAB — RETICULOCYTES
RBC.: 3.01 MIL/uL — ABNORMAL LOW (ref 4.22–5.81)
Retic Ct Pct: 25 % — ABNORMAL HIGH (ref 0.4–3.1)

## 2011-09-11 LAB — MRSA PCR SCREENING: MRSA by PCR: POSITIVE — AB

## 2011-09-11 LAB — DIFFERENTIAL
Eosinophils Relative: 0 % (ref 0–5)
Lymphs Abs: 3.2 10*3/uL (ref 0.7–4.0)
Monocytes Relative: 16 % — ABNORMAL HIGH (ref 3–12)
Neutro Abs: 11.1 10*3/uL — ABNORMAL HIGH (ref 1.7–7.7)

## 2011-09-11 LAB — CARDIAC PANEL(CRET KIN+CKTOT+MB+TROPI)
CK, MB: 4.5 ng/mL — ABNORMAL HIGH (ref 0.3–4.0)
Relative Index: 0.3 (ref 0.0–2.5)
Total CK: 1296 U/L — ABNORMAL HIGH (ref 7–232)

## 2011-09-11 LAB — URINE MICROSCOPIC-ADD ON

## 2011-09-11 LAB — LACTIC ACID, PLASMA: Lactic Acid, Venous: 1.1 mmol/L (ref 0.5–2.2)

## 2011-09-11 MED ORDER — DIPHENHYDRAMINE HCL 50 MG/ML IJ SOLN
50.0000 mg | Freq: Once | INTRAMUSCULAR | Status: AC
  Administered 2011-09-11: 50 mg via INTRAVENOUS

## 2011-09-11 MED ORDER — FOLIC ACID 1 MG PO TABS
1.0000 mg | ORAL_TABLET | Freq: Every day | ORAL | Status: DC
  Administered 2011-09-11 – 2011-09-24 (×14): 1 mg via ORAL
  Filled 2011-09-11 (×14): qty 1

## 2011-09-11 MED ORDER — ADULT MULTIVITAMIN W/MINERALS CH
1.0000 | ORAL_TABLET | Freq: Every day | ORAL | Status: DC
  Administered 2011-09-11 – 2011-09-24 (×14): 1 via ORAL
  Filled 2011-09-11 (×14): qty 1

## 2011-09-11 MED ORDER — PIPERACILLIN-TAZOBACTAM 3.375 G IVPB: 3.3750 g | Freq: Once | INTRAVENOUS | Status: DC

## 2011-09-11 MED ORDER — HYDROXYZINE HCL 50 MG PO TABS
50.0000 mg | ORAL_TABLET | ORAL | Status: DC | PRN
  Administered 2011-09-11 – 2011-09-24 (×11): 50 mg via ORAL
  Filled 2011-09-11 (×13): qty 1

## 2011-09-11 MED ORDER — PIPERACILLIN-TAZOBACTAM 3.375 G IVPB 30 MIN
3.3750 g | Freq: Once | INTRAVENOUS | Status: AC
  Administered 2011-09-11: 3.375 g via INTRAVENOUS
  Filled 2011-09-11: qty 50

## 2011-09-11 MED ORDER — HYDROMORPHONE HCL PF 2 MG/ML IJ SOLN
2.0000 mg | Freq: Once | INTRAMUSCULAR | Status: AC
  Administered 2011-09-11: 2 mg via INTRAVENOUS
  Filled 2011-09-11: qty 1

## 2011-09-11 MED ORDER — HYDROMORPHONE HCL PF 1 MG/ML IJ SOLN
1.0000 mg | Freq: Once | INTRAMUSCULAR | Status: AC
  Administered 2011-09-11: 1 mg via INTRAVENOUS
  Filled 2011-09-11: qty 1

## 2011-09-11 MED ORDER — GI COCKTAIL ~~LOC~~
30.0000 mL | Freq: Once | ORAL | Status: AC
  Administered 2011-09-11: 30 mL via ORAL
  Filled 2011-09-11: qty 30

## 2011-09-11 MED ORDER — HYDROXYUREA 500 MG PO CAPS: 1000.0000 mg | ORAL_CAPSULE | Freq: Every day | ORAL | Status: DC

## 2011-09-11 MED ORDER — ENOXAPARIN SODIUM 40 MG/0.4ML ~~LOC~~ SOLN
40.0000 mg | SUBCUTANEOUS | Status: DC
  Administered 2011-09-11 – 2011-09-18 (×7): 40 mg via SUBCUTANEOUS
  Filled 2011-09-11 (×9): qty 0.4

## 2011-09-11 MED ORDER — ONDANSETRON HCL 4 MG/2ML IJ SOLN
4.0000 mg | Freq: Once | INTRAMUSCULAR | Status: AC
  Administered 2011-09-11: 4 mg via INTRAVENOUS
  Filled 2011-09-11: qty 2

## 2011-09-11 MED ORDER — ACETAMINOPHEN 325 MG PO TABS
650.0000 mg | ORAL_TABLET | Freq: Four times a day (QID) | ORAL | Status: DC | PRN
  Administered 2011-09-11 – 2011-09-12 (×2): 650 mg via ORAL
  Filled 2011-09-11 (×2): qty 2

## 2011-09-11 MED ORDER — VANCOMYCIN HCL IN DEXTROSE 1-5 GM/200ML-% IV SOLN
1000.0000 mg | Freq: Once | INTRAVENOUS | Status: AC
  Administered 2011-09-11: 1000 mg via INTRAVENOUS
  Filled 2011-09-11: qty 200

## 2011-09-11 MED ORDER — HYDROMORPHONE HCL PF 2 MG/ML IJ SOLN
2.0000 mg | Freq: Once | INTRAMUSCULAR | Status: DC
  Filled 2011-09-11 (×2): qty 1

## 2011-09-11 MED ORDER — HYDROMORPHONE HCL PF 1 MG/ML IJ SOLN: 1.0000 mg | INTRAMUSCULAR | Status: DC | PRN

## 2011-09-11 MED ORDER — SODIUM CHLORIDE 0.9 % IV BOLUS (SEPSIS)
1000.0000 mL | Freq: Once | INTRAVENOUS | Status: AC
  Administered 2011-09-11: 1000 mL via INTRAVENOUS

## 2011-09-11 MED ORDER — OXYCODONE HCL 40 MG PO TB12
80.0000 mg | ORAL_TABLET | Freq: Two times a day (BID) | ORAL | Status: DC
  Administered 2011-09-11 – 2011-09-18 (×15): 80 mg via ORAL
  Filled 2011-09-11: qty 4
  Filled 2011-09-11: qty 1
  Filled 2011-09-11 (×2): qty 2
  Filled 2011-09-11: qty 1
  Filled 2011-09-11: qty 4
  Filled 2011-09-11 (×4): qty 2
  Filled 2011-09-11: qty 1
  Filled 2011-09-11 (×2): qty 2
  Filled 2011-09-11: qty 4
  Filled 2011-09-11 (×2): qty 2
  Filled 2011-09-11: qty 4

## 2011-09-11 MED ORDER — KETOROLAC TROMETHAMINE 30 MG/ML IJ SOLN
15.0000 mg | Freq: Once | INTRAMUSCULAR | Status: AC
  Administered 2011-09-11: 15 mg via INTRAVENOUS
  Filled 2011-09-11: qty 1

## 2011-09-11 MED ORDER — ADULT MULTIVITAMIN W/MINERALS CH
1.0000 | ORAL_TABLET | Freq: Every day | ORAL | Status: DC
  Filled 2011-09-11: qty 1

## 2011-09-11 MED ORDER — METHYLPREDNISOLONE SODIUM SUCC 125 MG IJ SOLR
80.0000 mg | Freq: Once | INTRAMUSCULAR | Status: AC
  Administered 2011-09-11: 80 mg via INTRAVENOUS
  Filled 2011-09-11: qty 2

## 2011-09-11 MED ORDER — KETOROLAC TROMETHAMINE 30 MG/ML IJ SOLN
30.0000 mg | Freq: Once | INTRAMUSCULAR | Status: AC
Start: 2011-09-11 — End: 2011-09-11
  Administered 2011-09-11: 30 mg via INTRAVENOUS
  Filled 2011-09-11: qty 1

## 2011-09-11 MED ORDER — MUPIROCIN 2 % EX OINT
1.0000 "application " | TOPICAL_OINTMENT | Freq: Two times a day (BID) | CUTANEOUS | Status: AC
  Administered 2011-09-11 – 2011-09-16 (×9): 1 via NASAL
  Filled 2011-09-11 (×2): qty 22

## 2011-09-11 MED ORDER — SODIUM CHLORIDE 0.9 % IV SOLN
INTRAVENOUS | Status: DC
  Administered 2011-09-11 – 2011-09-22 (×13): via INTRAVENOUS
  Administered 2011-09-22: 125 mL/h via INTRAVENOUS
  Administered 2011-09-23: 22:00:00 via INTRAVENOUS

## 2011-09-11 MED ORDER — DIPHENHYDRAMINE HCL 50 MG/ML IJ SOLN
INTRAMUSCULAR | Status: AC
  Administered 2011-09-11: 50 mg via INTRAVENOUS
  Filled 2011-09-11: qty 1

## 2011-09-11 MED ORDER — DIPHENHYDRAMINE HCL 50 MG/ML IJ SOLN
25.0000 mg | Freq: Once | INTRAMUSCULAR | Status: AC
  Administered 2011-09-11: 25 mg via INTRAVENOUS
  Filled 2011-09-11: qty 1

## 2011-09-11 MED ORDER — FAMOTIDINE IN NACL 20-0.9 MG/50ML-% IV SOLN
20.0000 mg | Freq: Two times a day (BID) | INTRAVENOUS | Status: DC
  Administered 2011-09-11 – 2011-09-13 (×4): 20 mg via INTRAVENOUS
  Filled 2011-09-11 (×4): qty 50

## 2011-09-11 MED ORDER — SODIUM CHLORIDE 0.9 % IJ SOLN
3.0000 mL | Freq: Two times a day (BID) | INTRAMUSCULAR | Status: DC
  Administered 2011-09-11 – 2011-09-21 (×12): 3 mL via INTRAVENOUS
  Administered 2011-09-21: 10 mL via INTRAVENOUS
  Administered 2011-09-22 – 2011-09-24 (×4): 3 mL via INTRAVENOUS

## 2011-09-11 MED ORDER — DEXTROSE 5 % IV SOLN
1.0000 g | Freq: Two times a day (BID) | INTRAVENOUS | Status: DC
  Administered 2011-09-11 – 2011-09-12 (×2): 1 g via INTRAVENOUS
  Filled 2011-09-11 (×2): qty 1

## 2011-09-11 MED ORDER — KETOROLAC TROMETHAMINE 30 MG/ML IJ SOLN
30.0000 mg | Freq: Once | INTRAMUSCULAR | Status: AC
  Administered 2011-09-11: 30 mg via INTRAVENOUS
  Filled 2011-09-11: qty 1

## 2011-09-11 MED ORDER — DIPHENHYDRAMINE HCL 25 MG PO CAPS
25.0000 mg | ORAL_CAPSULE | ORAL | Status: DC | PRN
  Administered 2011-09-11 – 2011-09-12 (×4): 25 mg via ORAL
  Filled 2011-09-11 (×5): qty 1

## 2011-09-11 MED ORDER — LEVOFLOXACIN IN D5W 750 MG/150ML IV SOLN
750.0000 mg | INTRAVENOUS | Status: DC
  Administered 2011-09-11 – 2011-09-12 (×2): 750 mg via INTRAVENOUS
  Filled 2011-09-11 (×3): qty 150

## 2011-09-11 MED ORDER — FENTANYL CITRATE 0.05 MG/ML IJ SOLN
25.0000 ug | INTRAMUSCULAR | Status: DC | PRN
  Administered 2011-09-11 (×2): 50 ug via INTRAVENOUS
  Administered 2011-09-12: 100 ug via INTRAVENOUS
  Administered 2011-09-12: 50 ug via INTRAVENOUS
  Administered 2011-09-12 – 2011-09-13 (×15): 100 ug via INTRAVENOUS
  Filled 2011-09-11 (×21): qty 2

## 2011-09-11 MED ORDER — HYDROXYUREA 500 MG PO CAPS
1000.0000 mg | ORAL_CAPSULE | Freq: Every day | ORAL | Status: DC
  Administered 2011-09-11 – 2011-09-24 (×14): 1000 mg via ORAL
  Filled 2011-09-11 (×14): qty 2

## 2011-09-11 MED ORDER — BIOTENE DRY MOUTH MT LIQD
15.0000 mL | Freq: Two times a day (BID) | OROMUCOSAL | Status: DC
  Administered 2011-09-11 – 2011-09-24 (×22): 15 mL via OROMUCOSAL

## 2011-09-11 MED ORDER — CHLORHEXIDINE GLUCONATE CLOTH 2 % EX PADS
6.0000 | MEDICATED_PAD | Freq: Every day | CUTANEOUS | Status: AC
  Administered 2011-09-12 – 2011-09-16 (×5): 6 via TOPICAL

## 2011-09-11 MED ORDER — HYDROMORPHONE HCL PF 1 MG/ML IJ SOLN
1.0000 mg | INTRAMUSCULAR | Status: DC | PRN
  Administered 2011-09-11 (×2): 2 mg via INTRAVENOUS
  Filled 2011-09-11: qty 1
  Filled 2011-09-11: qty 2

## 2011-09-11 MED ORDER — FOLIC ACID 1 MG PO TABS: 1.0000 mg | ORAL_TABLET | Freq: Every day | ORAL | Status: DC

## 2011-09-11 NOTE — Consult Note (Signed)
PULMONARY/CCM CONSULT NOTE  Requesting MD/Service: [facility] Date of admission: 6/4 Date of consult: 6/4 Reason for consultation: respiratory acidosis   Pt Profile: Admitted by [facility] through ED with SS pain crisis. Suffered transient lethargy and mild resp acidosis. PCCM asked to eval for same  Patient Active Problem List  Diagnoses  . Sickle cell anemia with crisis  . Pneumonia  . Pruritus  . Acute chest syndrome due to sickle cell crisis  . Acute respiratory failure with hypercapnia  . Leukocytosis    HPI: Very pleasant 31 yobm with hx of SS dz who is chronically on large doses of maintenance opioids presented to ED on day of admission with 2 days of markedly increased diffuse joint pain uncontrolled by oral analgesics. He was seen and evaluated by ED MD and received IV dilaudid. [facility] asked to admit patient. Upon Dr Ashley Royalty' eval, she noted lethargy. An ABG revealed mild resp acidosis. Therefore, PCCM is consulted. Also noted to be febrile with leukocytosis.    Past Medical History  Diagnosis Date  . Sickle cell anemia     MEDICATIONS:   Current Outpatient Prescriptions on File Prior to Encounter  Medication Sig Dispense Refill  . Cyanocobalamin (VITAMIN B 12 PO) Take 1 tablet by mouth.      . folic acid (FOLVITE) 1 MG tablet Take 1 mg by mouth daily.        . hydroxyurea (HYDREA) 500 MG capsule Take 1,000 mg by mouth daily. May take with food to minimize GI side effects.      . Multiple Vitamin (MULITIVITAMIN WITH MINERALS) TABS Take 1 tablet by mouth daily.      Marland Kitchen oxyCODONE (OXYCONTIN) 80 MG 12 hr tablet Take 1 tablet (80 mg total) by mouth every 12 (twelve) hours.  60 tablet  0  . oxycodone (ROXICODONE) 30 MG immediate release tablet 1 - 2 tabs every 6 hours prn sickle cell crisis pain  120 tablet  0    History   Social History  . Marital Status: Single    Spouse Name: N/A    Number of Children: N/A  . Years of Education: N/A   Occupational History  . Not on  file.   Social History Main Topics  . Smoking status: Never Smoker   . Smokeless tobacco: Never Used  . Alcohol Use: Yes     occassionally  . Drug Use: No  . Sexually Active:    Other Topics Concern  . Not on file   Social History Narrative  . No narrative on file    Family History  Problem Relation Age of Onset  . Sickle cell trait      ROS - No respiratory symptoms. No N/V/D/dysuria. ROS negative except pain as per HPI  Filed Vitals:   09/11/11 1700 09/11/11 1900 09/11/11 1934 09/11/11 2000  BP: 127/73 136/60    Pulse: 92 123    Temp:   102.9 F (39.4 C) 102.9 F (39.4 C)  TempSrc:   Oral Oral  Resp: 12 23    Height:      Weight:      SpO2: 98% 91%      EXAM:  Gen: WDWN, pleasant, RASS 0, cognition intact, affect appropriate HEENT: WNL Neck: No JVD Lungs:  Clear to ausc and perc Cardiovascular: RRR s M Abdomen: NABS, soft, NT Musculoskeletal: No edema Neuro: No focal deficits  DATA:  BMET    Component Value Date/Time   NA 132* 09/11/2011 0920   K 4.2  09/11/2011 0920   CL 102 09/11/2011 0920   CO2 21 09/11/2011 0920   GLUCOSE 146* 09/11/2011 0920   BUN 10 09/11/2011 0920   CREATININE 1.08 09/11/2011 0920   CALCIUM 8.2* 09/11/2011 0920   GFRNONAA 89* 09/11/2011 0920   GFRAA >90 09/11/2011 0920    CBC    Component Value Date/Time   WBC 17.6* 09/11/2011 0925   WBC 8.8 07/19/2011 1357   WBC 9.8 10/23/2007 1512   RBC 3.01* 09/11/2011 0925   RBC 4.40 10/23/2007 1512   HGB 9.3* 09/11/2011 0925   HGB 12.2* 07/19/2011 1357   HGB 10.6* 10/23/2007 1512   HCT 24.9* 09/11/2011 0925   HCT 32.9* 07/19/2011 1357   HCT 32.6* 10/23/2007 1512   PLT 211 09/11/2011 0925   PLT 328 07/19/2011 1357   PLT 398 Large & giant platelets 10/23/2007 1512   MCV 82.7 09/11/2011 0925   MCV 85 07/19/2011 1357   MCV 74.0* 10/23/2007 1512   MCH 30.9 09/11/2011 0925   MCH 31.4 07/19/2011 1357   MCH 24.0* 10/23/2007 1512   MCHC 37.3* 09/11/2011 0925   MCHC 37.1* 07/19/2011 1357   MCHC 32.5 10/23/2007 1512   RDW  22.6* 09/11/2011 0925   RDW 21.3* 07/19/2011 1357   RDW 21.4* 10/23/2007 1512   LYMPHSABS 3.2 09/11/2011 0925   LYMPHSABS 2.7 05/23/2011 1338   LYMPHSABS 3.2 10/23/2007 1512   MONOABS 2.8* 09/11/2011 0925   MONOABS 1.1* 10/23/2007 1512   EOSABS 0.0 09/11/2011 0925   EOSABS 0.1 05/23/2011 1338   EOSABS 0.1 10/23/2007 1512   BASOSABS 0.5* 09/11/2011 0925   BASOSABS 0.0 05/23/2011 1338   BASOSABS 0.2* 10/23/2007 1512    CXR: NAD  IMPRESSION:   1) Mild respiratory acidosis - likely precipitated by opioids. Clinically asymptomatic 2) Transient lethargy - resolved 3) Fever, leukocytosis - no definite source of fever 4) SS pain crisis - inadequately controlled @ time of this eval. Pt has very high opioid tolerance due to chronic use  PLAN:  1) I have changed BiPAP to PRN 2) No need to follow ABGs - would monitor clinically. As long as no overt resp,distress SpO2 is OK and cognition is intact he should be OK 3) Agree with monitoring in ICU/SDU initially 4) I have increased PRN dilaudid dose range and decreased dosing interval. I offered him a PCA but he does not believe that works as well 5) Abx per primary team - cultures have been drawn 6) Supplemental O2 to maintain SpO2 > 95% to minimize sickling 7) Dr Myna Hidalgo has been informed of his admission    Billy Fischer, MD;  PCCM service; Mobile [phone]

## 2011-09-11 NOTE — H&P (Signed)
Hospital Admission Note Date: 09/11/2011  Patient name: [name] Medical record number: [id number] Date of birth: 04-22-1980 Age: 31 y.o. Gender: male PCP: Billee Cashing, MD, MD Hematologist: Arlan Organ, MD  Attending physician: Dayton Bailiff, MD   Chief Complaint:Difficulty breathing, chest pain, back/shoulder and knee pain.   History of Present Illness:Patient is well-known to me from previous hospitalizations in the past. He is a 31 year old gentleman who has a history of hemoglobin SS disease and a history acute chest in 2005 requiring intubation. He presented to the emergency room yesterday with complaints of pain and fever. The patient was transferred to the sickle cell center yesterday where he remained overnight. This morning he was sent back to the emergency room for worsening clinical condition. The patient states that he has characteristic sickle cell pain in the area of the right shoulder low back and the left knee. However he additionally has chest pain which she describes as being in the middle of the chest and pleuritic in nature in that it is worse when he tries to take a deep breath. An ABG done in the emergency room shows a hypercarbia with respiratory acidosis. He is also had a fever with a MAXIMUM TEMPERATURE of 101.9. His chest x-ray from yesterday showed no focal airspace opacities however the chest x-ray from today showed decreased lung volumes with bibasilar opacities which probably reflect atelectasis.   He denies any headaches, photophobia, loss of consciousness, dizziness. The patient states that he is feeling "fuzzy" in his orientation.   We are asked to see the patient for further evaluation and management.   Scheduled Meds:   . diphenhydrAMINE  25 mg Intravenous Once  . gi cocktail  30 mL Oral Once  .  HYDROmorphone (DILAUDID) injection  1 mg Intravenous Once  .  HYDROmorphone (DILAUDID) injection  1 mg Intravenous Once  .  HYDROmorphone (DILAUDID)  injection  2 mg Intravenous Once  . ketorolac  30 mg Intravenous Once  . ketorolac  30 mg Intravenous Once  . ondansetron  4 mg Intravenous Once  . piperacillin-tazobactam  3.375 g Intravenous Once  . sodium chloride  1,000 mL Intravenous Once  . vancomycin  1,000 mg Intravenous Once  . DISCONTD: piperacillin-tazobactam (ZOSYN)  IV  3.375 g Intravenous Once   Continuous Infusions:  PRN Meds:. Allergies: Morphine and related; Dilaudid; and Promethazine hcl Past Medical History  Diagnosis Date  . Sickle cell anemia    Past Surgical History  Procedure Date  . Cholecystectomy    Family History  Problem Relation Age of Onset  . Sickle cell trait     History   Social History  . Marital Status: Single    Spouse Name: N/A    Number of Children: N/A  . Years of Education: N/A   Occupational History  . Not on file.   Social History Main Topics  . Smoking status: Never Smoker   . Smokeless tobacco: Never Used  . Alcohol Use: Yes     occassionally  . Drug Use: No  . Sexually Active:    Other Topics Concern  . Not on file   Social History Narrative  . No narrative on file   Review of Systems: A comprehensive review of systems was negative except as noted in history of present illness.  Physical Exam:  Intake/Output Summary (Last 24 hours) at 09/11/11 1333 Last data filed at 09/11/11 0731  Gross per 24 hour  Intake   1000 ml  Output  0 ml  Net   1000 ml   General: Alert, awake, oriented x3. Alert, awake, oriented x 3. He appears in distress secondary to pain and is ill-appearing. This patient is very well known to me from previous hospitalization he appears more ill than has been seen on any hospitalization or before.  HEENT: [place]/AT PEERL, EOMI Neck: Trachea midline,  no masses, no thyromegal,y no JVD, no carotid bruit OROPHARYNX:  Mucosa tacky, No exudate/ erythema/lesions.  Heart: Regular rate and rhythm but tachycardic, without murmurs, rubs, gallops, PMI  non-displaced, no heaves or thrills on palpation.  Lungs: Poor air entry and very little chest movement with inspiration. Abdomen: Soft, nontender, nondistended, positive bowel sounds, no masses no hepatosplenomegaly noted..  Neuro: No focal neurological deficits noted . Musculoskeletal: Patient has tenderness in right shoulder left knee and low back. However there no erythema or joint effusions noted..  Lab results:  Basename 09/11/11 0920  NA 132*  K 4.2  CL 102  CO2 21  GLUCOSE 146*  BUN 10  CREATININE 1.08  CALCIUM 8.2*  MG --  PHOS --   No results found for this basename: AST:2,ALT:2,ALKPHOS:2,BILITOT:2,PROT:2,ALBUMIN:2 in the last 72 hours No results found for this basename: LIPASE:2,AMYLASE:2 in the last 72 hours  Basename 09/11/11 0925 09/10/11 2120  WBC 17.6* 20.8*  NEUTROABS 11.1* 13.1*  HGB 9.3* 12.4*  HCT 24.9* 34.6*  MCV 82.7 89.4  PLT 211 216    Basename 09/11/11 0920  CKTOTAL 1296*  CKMB 4.5*  CKMBINDEX --  TROPONINI <0.30   No components found with this basename: POCBNP:3 No results found for this basename: DDIMER:2 in the last 72 hours No results found for this basename: HGBA1C:2 in the last 72 hours No results found for this basename: CHOL:2,HDL:2,LDLCALC:2,TRIG:2,CHOLHDL:2,LDLDIRECT:2 in the last 72 hours No results found for this basename: TSH,T4TOTAL,FREET3,T3FREE,THYROIDAB in the last 72 hours  Basename 09/11/11 0925 09/10/11 2120  VITAMINB12 -- --  FOLATE -- --  FERRITIN -- --  TIBC -- --  IRON -- --  RETICCTPCT 25.0* 26.4*   Imaging results:  Dg Chest 2 View  09/11/2011  *RADIOLOGY REPORT*  Clinical Data: Sickle cell, diffuse chest pain  CHEST - 2 VIEW  Comparison: 09/10/2011; 04/19/2011; 04/17/2011  Findings: Grossly unchanged borderline enlarged cardiac silhouette given slightly decreased lung volumes. Minimal bibasilar heterogeneous opacities, favored to represent atelectasis.  There is mild elevation of the right hemidiaphragm.  No  pleural effusion or pneumothorax.  Post cholecystectomy.  Unchanged stigmata of sickle cell disease affecting multiple thoracolumbar vertebral bodies.  Increased sclerosis of the bilateral humeral head, grossly unchanged.  Post cholecystectomy.  IMPRESSION: Decreased lung volumes with bibasilar opacities, left greater than right, possibly atelectasis.  Original Report Authenticated By: Waynard Reeds, M.D.   Dg Chest 2 View  09/10/2011  *RADIOLOGY REPORT*  Clinical Data: Sickle cell crisis.  CHEST - 2 VIEW  Comparison: 04/19/2011  Findings: AP and lateral view the chest shows a stable enlargement the cardiopericardial silhouette with vascular congestion.  There is some interstitial coarsening without overt edema.  No pleural effusion. Telemetry leads overlie the chest.  IMPRESSION: No focal airspace consolidation.  Original Report Authenticated By: ERIC A. MANSELL, M.D.   Other results:    Patient Active Hospital Problem List: No active hospital problems.   1. Patient with acute vaso-occlusive episode secondary to hemoglobin SS: Patient appears to have been over sedated with pain medications in antihistamines. He is now having CO2 retention. I asked to see the patient in  consultation. The patient will be admitted to step down unit for close observation. Intensivist and the patient will need ongoing pain medication and I will treat him with bolus doses of morphine. The patient is noted to have an allergy to the narcotics however his allergies patient which is ameliorated with Benadryl.  2. Acute hypercarbic respiratory failure: The patient is having poor ventilation and may benefit from BiPAP. I will reassess the patient and if his mentation is still diminished and we will apply BiPAP.  3. Acute pain : We will treat the patient with intermittent doses of narcotics. Institute IV hydration. Her  4. Fever: The patient has had a fever greater than 24 hours and was started on Zosyn and vancomycin. The  patient has no obvious source. I will discontinue vancomycin in place the patient on cefepime. Will await culture  Melanni Benway A. 09/11/2011, 1:33 PM

## 2011-09-11 NOTE — Progress Notes (Signed)
Upon initial assessment, Patient reports continued pain 10/10. Pain unrelieved by medications throughout the night. VS 99.4-111-16 130/52 71% on RA. O2 applied and deep breathing done, 94% on 5 lIters. Patient states unable to breathe deeply due to pain. NP Hunter paged and notified, noted of previous labs and elevated temp. Orders to admit patient to ED. Hunter NP reported  Dr. Brooke Dare accepting care in ED. ED charge RN notified. To be transferred to ED Room 25 and give report to RN at bedside.

## 2011-09-11 NOTE — Progress Notes (Signed)
Patient ID: [name], male   DOB: July 06, 1980, 31 y.o.   MRN: [id number] Pt found to have low O2 sats on room air, only 80% on RA. 92% on 4 L [place]. BP 111/50, HR 111, T 99.0, RR 16.   PT c/o severe pain generalized 10/10, dilaudid ineffective. Denies any shortness of breath.   Charge Nurse contacted NP Annice Pih and recieved orders to transfer back to the ER.  Bed obtained, pt transferred back to ER with o2 4 L [place] in place.  Report to [place] RN

## 2011-09-11 NOTE — Progress Notes (Signed)
eLink Physician-Brief Progress Note Patient Name: [name] DOB: 02/11/1981 MRN: [id number]  Date of Service  09/11/2011   HPI/Events of Note   Pt with severe allergic reaction to dilaudid.  Has hx of allergy to morphine and dilaudid  eICU Interventions  D/c dilaudid Start medrol/ H2 blocker/ atarax Use fentanyl for pain    Intervention Category Major Interventions: Other:  Shan Levans 09/11/2011, 9:31 PM

## 2011-09-11 NOTE — ED Provider Notes (Signed)
History     CSN: [id number]  Arrival date & time 09/11/11  4098   First MD Initiated Contact with Patient 09/11/11 [phone]      Chief Complaint  Patient presents with  . low o2 sats   . Fever  . Sickle Cell Pain Crisis    (Consider location/radiation/quality/duration/timing/severity/associated sxs/prior treatment) HPI Comments: She was transferred to the sickle cell unit. He developed fever and decreasing oxygen saturations. Transferred back to emergency department for further evaluation and treatment  Patient is a 31 y.o. male presenting with sickle cell pain. The history is provided by the patient. No language interpreter was used.  Sickle Cell Pain Crisis  This is a new problem. The current episode started 2 days ago. The onset was gradual. The problem occurs continuously. The problem has been gradually worsening. The pain is associated with an unknown factor. Pain location: shoulders, chest, back. Site of pain is localized in bone and muscle. The pain is different from prior episodes. The pain is severe. The symptoms are relieved by nothing. The symptoms are not relieved by one or more prescription drugs. The symptoms are aggravated by movement and activity. Associated symptoms include chest pain, back pain and cough. Pertinent negatives include no abdominal pain, no constipation, no diarrhea, no nausea, no vomiting, no dysuria, no congestion, no headaches, no rhinorrhea, no sore throat, no neck pain and no weakness. The cough has no precipitants. The cough is dry. He sickle cell type is SS. There is a history of acute chest syndrome.    Past Medical History  Diagnosis Date  . Sickle cell anemia     Past Surgical History  Procedure Date  . Cholecystectomy     Family History  Problem Relation Age of Onset  . Sickle cell trait      History  Substance Use Topics  . Smoking status: Never Smoker   . Smokeless tobacco: Not on file  . Alcohol Use: Yes     occassionally       Review of Systems  Constitutional: Negative for fever, chills, activity change, appetite change and fatigue.  HENT: Negative for congestion, sore throat, rhinorrhea, neck pain and neck stiffness.   Respiratory: Positive for cough and shortness of breath.   Cardiovascular: Positive for chest pain. Negative for palpitations.  Gastrointestinal: Negative for nausea, vomiting, abdominal pain, diarrhea and constipation.  Genitourinary: Negative for dysuria, urgency, frequency and flank pain.  Musculoskeletal: Positive for myalgias, back pain and arthralgias.  Neurological: Negative for dizziness, weakness, light-headedness, numbness and headaches.  All other systems reviewed and are negative.    Allergies  Morphine and related; Dilaudid; and Promethazine hcl  Home Medications   Current Outpatient Rx  Name Route Sig Dispense Refill  . BISMUTH SUBSALICYLATE 262 MG/15ML PO SUSP Oral Take 15 mLs by mouth every 6 (six) hours as needed. Stomach upset    . VITAMIN B 12 PO Oral Take 1 tablet by mouth.    . FOLIC ACID 1 MG PO TABS Oral Take 1 mg by mouth daily.      Marland Kitchen HYDROXYUREA 500 MG PO CAPS Oral Take 1,000 mg by mouth daily. May take with food to minimize GI side effects.    . ADULT MULTIVITAMIN W/MINERALS CH Oral Take 1 tablet by mouth daily.    . OXYCODONE HCL ER 80 MG PO TB12 Oral Take 1 tablet (80 mg total) by mouth every 12 (twelve) hours. 60 tablet 0  . OXYCODONE HCL 30 MG PO TABS  1 -  2 tabs every 6 hours prn sickle cell crisis pain 120 tablet 0    BP 131/80  Pulse 106  Temp 99 F (37.2 C)  Resp 16  SpO2 92%  Physical Exam  Nursing note and vitals reviewed. Constitutional: He is oriented to person, place, and time. He appears well-developed and well-nourished. No distress.  HENT:  Head: Normocephalic and atraumatic.  Mouth/Throat: Oropharynx is clear and moist. No oropharyngeal exudate.  Eyes: Conjunctivae and EOM are normal. Pupils are equal, round, and reactive to  light.  Neck: Normal range of motion. Neck supple.  Cardiovascular: Regular rhythm, normal heart sounds and intact distal pulses.  Exam reveals no gallop and no friction rub.   No murmur heard.      Tachycardic rate  Pulmonary/Chest: Effort normal and breath sounds normal. No respiratory distress. He has no wheezes. He has no rales. He exhibits tenderness.       Diffusely diminished  Abdominal: Soft. Bowel sounds are normal. There is no tenderness. There is no rebound and no guarding.  Musculoskeletal: Normal range of motion. He exhibits no edema and no tenderness.  Neurological: He is alert and oriented to person, place, and time. No cranial nerve deficit.  Skin: Skin is warm and dry.    ED Course  Procedures (including critical care time)  CRITICAL CARE Performed by: Dayton Bailiff   Total critical care time: 45 min  Critical care time was exclusive of separately billable procedures and treating other patients.  Critical care was necessary to treat or prevent imminent or life-threatening deterioration.  Critical care was time spent personally by me on the following activities: development of treatment plan with patient and/or surrogate as well as nursing, discussions with consultants, evaluation of patient's response to treatment, examination of patient, obtaining history from patient or surrogate, ordering and performing treatments and interventions, ordering and review of laboratory studies, ordering and review of radiographic studies, pulse oximetry and re-evaluation of patient's condition.   Date: 09/11/2011  Rate: 101  Rhythm: sinus tachycardia  QRS Axis: normal  Intervals: normal  ST/T Wave abnormalities: normal  Conduction Disutrbances:none  Narrative Interpretation:   Old EKG Reviewed: unchanged  Labs Reviewed  BASIC METABOLIC PANEL - Abnormal; Notable for the following:    Sodium 132 (*)    Glucose, Bld 146 (*)    Calcium 8.2 (*)    GFR calc non Af Amer 89 (*)     All other components within normal limits  URINALYSIS, ROUTINE W REFLEX MICROSCOPIC - Abnormal; Notable for the following:    Color, Urine AMBER (*) BIOCHEMICALS MAY BE AFFECTED BY COLOR   Hgb urine dipstick SMALL (*)    All other components within normal limits  CARDIAC PANEL(CRET KIN+CKTOT+MB+TROPI) - Abnormal; Notable for the following:    Total CK 1296 (*)    CK, MB 4.5 (*)    All other components within normal limits  BLOOD GAS, ARTERIAL - Abnormal; Notable for the following:    pH, Arterial 7.306 (*)    pCO2 arterial 49.4 (*)    All other components within normal limits  CBC - Abnormal; Notable for the following:    WBC 17.6 (*)    RBC 3.01 (*)    Hemoglobin 9.3 (*) DELTA CHECK NOTED   HCT 24.9 (*)    MCHC 37.3 (*)    RDW 22.6 (*)    All other components within normal limits  DIFFERENTIAL - Abnormal; Notable for the following:    Monocytes Relative 16 (*)  Basophils Relative 3 (*)    Neutro Abs 11.1 (*)    Monocytes Absolute 2.8 (*)    Basophils Absolute 0.5 (*)    All other components within normal limits  RETICULOCYTES - Abnormal; Notable for the following:    RBC. 3.01 (*)    All other components within normal limits  URINE MICROSCOPIC-ADD ON - Abnormal; Notable for the following:    Bacteria, UA FEW (*)    All other components within normal limits  LACTIC ACID, PLASMA  CULTURE, BLOOD (ROUTINE X 2)  CULTURE, BLOOD (ROUTINE X 2)   Dg Chest 2 View  09/11/2011  *RADIOLOGY REPORT*  Clinical Data: Sickle cell, diffuse chest pain  CHEST - 2 VIEW  Comparison: 09/10/2011; 04/19/2011; 04/17/2011  Findings: Grossly unchanged borderline enlarged cardiac silhouette given slightly decreased lung volumes. Minimal bibasilar heterogeneous opacities, favored to represent atelectasis.  There is mild elevation of the right hemidiaphragm.  No pleural effusion or pneumothorax.  Post cholecystectomy.  Unchanged stigmata of sickle cell disease affecting multiple thoracolumbar vertebral  bodies.  Increased sclerosis of the bilateral humeral head, grossly unchanged.  Post cholecystectomy.  IMPRESSION: Decreased lung volumes with bibasilar opacities, left greater than right, possibly atelectasis.  Original Report Authenticated By: Waynard Reeds, M.D.   Dg Chest 2 View  09/10/2011  *RADIOLOGY REPORT*  Clinical Data: Sickle cell crisis.  CHEST - 2 VIEW  Comparison: 04/19/2011  Findings: AP and lateral view the chest shows a stable enlargement the cardiopericardial silhouette with vascular congestion.  There is some interstitial coarsening without overt edema.  No pleural effusion. Telemetry leads overlie the chest.  IMPRESSION: No focal airspace consolidation.  Original Report Authenticated By: ERIC A. MANSELL, M.D.     1. Sickle cell anemia with crisis   2. Acute chest syndrome       MDM  SCC with acute chest syndrome. Febrile and with oxygen requirement. Had a change in his chest x-ray from initial visit. Placed on vancomycin and Zosyn. IV fluids, pain control. Toradol was given for anti-paresis. Discussed with the hospitalist who felt the patient was too ill to be managed by the hospitalist service. Discussed with critical care who will evaluate the patient. When discussed with the hospitalist service they felt the patient was too acidotic to be managed safely in the step down unit. I explained that his pH was 7.3.        Dayton Bailiff, MD 09/11/11 1256

## 2011-09-11 NOTE — ED Notes (Signed)
Pt brought by wheelchair from sickle cell pain clinic. Per report from [place], RN pt was found to have sat of 71%on ra. Was placed on 02 4liters, sats up to 92%. Pt also has fever was 101.9, treated with tylenol. Fever down to 99.0. Pt has had dilaudid 2 mg every 2 hours throughout the night. Pt is alert, drowsy, reports pain from sickle crisis all over. Denies abdominal pain or chest pain that is different from usual Sickle pain.

## 2011-09-11 NOTE — ED Notes (Signed)
ZOX:[id number]<AV> Expected date:09/11/11<BR> Expected time: 7:58 AM<BR> Means of arrival:Other<BR> Comments:<BR> Sickle cell clinic

## 2011-09-12 ENCOUNTER — Inpatient Hospital Stay (HOSPITAL_COMMUNITY): Payer: Medicare Other

## 2011-09-12 DIAGNOSIS — R509 Fever, unspecified: Secondary | ICD-10-CM

## 2011-09-12 DIAGNOSIS — L299 Pruritus, unspecified: Secondary | ICD-10-CM

## 2011-09-12 DIAGNOSIS — D7289 Other specified disorders of white blood cells: Secondary | ICD-10-CM

## 2011-09-12 DIAGNOSIS — D5700 Hb-SS disease with crisis, unspecified: Secondary | ICD-10-CM

## 2011-09-12 DIAGNOSIS — J9600 Acute respiratory failure, unspecified whether with hypoxia or hypercapnia: Secondary | ICD-10-CM

## 2011-09-12 DIAGNOSIS — J189 Pneumonia, unspecified organism: Secondary | ICD-10-CM

## 2011-09-12 LAB — DIFFERENTIAL
Basophils Absolute: 0.2 10*3/uL — ABNORMAL HIGH (ref 0.0–0.1)
Eosinophils Absolute: 0 10*3/uL (ref 0.0–0.7)
Lymphocytes Relative: 7 % — ABNORMAL LOW (ref 12–46)
Monocytes Relative: 7 % (ref 3–12)
Neutro Abs: 13.9 10*3/uL — ABNORMAL HIGH (ref 1.7–7.7)

## 2011-09-12 LAB — CBC
HCT: 25.6 % — ABNORMAL LOW (ref 39.0–52.0)
MCHC: 37.9 g/dL — ABNORMAL HIGH (ref 30.0–36.0)
Platelets: 225 10*3/uL (ref 150–400)
RDW: 22.3 % — ABNORMAL HIGH (ref 11.5–15.5)

## 2011-09-12 LAB — COMPREHENSIVE METABOLIC PANEL
ALT: 83 U/L — ABNORMAL HIGH (ref 0–53)
Alkaline Phosphatase: 135 U/L — ABNORMAL HIGH (ref 39–117)
CO2: 23 mEq/L (ref 19–32)
Calcium: 9 mg/dL (ref 8.4–10.5)
GFR calc Af Amer: >90 mL/min (ref >90)
GFR calc non Af Amer: >90 mL/min (ref >90)
Glucose, Bld: 137 mg/dL — ABNORMAL HIGH (ref 70–99)
Potassium: 4.8 mEq/L (ref 3.5–5.1)
Sodium: 134 mEq/L — ABNORMAL LOW (ref 135–145)
Total Bilirubin: 4.4 mg/dL — ABNORMAL HIGH (ref 0.3–1.2)

## 2011-09-12 LAB — BLOOD GAS, ARTERIAL
Acid-base deficit: 1.7 mmol/L (ref 0.0–2.0)
Bicarbonate: 23.8 mEq/L (ref 20.0–24.0)
O2 Saturation: 93.7 %
Patient temperature: 98.6
TCO2: 22.8 mmol/L (ref 0–100)

## 2011-09-12 LAB — PREPARE RBC (CROSSMATCH)

## 2011-09-12 LAB — RETICULOCYTES: Retic Count, Absolute: 865.2 10*3/uL — ABNORMAL HIGH (ref 19.0–186.0)

## 2011-09-12 MED ORDER — NALBUPHINE SYRINGE 5 MG/0.5 ML
5.0000 mg | INJECTION | Freq: Once | INTRAMUSCULAR | Status: AC
  Administered 2011-09-12: 5 mg via INTRAVENOUS
  Filled 2011-09-12: qty 0.5

## 2011-09-12 MED ORDER — DIPHENHYDRAMINE HCL 50 MG/ML IJ SOLN
25.0000 mg | INTRAMUSCULAR | Status: DC | PRN
  Administered 2011-09-12 – 2011-09-24 (×38): 25 mg via INTRAVENOUS
  Filled 2011-09-12 (×36): qty 1

## 2011-09-12 MED ORDER — DEXTROSE 5 % IV SOLN
1.0000 g | Freq: Two times a day (BID) | INTRAVENOUS | Status: DC
  Administered 2011-09-12: 21:00:00 via INTRAVENOUS
  Filled 2011-09-12 (×2): qty 1

## 2011-09-12 MED ORDER — VITAMINS A & D EX OINT
TOPICAL_OINTMENT | CUTANEOUS | Status: AC
  Administered 2011-09-12: 15:00:00
  Filled 2011-09-12: qty 5

## 2011-09-12 MED ORDER — CAMPHOR-MENTHOL 0.5-0.5 % EX LOTN
TOPICAL_LOTION | CUTANEOUS | Status: DC | PRN
  Administered 2011-09-14: 01:00:00 via TOPICAL
  Filled 2011-09-12 (×2): qty 222

## 2011-09-12 MED ORDER — KETOROLAC TROMETHAMINE 30 MG/ML IJ SOLN
30.0000 mg | Freq: Two times a day (BID) | INTRAMUSCULAR | Status: AC
  Administered 2011-09-12 – 2011-09-14 (×4): 30 mg via INTRAVENOUS
  Filled 2011-09-12 (×5): qty 1

## 2011-09-12 MED ORDER — ACETAMINOPHEN 325 MG PO TABS
650.0000 mg | ORAL_TABLET | Freq: Once | ORAL | Status: DC
  Filled 2011-09-12: qty 2

## 2011-09-12 MED ORDER — FUROSEMIDE 10 MG/ML IJ SOLN
20.0000 mg | Freq: Once | INTRAMUSCULAR | Status: AC
  Administered 2011-09-12: 20 mg via INTRAVENOUS
  Filled 2011-09-12: qty 2

## 2011-09-12 MED ORDER — DIPHENHYDRAMINE HCL 50 MG/ML IJ SOLN
25.0000 mg | Freq: Once | INTRAMUSCULAR | Status: DC
  Filled 2011-09-12 (×3): qty 1

## 2011-09-12 MED ORDER — DIPHENHYDRAMINE HCL 25 MG PO CAPS
25.0000 mg | ORAL_CAPSULE | ORAL | Status: DC | PRN
  Administered 2011-09-14 – 2011-09-24 (×3): 25 mg via ORAL
  Filled 2011-09-12 (×3): qty 1

## 2011-09-12 NOTE — Progress Notes (Signed)
ANTIBIOTIC CONSULT NOTE - INITIAL  Pharmacy Consult for Cefepime Indication: fever, leukocytosis  Allergies  Allergen Reactions  . Morphine And Related Rash  . Dilaudid (Hydromorphone Hcl)     dilusional only with 4mg  dose. Can't take "too much"  . Promethazine Hcl Hives    Patient Measurements: Height: 5' 5.5" (166.4 cm) Weight: 160 lb 15 oz (73 kg) IBW/kg (Calculated) : 62.65   Vital Signs: Temp: 97.4 F (36.3 C) (06/05 0800) Temp src: Axillary (06/05 0800) BP: 143/67 mmHg (06/05 0400) Pulse Rate: 102  (06/05 0800)  Labs:  Basename 09/12/11 0335 09/11/11 0925 09/11/11 0920 09/10/11 2120  WBC 16.5* 17.6* -- 20.8*  HGB 9.7* 9.3* -- 12.4*  PLT 225 211 -- 216  LABCREA -- -- -- --  CREATININE 1.02 -- 1.08 --   Estimated Creatinine Clearance: 92.2 ml/min (by C-G formula based on Cr of 1.02).  Microbiology: Recent Results (from the past 720 hour(s))  MRSA PCR SCREENING     Status: Abnormal   Collection Time   09/11/11  2:45 PM      Component Value Range Status Comment   MRSA by PCR POSITIVE (*) NEGATIVE  Final     Medical History: Past Medical History  Diagnosis Date  . Sickle cell anemia     Medications:  Anti-infectives     Start     Dose/Rate Route Frequency Ordered Stop   09/11/11 1600   ceFEPIme (MAXIPIME) 1 g in dextrose 5 % 50 mL IVPB  Status:  Discontinued        1 g 100 mL/hr over 30 Minutes Intravenous Every 12 hours 09/11/11 1445 09/12/11 1033   09/11/11 1530   levofloxacin (LEVAQUIN) IVPB 750 mg        750 mg 100 mL/hr over 90 Minutes Intravenous Every 24 hours 09/11/11 1445     09/11/11 0915   vancomycin (VANCOCIN) IVPB 1000 mg/200 mL premix        1,000 mg 200 mL/hr over 60 Minutes Intravenous  Once 09/11/11 0858 09/11/11 1100   09/11/11 0915  piperacillin-tazobactam (ZOSYN) IVPB 3.375 g       3.375 g 100 mL/hr over 30 Minutes Intravenous  Once 09/11/11 0900 09/11/11 1025   09/11/11 0900   piperacillin-tazobactam (ZOSYN) IVPB 3.375 g   Status:  Discontinued        3.375 g 12.5 mL/hr over 240 Minutes Intravenous  Once 09/11/11 0858 09/11/11 0859         Assessment:  31 yo M admit on 6/4 with sickle cell pain crisis  Day #2 Cefepime and Levaquin.  Cefepime initially d/c on 6/5, but resumed per pharmacy dosing for ongoing fevers.  Renal function WNL, CrCl ~ 92 ml/min  Goal of Therapy:  Appropriate doses for renal function  Plan:   Resume Cefepime 1g IV q12h  Follow up renal fxn and culture results.   Lynann Beaver PharmD, BCPS Pager [phone] 09/12/2011 11:35 AM

## 2011-09-12 NOTE — Procedures (Signed)
Central Venous Catheter Insertion Procedure Note [name] [id number] April 02, 1981  Procedure: Insertion of Central Venous Catheter Indications: Drug and/or fluid administration and Frequent blood sampling  Procedure Details Consent: Risks of procedure as well as the alternatives and risks of each were explained to the (patient/caregiver).  Consent for procedure obtained. Time Out: Verified patient identification, verified procedure, site/side was marked, verified correct patient position, special equipment/implants available, medications/allergies/relevent history reviewed, required imaging and test results available.  Performed  Maximum sterile technique was used including antiseptics, cap, gloves, gown, hand hygiene, mask and sheet. Skin prep: Chlorhexidine; local anesthetic administered A antimicrobial bonded/coated triple lumen catheter was placed in the left internal jugular vein using the Seldinger technique. Ultrasound was used to identify the IJ vein and verify placement of the wire prior to insertion of the catheter.  Evaluation Blood flow good Complications: No apparent complications Patient did tolerate procedure well. Chest X-ray ordered to verify placement.  CXR: pending.  [name] 09/12/2011, 7:42 PM

## 2011-09-12 NOTE — Progress Notes (Signed)
Attending MD [facility] team notified that the pts current pain med orders were not sufficient in relieving the pts pain. New orders from MD received. IV team notified of need for PICC placement.

## 2011-09-12 NOTE — Progress Notes (Signed)
Subjective: Still complains of pain in both shoulders, hips, and back of neck. No new issues, otherwise.  Objective: Vital signs in last 24 hours: Temp:  [97.4 F (36.3 C)-102.9 F (39.4 C)] 97.4 F (36.3 C) (06/05 0800) Pulse Rate:  [92-123] 93  (06/05 0400) Resp:  [12-24] 14  (06/05 0400) BP: (114-144)/(60-93) 143/67 mmHg (06/05 0400) SpO2:  [62 %-100 %] 92 % (06/05 0400) Weight:  [73 kg (160 lb 15 oz)] 73 kg (160 lb 15 oz) (06/04 1452) Weight change:  Last BM Date: 09/09/11  Intake/Output from previous day: 06/04 0701 - 06/05 0700 In: 2690 [P.O.:240; I.V.:2200; IV Piggyback:250] Out: 1850 [Urine:1850]     Physical Exam: General: Comfortable, alert, communicative, fully oriented, not short of breath at rest.  HEENT:  Mild clinical pallor, no jaundice, no conjunctival injection or discharge. Hydration is fair. NECK:  Supple, JVP not seen, no carotid bruits, no palpable lymphadenopathy, no palpable goiter. CHEST:  Clinically clear to auscultation, no wheezes, no crackles. HEART:  Sounds 1 and 2 heard, normal, regular, no murmurs. ABDOMEN:  Full, soft, non-tender, no palpable organomegaly, no palpable masses, normal bowel sounds. GENITALIA:  Not examined. LOWER EXTREMITIES:  No pitting edema, palpable peripheral pulses. MUSCULOSKELETAL SYSTEM:  Unremarkable. CENTRAL NERVOUS SYSTEM:  No focal neurologic deficit on gross examination.  Lab Results:  Basename 09/12/11 0335 09/11/11 0925  WBC 16.5* 17.6*  HGB 9.7* 9.3*  HCT 25.6* 24.9*  PLT 225 211    Basename 09/12/11 0335 09/11/11 0920  NA 134* 132*  K 4.8 4.2  CL 103 102  CO2 23 21  GLUCOSE 137* 146*  BUN 14 10  CREATININE 1.02 1.08  CALCIUM 9.0 8.2*   Recent Results (from the past 240 hour(s))  MRSA PCR SCREENING     Status: Abnormal   Collection Time   09/11/11  2:45 PM      Component Value Range Status Comment   MRSA by PCR POSITIVE (*) NEGATIVE  Final      Studies/Results: Dg Chest 2 View  09/11/2011   *RADIOLOGY REPORT*  Clinical Data: Sickle cell, diffuse chest pain  CHEST - 2 VIEW  Comparison: 09/10/2011; 04/19/2011; 04/17/2011  Findings: Grossly unchanged borderline enlarged cardiac silhouette given slightly decreased lung volumes. Minimal bibasilar heterogeneous opacities, favored to represent atelectasis.  There is mild elevation of the right hemidiaphragm.  No pleural effusion or pneumothorax.  Post cholecystectomy.  Unchanged stigmata of sickle cell disease affecting multiple thoracolumbar vertebral bodies.  Increased sclerosis of the bilateral humeral head, grossly unchanged.  Post cholecystectomy.  IMPRESSION: Decreased lung volumes with bibasilar opacities, left greater than right, possibly atelectasis.  Original Report Authenticated By: Waynard Reeds, M.D.   Dg Chest 2 View  09/10/2011  *RADIOLOGY REPORT*  Clinical Data: Sickle cell crisis.  CHEST - 2 VIEW  Comparison: 04/19/2011  Findings: AP and lateral view the chest shows a stable enlargement the cardiopericardial silhouette with vascular congestion.  There is some interstitial coarsening without overt edema.  No pleural effusion. Telemetry leads overlie the chest.  IMPRESSION: No focal airspace consolidation.  Original Report Authenticated By: ERIC A. MANSELL, M.D.    Medications: Scheduled Meds:   . antiseptic oral rinse  15 mL Mouth Rinse BID  . ceFEPime (MAXIPIME) IV  1 g Intravenous Q12H  . Chlorhexidine Gluconate Cloth  6 each Topical Q0600  . diphenhydrAMINE  25 mg Intravenous Once  . diphenhydrAMINE  50 mg Intravenous Once  . enoxaparin  40 mg Subcutaneous Q24H  .  famotidine (PEPCID) IV  20 mg Intravenous Q12H  . folic acid  1 mg Oral Daily  . gi cocktail  30 mL Oral Once  .  HYDROmorphone (DILAUDID) injection  1 mg Intravenous Once  .  HYDROmorphone (DILAUDID) injection  1 mg Intravenous Once  .  HYDROmorphone (DILAUDID) injection  2 mg Intravenous Once  . hydroxyurea  1,000 mg Oral Daily  . ketorolac  30 mg  Intravenous Once  . ketorolac  30 mg Intravenous Once  . levofloxacin (LEVAQUIN) IV  750 mg Intravenous Q24H  . methylPREDNISolone (SOLU-MEDROL) injection  80 mg Intravenous Once  . mulitivitamin with minerals  1 tablet Oral Daily  . mupirocin ointment  1 application Nasal BID  . nalbuphine  5 mg Intravenous Once  . nalbuphine  5 mg Intravenous Once  . ondansetron  4 mg Intravenous Once  . oxyCODONE  80 mg Oral Q12H  . piperacillin-tazobactam  3.375 g Intravenous Once  . sodium chloride  1,000 mL Intravenous Once  . sodium chloride  3 mL Intravenous Q12H  . vancomycin  1,000 mg Intravenous Once  . DISCONTD:  HYDROmorphone (DILAUDID) injection  2 mg Intravenous Once  . DISCONTD: piperacillin-tazobactam (ZOSYN)  IV  3.375 g Intravenous Once   Continuous Infusions:   . sodium chloride 100 mL/hr at 09/12/11 0519   PRN Meds:.acetaminophen, camphor-menthol, diphenhydrAMINE, fentaNYL, hydrOXYzine, DISCONTD:  HYDROmorphone (DILAUDID) injection, DISCONTD:  HYDROmorphone (DILAUDID) injection  Assessment/Plan:  Active Problems:  1. Sickle cell anemia with crisis: Patient presented in acute pain crisis, with pattern of pain, following the usual pattern of his crises. No obvious precipitant. Managing with iv fluids, analgesics, Folate anf Hydroxyurea. Choice of pain medication is proving problematic, sue to patient's allergy and intolerance, to Morphine and Dilaudid. Currently on prn Fentanyl. But overnight , has required Toradol and Nalbuphine. At this time, does not appear to be in acute distress. Will continue current management. Dr Arlan Organ, provided hematology consultation, and feels patient will benefit from total EBT.   2. Acute respiratory failure with hypercapnia/Acute chest syndrome due to sickle cell crisis: Patient was initially admitted to [facility] on 09/10/11, then tranfered to [facility] on 09/11/11, due to worsening clinical status, with Hypercarbia and respiratory acidosis.  Temperature was  101.9, chest x-ray from 09/10/11, showed no focal airspace opacities however the chest x-ray from 09/11/11, showed decreased lung volumes with bibasilar opacities/atelectasis. He also had a significant leukocytosis of 20.8. He was initially managed with Vancomycin/Zosyn, but later changed to Rocehin/Levaquin, now day# 2. Temperature reached a high of 102.9, but patient has since defervesced, wcc appears to have plateaued and Oxygen requirements are less. We shall  Repeat ABG/CXR today.    LOS: 1 day   [name],[name] 09/12/2011, 8:45 AM

## 2011-09-12 NOTE — Progress Notes (Signed)
PULMONARY/CCM CONSULT NOTE  Requesting MD/Service: [facility]/Matthews Date of admission: 6/4 Date of consult: 6/4 Reason for consultation: respiratory acidosis   Pt Profile: Admitted by [facility] through ED with SS pain crisis. Suffered transient lethargy and mild resp acidosis. PCCM asked to eval for same   Filed Vitals:   09/12/11 0213 09/12/11 0400 09/12/11 0401 09/12/11 0800  BP:  143/67    Pulse:  93  102  Temp: 98.6 F (37 C) 97.8 F (36.6 C) 97.8 F (36.6 C) 97.4 F (36.3 C)  TempSrc: Oral Oral Oral Axillary  Resp:  14  17  Height:      Weight:      SpO2:  92%  89%    EXAM:  Gen: WDWN, pleasant, RASS 0, cognition intact, affect appropriate HEENT: WNL Neck: No JVD Lungs:  Clear to ausc and perc Cardiovascular: RRR s M Abdomen: NABS, soft, NT Musculoskeletal: No edema Neuro: No focal deficits  DATA:  BMET    Component Value Date/Time   NA 134* 09/12/2011 0335   K 4.8 09/12/2011 0335   CL 103 09/12/2011 0335   CO2 23 09/12/2011 0335   GLUCOSE 137* 09/12/2011 0335   BUN 14 09/12/2011 0335   CREATININE 1.02 09/12/2011 0335   CALCIUM 9.0 09/12/2011 0335   GFRNONAA >90 09/12/2011 0335   GFRAA >90 09/12/2011 0335    CBC    Component Value Date/Time   WBC 16.5* 09/12/2011 0335   WBC 8.8 07/19/2011 1357   WBC 9.8 10/23/2007 1512   RBC 3.09* 09/12/2011 0335   RBC 4.40 10/23/2007 1512   HGB 9.7* 09/12/2011 0335   HGB 12.2* 07/19/2011 1357   HGB 10.6* 10/23/2007 1512   HCT 25.6* 09/12/2011 0335   HCT 32.9* 07/19/2011 1357   HCT 32.6* 10/23/2007 1512   PLT 225 09/12/2011 0335   PLT 328 07/19/2011 1357   PLT 398 Large & giant platelets 10/23/2007 1512   MCV 82.8 09/12/2011 0335   MCV 85 07/19/2011 1357   MCV 74.0* 10/23/2007 1512   MCH 31.4 09/12/2011 0335   MCH 31.4 07/19/2011 1357   MCH 24.0* 10/23/2007 1512   MCHC 37.9* 09/12/2011 0335   MCHC 37.1* 07/19/2011 1357   MCHC 32.5 10/23/2007 1512   RDW 22.3* 09/12/2011 0335   RDW 21.3* 07/19/2011 1357   RDW 21.4* 10/23/2007 1512   LYMPHSABS 1.2 09/12/2011 0335   LYMPHSABS 2.7 05/23/2011 1338   LYMPHSABS 3.2 10/23/2007 1512   MONOABS 1.2* 09/12/2011 0335   MONOABS 1.1* 10/23/2007 1512   EOSABS 0.0 09/12/2011 0335   EOSABS 0.1 05/23/2011 1338   EOSABS 0.1 10/23/2007 1512   BASOSABS 0.2* 09/12/2011 0335   BASOSABS 0.0 05/23/2011 1338   BASOSABS 0.2* 10/23/2007 1512    CXR: NAD  IMPRESSION:   1) Mild respiratory acidosis - likely precipitated by opioids. Clinically asymptomatic 2) Transient lethargy - resolved 3) Fever, leukocytosis - no definite source of fever 4) SS pain crisis - inadequately controlled @ time of this eval. 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 2) D/C further ABG. 3) Change to SDU status and transfer to [facility] service. 4) I have increased PRN dilaudid dose range and decreased dosing interval. PRN fentanyl. 5) D/C cefepime and continue levaquin - cultures have been drawn and NTD. 6) Supplemental O2 to maintain SpO2 > 95% to minimize sickling 7) Appreciate input from heme, if patient will need exchange transfusion will need to be transferred to [facility].  PCCM signing off, please call back if needed.  Alyson Reedy, M.D. [facility]. Pager: [phone]. After hours pager: [phone].

## 2011-09-12 NOTE — Consult Note (Signed)
I will dictate a full note later.    We now can do complete exchange transfusion in [place].  I will need to figure out how to do this.    I believe this is done at [facility].  Complete exchange will benefit him greatly.  He is having more crises.  Thanks for all of your great help!!!  [name]

## 2011-09-13 DIAGNOSIS — K5900 Constipation, unspecified: Secondary | ICD-10-CM | POA: Diagnosis not present

## 2011-09-13 DIAGNOSIS — D5700 Hb-SS disease with crisis, unspecified: Secondary | ICD-10-CM

## 2011-09-13 DIAGNOSIS — R509 Fever, unspecified: Secondary | ICD-10-CM

## 2011-09-13 DIAGNOSIS — J9600 Acute respiratory failure, unspecified whether with hypoxia or hypercapnia: Secondary | ICD-10-CM

## 2011-09-13 DIAGNOSIS — D7289 Other specified disorders of white blood cells: Secondary | ICD-10-CM

## 2011-09-13 LAB — BASIC METABOLIC PANEL
BUN: 18 mg/dL (ref 6–23)
Creatinine, Ser: 1.12 mg/dL (ref 0.50–1.35)
GFR calc Af Amer: >90 mL/min (ref >90)
GFR calc non Af Amer: 86 mL/min — ABNORMAL LOW (ref >90)
Potassium: 4.8 mEq/L (ref 3.5–5.1)

## 2011-09-13 LAB — CBC
HCT: 24.4 % — ABNORMAL LOW (ref 39.0–52.0)
MCHC: 36.5 g/dL — ABNORMAL HIGH (ref 30.0–36.0)
MCV: 84.1 fL (ref 78.0–100.0)
Platelets: 170 10*3/uL (ref 150–400)
RDW: 21.8 % — ABNORMAL HIGH (ref 11.5–15.5)

## 2011-09-13 MED ORDER — OXYCODONE HCL 5 MG PO TABS: 60.0000 mg | ORAL_TABLET | ORAL | Status: DC | PRN

## 2011-09-13 MED ORDER — POLYETHYLENE GLYCOL 3350 17 G PO PACK
17.0000 g | PACK | Freq: Every day | ORAL | Status: DC
  Administered 2011-09-13 – 2011-09-20 (×5): 17 g via ORAL
  Filled 2011-09-13 (×12): qty 1

## 2011-09-13 MED ORDER — NALBUPHINE HCL 10 MG/ML IJ SOLN
10.0000 mg | INTRAMUSCULAR | Status: DC | PRN
  Administered 2011-09-13 (×3): 10 mg via INTRAVENOUS
  Filled 2011-09-13 (×2): qty 1

## 2011-09-13 MED ORDER — OXYCODONE HCL 5 MG PO TABS: 30.0000 mg | ORAL_TABLET | ORAL | Status: DC | PRN

## 2011-09-13 MED ORDER — FAMOTIDINE 20 MG PO TABS
20.0000 mg | ORAL_TABLET | Freq: Two times a day (BID) | ORAL | Status: DC
  Administered 2011-09-13 – 2011-09-19 (×13): 20 mg via ORAL
  Filled 2011-09-13 (×14): qty 1

## 2011-09-13 MED ORDER — SODIUM CHLORIDE 0.9 % IJ SOLN
INTRAMUSCULAR | Status: AC
  Filled 2011-09-13: qty 3

## 2011-09-13 MED ORDER — LEVOFLOXACIN 500 MG PO TABS
500.0000 mg | ORAL_TABLET | ORAL | Status: DC
  Administered 2011-09-13 – 2011-09-16 (×4): 500 mg via ORAL
  Filled 2011-09-13 (×5): qty 1

## 2011-09-13 MED ORDER — OXYCODONE HCL 5 MG PO TABS
30.0000 mg | ORAL_TABLET | ORAL | Status: DC | PRN
  Administered 2011-09-13: 60 mg via ORAL
  Administered 2011-09-14: 30 mg via ORAL
  Administered 2011-09-14: 60 mg via ORAL
  Administered 2011-09-14: 30 mg via ORAL
  Filled 2011-09-13: qty 12
  Filled 2011-09-13: qty 6
  Filled 2011-09-13: qty 12
  Filled 2011-09-13: qty 6

## 2011-09-13 NOTE — Progress Notes (Signed)
#   [id number] is note.

## 2011-09-13 NOTE — Progress Notes (Addendum)
Pharmacy Note  Mr. Steffler was transferred from ICU/SD to the oncology unit today.  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  Patient currently alert, conversant, cooperative.  He states his pain score is 10 and has been at that level during most of his admission.  He reports receiving relief after the fentanyl injections while in ICU.  Drug Allergies / Intolerances: Morphine - rash Promethazine - hives Dilaudid - delusions after receiving IV  Analgesic regimen prior to admission reported as: Oxycontin (SR) 80mg  PO q12h Oxycodone (IR) 30 - 60mg  PO q6h prn sickle cell crisis pain.  Opioids received during past 24 hours: Oxycodone 80mg  PO x 2 doses (scheduled q12h) Fentanyl IV x 10 doses (was ordered 25-100mg  IV q 1 h prn severe pain; now DCd) Nubain 10mg  IV x 1 dose (ordered as 10mg  IV q4h prn moderate pain)  Pain scores during past 24 hours: 10 at each time recorded while patient awake   VS at 1101am: T 98.9 P 80 R 18 BP 124/70   Comment: Nubain (nalbuphine) is a mixed opioid agonist-antagonist.  Because of its partial antagonist activity at opioid receptors, Nubain can partially reverse the effect of pure opioid agonists.  There are case reports of Nubain-induced opioid withdrawal in opioid-tolerant patients.  Recommend:  Consider discontinuing Nubain due to its partial opioid antagonist effects.  Consider resuming oral oxycodone 30 - 60mg  PO q4h prn breakthrough paiin.  If pain still uncontrolled, consider resuming fentanyl but at a lower dosage than used in ICU/SD (e.g., 25 mcg IV q2h prn uncontrolled pain).  Hope Budds, PharmD, BCPS Pager: [phone] 09/13/2011 12:26 PM

## 2011-09-13 NOTE — Progress Notes (Signed)
Per MD order, blood exchanged done for 570cc of blood removed unintentionally (9-60 cc syringes)  NS 10 cc flushes used (8 in total). Pt tolerated procedure well. RN notified that the exchange is completed.  Consuello Masse

## 2011-09-13 NOTE — Progress Notes (Signed)
Mr. Covelli is well known to me.  He has hemoglobin SS disease.  He is having more crises lately.  He was, I think, hospitalized just a month or so ago.  He is on Hydrea and folic acid.  He has a decent level of hemoglobin F.  He came in with body aches.  He mostly had problems with his shoulder and hips.  He is on OxyContin and oxycodone at home.  He also takes B12 on occasion.  He is on IV fluids right now.  When he came in, he was on the anemic side.  Typically, his hemoglobin runs around 11 or so.  He did have a chest x-ray done when he came in.  Chest x-ray showed no focal airspace disease.  This was repeated on the 4th, which showed some bilateral opacities at the bases, thought to be atelectasis.  He currently is on IV antibiotics with Maxipime.  He is also on Lovenox for DVT prophylaxis.  He does have quite a few allergies to narcotics.  I will go ahead and try him on some Nubain.  This, hopefully, will be well tolerated by him.  We do have an exchange transfusion machine now in [place]. Unfortunately, we cannot use it because of bureaucratic red tape.  We are going to have to do "old fashioned" exchange transfusions.  He had a triple lumen line placed by Critical Care yesterday.  This is in the left internal jugular.  He is feeling a little bit better.  He is on IV fluids.  Today, his hemoglobin is 8.9 with hematocrit 24.4.  Platelet count is 170.  His BUN and creatinine are 18 and 1.12.  When he came in, he did have a retic count of 25%.  This is definite on the high side for him.  Again, exchange transfusions will help him.  He is with a low-grade temperature.  His temperature is 99.9.  Blood pressure is 119/51.  Oxygen saturation is 100%.  Lungs:  Clear bilaterally.  Cardiac:  Regular rate and rhythm with a normal S1 and S2. There are no murmurs, rubs, or bruits.  Abdomen:  Soft, good bowel sounds.  There is no palpable abdominal mass.  There is no  palpable hepatosplenomegaly.  Extremities: No clubbing, cyanosis, or edema.  He does have some tenderness over the long bones.  With Mr. Spitler, it is probably going to take a good week or more before he will be discharged.  It just takes a while for the sickle crises to improve.  Again, doing the exchange transfusions will help.  I will continue the IV fluids for now.  There have been some reports of using therapeutic doses of low molecular- weight heparin in sickle crises that have helped decrease the time of sickle pain.  This may be something to think about it.  I very much appreciate everybody helping out with Mr. Grandt. He is a real  nice guy.  I just feel bad that these crises are getting more frequent right now.    ______________________________ Josph Macho, M.D. PRE/MEDQ  D:  09/13/2011  T:  09/13/2011  Job:  [id number]

## 2011-09-13 NOTE — Progress Notes (Signed)
Subjective: Constipated. Had comfortable night. No new issues, otherwise.  Objective: Vital signs in last 24 hours: Temp:  [98.6 F (37 C)-99.9 F (37.7 C)] 99.9 F (37.7 C) (06/06 0800) Pulse Rate:  [59-107] 88  (06/06 0300) Resp:  [15-25] 18  (06/06 0300) BP: (105-142)/(50-90) 119/51 mmHg (06/06 0300) SpO2:  [93 %-100 %] 93 % (06/06 0300) Weight:  [69.2 kg (152 lb 8.9 oz)] 69.2 kg (152 lb 8.9 oz) (06/06 0400) Weight change: -3.8 kg (-8 lb 6 oz) Last BM Date: 09/09/11  Intake/Output from previous day: 06/05 0701 - 06/06 0700 In: 2400 [I.V.:1600; Blood:550; IV Piggyback:250] Out: 3475 [Urine:2975]     Physical Exam: General: Comfortable, alert, communicative, fully oriented, not short of breath at rest.  HEENT:  Mild clinical pallor, no jaundice, no conjunctival injection or discharge. Hydration is fair. NECK:  Supple, JVP not seen, no carotid bruits, no palpable lymphadenopathy, no palpable goiter. CHEST:  Clinically clear to auscultation, no wheezes, no crackles. HEART:  Sounds 1 and 2 heard, normal, regular, no murmurs. ABDOMEN:  Full, soft, non-tender, no palpable organomegaly, no palpable masses, normal bowel sounds. GENITALIA:  Not examined. LOWER EXTREMITIES:  No pitting edema, palpable peripheral pulses. MUSCULOSKELETAL SYSTEM:  Unremarkable. CENTRAL NERVOUS SYSTEM:  No focal neurologic deficit on gross examination.  Lab Results:  Basename 09/13/11 0407 09/12/11 0335  WBC 13.6* 16.5*  HGB 8.9* 9.7*  HCT 24.4* 25.6*  PLT 170 225    Basename 09/13/11 0407 09/12/11 0335  NA 140 134*  K 4.8 4.8  CL 109 103  CO2 27 23  GLUCOSE 108* 137*  BUN 18 14  CREATININE 1.12 1.02  CALCIUM 8.8 9.0   Recent Results (from the past 240 hour(s))  MRSA PCR SCREENING     Status: Abnormal   Collection Time   09/11/11  2:45 PM      Component Value Range Status Comment   MRSA by PCR POSITIVE (*) NEGATIVE  Final      Studies/Results: Dg Chest 2 View  09/11/2011  *RADIOLOGY  REPORT*  Clinical Data: Sickle cell, diffuse chest pain  CHEST - 2 VIEW  Comparison: 09/10/2011; 04/19/2011; 04/17/2011  Findings: Grossly unchanged borderline enlarged cardiac silhouette given slightly decreased lung volumes. Minimal bibasilar heterogeneous opacities, favored to represent atelectasis.  There is mild elevation of the right hemidiaphragm.  No pleural effusion or pneumothorax.  Post cholecystectomy.  Unchanged stigmata of sickle cell disease affecting multiple thoracolumbar vertebral bodies.  Increased sclerosis of the bilateral humeral head, grossly unchanged.  Post cholecystectomy.  IMPRESSION: Decreased lung volumes with bibasilar opacities, left greater than right, possibly atelectasis.  Original Report Authenticated By: Waynard Reeds, M.D.   Dg Chest Port 1 View  09/12/2011  *RADIOLOGY REPORT*  Clinical Data: Evaluate central line placement.  PORTABLE CHEST - 1 VIEW  Comparison: Chest x-ray 09/12/2011.  Findings: There has been interval placement of a left internal jugular central venous catheter.  This catheter crosses the midline and extends upward, likely within the right subclavian vein.  No pneumothorax.  Lung volumes are low.  No consolidative airspace disease.  No pleural effusions.  Mild pulmonary venous congestion without frank pulmonary edema.  Heart size is mildly enlarged. The patient is rotated to the left on today's exam, resulting in distortion of the mediastinal contours and reduced diagnostic sensitivity and specificity for mediastinal pathology.  Surgical clips project over the right upper quadrant of the abdomen, likely from prior cholecystectomy.  IMPRESSION: 1.  Tip of the new left  internal jugular central venous catheter is misdirected into the right subclavian vein. 2.  Low lung volumes with pulmonary venous congestion. 3.  Mild cardiomegaly.  Original Report Authenticated By: Florencia Reasons, M.D.   Dg Chest Port 1 View  09/12/2011  *RADIOLOGY REPORT*  Clinical  Data: Sickle cell crisis.  Chest pain.  PORTABLE CHEST - 1 VIEW  Comparison: Two-view chest x-rays yesterday, 09/10/2011 and portable chest x-ray 04/17/2011, 11/13/2010.  Findings: Suboptimal inspiration.  Cardiac silhouette moderately enlarged but stable.  Interval development of pulmonary venous hypertension and perhaps mild diffuse interstitial pulmonary edema. No confluent airspace consolidation.  IMPRESSION: Cardiomegaly.  Mild diffuse interstitial pulmonary edema suspected, consistent with mild CHF and/or fluid overload.  Original Report Authenticated By: Arnell Sieving, M.D.    Medications: Scheduled Meds:    . acetaminophen  650 mg Oral Once  . antiseptic oral rinse  15 mL Mouth Rinse BID  . ceFEPime (MAXIPIME) IV  1 g Intravenous Q12H  . Chlorhexidine Gluconate Cloth  6 each Topical Q0600  . diphenhydrAMINE  25 mg Intravenous Once  . enoxaparin  40 mg Subcutaneous Q24H  . famotidine (PEPCID) IV  20 mg Intravenous Q12H  . folic acid  1 mg Oral Daily  . furosemide  20 mg Intravenous Once  . hydroxyurea  1,000 mg Oral Daily  . ketorolac  30 mg Intravenous Q12H  . levofloxacin (LEVAQUIN) IV  750 mg Intravenous Q24H  . multivitamin with minerals  1 tablet Oral Daily  . mupirocin ointment  1 application Nasal BID  . oxyCODONE  80 mg Oral Q12H  . sodium chloride  3 mL Intravenous Q12H  . vitamin A & D      . DISCONTD: ceFEPime (MAXIPIME) IV  1 g Intravenous Q12H   Continuous Infusions:    . sodium chloride 75 mL/hr at 09/13/11 0600   PRN Meds:.acetaminophen, camphor-menthol, diphenhydrAMINE, diphenhydrAMINE, hydrOXYzine, nalbuphine, DISCONTD: diphenhydrAMINE, DISCONTD: fentaNYL  Assessment/Plan:  Active Problems:  1. Sickle cell anemia with crisis: Patient presented in acute pain crisis, with pattern of pain, following the usual pattern of his crises. No obvious precipitant. Managing with iv fluids, analgesics, Folate and Hydroxyurea. Choice of pain medication is proving  problematic, due to patient's allergy and intolerance, to Morphine and Dilaudid. Currently on prn Fentanyl. But previously, has required Toradol and Nalbuphine. Dr Arlan Organ, provided hematology consultation, and recommended total EBT, which was commenced on 09/12/11, with satisfactory results. This will be carried out over 3 days. Patient had a comfortable night.  2. Acute respiratory failure with hypercapnia/Acute chest syndrome due to sickle cell crisis: Patient was initially admitted to [facility] on 09/10/11, then tranfered to [facility] on 09/11/11, due to worsening clinical status, with Hypercarbia and respiratory acidosis.  Temperature was 101.9, chest x-ray from 09/10/11, showed no focal airspace opacities however the chest x-ray from 09/11/11, showed decreased lung volumes with bibasilar opacities/atelectasis. He also had a significant leukocytosis of 20.8. He was initially managed with Vancomycin/Zosyn, but later changed to Cefepime/Levaquin,(day#2 on 09/12/11). Temperature reached a high of 102.9, but patient has since defervesced, wcc appears to have plateaued and Oxygen requirements are less. ABG of 09/12/11, showed PH 7.327, PCO2 46.7, PO2 87.5 and Bicarb 23.8. This is improved. CXR showed no consolidative airspace disease, although there was suspicion of mild interstitial edema. Patent is now saturating at 93%-100% on 3L. Cefepime was discontinued on 09/12/11, by the critical care team. Now on Levaquin, day#3/7. Will change to PO. 3. Constipation: Secondary to opioid analgesics. Will  commence bowel regimen.  Comment: Will reduce iv fluids to 50cc/hr. Stable for transfer to Oncology floor.   LOS: 2 days   [name],CHRISTOPHER 09/13/2011, 8:37 AM

## 2011-09-13 NOTE — Care Management Note (Unsigned)
    Page 1 of 1   09/13/2011     9:10:40 AM   CARE MANAGEMENT NOTE 09/13/2011  Patient:  [name], [name]   Account Number:  [id number]  Date Initiated:  09/13/2011  Documentation initiated by:  Lanier Clam  Subjective/Objective Assessment:   ADMITTED W/ACUTE CHEST SYNDROME,SICKLE CELL CRISIS.     Action/Plan:   FROM HOME   Anticipated DC Date:  09/19/2011   Anticipated DC Plan:  HOME/SELF CARE      DC Planning Services  CM consult      Choice offered to / List presented to:             Status of service:  In process, will continue to follow Medicare Important Message given?   (If response is "NO", the following Medicare IM given date fields will be blank) Date Medicare IM given:   Date Additional Medicare IM given:    Discharge Disposition:    Per UR Regulation:  Reviewed for med. necessity/level of care/duration of stay  If discussed at Long Length of Stay Meetings, dates discussed:    Comments:  09/12/11 Tirsa Gail RN,BSN NCM [phone] CONTINUE TO FOLLOW FOR PROGRESS,& D/C PLANS.

## 2011-09-14 ENCOUNTER — Other Ambulatory Visit: Payer: Medicare Other | Admitting: Lab

## 2011-09-14 ENCOUNTER — Ambulatory Visit: Payer: Medicare Other | Admitting: Hematology & Oncology

## 2011-09-14 DIAGNOSIS — J9600 Acute respiratory failure, unspecified whether with hypoxia or hypercapnia: Secondary | ICD-10-CM

## 2011-09-14 DIAGNOSIS — D7289 Other specified disorders of white blood cells: Secondary | ICD-10-CM

## 2011-09-14 DIAGNOSIS — R509 Fever, unspecified: Secondary | ICD-10-CM

## 2011-09-14 DIAGNOSIS — D5700 Hb-SS disease with crisis, unspecified: Secondary | ICD-10-CM

## 2011-09-14 LAB — BASIC METABOLIC PANEL
BUN: 10 mg/dL (ref 6–23)
Chloride: 108 mEq/L (ref 96–112)
GFR calc Af Amer: >90 mL/min (ref >90)
Potassium: 4.5 mEq/L (ref 3.5–5.1)

## 2011-09-14 LAB — CBC
HCT: 28.4 % — ABNORMAL LOW (ref 39.0–52.0)
Hemoglobin: 10.5 g/dL — ABNORMAL LOW (ref 13.0–17.0)
RDW: 20.6 % — ABNORMAL HIGH (ref 11.5–15.5)
WBC: 11.5 10*3/uL — ABNORMAL HIGH (ref 4.0–10.5)

## 2011-09-14 MED ORDER — LORAZEPAM 1 MG PO TABS
1.0000 mg | ORAL_TABLET | Freq: Once | ORAL | Status: AC
  Administered 2011-09-14: 1 mg via ORAL
  Filled 2011-09-14: qty 1

## 2011-09-14 MED ORDER — APREPITANT 80 & 125 MG PO MISC
125.0000 mg | Freq: Once | ORAL | Status: AC
  Administered 2011-09-14: 125 mg via ORAL
  Filled 2011-09-14: qty 3

## 2011-09-14 MED ORDER — FUROSEMIDE 10 MG/ML IJ SOLN
20.0000 mg | Freq: Once | INTRAMUSCULAR | Status: AC
  Administered 2011-09-14: 20 mg via INTRAVENOUS
  Filled 2011-09-14: qty 2

## 2011-09-14 MED ORDER — DIPHENHYDRAMINE HCL 50 MG/ML IJ SOLN: 25.0000 mg | Freq: Once | INTRAMUSCULAR | Status: DC

## 2011-09-14 MED ORDER — FENTANYL CITRATE 0.05 MG/ML IJ SOLN
25.0000 ug | INTRAMUSCULAR | Status: DC | PRN
  Administered 2011-09-14 – 2011-09-18 (×23): 25 ug via INTRAVENOUS
  Filled 2011-09-14 (×24): qty 2

## 2011-09-14 MED ORDER — ACETAMINOPHEN 325 MG PO TABS
650.0000 mg | ORAL_TABLET | Freq: Once | ORAL | Status: AC
  Administered 2011-09-14: 650 mg via ORAL

## 2011-09-14 MED ORDER — OXYCODONE HCL 5 MG PO TABS
30.0000 mg | ORAL_TABLET | ORAL | Status: DC | PRN
  Administered 2011-09-14: 60 mg via ORAL
  Administered 2011-09-16: 30 mg via ORAL
  Administered 2011-09-18: 60 mg via ORAL
  Filled 2011-09-14: qty 6
  Filled 2011-09-14 (×2): qty 12

## 2011-09-14 MED ORDER — APREPITANT 80 & 125 MG PO TRIPAK DAY 2 & 3
80.0000 mg | Freq: Every day | ORAL | Status: AC
  Administered 2011-09-15 – 2011-09-16 (×2): 80 mg via ORAL
  Filled 2011-09-14: qty 2

## 2011-09-14 NOTE — Progress Notes (Signed)
RRT consult.Called for concerned bout HR 133 from baseline of 90's.  Pt writhing in bed from side to side, rubbing face over and over. When asked by girlfriend he tells her he is more in pain than itching. Instructed to give OXY IR for pain at this time. Support given.

## 2011-09-14 NOTE — Progress Notes (Signed)
[name] is suffering from pruritus. He has numerous allergies to IV narcotics. And we tried him on some new pain but this apparently is causing pruritus. He is on oral agents (OxyContin and OxyIR and (but these do not cause him issues.  Because of his pruritus, he pulled out his central line. The IV team will not put in a PICC line because of his pruritus. [name] is not aware of how bad he scratches himself.  I want to try him on him Emend as there have been some studies which have shown that this can help with pruritus.  He still has pain. Unfortunately this is tough to treat because of the reactions I has to IV pain medications.    He cannot be exchanged right now because of the lack of a central line. I will go ahead and just transfuse him. I'll probably does give him 1 unit.    There is no fever. Blood pressure is okay.  His lab work shows a hemoglobin to be 10.5 hematocrit 28.4. Posterior 167. White cell count 11.5. His BUN you and creatinine are okay. I'll need to do daily retic count on him.  His lungs are clear. Cardiac exam tachycardic regular. He has no murmurs rubs or bruits. Abdominal exam soft. There is no fluid wave. There is no palpable hepatospleno megaly. Extremities shows no clubbing cyanosis or edema.  Hopefully, we can get this pruritus under better control. This will make [name] feel much better.  He will continue IV fluids. He does have an incentive spirometer.  [name]

## 2011-09-14 NOTE — Progress Notes (Signed)
Subjective: Pain is largely controlled, but according to patient, his breakthrough medication is only effective for very short periods.  Objective: Vital signs in last 24 hours: Temp:  [98.3 F (36.8 C)-100 F (37.8 C)] 98.3 F (36.8 C) (06/07 1153) Pulse Rate:  [71-131] 111  (06/07 1230) Resp:  [16-20] 18  (06/07 1153) BP: (115-148)/(57-96) 115/57 mmHg (06/07 1153) SpO2:  [88 %-100 %] 90 % (06/07 1153) Weight change:  Last BM Date: 09/09/11  Intake/Output from previous day: 06/06 0701 - 06/07 0700 In: 275 [P.O.:120; I.V.:75] Out: 1795 [Urine:1225] Total I/O In: -  Out: 850 [Urine:850]   Physical Exam: General: Comfortable, although a bit restless, but alert, communicative, fully oriented, not short of breath at rest.  HEENT:  Mild clinical pallor, no jaundice, no conjunctival injection or discharge. Hydration is fair. NECK:  Supple, JVP not seen, no carotid bruits, no palpable lymphadenopathy, no palpable goiter. CHEST:  Clinically clear to auscultation, no wheezes, no crackles. HEART:  Sounds 1 and 2 heard, normal, regular, no murmurs. ABDOMEN:  Full, soft, non-tender, no palpable organomegaly, no palpable masses, normal bowel sounds. GENITALIA:  Not examined. LOWER EXTREMITIES:  No pitting edema, palpable peripheral pulses. MUSCULOSKELETAL SYSTEM:  Unremarkable. CENTRAL NERVOUS SYSTEM:  No focal neurologic deficit on gross examination.  Lab Results:  Basename 09/14/11 0500 09/13/11 0407  WBC 11.5* 13.6*  HGB 10.5* 8.9*  HCT 28.4* 24.4*  PLT 167 170    Basename 09/14/11 0500 09/13/11 0407  NA 142 140  K 4.5 4.8  CL 108 109  CO2 28 27  GLUCOSE 100* 108*  BUN 10 18  CREATININE 0.92 1.12  CALCIUM 9.0 8.8   Recent Results (from the past 240 hour(s))  CULTURE, BLOOD (ROUTINE X 2)     Status: Normal (Preliminary result)   Collection Time   09/11/11  9:25 AM      Component Value Range Status Comment   Specimen Description BLOOD RIGHT WRIST   Final    Special  Requests BOTTLES DRAWN AEROBIC AND ANAEROBIC [place] EACH   Final    Culture  Setup Time [id number]   Final    Culture     Final    Value:        BLOOD CULTURE RECEIVED NO GROWTH TO DATE CULTURE WILL BE HELD FOR 5 DAYS BEFORE ISSUING A FINAL NEGATIVE REPORT   Report Status PENDING   Incomplete   CULTURE, BLOOD (ROUTINE X 2)     Status: Normal (Preliminary result)   Collection Time   09/11/11  9:30 AM      Component Value Range Status Comment   Specimen Description BLOOD RIGHT HAND   Final    Special Requests BOTTLES DRAWN AEROBIC ONLY 4CC   Final    Culture  Setup Time [id number]   Final    Culture     Final    Value:        BLOOD CULTURE RECEIVED NO GROWTH TO DATE CULTURE WILL BE HELD FOR 5 DAYS BEFORE ISSUING A FINAL NEGATIVE REPORT   Report Status PENDING   Incomplete   MRSA PCR SCREENING     Status: Abnormal   Collection Time   09/11/11  2:45 PM      Component Value Range Status Comment   MRSA by PCR POSITIVE (*) NEGATIVE  Final      Studies/Results: Dg Chest Port 1 View  09/12/2011  *RADIOLOGY REPORT*  Clinical Data: Evaluate central line placement.  PORTABLE CHEST - 1 VIEW  Comparison: Chest x-ray 09/12/2011.  Findings: There has been interval placement of a left internal jugular central venous catheter.  This catheter crosses the midline and extends upward, likely within the right subclavian vein.  No pneumothorax.  Lung volumes are low.  No consolidative airspace disease.  No pleural effusions.  Mild pulmonary venous congestion without frank pulmonary edema.  Heart size is mildly enlarged. The patient is rotated to the left on today's exam, resulting in distortion of the mediastinal contours and reduced diagnostic sensitivity and specificity for mediastinal pathology.  Surgical clips project over the right upper quadrant of the abdomen, likely from prior cholecystectomy.  IMPRESSION: 1.  Tip of the new left internal jugular central venous catheter is misdirected into the right subclavian  vein. 2.  Low lung volumes with pulmonary venous congestion. 3.  Mild cardiomegaly.  Original Report Authenticated By: Florencia Reasons, M.D.    Medications: Scheduled Meds:    . acetaminophen  650 mg Oral Once  . acetaminophen  650 mg Oral Once  . antiseptic oral rinse  15 mL Mouth Rinse BID  . Chlorhexidine Gluconate Cloth  6 each Topical Q0600  . diphenhydrAMINE  25 mg Intravenous Once  . diphenhydrAMINE  25 mg Intravenous Once  . enoxaparin  40 mg Subcutaneous Q24H  . famotidine  20 mg Oral BID  . folic acid  1 mg Oral Daily  . furosemide  20 mg Intravenous Once  . hydroxyurea  1,000 mg Oral Daily  . ketorolac  30 mg Intravenous Q12H  . levofloxacin  500 mg Oral Q24H  . multivitamin with minerals  1 tablet Oral Daily  . mupirocin ointment  1 application Nasal BID  . oxyCODONE  80 mg Oral Q12H  . polyethylene glycol  17 g Oral Daily  . sodium chloride  3 mL Intravenous Q12H   Continuous Infusions:    . sodium chloride 50 mL/hr at 09/13/11 0955   PRN Meds:.acetaminophen, camphor-menthol, diphenhydrAMINE, diphenhydrAMINE, hydrOXYzine, oxyCODONE, DISCONTD: nalbuphine, DISCONTD: oxyCODONE, DISCONTD: oxyCODONE  Assessment/Plan:  Active Problems:  1. Sickle cell anemia with crisis: Patient presented in acute pain crisis, with pattern of pain, following the usual pattern of his crises. No obvious precipitant. Managing with iv fluids, analgesics, Folate and Hydroxyurea. Choice of pain medication is proving problematic, due to patient's allergy and intolerance, to Morphine and Dilaudid. Currently on prn Fentanyl, Oxycontin, and prn OxyIR. But previously, has required Toradol and Nalbuphine. Dr Arlan Organ, provided hematology consultation, and recommended total EBT, which was commenced on 09/12/11, with satisfactory results. This will be carried out over a total of 3 days, to be concluded today.  2. Acute respiratory failure with hypercapnia/Acute chest syndrome due to sickle cell  crisis: Patient was initially admitted to [facility] on 09/10/11, then tranfered to [facility] on 09/11/11, due to worsening clinical status, with Hypercarbia and respiratory acidosis.  Temperature was 101.9, chest x-ray from 09/10/11, showed no focal airspace opacities however the chest x-ray from 09/11/11, showed decreased lung volumes with bibasilar opacities/atelectasis. He also had a significant leukocytosis of 20.8. He was initially managed with Vancomycin/Zosyn, but later changed to Cefepime/Levaquin,(day#2 on 09/12/11). Temperature reached a high of 102.9, but patient has since defervesced, wcc appears to have plateaued and Oxygen requirements are less. ABG of 09/12/11, showed PH 7.327, PCO2 46.7, PO2 87.5 and Bicarb 23.8. This is improved. CXR showed no consolidative airspace disease, although there was suspicion of mild interstitial edema. Patent is now saturating at 93%-100% on 3L. Cefepime was discontinued on 09/12/11,  by the critical care team. Now on Levaquin, day#4/7. Changed to PO on 09/13/11. 3. Constipation: Secondary to opioid analgesics. On bowel regimen.  Comment: For discharge, when stable.    LOS: 3 days   Haylin Camilli,CHRISTOPHER 09/14/2011, 2:38 PM

## 2011-09-14 NOTE — Progress Notes (Signed)
RN walked into room and found pt had unknowingly pulled out IJ CVL while thrashing around in bed from itching. Vaseline gauze placed over site and IV RN notified.  MD notified. Orders received for PICC line. Will continue to monitor

## 2011-09-15 DIAGNOSIS — D7289 Other specified disorders of white blood cells: Secondary | ICD-10-CM

## 2011-09-15 DIAGNOSIS — D5700 Hb-SS disease with crisis, unspecified: Secondary | ICD-10-CM

## 2011-09-15 DIAGNOSIS — J9600 Acute respiratory failure, unspecified whether with hypoxia or hypercapnia: Secondary | ICD-10-CM

## 2011-09-15 DIAGNOSIS — R509 Fever, unspecified: Secondary | ICD-10-CM

## 2011-09-15 LAB — CBC
MCH: 30 pg (ref 26.0–34.0)
MCHC: 36.4 g/dL — ABNORMAL HIGH (ref 30.0–36.0)
MCV: 82.3 fL (ref 78.0–100.0)
Platelets: 199 10*3/uL (ref 150–400)
RBC: 3.67 MIL/uL — ABNORMAL LOW (ref 4.22–5.81)

## 2011-09-15 LAB — BASIC METABOLIC PANEL
CO2: 27 mEq/L (ref 19–32)
Calcium: 8.8 mg/dL (ref 8.4–10.5)
Creatinine, Ser: 0.95 mg/dL (ref 0.50–1.35)
GFR calc non Af Amer: >90 mL/min (ref >90)

## 2011-09-15 MED ORDER — GABAPENTIN 300 MG PO CAPS
300.0000 mg | ORAL_CAPSULE | Freq: Three times a day (TID) | ORAL | Status: AC
  Administered 2011-09-15 – 2011-09-17 (×9): 300 mg via ORAL
  Filled 2011-09-15 (×9): qty 1

## 2011-09-15 NOTE — Progress Notes (Signed)
[name]   DOB:Aug 09, 1980   JX#:[id number]   OZH#:[id number]  Subjective: patient remains very itchy, scratching through evaluation this AM; pain in both shoulders; having "OK" bowel movements; firned or spouse sleeping in room   Objective: young African American Male examined sitting up in bed Filed Vitals:   09/15/11 0635  BP: 137/79  Pulse: 104  Temp: 98.5 F (36.9 C)  Resp: 18    Body mass index is 25.00 kg/(m^2).  Intake/Output Summary (Last 24 hours) at 09/15/11 0901 Last data filed at 09/15/11 6295  Gross per 24 hour  Intake      0 ml  Output   2450 ml  Net  -2450 ml     Sclerae unicteric  Oropharynx clear  No peripheral adenopathy  Lungs clear -- no rales or rhonchi  Heart rapid rate, regular rhythm  Abdomen benign  MSK no focal spinal tenderness, discomfort R shulder with active movement  Neuro nonfocal   CBG (last 3)  No results found for this basename: GLUCAP:3 in the last 72 hours   Labs:  Lab Results  Component Value Date   WBC 11.3* 09/15/2011   HGB 11.0* 09/15/2011   HCT 30.2* 09/15/2011   MCV 82.3 09/15/2011   PLT 199 09/15/2011   NEUTROABS 13.9* 09/12/2011     Lab 09/15/11 0410 09/14/11 0500 09/13/11 0407 09/12/11 0335 09/11/11 0920  NA 141 142 140 134* 132*  K 3.9 4.5 4.8 4.8 4.2  CL 105 108 109 103 102  CO2 27 28 27 23 21   GLUCOSE 93 100* 108* 137* 146*  BUN 14 10 18 14 10   CREATININE 0.95 0.92 1.12 1.02 1.08  CALCIUM 8.8 9.0 8.8 9.0 8.2*  MG -- -- -- -- --    Urine Studies No results found for this basename: UACOL:2,UAPR:2,USPG:2,UPH:2,UTP:2,UGL:2,UKET:2,UBIL:2,UHGB:2,UNIT:2,UROB:2,ULEU:2,UEPI:2,UWBC:2,URBC:2,UBAC:2,CAST:2,CRYS:2,UCOM:2,BILUA:2 in the last 72 hours     Studies:  No results found.  Assessment: 31 y.o. 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 If pruritus  persists beyond 48 hours on current meds consider switching to methadone.  Will follow with you.   Sairah Knobloch C 09/15/2011

## 2011-09-15 NOTE — Progress Notes (Signed)
Subjective: Patient is sleeping peacefully at this time, so did not awaken him, given his problems with pruritus/pain, this is probably a well-deserved rest.  Objective: Vital signs in last 24 hours: Temp:  [97.7 F (36.5 C)-99.7 F (37.6 C)] 98.5 F (36.9 C) (06/08 0635) Pulse Rate:  [99-112] 104  (06/08 0635) Resp:  [14-20] 18  (06/08 0635) BP: (122-145)/(67-84) 137/79 mmHg (06/08 0635) SpO2:  [91 %-93 %] 93 % (06/08 0635) Weight change:  Last BM Date: 09/09/11  Intake/Output from previous day: 06/07 0701 - 06/08 0700 In: -  Out: 2450 [Urine:2450] Total I/O In: 3 [I.V.:3] Out: -    Physical Exam: Not formally examined, as asleep, but does not appear to be in acute discomfort.   Lab Results:  Basename 09/15/11 0410 09/14/11 0500  WBC 11.3* 11.5*  HGB 11.0* 10.5*  HCT 30.2* 28.4*  PLT 199 167    Basename 09/15/11 0410 09/14/11 0500  NA 141 142  K 3.9 4.5  CL 105 108  CO2 27 28  GLUCOSE 93 100*  BUN 14 10  CREATININE 0.95 0.92  CALCIUM 8.8 9.0   Recent Results (from the past 240 hour(s))  CULTURE, BLOOD (ROUTINE X 2)     Status: Normal (Preliminary result)   Collection Time   09/11/11  9:25 AM      Component Value Range Status Comment   Specimen Description BLOOD RIGHT WRIST   Final    Special Requests BOTTLES DRAWN AEROBIC AND ANAEROBIC [facility] EACH   Final    Culture  Setup Time [id number]   Final    Culture     Final    Value:        BLOOD CULTURE RECEIVED NO GROWTH TO DATE CULTURE WILL BE HELD FOR 5 DAYS BEFORE ISSUING A FINAL NEGATIVE REPORT   Report Status PENDING   Incomplete   CULTURE, BLOOD (ROUTINE X 2)     Status: Normal (Preliminary result)   Collection Time   09/11/11  9:30 AM      Component Value Range Status Comment   Specimen Description BLOOD RIGHT HAND   Final    Special Requests BOTTLES DRAWN AEROBIC ONLY 4CC   Final    Culture  Setup Time [id number]   Final    Culture     Final    Value:        BLOOD CULTURE RECEIVED NO GROWTH TO DATE  CULTURE WILL BE HELD FOR 5 DAYS BEFORE ISSUING A FINAL NEGATIVE REPORT   Report Status PENDING   Incomplete   MRSA PCR SCREENING     Status: Abnormal   Collection Time   09/11/11  2:45 PM      Component Value Range Status Comment   MRSA by PCR POSITIVE (*) NEGATIVE  Final      Studies/Results: No results found.  Medications: Scheduled Meds:    . acetaminophen  650 mg Oral Once  . acetaminophen  650 mg Oral Once  . antiseptic oral rinse  15 mL Mouth Rinse BID  . aprepitant  125 mg Oral Once   Followed by  . aprepitant  80 mg Oral Daily  . Chlorhexidine Gluconate Cloth  6 each Topical Q0600  . diphenhydrAMINE  25 mg Intravenous Once  . diphenhydrAMINE  25 mg Intravenous Once  . enoxaparin  40 mg Subcutaneous Q24H  . famotidine  20 mg Oral BID  . folic acid  1 mg Oral Daily  . furosemide  20 mg Intravenous Once  .  gabapentin  300 mg Oral TID  . hydroxyurea  1,000 mg Oral Daily  . levofloxacin  500 mg Oral Q24H  . LORazepam  1 mg Oral Once  . multivitamin with minerals  1 tablet Oral Daily  . mupirocin ointment  1 application Nasal BID  . oxyCODONE  80 mg Oral Q12H  . polyethylene glycol  17 g Oral Daily  . sodium chloride  3 mL Intravenous Q12H   Continuous Infusions:    . sodium chloride 50 mL/hr at 09/13/11 0955   PRN Meds:.acetaminophen, camphor-menthol, diphenhydrAMINE, diphenhydrAMINE, fentaNYL, hydrOXYzine, oxyCODONE, DISCONTD: oxyCODONE  Assessment/Plan:  Active Problems:  1. Sickle cell anemia with crisis: Patient presented in acute pain crisis, with pattern of pain, following the usual pattern of his crises. No obvious precipitant. Managing with iv fluids, analgesics, Folate and Hydroxyurea. Choice of pain medication is proving problematic, due to patient's allergy and intolerance, to Morphine and Dilaudid. Currently on prn Fentanyl, Oxycontin, and prn OxyIR. But previously, has required Toradol and Nalbuphine. Dr Arlan Organ, provided hematology consultation,  and recommended total EBT, which was commenced on 09/12/11, with satisfactory results. Because of problematic iv access, EBT was not done on 09/14/11. Instead, a single unit of PRBC was transfused. Much appreciate hematology input, with this difficult situation.  2. Acute respiratory failure with hypercapnia/Acute chest syndrome due to sickle cell crisis: Patient was initially admitted to [facility] on 09/10/11, then tranfered to [facility] on 09/11/11, due to worsening clinical status, with Hypercarbia and respiratory acidosis.  Temperature was 101.9, chest x-ray from 09/10/11, showed no focal airspace opacities however the chest x-ray from 09/11/11, showed decreased lung volumes with bibasilar opacities/atelectasis. He also had a significant leukocytosis of 20.8. He was initially managed with Vancomycin/Zosyn, but later changed to Cefepime/Levaquin,(day#2 on 09/12/11). Temperature reached a high of 102.9, but patient has since defervesced, wcc appears to have plateaued and Oxygen requirements are less. ABG of 09/12/11, showed PH 7.327, PCO2 46.7, PO2 87.5 and Bicarb 23.8. This is improved. CXR showed no consolidative airspace disease, although there was suspicion of mild interstitial edema. Patent is now saturating at 93%-100% on 3L. Cefepime was discontinued on 09/12/11, by the critical care team. Now on Levaquin, day#5/7. Changed to PO on 09/13/11. 3. Constipation: Secondary to opioid analgesics. On bowel regimen.  Comment: For discharge, when stable.    LOS: 4 days   Samel Bruna,CHRISTOPHER 09/15/2011, 12:39 PM

## 2011-09-16 ENCOUNTER — Inpatient Hospital Stay (HOSPITAL_COMMUNITY): Payer: Medicare Other

## 2011-09-16 DIAGNOSIS — J9600 Acute respiratory failure, unspecified whether with hypoxia or hypercapnia: Secondary | ICD-10-CM

## 2011-09-16 DIAGNOSIS — D7289 Other specified disorders of white blood cells: Secondary | ICD-10-CM

## 2011-09-16 DIAGNOSIS — D5700 Hb-SS disease with crisis, unspecified: Secondary | ICD-10-CM

## 2011-09-16 DIAGNOSIS — R509 Fever, unspecified: Secondary | ICD-10-CM

## 2011-09-16 LAB — CBC
MCH: 30.4 pg (ref 26.0–34.0)
MCV: 84.9 fL (ref 78.0–100.0)
Platelets: 188 10*3/uL (ref 150–400)
RDW: 23.3 % — ABNORMAL HIGH (ref 11.5–15.5)
WBC: 10.6 10*3/uL — ABNORMAL HIGH (ref 4.0–10.5)

## 2011-09-16 LAB — TYPE AND SCREEN
Unit division: 0
Unit division: 0

## 2011-09-16 NOTE — Progress Notes (Signed)
Subjective: Patient is seen and examined today. He is feeling fine with no specific complaints except for pain in the right shoulder and left hip. His itching has significant improvement after restarting Neurontin by Dr. Darnelle Catalan yesterday. He has no significant chest pain. No nausea or vomiting.  Objective: Vital signs in last 24 hours: Temp:  [98.2 F (36.8 C)-98.7 F (37.1 C)] 98.2 F (36.8 C) (06/09 0546) Pulse Rate:  [80-90] 82  (06/09 0546) Resp:  [16-18] 18  (06/09 0546) BP: (133-142)/(66-90) 140/66 mmHg (06/09 0546) SpO2:  [97 %-100 %] 97 % (06/09 0546) Weight:  [154 lb 4.8 oz (69.99 kg)] 154 lb 4.8 oz (69.99 kg) (06/09 0546)  Intake/Output from previous day: 06/08 0800 - 06/09 0759 In: 1003 [P.O.:600; I.V.:403] Out: 1200 [Urine:1200] Intake/Output this shift:    General appearance: alert, cooperative and no distress Resp: clear to auscultation bilaterally Cardio: regular rate and rhythm, S1, S2 normal, no murmur, click, rub or gallop GI: soft, non-tender; bowel sounds normal; no masses,  no organomegaly Extremities: extremities normal, atraumatic, no cyanosis or edema  Lab Results:   Basename 09/16/11 0411 09/15/11 0410  WBC 10.6* 11.3*  HGB 11.3* 11.0*  HCT 31.6* 30.2*  PLT 188 199   BMET  Basename 09/15/11 0410 09/14/11 0500  NA 141 142  K 3.9 4.5  CL 105 108  CO2 27 28  GLUCOSE 93 100*  BUN 14 10  CREATININE 0.95 0.92  CALCIUM 8.8 9.0    Studies/Results: No results found.  Medications: I have reviewed the patient's current medications.  Assessment/Plan: This is a 31 years old African American male with sickle cell disease, admitted for pain control. The patient is doing fine today except for the right shoulder and left hip pain he is currently on several pain medications including fentanyl, OxyContin, OxyIR and recently gabapentin. Continue current pain medication. He is feeling a little bit better with less itching. He still has significant pain  in the left hip and has last imaging of the left hip was in November of 2011. He may need repeat imaging of the left hip to rule out osteonecrosis of the left femur. I discussed my recommendation was Dr. Brien Few. Continue IV hydration and incentive spirometer.    LOS: 5 days    Maleka Contino K. 09/16/2011

## 2011-09-16 NOTE — Progress Notes (Signed)
Subjective: Pain is better controlled today, and pruritus is much less. C/O left hip pain.  Objective: Vital signs in last 24 hours: Temp:  [98.2 F (36.8 C)-98.7 F (37.1 C)] 98.2 F (36.8 C) (06/09 0546) Pulse Rate:  [80-90] 82  (06/09 0546) Resp:  [16-18] 18  (06/09 0546) BP: (133-142)/(66-90) 140/66 mmHg (06/09 0546) SpO2:  [97 %-100 %] 97 % (06/09 0546) Weight:  [69.99 kg (154 lb 4.8 oz)] 69.99 kg (154 lb 4.8 oz) (06/09 0546) Weight change:  Last BM Date: 09/09/11  Intake/Output from previous day: 06/08 0701 - 06/09 0700 In: 1003 [P.O.:600; I.V.:403] Out: 1200 [Urine:1200]     Physical Exam: General: Comfortable, alert, communicative, fully oriented, not short of breath at rest.  HEENT: Mild clinical pallor, no jaundice, no conjunctival injection or discharge. Hydration is fair.  NECK: Supple, JVP not seen, no carotid bruits, no palpable lymphadenopathy, no palpable goiter.  CHEST: Clinically clear to auscultation, no wheezes, no crackles.  HEART: Sounds 1 and 2 heard, normal, regular, no murmurs.  ABDOMEN: Full, soft, non-tender, no palpable organomegaly, no palpable masses, normal bowel sounds.  GENITALIA: Not examined.  LOWER EXTREMITIES: No pitting edema, palpable peripheral pulses.  MUSCULOSKELETAL SYSTEM: Unremarkable.  CENTRAL NERVOUS SYSTEM: No focal neurologic deficit on gross examination   Lab Results:  Basename 09/16/11 0411 09/15/11 0410  WBC 10.6* 11.3*  HGB 11.3* 11.0*  HCT 31.6* 30.2*  PLT 188 199    Basename 09/15/11 0410 09/14/11 0500  NA 141 142  K 3.9 4.5  CL 105 108  CO2 27 28  GLUCOSE 93 100*  BUN 14 10  CREATININE 0.95 0.92  CALCIUM 8.8 9.0   Recent Results (from the past 240 hour(s))  CULTURE, BLOOD (ROUTINE X 2)     Status: Normal (Preliminary result)   Collection Time   09/11/11  9:25 AM      Component Value Range Status Comment   Specimen Description BLOOD RIGHT WRIST   Final    Special Requests BOTTLES DRAWN AEROBIC AND  ANAEROBIC [facility] EACH   Final    Culture  Setup Time [id number]   Final    Culture     Final    Value:        BLOOD CULTURE RECEIVED NO GROWTH TO DATE CULTURE WILL BE HELD FOR 5 DAYS BEFORE ISSUING A FINAL NEGATIVE REPORT   Report Status PENDING   Incomplete   CULTURE, BLOOD (ROUTINE X 2)     Status: Normal (Preliminary result)   Collection Time   09/11/11  9:30 AM      Component Value Range Status Comment   Specimen Description BLOOD RIGHT HAND   Final    Special Requests BOTTLES DRAWN AEROBIC ONLY 4CC   Final    Culture  Setup Time [id number]   Final    Culture     Final    Value:        BLOOD CULTURE RECEIVED NO GROWTH TO DATE CULTURE WILL BE HELD FOR 5 DAYS BEFORE ISSUING A FINAL NEGATIVE REPORT   Report Status PENDING   Incomplete   MRSA PCR SCREENING     Status: Abnormal   Collection Time   09/11/11  2:45 PM      Component Value Range Status Comment   MRSA by PCR POSITIVE (*) NEGATIVE  Final      Studies/Results: No results found.  Medications: Scheduled Meds:    . acetaminophen  650 mg Oral Once  . antiseptic oral rinse  15 mL Mouth Rinse BID  . aprepitant  80 mg Oral Daily  . Chlorhexidine Gluconate Cloth  6 each Topical Q0600  . diphenhydrAMINE  25 mg Intravenous Once  . diphenhydrAMINE  25 mg Intravenous Once  . enoxaparin  40 mg Subcutaneous Q24H  . famotidine  20 mg Oral BID  . folic acid  1 mg Oral Daily  . gabapentin  300 mg Oral TID  . hydroxyurea  1,000 mg Oral Daily  . levofloxacin  500 mg Oral Q24H  . multivitamin with minerals  1 tablet Oral Daily  . mupirocin ointment  1 application Nasal BID  . oxyCODONE  80 mg Oral Q12H  . polyethylene glycol  17 g Oral Daily  . sodium chloride  3 mL Intravenous Q12H   Continuous Infusions:    . sodium chloride 50 mL/hr at 09/13/11 0955   PRN Meds:.acetaminophen, camphor-menthol, diphenhydrAMINE, diphenhydrAMINE, fentaNYL, hydrOXYzine, oxyCODONE  Assessment/Plan:  Active Problems:  1. Sickle cell anemia with  crisis: Patient presented in acute pain crisis, with pattern of pain, following the usual pattern of his crises. No obvious precipitant. Managing with iv fluids, analgesics, Folate and Hydroxyurea. Choice of pain medication is proving problematic, due to patient's allergy and intolerance, to Morphine and Dilaudid. Currently on prn Fentanyl, Oxycontin, and prn OxyIR. But previously, has required Toradol and Nalbuphine. Dr Arlan Organ, provided hematology consultation, and recommended total EBT, which was commenced on 09/12/11, with satisfactory results. Because of problematic iv access, EBT was not done on 09/14/11. Instead, a single unit of PRBC was transfused. Emend was added to Rx by Dr Myna Hidalgo, ad this seems to be effective for patient's pruritus. Dr Darnelle Catalan added Gabapentin for additional pain control. Will order left hip X-Ray, to r/o aseptic necrosis. Much appreciate hematology input, with this difficult situation.  2. Acute respiratory failure with hypercapnia/Acute chest syndrome due to sickle cell crisis: Patient was initially admitted to [facility] on 09/10/11, then tranfered to [facility] on 09/11/11, due to worsening clinical status, with Hypercarbia and respiratory acidosis.  Temperature was 101.9, chest x-ray from 09/10/11, showed no focal airspace opacities however the chest x-ray from 09/11/11, showed decreased lung volumes with bibasilar opacities/atelectasis. He also had a significant leukocytosis of 20.8. He was initially managed with Vancomycin/Zosyn, but later changed to Cefepime/Levaquin,(day#2 on 09/12/11). Temperature reached a high of 102.9, but patient has since defervesced, wcc appears to have plateaued and Oxygen requirements are less. ABG of 09/12/11, showed PH 7.327, PCO2 46.7, PO2 87.5 and Bicarb 23.8. This is improved. CXR showed no consolidative airspace disease, although there was suspicion of mild interstitial edema. Patent is now saturating at 93%-100% on 3L. Cefepime was discontinued on 09/12/11, by the  critical care team. Now on Levaquin, day#6/7. Changed to PO on 09/13/11. 3. Constipation: Secondary to opioid analgesics. On bowel regimen.  Comment: For discharge, when stable.    LOS: 5 days   Jacqualin Shirkey,CHRISTOPHER 09/16/2011, 10:03 AM

## 2011-09-17 ENCOUNTER — Inpatient Hospital Stay (HOSPITAL_COMMUNITY): Payer: Medicare Other

## 2011-09-17 DIAGNOSIS — R509 Fever, unspecified: Secondary | ICD-10-CM

## 2011-09-17 DIAGNOSIS — D5700 Hb-SS disease with crisis, unspecified: Secondary | ICD-10-CM

## 2011-09-17 DIAGNOSIS — J9600 Acute respiratory failure, unspecified whether with hypoxia or hypercapnia: Secondary | ICD-10-CM

## 2011-09-17 DIAGNOSIS — D7289 Other specified disorders of white blood cells: Secondary | ICD-10-CM

## 2011-09-17 LAB — CBC
Hemoglobin: 10.7 g/dL — ABNORMAL LOW (ref 13.0–17.0)
MCHC: 35 g/dL (ref 30.0–36.0)
RDW: 23.6 % — ABNORMAL HIGH (ref 11.5–15.5)

## 2011-09-17 LAB — PREPARE RBC (CROSSMATCH)

## 2011-09-17 LAB — BASIC METABOLIC PANEL
GFR calc Af Amer: >90 mL/min (ref >90)
GFR calc non Af Amer: >90 mL/min (ref >90)
Potassium: 4.1 mEq/L (ref 3.5–5.1)
Sodium: 138 mEq/L (ref 135–145)

## 2011-09-17 MED ORDER — SODIUM CHLORIDE 0.9 % IJ SOLN
10.0000 mL | INTRAMUSCULAR | Status: DC | PRN
  Administered 2011-09-21: 160 mL
  Administered 2011-09-23: 10 mL

## 2011-09-17 MED ORDER — LACTULOSE 10 GM/15ML PO SOLN
20.0000 g | ORAL | Status: DC
  Administered 2011-09-17 (×2): 20 g via ORAL
  Filled 2011-09-17 (×9): qty 30

## 2011-09-17 MED ORDER — IBUPROFEN 600 MG PO TABS
600.0000 mg | ORAL_TABLET | Freq: Three times a day (TID) | ORAL | Status: DC
  Administered 2011-09-18 – 2011-09-21 (×10): 600 mg via ORAL
  Filled 2011-09-17 (×13): qty 1

## 2011-09-17 NOTE — Progress Notes (Signed)
Subjective: Considerably better today. No new issues.  Objective: Vital signs in last 24 hours: Temp:  [97.8 F (36.6 C)-99.1 F (37.3 C)] 99.1 F (37.3 C) (06/10 1755) Pulse Rate:  [64-79] 68  (06/10 1755) Resp:  [12-18] 12  (06/10 1755) BP: (129-151)/(73-95) 132/79 mmHg (06/10 1755) SpO2:  [93 %-100 %] 94 % (06/10 1755) Weight:  [70.6 kg (155 lb 10.3 oz)] 70.6 kg (155 lb 10.3 oz) (06/10 0526) Weight change: 0.61 kg (1 lb 5.5 oz) Last BM Date: 09/09/11  Intake/Output from previous day: 06/09 0701 - 06/10 0700 In: 480 [P.O.:480] Out: 2300 [Urine:2300] Total I/O In: 240 [P.O.:240] Out: 300 [Other:300]   Physical Exam: General: Comfortable, alert, communicative, fully oriented, not short of breath at rest.  HEENT: Mild clinical pallor, no jaundice, no conjunctival injection or discharge. Hydration is fair.  NECK: Supple, JVP not seen, no carotid bruits, no palpable lymphadenopathy, no palpable goiter.  CHEST: Clinically clear to auscultation, no wheezes, no crackles.  HEART: Sounds 1 and 2 heard, normal, regular, no murmurs.  ABDOMEN: Full, soft, non-tender, no palpable organomegaly, no palpable masses, normal bowel sounds.  GENITALIA: Not examined.  LOWER EXTREMITIES: No pitting edema, palpable peripheral pulses.  MUSCULOSKELETAL SYSTEM: Unremarkable.  CENTRAL NERVOUS SYSTEM: No focal neurologic deficit on gross examination   Lab Results:  Basename 09/17/11 0400 09/16/11 0411  WBC 8.7 10.6*  HGB 10.7* 11.3*  HCT 30.6* 31.6*  PLT 189 188    Basename 09/17/11 0400 09/15/11 0410  NA 138 141  K 4.1 3.9  CL 102 105  CO2 27 27  GLUCOSE 98 93  BUN 10 14  CREATININE 0.79 0.95  CALCIUM 9.0 8.8   Recent Results (from the past 240 hour(s))  CULTURE, BLOOD (ROUTINE X 2)     Status: Normal (Preliminary result)   Collection Time   09/11/11  9:25 AM      Component Value Range Status Comment   Specimen Description BLOOD RIGHT WRIST   Final    Special Requests BOTTLES  DRAWN AEROBIC AND ANAEROBIC [facility] EACH   Final    Culture  Setup Time [id number]   Final    Culture     Final    Value:        BLOOD CULTURE RECEIVED NO GROWTH TO DATE CULTURE WILL BE HELD FOR 5 DAYS BEFORE ISSUING A FINAL NEGATIVE REPORT   Report Status PENDING   Incomplete   CULTURE, BLOOD (ROUTINE X 2)     Status: Normal (Preliminary result)   Collection Time   09/11/11  9:30 AM      Component Value Range Status Comment   Specimen Description BLOOD RIGHT HAND   Final    Special Requests BOTTLES DRAWN AEROBIC ONLY 4CC   Final    Culture  Setup Time [id number]   Final    Culture     Final    Value:        BLOOD CULTURE RECEIVED NO GROWTH TO DATE CULTURE WILL BE HELD FOR 5 DAYS BEFORE ISSUING A FINAL NEGATIVE REPORT   Report Status PENDING   Incomplete   MRSA PCR SCREENING     Status: Abnormal   Collection Time   09/11/11  2:45 PM      Component Value Range Status Comment   MRSA by PCR POSITIVE (*) NEGATIVE  Final      Studies/Results: Dg Hip Complete Left  09/16/2011  *RADIOLOGY REPORT*  Clinical Data: Left hip pain.  Sickle cell anemia.  LEFT  HIP - COMPLETE 2+ VIEW  Comparison: Radiographs 02/28/2010.  Findings: Diffuse slightly heterogeneous osteosclerosis is again noted consistent with sickle cell anemia.  A subchondral lucency laterally in the left femoral head is stable.  There is no subchondral collapse or significant joint space loss.  Right pelvic phleboliths appear unchanged. Oval densities overlapping the lower sacrum overlap the rectal air column and may be within the rectum. Bladder calculi are difficult to exclude.  IMPRESSION: Stable appearance of the left femoral head with stable subchondral lucency and generalized osteosclerosis. No acute osseous findings identified.  New oval densities in the pelvis may be within the rectum, although bladder calculi are difficult to exclude.  Original Report Authenticated By: Gerrianne Scale, M.D.   Mr Hip Left Wo Contrast  09/17/2011   *RADIOLOGY REPORT*  Clinical Data:  Sickle disease patient.  Chronic left hip pain. Question avascular necrosis.  MRI OF THE LEFT HIP WITHOUT CONTRAST  Technique:  Multiplanar, multisequence MR imaging was performed. No intravenous contrast was administered.  Comparison: Plain films left hip 09/16/2011 and 02/28/2010. CT abdomen and pelvis 01/15/2005.  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 Infarction is present in both femoral heads, larger on the left, with associated marrow edema. Left greater than right hip joint effusions are also seen consistent with synovitis related to bone infarcts.  The patient's left femoral head infarct is along the superior margin of the femoral head with some flattening of the periphery.  Right femoral head infarct involves the medial aspect of the articular surface.  Additional infarcts are seen involving the greater trochanters bilaterally, larger on the right.  Sacral infarction and lower lumbar spine infarcts with associated vertebral body height loss are also identified.  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  Imaged intrapelvic contents are unremarkable.  IMPRESSION:  1.  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  Small hip joint effusions are compatible with synovitis, larger on the left. 2.  Diffuse marrow signal abnormality consistent with the presence of multifocal bone infarcts and the presence of active marrow. 3.  Shortened appearance of the femoral necks and bilaterally with coxa magna deformities, unchanged.  Original Report Authenticated By: Bernadene Bell. Maricela Curet, M.D.   Dg Chest Port 1 View  09/17/2011  *RADIOLOGY REPORT*  Clinical Data: Line placement.  PORTABLE CHEST - 1 VIEW  Comparison: 09/12/2011.  Findings: Trachea is midline.  Heart is mildly enlarged.  Left PICC tip projects over the SVC.  Left  IJ central line has been removed. Lungs are low in volume.  No pleural fluid.  IMPRESSION: Low lung volumes.  No acute findings.  Original Report Authenticated By: Reyes Ivan, M.D.    Medications: Scheduled Meds:    . acetaminophen  650 mg Oral Once  . antiseptic oral rinse  15 mL Mouth Rinse BID  . diphenhydrAMINE  25 mg Intravenous Once  . diphenhydrAMINE  25 mg Intravenous Once  . enoxaparin  40 mg Subcutaneous Q24H  . famotidine  20 mg Oral BID  . folic acid  1 mg Oral Daily  . gabapentin  300 mg Oral TID  . hydroxyurea  1,000 mg Oral Daily  . lactulose  20 g Oral Q4H  . multivitamin with minerals  1 tablet Oral Daily  . mupirocin ointment  1 application Nasal BID  . oxyCODONE  80 mg Oral Q12H  . polyethylene glycol  17 g Oral Daily  . sodium chloride  3 mL Intravenous Q12H  . DISCONTD: levofloxacin  500 mg Oral Q24H   Continuous Infusions:    . sodium chloride 50 mL/hr at 09/16/11 1103   PRN Meds:.acetaminophen, camphor-menthol, diphenhydrAMINE, diphenhydrAMINE, fentaNYL, hydrOXYzine, oxyCODONE, sodium chloride  Assessment/Plan:  Active Problems:  1. Sickle cell anemia with crisis: Patient presented in acute pain crisis, with pattern of pain, following the usual pattern of his crises. No obvious precipitant. Managing with iv fluids, analgesics, Folate and Hydroxyurea. Choice of pain medication is proving problematic, due to patient's allergy and intolerance, to Morphine and Dilaudid. Currently on prn Fentanyl, Oxycontin, and prn OxyIR. But previously, has required Toradol and Nalbuphine. Dr Arlan Organ, provided hematology consultation, and recommended total EBT, which was commenced on 09/12/11, with satisfactory results. Because of problematic iv access, EBT was not done on 09/14/11. Instead, a single unit of PRBC was transfused. Emend was added to Rx by Dr Myna Hidalgo, ad this seems to be effective for patient's pruritus. Dr Darnelle Catalan added Gabapentin for additional pain  control. Left hip X-Ray of 09/16/11, showed stable appearance of the left femoral head with stable subchondral lucency and generalized osteosclerosis. No acute osseous findings identified. MRI on 09/17/11, demonstrated bone infarcts in both femoral heads with associated marrow edema, larger on the left. Small hip joint effusions are compatible with synovitis, larger on the left. Diffuse marrow signal abnormality consistent with the presence of multifocal bone infarcts and the presence of active marrow. Perhaps, NSAIDS will be helpful. Have added Motrin to Rx. Otherwise, managing per hematology.  2. Acute respiratory failure with hypercapnia/Acute chest syndrome due to sickle cell crisis: Patient was initially admitted to [facility] on 09/10/11, then tranfered to [facility] on 09/11/11, due to worsening clinical status, with Hypercarbia and respiratory acidosis.  Temperature was 101.9, chest x-ray from 09/10/11, showed no focal airspace opacities however the chest x-ray from 09/11/11, showed decreased lung volumes with bibasilar opacities/atelectasis. He also had a significant leukocytosis of 20.8. He was initially managed with Vancomycin/Zosyn, but later changed to Cefepime/Levaquin,(day#2 on 09/12/11). Temperature reached a high of 102.9, but patient has since defervesced, wcc appears to have plateaued and Oxygen requirements are less. ABG of 09/12/11, showed PH 7.327, PCO2 46.7, PO2 87.5 and Bicarb 23.8. This is improved. CXR showed no consolidative airspace disease, although there was suspicion of mild interstitial edema. Patent is now saturating at 93%-100% on 3L. Cefepime was discontinued on 09/12/11, by the critical care team. 70-day course of Levaquin was completed today. CXR of 09/17/11, showed Low lung volumes. No acute findings.  3. Constipation: Secondary to opioid analgesics. On bowel regimen.  Comment: For discharge, when stable. Disposition is per hematologist.   LOS: 6 days   Diogo Anne,CHRISTOPHER 09/17/2011, 7:22 PM

## 2011-09-17 NOTE — Progress Notes (Signed)
[name] is doing much better with his itching.  We restarted him on Emend on the 7th.  He also started some Neurontin.  The itching is pretty much resolved.  He is still complaining of a lot of bony pain.  He is having a lot of pain in his left hip.  We will have to get an MRI to make sure there is no avascular necrosis issues.  There are also problems with his right shoulder.  I think we can probably get a PICC line in him now and reinstate the exchange transfusions that we have done.  He is eating better.  He is much more alert.  He has had no problems fever wise.  His cultures have been negative to date.  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  PHYSICAL EXAM:  A well-developed, well-nourished, black gentleman in no obvious distress.  Vital signs:  98.6, pulse 65, respiratory rate 16, blood pressure 131/77.  Head/Neck:  No ocular or oral lesions.  There are no palpable cervical or supraclavicular lymph nodes.  He has no adenopathy in his neck.  Lungs:  Clear bilaterally.  Cardiac:  Regular rate and rhythm with a normal S1, S2.  There are no murmurs, rubs, or bruits.  Abdomen:  Soft with good bowel sounds.  There is no palpable abdominal mass.  He has no palpable hepatosplenomegaly.  Extremities: Decreased range of motion of the left hip.  There is some tenderness to palpation over the left hip area.  No swelling is appreciated.  Skin: No ulcerations.  Neurological:  No focal neurological deficits.  LABORATORY STUDIES:  White cell count 8.7, hemoglobin 10.7, hematocrit 30.6, platelet count 189.  BUN and creatinine were 10 and 0.79.  We will, again, get [name] back on the exchange program.  He is able to have a PICC line placed now.  We will also get the MRI set up for his left hip.  I suspect that he likely will be in the hospital for another 3 or 4 days.  This is typically standard for [name].    ______________________________ Josph Macho, M.D. PRE/MEDQ  D:   09/17/2011  T:  09/17/2011  Job:  [id number]

## 2011-09-17 NOTE — Progress Notes (Signed)
Blood exchange ordered for removal blood - blood removed over one via PICC, blood not drawing well so PICC pulled back 1 cm with better flow but blood very viscous and difficult to draw. No more drawn at this time. VSandritter RN/VABC

## 2011-09-17 NOTE — Progress Notes (Signed)
Peripherally Inserted Central Catheter/Midline Placement  The IV Nurse has discussed with the patient and/or persons authorized to consent for the patient, the purpose of this procedure and the potential benefits and risks involved with this procedure.  The benefits include less needle sticks, lab draws from the catheter and patient may be discharged home with the catheter.  Risks include, but not limited to, infection, bleeding, blood clot (thrombus formation), and puncture of an artery; nerve damage and irregular heat beat.  Alternatives to this procedure were also discussed.  PICC/Midline Placement Documentation        [name] 09/17/2011, 4:05 PM

## 2011-09-18 DIAGNOSIS — D5700 Hb-SS disease with crisis, unspecified: Secondary | ICD-10-CM

## 2011-09-18 DIAGNOSIS — D7289 Other specified disorders of white blood cells: Secondary | ICD-10-CM

## 2011-09-18 DIAGNOSIS — R509 Fever, unspecified: Secondary | ICD-10-CM

## 2011-09-18 DIAGNOSIS — J9600 Acute respiratory failure, unspecified whether with hypoxia or hypercapnia: Secondary | ICD-10-CM

## 2011-09-18 LAB — CULTURE, BLOOD (ROUTINE X 2)
Culture  Setup Time: 201306050144
Culture  Setup Time: 201306050144

## 2011-09-18 LAB — CREATININE, SERUM
Creatinine, Ser: 0.94 mg/dL (ref 0.50–1.35)
GFR calc Af Amer: >90 mL/min
GFR calc non Af Amer: >90 mL/min

## 2011-09-18 LAB — CBC
HCT: 33.9 % — ABNORMAL LOW (ref 39.0–52.0)
MCHC: 35.1 g/dL (ref 30.0–36.0)
MCV: 86.7 fL (ref 78.0–100.0)
RDW: 21.9 % — ABNORMAL HIGH (ref 11.5–15.5)

## 2011-09-18 MED ORDER — LACTULOSE 10 GM/15ML PO SOLN: 20.0000 g | Freq: Every day | ORAL | Status: DC | PRN

## 2011-09-18 MED ORDER — FENTANYL CITRATE 0.05 MG/ML IJ SOLN: 50.0000 ug | INTRAMUSCULAR | Status: DC | PRN

## 2011-09-18 MED ORDER — OXYCODONE HCL 40 MG PO TB12
80.0000 mg | ORAL_TABLET | Freq: Two times a day (BID) | ORAL | Status: DC
  Administered 2011-09-18 – 2011-09-19 (×3): 80 mg via ORAL
  Filled 2011-09-18: qty 2

## 2011-09-18 MED ORDER — ENSURE COMPLETE PO LIQD
237.0000 mL | Freq: Four times a day (QID) | ORAL | Status: DC
  Administered 2011-09-18 – 2011-09-24 (×15): 237 mL via ORAL

## 2011-09-18 MED ORDER — OXYCODONE HCL 5 MG PO TABS
30.0000 mg | ORAL_TABLET | ORAL | Status: DC | PRN
  Administered 2011-09-21 – 2011-09-23 (×2): 30 mg via ORAL
  Filled 2011-09-18 (×2): qty 6

## 2011-09-18 MED ORDER — KETOROLAC TROMETHAMINE 30 MG/ML IJ SOLN
30.0000 mg | Freq: Once | INTRAMUSCULAR | Status: AC
  Administered 2011-09-18: 30 mg via INTRAVENOUS
  Filled 2011-09-18: qty 1

## 2011-09-18 MED ORDER — VITAMINS A & D EX OINT
TOPICAL_OINTMENT | CUTANEOUS | Status: AC
  Filled 2011-09-18: qty 5

## 2011-09-18 MED ORDER — FENTANYL CITRATE 0.05 MG/ML IJ SOLN
25.0000 ug | INTRAMUSCULAR | Status: DC | PRN
  Administered 2011-09-18 – 2011-09-23 (×46): 25 ug via INTRAVENOUS
  Filled 2011-09-18 (×49): qty 2

## 2011-09-18 NOTE — Progress Notes (Signed)
Asked to do 700cc exchg transfusion for pt with dual lumen picc line;  IV Team attempted 500cc exchg yesterday (Monday) but was only able to remove 300cc;   Both ports of picc line flushing hard today;  Unable to get good bld return from either port, only about 4cc;  RN aware and is notifying the MD;    Barkley Bruns RN [facility]  IV Team

## 2011-09-18 NOTE — Progress Notes (Signed)
[name] is still having pain in his right shoulder and back. We did do an MRI of his left hip. There is no avascular necrosis. 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 Unfortunately not much we can do about this.  I suspect that he has the same issues with his right shoulder and back.  His hemoglobin 11.9. We'll do one more exchange on him today. We'll try to take out 700 cc of blood and then put back in 1 unit.  Is not itching. Hopefully the Emend that we gave him helped with his pruritus. He currently is on Neurontin.  Am I sure why the OxyContin was stopped. This might also explain his of persistent pain. We'll get him back on OxyContin.  He's had no fever. All his vital signs are stable. His lungs are clear. Cardiac exam regular and rhythm with no murmurs rubs or bruits. Abdominal exam soft. Has no fluid wave. There is no palpable hepatosplenomegaly. Neurological exam shows no focal neurological deficits.  Hopefully, he'll be ready to go home this week sometime. For [name], the sickle crises, when he gets them, typically take a good 10-14 days to resolve.  [name]

## 2011-09-18 NOTE — Progress Notes (Signed)
Subjective: No new issues.  Objective: Vital signs in last 24 hours: Temp:  [98 F (36.7 C)-100.8 F (38.2 C)] 98 F (36.7 C) (06/11 1345) Pulse Rate:  [55-73] 55  (06/11 1345) Resp:  [12-20] 16  (06/11 1345) BP: (117-143)/(64-82) 120/76 mmHg (06/11 1345) SpO2:  [93 %-99 %] 98 % (06/11 1345) Weight change:  Last BM Date: 09/09/11  Intake/Output from previous day: 06/10 0701 - 06/11 0700 In: 710 [P.O.:360; Blood:350] Out: 1775 [Urine:1475] Total I/O In: 240 [P.O.:240] Out: 400 [Urine:400]   Physical Exam: General: Comfortable, alert, communicative, fully oriented, not short of breath at rest, no longer troubled by pruritus.  HEENT: Mild clinical pallor, no jaundice, no conjunctival injection or discharge. Hydration is fair.  NECK: Supple, JVP not seen, no carotid bruits, no palpable lymphadenopathy, no palpable goiter.  CHEST: Clinically clear to auscultation, no wheezes, no crackles.  HEART: Sounds 1 and 2 heard, normal, regular, no murmurs.  ABDOMEN: Full, soft, non-tender, no palpable organomegaly, no palpable masses, normal bowel sounds.  GENITALIA: Not examined.  LOWER EXTREMITIES: No pitting edema, palpable peripheral pulses.  MUSCULOSKELETAL SYSTEM: Unremarkable.  CENTRAL NERVOUS SYSTEM: No focal neurologic deficit on gross examination   Lab Results:  Basename 09/18/11 0645 09/17/11 0400  WBC 9.9 8.7  HGB 11.9* 10.7*  HCT 33.9* 30.6*  PLT 224 189    Basename 09/18/11 0645 09/17/11 0400  NA -- 138  K -- 4.1  CL -- 102  CO2 -- 27  GLUCOSE -- 98  BUN -- 10  CREATININE 0.94 0.79  CALCIUM -- 9.0   Recent Results (from the past 240 hour(s))  CULTURE, BLOOD (ROUTINE X 2)     Status: Normal   Collection Time   09/11/11  9:25 AM      Component Value Range Status Comment   Specimen Description BLOOD RIGHT WRIST   Final    Special Requests BOTTLES DRAWN AEROBIC AND ANAEROBIC [facility]   Final    Culture  Setup Time [id number]   Final    Culture NO GROWTH 5  DAYS   Final    Report Status 09/18/2011 FINAL   Final   CULTURE, BLOOD (ROUTINE X 2)     Status: Normal   Collection Time   09/11/11  9:30 AM      Component Value Range Status Comment   Specimen Description BLOOD RIGHT HAND   Final    Special Requests BOTTLES DRAWN AEROBIC ONLY 4CC   Final    Culture  Setup Time [id number]   Final    Culture NO GROWTH 5 DAYS   Final    Report Status 09/18/2011 FINAL   Final   MRSA PCR SCREENING     Status: Abnormal   Collection Time   09/11/11  2:45 PM      Component Value Range Status Comment   MRSA by PCR POSITIVE (*) NEGATIVE  Final      Studies/Results: Dg Hip Complete Left  09/16/2011  *RADIOLOGY REPORT*  Clinical Data: Left hip pain.  Sickle cell anemia.  LEFT HIP - COMPLETE 2+ VIEW  Comparison: Radiographs 02/28/2010.  Findings: Diffuse slightly heterogeneous osteosclerosis is again noted consistent with sickle cell anemia.  A subchondral lucency laterally in the left femoral head is stable.  There is no subchondral collapse or significant joint space loss.  Right pelvic phleboliths appear unchanged. Oval densities overlapping the lower sacrum overlap the rectal air column and may be within the rectum. Bladder calculi are difficult to exclude.  IMPRESSION: Stable appearance of the left femoral head with stable subchondral lucency and generalized osteosclerosis. No acute osseous findings identified.  New oval densities in the pelvis may be within the rectum, although bladder calculi are difficult to exclude.  Original Report Authenticated By: Gerrianne Scale, M.D.   Mr Hip Left Wo Contrast  09/17/2011  *RADIOLOGY REPORT*  Clinical Data:  Sickle disease patient.  Chronic left hip pain. Question avascular necrosis.  MRI OF THE LEFT HIP WITHOUT CONTRAST  Technique:  Multiplanar, multisequence MR imaging was performed. No intravenous contrast was administered.  Comparison: Plain films left hip 09/16/2011 and 02/28/2010. CT abdomen and pelvis 01/15/2005.   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 Infarction is present in both femoral heads, larger on the left, with associated marrow edema. Left greater than right hip joint effusions are also seen consistent with synovitis related to bone infarcts.  The patient's left femoral head infarct is along the superior margin of the femoral head with some flattening of the periphery.  Right femoral head infarct involves the medial aspect of the articular surface.  Additional infarcts are seen involving the greater trochanters bilaterally, larger on the right.  Sacral infarction and lower lumbar spine infarcts with associated vertebral body height loss are also identified.  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  Imaged intrapelvic contents are unremarkable.  IMPRESSION:  1.  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  Small hip joint effusions are compatible with synovitis, larger on the left. 2.  Diffuse marrow signal abnormality consistent with the presence of multifocal bone infarcts and the presence of active marrow. 3.  Shortened appearance of the femoral necks and bilaterally with coxa magna deformities, unchanged.  Original Report Authenticated By: Bernadene Bell. Maricela Curet, M.D.   Dg Chest Port 1 View  09/17/2011  *RADIOLOGY REPORT*  Clinical Data: Line placement.  PORTABLE CHEST - 1 VIEW  Comparison: 09/12/2011.  Findings: Trachea is midline.  Heart is mildly enlarged.  Left PICC tip projects over the SVC.  Left IJ central line has been removed. Lungs are low in volume.  No pleural fluid.  IMPRESSION: Low lung volumes.  No acute findings.  Original Report Authenticated By: Reyes Ivan, M.D.    Medications: Scheduled Meds:    . acetaminophen  650 mg Oral Once  . antiseptic oral rinse  15 mL Mouth Rinse BID  . diphenhydrAMINE  25 mg Intravenous Once   . diphenhydrAMINE  25 mg Intravenous Once  . enoxaparin  40 mg Subcutaneous Q24H  . famotidine  20 mg Oral BID  . feeding supplement  237 mL Oral QID  . folic acid  1 mg Oral Daily  . gabapentin  300 mg Oral TID  . hydroxyurea  1,000 mg Oral Daily  . ibuprofen  600 mg Oral TID WC  . ketorolac  30 mg Intravenous Once  . multivitamin with minerals  1 tablet Oral Daily  . oxyCODONE  80 mg Oral Q12H  . oxyCODONE  80 mg Oral Q12H  . polyethylene glycol  17 g Oral Daily  . sodium chloride  3 mL Intravenous Q12H  . vitamin A & D      . DISCONTD: lactulose  20 g Oral Q4H   Continuous Infusions:    . sodium chloride 50 mL/hr at 09/18/11 0941   PRN Meds:.acetaminophen, camphor-menthol, diphenhydrAMINE, diphenhydrAMINE, fentaNYL, hydrOXYzine, lactulose, oxyCODONE,  sodium chloride, DISCONTD: fentaNYL, DISCONTD: fentaNYL, DISCONTD: oxyCODONE  Assessment/Plan:  Active Problems:  1. Sickle cell anemia with crisis: Patient presented in acute pain crisis, with pattern of pain, following the usual pattern of his crises. No obvious precipitant. Managing with iv fluids, analgesics, Folate and Hydroxyurea. Choice of pain medication is proving problematic, due to patient's allergy and intolerance, to Morphine and Dilaudid. Currently on prn Fentanyl, Oxycontin, and prn OxyIR. But previously, has required Toradol and Nalbuphine. Dr Arlan Organ, provided hematology consultation, and recommended total EBT, which was commenced on 09/12/11, with satisfactory results. Because of problematic iv access, EBT was not done on 09/14/11. Instead, a single unit of PRBC was transfused. Emend was added to Rx by Dr Myna Hidalgo, ad this seems to be effective for patient's pruritus. Dr Darnelle Catalan added Gabapentin for additional pain control. Left hip X-Ray of 09/16/11, showed stable appearance of the left femoral head with stable subchondral lucency and generalized osteosclerosis. No acute osseous findings identified. MRI on 09/17/11,  demonstrated bone infarcts in both femoral heads with associated marrow edema, larger on the left. Small hip joint effusions are compatible with synovitis, larger on the left. Diffuse marrow signal abnormality consistent with the presence of multifocal bone infarcts and the presence of active marrow. Perhaps, NSAIDS will be helpful. Have added Motrin to Rx. Otherwise, managing per hematology. Patient is due for another exachnge blood transfusion today.  2. Acute respiratory failure with hypercapnia/Acute chest syndrome due to sickle cell crisis: Patient was initially admitted to [facility] on 09/10/11, then tranfered to [facility] on 09/11/11, due to worsening clinical status, with Hypercarbia and respiratory acidosis.  Temperature was 101.9, chest x-ray from 09/10/11, showed no focal airspace opacities however the chest x-ray from 09/11/11, showed decreased lung volumes with bibasilar opacities/atelectasis. He also had a significant leukocytosis of 20.8. He was initially managed with Vancomycin/Zosyn, but later changed to Cefepime/Levaquin,(day#2 on 09/12/11). Temperature reached a high of 102.9, but patient has since defervesced, wcc appears to have plateaued and Oxygen requirements are less. ABG of 09/12/11, showed PH 7.327, PCO2 46.7, PO2 87.5 and Bicarb 23.8. This is improved. CXR showed no consolidative airspace disease, although there was suspicion of mild interstitial edema. Patent is now saturating at 93%-99% on room air. Cefepime was discontinued on 09/12/11, by the critical care team. 7-day course of Levaquin was completed on 09/17/11. CXR of 09/17/11, showed Low lung volumes. No acute findings.  3. Constipation: Secondary to opioid analgesics. On bowel regimen.  Comment: For discharge, when stable. Disposition is per hematologist.   LOS: 7 days   Gabriellia Rempel,CHRISTOPHER 09/18/2011, 1:53 PM

## 2011-09-19 ENCOUNTER — Inpatient Hospital Stay (HOSPITAL_COMMUNITY): Payer: Medicare Other

## 2011-09-19 DIAGNOSIS — D5700 Hb-SS disease with crisis, unspecified: Secondary | ICD-10-CM

## 2011-09-19 DIAGNOSIS — R509 Fever, unspecified: Secondary | ICD-10-CM

## 2011-09-19 DIAGNOSIS — J9600 Acute respiratory failure, unspecified whether with hypoxia or hypercapnia: Secondary | ICD-10-CM

## 2011-09-19 DIAGNOSIS — D7289 Other specified disorders of white blood cells: Secondary | ICD-10-CM

## 2011-09-19 LAB — BASIC METABOLIC PANEL
CO2: 26 mEq/L (ref 19–32)
Calcium: 8.7 mg/dL (ref 8.4–10.5)
Creatinine, Ser: 0.78 mg/dL (ref 0.50–1.35)
GFR calc Af Amer: >90 mL/min (ref >90)

## 2011-09-19 LAB — CBC
MCH: 30.3 pg (ref 26.0–34.0)
MCV: 87.9 fL (ref 78.0–100.0)
Platelets: 258 10*3/uL (ref 150–400)
RDW: 21.3 % — ABNORMAL HIGH (ref 11.5–15.5)

## 2011-09-19 MED ORDER — OXYCODONE HCL 40 MG PO TB12
100.0000 mg | ORAL_TABLET | Freq: Two times a day (BID) | ORAL | Status: DC
  Administered 2011-09-19 – 2011-09-20 (×2): 100 mg via ORAL
  Filled 2011-09-19 (×3): qty 2
  Filled 2011-09-19: qty 3

## 2011-09-19 MED ORDER — ENOXAPARIN SODIUM 100 MG/ML ~~LOC~~ SOLN
100.0000 mg | SUBCUTANEOUS | Status: DC
  Administered 2011-09-19 – 2011-09-23 (×5): 100 mg via SUBCUTANEOUS
  Filled 2011-09-19 (×5): qty 1
  Filled 2011-09-19: qty 1.2

## 2011-09-19 NOTE — Progress Notes (Signed)
[name] still is having issues with his right shoulder. Unfortunately, the PICC line is not able to be utilized for adequate exchange transfusion. His hemoglobin is 11.5. His sickle cell should be fairly dilute by now with his recent exchange transfusions.  I will put him on therapeutic Lovenox right now. There've been some studies that have shown that Lovenox at a therapeutic dose can and increase the recovery from sickle cell crisis.  Does complain of some chest discomfort. I will get a chest x-ray on him. He's had no cough. There's no bleeding. His appetite is all right.  I implored him to be very diligent with his incentive spirometer.  His vital signs are all stable. Oxygen saturation is 97%. His lungs show some scattered crackles bilaterally. He has good air movement by laterally. Cardiac exam regular rate and rhythm with no murmurs rubs or bruits. Abdominal exam soft with good bowel sounds. There is no fluid wave. There is no abnormal hepatospleno megaly extremities shows him some tenderness in the right shoulder area. He says tenderness to palpation over the long bones.  His labs show hemoglobin 11.5 hematocrit 33.3. Weisel count 6.9. He has a normal BUN and creatinine.  His the MRI left hip shows bony infarcts but no avascular necrosis.  Hopefully,the therapeutic Lovenox can help with his sickle pain. I'm not sure if continuing with exchanging him will be of any further benefit.  Hopefully, we can still plan for discharge later this week or over the weekend.   [name] E.

## 2011-09-19 NOTE — Progress Notes (Signed)
PROGRESS NOTE  [name] ZOX:[id number] DOB: [birth year] DOA: 09/11/2011 PCP: Billee Cashing, MD  Brief narrative: 31 y/o AAM admitted 6/4 with Acute vaso-occlusive crisis and high-grade temperature.    Past medical history: Cholecystectomy 01/2004 Avascular necrosis R humeral head elevated troponin 07/13/10 admit Diastolic dysfunction per Cardiac cath 07/13/10 Significant pruritis S/p Exchange transfusions (?) 4 x in the past  Consultants:  Oncology/Hematology-Dr. Myna Hidalgo  Procedures:  Central line placed 09/12/11  CXR 6/4=decreaaed ling vol+ Bibasilar opacities L>R  MRI L hip Bony infarct both femoral heads-small Hip jopint effusions.  Diffuse Marrow signal anomaly noted.  Relatively unchanged imaging from prior  CXR 6/10=no acute findings  Antibiotics:  Cefepime 6/4-6/5   levofloxacin 6/4>>  Zosyn 6/4  Vancomycin 6/4   Subjective  States he is having "terrible" pain in chest and right shoulder. Usually has significant pain in left shoulder from avascular necrosis. No nausea no vomiting No blurred vision no double vision no dark stool no tarry stool. States feeling somewhat short of breath.  Itching is a little better.   Objective    Interim History: Reviewed.  Appreciate Oncology input  Subjective: See ONC notes-cannot access PICC line to get good blood return therefore no  Objective: Filed Vitals:   09/18/11 1816 09/18/11 2206 09/19/11 0419 09/19/11 0942  BP: 120/73 118/74 113/66 129/75  Pulse: 63 58 86 62  Temp: 97.7 F (36.5 C) 97.6 F (36.4 C) 98.2 F (36.8 C)   TempSrc: Oral Oral Oral   Resp: 18 18 18    Height:      Weight:      SpO2: 95% 96% 97% 95%    Intake/Output Summary (Last 24 hours) at 09/19/11 0948 Last data filed at 09/19/11 0732  Gross per 24 hour  Intake    480 ml  Output   1900 ml  Net  -1420 ml    Exam:  General: Alert pleasant African American male in no apparent painful distress Cardiovascular: S1-S2 no murmur rub  or gallop Respiratory: Clinically clear no added sound, some Rales heard occasionally however. No TVR no TVF Abdomen: Soft nontender nondistended Skin excoriations skin and to nose Neuro grossly intact and moving 4 limbs equally  Data Reviewed: Basic Metabolic Panel:  Lab 09/19/11 8119 09/18/11 0645 09/17/11 0400 09/15/11 0410 09/14/11 0500 09/13/11 0407  NA 138 -- 138 141 142 140  K 4.1 -- 4.1 -- -- --  CL 106 -- 102 105 108 109  CO2 26 -- 27 27 28 27   GLUCOSE 95 -- 98 93 100* 108*  BUN 12 -- 10 14 10 18   CREATININE 0.78 0.94 0.79 0.95 0.92 --  CALCIUM 8.7 -- 9.0 8.8 9.0 8.8  MG -- -- -- -- -- --  PHOS -- -- -- -- -- --   Liver Function Tests: No results found for this basename: AST:5,ALT:5,ALKPHOS:5,BILITOT:5,PROT:5,ALBUMIN:5 in the last 168 hours No results found for this basename: LIPASE:5,AMYLASE:5 in the last 168 hours No results found for this basename: AMMONIA:5 in the last 168 hours CBC:  Lab 09/19/11 0600 09/18/11 0645 09/17/11 0400 09/16/11 0411 09/15/11 0410  WBC 6.9 9.9 8.7 10.6* 11.3*  NEUTROABS -- -- -- -- --  HGB 11.5* 11.9* 10.7* 11.3* 11.0*  HCT 33.3* 33.9* 30.6* 31.6* 30.2*  MCV 87.9 86.7 86.2 84.9 82.3  PLT 258 224 189 188 199   Cardiac Enzymes: No results found for this basename: CKTOTAL:5,CKMB:5,CKMBINDEX:5,TROPONINI:5 in the last 168 hours BNP: No components found with this basename: POCBNP:5 CBG: No results  found for this basename: GLUCAP:5 in the last 168 hours  Recent Results (from the past 240 hour(s))  CULTURE, BLOOD (ROUTINE X 2)     Status: Normal   Collection Time   09/11/11  9:25 AM      Component Value Range Status Comment   Specimen Description BLOOD RIGHT WRIST   Final    Special Requests BOTTLES DRAWN AEROBIC AND ANAEROBIC [facility]   Final    Culture  Setup Time [id number]   Final    Culture NO GROWTH 5 DAYS   Final    Report Status 09/18/2011 FINAL   Final   CULTURE, BLOOD (ROUTINE X 2)     Status: Normal   Collection Time    09/11/11  9:30 AM      Component Value Range Status Comment   Specimen Description BLOOD RIGHT HAND   Final    Special Requests BOTTLES DRAWN AEROBIC ONLY 4CC   Final    Culture  Setup Time [id number]   Final    Culture NO GROWTH 5 DAYS   Final    Report Status 09/18/2011 FINAL   Final   MRSA PCR SCREENING     Status: Abnormal   Collection Time   09/11/11  2:45 PM      Component Value Range Status Comment   MRSA by PCR POSITIVE (*) NEGATIVE Final      Studies:              All Imaging reviewed and is as per above notation   Scheduled Meds:   . acetaminophen  650 mg Oral Once  . antiseptic oral rinse  15 mL Mouth Rinse BID  . enoxaparin  100 mg Subcutaneous Q24H  . famotidine  20 mg Oral BID  . feeding supplement  237 mL Oral QID  . folic acid  1 mg Oral Daily  . hydroxyurea  1,000 mg Oral Daily  . ibuprofen  600 mg Oral TID WC  . multivitamin with minerals  1 tablet Oral Daily  . oxyCODONE  80 mg Oral Q12H  . polyethylene glycol  17 g Oral Daily  . sodium chloride  3 mL Intravenous Q12H  . vitamin A & D      . DISCONTD: diphenhydrAMINE  25 mg Intravenous Once  . DISCONTD: diphenhydrAMINE  25 mg Intravenous Once  . DISCONTD: enoxaparin  40 mg Subcutaneous Q24H  . DISCONTD: oxyCODONE  80 mg Oral Q12H   Continuous Infusions:   . sodium chloride 50 mL/hr at 09/18/11 0941     Assessment/Plan: 1. Sickle cell anemia with crisis-continue pain meds for painful crisis-continue fentanyl 25 mcg every 2 when necessary, ibuprofen 600 3 times a day with meals, OxyIR 30 mg every 4 when necessary. I've increased his OxyContin SR to 100 mg from 80 mg every 12 hourly today. Hopefully patient can get better and will be able to be tapered off of the IV pain medications in a couple of days. This would be indicated to by his when necessary fentanyl use.  Patient will receive hydroxyurea 1000 mg, folate acid 1 mg daily, hydroxyzine 50 mg every 4 when necessary for itching+ Benadryl 25 mg IV/by  mouth every 4 when necessary-PICC line not in optimal position-will ask IV team to reposition and hopefully get xchange transfusion can occur 2. Icterus-Emend/Hydroxyzine/Gabapentin on board 3. Multiple areas of # on MR of hips +Synovitis-on Motrin in addition to multiple Opiates.  Will d/c if no discernible effect. 4. Hypercarbia with  acute respiratory failure-resolved. Patient initially admitted to [facility] on to 6/3 and transferred 6/4 just a worsening clinical status with hypercarbia respiratory acidosis and temperature 101.9.  Chest x-ray 6/10 shows no significant pneumonia currently.  Received appropriate Abx as per above however displaced PICC line-will discontinue Levaquin 5. Constipation-currently on lactulose 20mg  daily. Passing stool.  Code Status: FULL Family Communication: None at bedside Disposition Plan: ?Exchange transfusion once PICC re-aligned.  Per ONcologist   Pleas Koch, MD  [facility] Pager [phone] 09/19/2011, 9:48 AM    LOS: 8 days

## 2011-09-19 NOTE — Progress Notes (Signed)
Chest xray from this am with L upper arm DL PICC noted to be in RIJ. Patient positioned fowler position and both lumens power flushed with NS 10 ml x 2. Patient to remain fowler position and stat portable chest xray ordered for 1225.

## 2011-09-20 DIAGNOSIS — D5700 Hb-SS disease with crisis, unspecified: Secondary | ICD-10-CM

## 2011-09-20 DIAGNOSIS — J9600 Acute respiratory failure, unspecified whether with hypoxia or hypercapnia: Secondary | ICD-10-CM

## 2011-09-20 DIAGNOSIS — R509 Fever, unspecified: Secondary | ICD-10-CM

## 2011-09-20 DIAGNOSIS — D7289 Other specified disorders of white blood cells: Secondary | ICD-10-CM

## 2011-09-20 LAB — COMPREHENSIVE METABOLIC PANEL
AST: 72 U/L — ABNORMAL HIGH (ref 0–37)
Albumin: 2.9 g/dL — ABNORMAL LOW (ref 3.5–5.2)
Calcium: 9.1 mg/dL (ref 8.4–10.5)
Chloride: 102 mEq/L (ref 96–112)
Creatinine, Ser: 0.8 mg/dL (ref 0.50–1.35)
Total Bilirubin: 1 mg/dL (ref 0.3–1.2)
Total Protein: 6.9 g/dL (ref 6.0–8.3)

## 2011-09-20 LAB — CBC
MCV: 88.4 fL (ref 78.0–100.0)
Platelets: 302 10*3/uL (ref 150–400)
RDW: 20.7 % — ABNORMAL HIGH (ref 11.5–15.5)
WBC: 6.8 10*3/uL (ref 4.0–10.5)

## 2011-09-20 MED ORDER — OXYCODONE HCL 40 MG PO TB12
80.0000 mg | ORAL_TABLET | Freq: Two times a day (BID) | ORAL | Status: DC
  Administered 2011-09-20 – 2011-09-23 (×6): 80 mg via ORAL
  Filled 2011-09-20 (×6): qty 2

## 2011-09-20 MED ORDER — FUROSEMIDE 10 MG/ML IJ SOLN
20.0000 mg | Freq: Once | INTRAMUSCULAR | Status: AC
  Administered 2011-09-21: 20 mg via INTRAVENOUS
  Filled 2011-09-20 (×2): qty 2

## 2011-09-20 MED ORDER — OXYCODONE HCL 20 MG PO TB12
20.0000 mg | ORAL_TABLET | Freq: Two times a day (BID) | ORAL | Status: DC
  Administered 2011-09-20 – 2011-09-23 (×6): 20 mg via ORAL
  Filled 2011-09-20 (×6): qty 1

## 2011-09-20 NOTE — Progress Notes (Signed)
[name] is doing a little better today. The chest x-ray did show that his PICC line was malpositioned. This has been re-positioned. We will try to do an exchange transfusion on him today. Hopefully the PICC line will cooperate.  Him on therapeutic Lovenox. This may also be helpful with respect to minimize in his sickle issues.  He's had no nausea vomiting. He's afebrile. He is out of bed. He denies constipation.  His hemoglobin 11.1 today. Again we can clearly take out of a unit and put back a unit.  His physical exam is pretty much unchanged. His lungs are clear. Cardiac exam regular rate and rhythm with a normal S1 and S2. Up abdominal exam soft with good bowel sounds. There is no palpable hepato-megaly. Spleen tip is not palpable. Extremities shows some no clubbing cyanosis or edema. Still some mild tenderness over in the right shoulder left hip.  Am still hoping that he'll be able to go home by or over the weekend.   [name]

## 2011-09-20 NOTE — Progress Notes (Signed)
PROGRESS NOTE  [name] OVF:[id number] DOB: 1980/11/18 DOA: 09/11/2011 PCP: Billee Cashing, MD  Brief narrative: 31 y/o AAM admitted 6/4 with Acute vaso-occlusive crisis and high-grade temperature.    Past medical history: Cholecystectomy 01/2004 Avascular necrosis R humeral head elevated troponin 07/13/10 admit Diastolic dysfunction per Cardiac cath 07/13/10 Significant pruritis S/p Exchange transfusions (?) 4 x in the past  Consultants:  Oncology/Hematology-Dr. Myna Hidalgo  Procedures:  Central line placed 09/12/11  CXR 6/4=decreaaed ling vol+ Bibasilar opacities L>R  MRI L hip Bony infarct both femoral heads-small Hip jopint effusions.  Diffuse Marrow signal anomaly noted.  Relatively unchanged imaging from prior  CXR 6/10=no acute findings  Antibiotics:  Cefepime 6/4-6/5   levofloxacin 6/4>> 6/13  Zosyn 6/4  Vancomycin 6/4   Subjective  States pain is 8 on 10 but patient lying comfortably in bed. Usually has significant pain in left shoulder from avascular necrosis. No nausea no vomiting No blurred vision no double vision no dark stool no tarry stool.   Itching is a little better.   Objective    Interim History: Reviewed.  Appreciate Oncology input PICC line was repositioned yesterday and is working correctly  Subjective: See ONC notes-4 exchange transfusion today  Objective: Filed Vitals:   09/20/11 0955 09/20/11 1351 09/20/11 1500 09/20/11 1624  BP: 132/74 132/67 132/74 105/56  Pulse: 63 61 70 82  Temp: 99.6 F (37.6 C) 98.6 F (37 C) 98.6 F (37 C) 98.2 F (36.8 C)  TempSrc: Oral Oral Oral Oral  Resp: 19 19 18 18   Height:      Weight:      SpO2: 96% 96% 96% 96%    Intake/Output Summary (Last 24 hours) at 09/20/11 1732 Last data filed at 09/20/11 1633  Gross per 24 hour  Intake    350 ml  Output   2525 ml  Net  -2175 ml    Exam:  General: Alert pleasant African American male in no apparent painful distress Cardiovascular: S1-S2 no  murmur rub or gallop Respiratory: Clinically clear no added sound, some Rales heard occasionally however. No TVR no TVF Abdomen: Soft nontender nondistended Skin excoriations skin and to nose Neuro grossly intact and moving 4 limbs equally  Data Reviewed: Basic Metabolic Panel:  Lab 09/20/11 8416 09/19/11 0600 09/18/11 0645 09/17/11 0400 09/15/11 0410 09/14/11 0500  NA 137 138 -- 138 141 142  K 4.0 4.1 -- -- -- --  CL 102 106 -- 102 105 108  CO2 28 26 -- 27 27 28   GLUCOSE 95 95 -- 98 93 100*  BUN 10 12 -- 10 14 10   CREATININE 0.80 0.78 0.94 0.79 0.95 --  CALCIUM 9.1 8.7 -- 9.0 8.8 9.0  MG -- -- -- -- -- --  PHOS -- -- -- -- -- --   Liver Function Tests:  Lab 09/20/11 0510  AST 72*  ALT 76*  ALKPHOS 187*  BILITOT 1.0  PROT 6.9  ALBUMIN 2.9*   No results found for this basename: LIPASE:5,AMYLASE:5 in the last 168 hours No results found for this basename: AMMONIA:5 in the last 168 hours CBC:  Lab 09/20/11 0510 09/19/11 0600 09/18/11 0645 09/17/11 0400 09/16/11 0411  WBC 6.8 6.9 9.9 8.7 10.6*  NEUTROABS -- -- -- -- --  HGB 11.1* 11.5* 11.9* 10.7* 11.3*  HCT 32.7* 33.3* 33.9* 30.6* 31.6*  MCV 88.4 87.9 86.7 86.2 84.9  PLT 302 258 224 189 188   Cardiac Enzymes: No results found for this basename: CKTOTAL:5,CKMB:5,CKMBINDEX:5,TROPONINI:5 in the  last 168 hours BNP: No components found with this basename: POCBNP:5 CBG: No results found for this basename: GLUCAP:5 in the last 168 hours  Recent Results (from the past 240 hour(s))  CULTURE, BLOOD (ROUTINE X 2)     Status: Normal   Collection Time   09/11/11  9:25 AM      Component Value Range Status Comment   Specimen Description BLOOD RIGHT WRIST   Final    Special Requests BOTTLES DRAWN AEROBIC AND ANAEROBIC [facility]   Final    Culture  Setup Time [id number]   Final    Culture NO GROWTH 5 DAYS   Final    Report Status 09/18/2011 FINAL   Final   CULTURE, BLOOD (ROUTINE X 2)     Status: Normal   Collection Time    09/11/11  9:30 AM      Component Value Range Status Comment   Specimen Description BLOOD RIGHT HAND   Final    Special Requests BOTTLES DRAWN AEROBIC ONLY 4CC   Final    Culture  Setup Time [id number]   Final    Culture NO GROWTH 5 DAYS   Final    Report Status 09/18/2011 FINAL   Final   MRSA PCR SCREENING     Status: Abnormal   Collection Time   09/11/11  2:45 PM      Component Value Range Status Comment   MRSA by PCR POSITIVE (*) NEGATIVE Final      Studies:              All Imaging reviewed and is as per above notation   Scheduled Meds:    . acetaminophen  650 mg Oral Once  . antiseptic oral rinse  15 mL Mouth Rinse BID  . enoxaparin  100 mg Subcutaneous Q24H  . feeding supplement  237 mL Oral QID  . folic acid  1 mg Oral Daily  . furosemide  20 mg Intravenous Once  . hydroxyurea  1,000 mg Oral Daily  . ibuprofen  600 mg Oral TID WC  . multivitamin with minerals  1 tablet Oral Daily  . oxyCODONE  100 mg Oral Q12H  . polyethylene glycol  17 g Oral Daily  . sodium chloride  3 mL Intravenous Q12H   Continuous Infusions:    . sodium chloride 125 mL/hr at 09/20/11 0539     Assessment/Plan: 1. Sickle cell anemia with crisis-continue pain meds for painful crisis-continue fentanyl 25 mcg every 2 when necessary, ibuprofen 600 3 times a day with meals, OxyIR 30 mg every 4 when necessary. I've increased his OxyContin SR to 100 mg from 80 mg every 12 hourly today. Hopefully patient can get better and will be able to be tapered off of the IV pain medications in a couple of days. This would be indicated to by his decreased when necessary fentanyl use.  Patient will receive hydroxyurea 1000 mg, folate acid 1 mg daily, hydroxyzine 50 mg every 4 when necessary for itching+ Benadryl 25 mg IV/by mouth every 4 when necessary-PICC line not in optimal position-Dr. Myna Hidalgo is coordinating his exchange transfusion-usually this makes him feel better in a couple of  days 2. Icterus-Emend/Hydroxyzine/Gabapentin on board 3. Multiple areas of # on MR of hips +Synovitis-on Motrin in addition to multiple Opiates.  Will d/c if no discernible effect. 4. Hypercarbia with acute respiratory failure-resolved. Patient initially admitted to [facility] on to 6/3 and transferred 6/4 just a worsening clinical status with hypercarbia respiratory acidosis and temperature  101.9.  Chest x-ray 6/10 shows no significant pneumonia currently.  Received appropriate Abx as per above however displaced PICC line-will discontinue Levaquin-patient afebrile and does not need further antibiotics 5. Constipation-currently on lactulose 20mg  daily. Passing stool.  Code Status: FULL Family Communication: None at bedside Disposition Plan:   Per Oncologist   Pleas Koch, MD  [facility] Pager [phone] 09/20/2011, 5:32 PM    LOS: 9 days

## 2011-09-21 DIAGNOSIS — J9600 Acute respiratory failure, unspecified whether with hypoxia or hypercapnia: Secondary | ICD-10-CM

## 2011-09-21 DIAGNOSIS — D5700 Hb-SS disease with crisis, unspecified: Secondary | ICD-10-CM

## 2011-09-21 DIAGNOSIS — R509 Fever, unspecified: Secondary | ICD-10-CM

## 2011-09-21 DIAGNOSIS — D7289 Other specified disorders of white blood cells: Secondary | ICD-10-CM

## 2011-09-21 LAB — PREPARE RBC (CROSSMATCH)

## 2011-09-21 LAB — TYPE AND SCREEN
Antibody Screen: NEGATIVE
Unit division: 0

## 2011-09-21 MED ORDER — IBUPROFEN 800 MG PO TABS
800.0000 mg | ORAL_TABLET | Freq: Three times a day (TID) | ORAL | Status: DC
  Administered 2011-09-21 – 2011-09-24 (×9): 800 mg via ORAL
  Filled 2011-09-21 (×10): qty 1

## 2011-09-21 NOTE — Progress Notes (Signed)
Mr. Ellender continues to improve.  He is now on therapeutic-dose Lovenox.  He is also getting exchange transfusions.  With his PICC line repositioned, the exchange went better yesterday.  He is complaining of just some pain in the right shoulder now.  He has fairly decent range of motion.  There is no fever.  He is out of bed.  He is not having any problems with bowels or bladder.  There is no nausea or vomiting.  He is having no cough.  PHYSICAL EXAMINATION:  General:  This is a well-developed, well- nourished black gentleman in no obvious distress.  Vital Signs:  Showed temperature 98.3 pulse 58, respiratory rate 18, blood pressure 117/71. Head and Neck Exam:  Shows no scleral icterus.  There are no oral lesions.  There is no adenopathy in the neck.  Lungs:  Clear bilaterally.  Cardiac Exam:  Regular rate and rhythm with a normal S1 and S2.  There are no murmurs, rubs, or bruits.  Abdominal Exam:  Soft with good bowel sounds.  There is no fluid wave.  There is no palpable hepatosplenomegaly.  Extremities:  Show no clubbing, cyanosis, or edema. He has good range of motion of his joints.  Skin Exam:  No rashes, ecchymoses, or petechia.  LABORATORIES:  Not done today as of yet.  We will do 1 more exchange on Mr. Yono.  I will take off 500 mL of blood and give him back 1 unit of blood.  That should be more than adequate to dilute out his sickle cells.  I will continue him on therapeutic Lovenox for right now.  We will plan on hopefully getting him home over the weekend or at the latest early next week.    ______________________________ Josph Macho, M.D. PRE/MEDQ  D:  09/21/2011  T:  09/21/2011  Job:  [id number]

## 2011-09-21 NOTE — Progress Notes (Signed)
PROGRESS NOTE  [name]:[id number] DOB: 13-Jun-1980 DOA: 09/11/2011 PCP: Billee Cashing, MD  Brief narrative: 31 y/o AAM admitted 6/4 with Acute vaso-occlusive crisis and high-grade temperature.    Past medical history: Cholecystectomy 01/2004 Avascular necrosis R humeral head elevated troponin 07/13/10 admit Diastolic dysfunction per Cardiac cath 07/13/10 Significant pruritis S/p Exchange transfusions (?) 4 x in the past  Consultants:  Oncology/Hematology-Dr. Myna Hidalgo  Orthopedics-Phone consult with Dr. Otelia Sergeant for rec's  Procedures:  Central line placed 09/12/11  CXR 6/4=decreased ling vol+ Bibasilar opacities L>R  MRI L hip Bony infarct both femoral heads-small Hip joint effusions.  Diffuse Marrow signal anomaly noted.  Relatively unchanged imaging from prior  CXR 6/10=no acute findings  Antibiotics:  Cefepime 6/4-6/5   levofloxacin 6/4>> 6/13  Zosyn 6/4  Vancomycin 6/4   Subjective  States pain is 8/10--patient lying comfortably in bed. Usually has significant pain in left shoulder from avascular necrosis-never had Sx for this No nausea no vomiting no blurred vision no double vision no dark stool no tarry stool.   Objective    Interim History: Reviewed.  Appreciate Oncology input  Subjective: See ONC notes-had exchange transfusion last night and is supposed to have another today  Objective: Filed Vitals:   09/21/11 0151 09/21/11 0539 09/21/11 0945 09/21/11 1330  BP: 122/66 117/71 123/74 118/69  Pulse: 58 58 65 65  Temp: 98.2 F (36.8 C) 98.3 F (36.8 C) 98.5 F (36.9 C) 98.6 F (37 C)  TempSrc: Oral Oral Oral Oral  Resp: 16 18 18 18   Height:      Weight: 68 kg (149 lb 14.6 oz)     SpO2: 94% 95% 96% 96%    Intake/Output Summary (Last 24 hours) at 09/21/11 1626 Last data filed at 09/21/11 1330  Gross per 24 hour  Intake    630 ml  Output   4400 ml  Net  -3770 ml    Exam:  General: Alert pleasant African American male in no apparent  painful distress Cardiovascular: S1-S2 no murmur rub or gallop Respiratory: Clinically clear no added sound, some Rales heard occasionally however. No TVR no TVF Abdomen: Soft nontender nondistended Skin excoriations skin and to nose Neuro grossly intact and moving 4 limbs equally MSK-Able to do Neer's and Hawkins test.  Able to perfrom Empty can test and place hand behind his head.  Does have some pain in the R Ac joint area No redness, no warmth  Data Reviewed: Basic Metabolic Panel:  Lab 09/20/11 4696 09/19/11 0600 09/18/11 0645 09/17/11 0400 09/15/11 0410  NA 137 138 -- 138 141  K 4.0 4.1 -- -- --  CL 102 106 -- 102 105  CO2 28 26 -- 27 27  GLUCOSE 95 95 -- 98 93  BUN 10 12 -- 10 14  CREATININE 0.80 0.78 0.94 0.79 0.95  CALCIUM 9.1 8.7 -- 9.0 8.8  MG -- -- -- -- --  PHOS -- -- -- -- --   Liver Function Tests:  Lab 09/20/11 0510  AST 72*  ALT 76*  ALKPHOS 187*  BILITOT 1.0  PROT 6.9  ALBUMIN 2.9*   No results found for this basename: LIPASE:5,AMYLASE:5 in the last 168 hours No results found for this basename: AMMONIA:5 in the last 168 hours CBC:  Lab 09/20/11 0510 09/19/11 0600 09/18/11 0645 09/17/11 0400 09/16/11 0411  WBC 6.8 6.9 9.9 8.7 10.6*  NEUTROABS -- -- -- -- --  HGB 11.1* 11.5* 11.9* 10.7* 11.3*  HCT 32.7* 33.3* 33.9* 30.6*  31.6*  MCV 88.4 87.9 86.7 86.2 84.9  PLT 302 258 224 189 188   Cardiac Enzymes: No results found for this basename: CKTOTAL:5,CKMB:5,CKMBINDEX:5,TROPONINI:5 in the last 168 hours BNP: No components found with this basename: POCBNP:5 CBG: No results found for this basename: GLUCAP:5 in the last 168 hours  No results found for this or any previous visit (from the past 240 hour(s)).   Studies:              All Imaging reviewed and is as per above notation   Scheduled Meds:    . acetaminophen  650 mg Oral Once  . antiseptic oral rinse  15 mL Mouth Rinse BID  . enoxaparin  100 mg Subcutaneous Q24H  . feeding supplement  237  mL Oral QID  . folic acid  1 mg Oral Daily  . furosemide  20 mg Intravenous Once  . hydroxyurea  1,000 mg Oral Daily  . ibuprofen  600 mg Oral TID WC  . multivitamin with minerals  1 tablet Oral Daily  . oxyCODONE  80 mg Oral Q12H   And  . oxyCODONE  20 mg Oral Q12H  . polyethylene glycol  17 g Oral Daily  . sodium chloride  3 mL Intravenous Q12H  . DISCONTD: oxyCODONE  100 mg Oral Q12H   Continuous Infusions:    . sodium chloride 125 mL/hr at 09/21/11 0631     Assessment/Plan: 1. Sickle cell anemia with crisis-continue pain meds for painful crisis-continue fentanyl 25 mcg every 2 when necessary, ibuprofen 600 3 times a day with meals, OxyIR 30 mg every 4 when necessary. I've increased his OxyContin SR to 100 mg from 80 mg every 12 hourly today. Hopefully patient can get better and will be able to be tapered off of the IV pain medications in a couple of days. This would be indicated to by his decreased when necessary fentanyl use.  Patient will receive hydroxyurea 1000 mg, folate acid 1 mg daily, hydroxyzine 50 mg every 4 when necessary for itching+ Benadryl 25 mg IV/by mouth every 4 when necessary-PICC line not in optimal position-Dr. Myna Hidalgo is coordinating his exchange transfusion-usually this makes him feel better in a couple of days 2. 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 I did speak to Dr. Otelia Sergeant today about his management and based on my description of his findings, Dr. Weston Brass feels this is probably synovitis related to sickle cell disease and recommends conservative therapy with a sling and ultrasound per physical therapy 3. Icterus-Emend/Hydroxyzine/Gabapentin on board 4. Multiple areas of # on MR of hips +Synovitis-on Motrin in addition to multiple Opiates.  Added back high dose ibuprofen 4 arthritic-like pain per orthopedics 6/14-will reevaluate the basic metabolic panel tomorrow 5.  Hypercarbia with acute respiratory failure-resolved. Patient  initially admitted to [facility] on to 6/3 and transferred 6/4 just a worsening clinical status with hypercarbia respiratory acidosis and temperature 101.9.  Chest x-ray 6/10 shows no significant pneumonia currently.  Received appropriate Abx as per above however displaced PICC line-will discontinue Levaquin-patient afebrile and does not need further antibiotics 6. Constipation-currently on lactulose 20mg  daily. Passing stool. 7. Elevated LFTs-could be secondary to cholestasis versus medication effect of opiates.  Repeat and trend  Code Status: FULL Family Communication: None at bedside Disposition Plan:   Per Oncologist   Pleas Koch, MD  [facility] Pager [phone] 09/21/2011, 4:26 PM    LOS: 10 days

## 2011-09-21 NOTE — Progress Notes (Signed)
#   [id number] is note.

## 2011-09-22 DIAGNOSIS — D7289 Other specified disorders of white blood cells: Secondary | ICD-10-CM

## 2011-09-22 DIAGNOSIS — R509 Fever, unspecified: Secondary | ICD-10-CM

## 2011-09-22 DIAGNOSIS — D5700 Hb-SS disease with crisis, unspecified: Secondary | ICD-10-CM

## 2011-09-22 DIAGNOSIS — J9600 Acute respiratory failure, unspecified whether with hypoxia or hypercapnia: Secondary | ICD-10-CM

## 2011-09-22 LAB — CBC
Hemoglobin: 11.7 g/dL — ABNORMAL LOW (ref 13.0–17.0)
RBC: 3.81 MIL/uL — ABNORMAL LOW (ref 4.22–5.81)
WBC: 6.2 10*3/uL (ref 4.0–10.5)

## 2011-09-22 LAB — COMPREHENSIVE METABOLIC PANEL
CO2: 27 mEq/L (ref 19–32)
Calcium: 8.9 mg/dL (ref 8.4–10.5)
Creatinine, Ser: 0.92 mg/dL (ref 0.50–1.35)
GFR calc Af Amer: >90 mL/min (ref >90)
GFR calc non Af Amer: >90 mL/min (ref >90)
Glucose, Bld: 94 mg/dL (ref 70–99)

## 2011-09-22 NOTE — Progress Notes (Signed)
Orthopedic Tech Progress Note Patient Details:  [name] [birth year] [id number]  Patient ID: [name], male   DOB: January 31, 1981, 31 y.o.   MRN: [id number] Pt refused sling immobilizer  Nikki Dom 09/22/2011, 5:32 PM

## 2011-09-22 NOTE — Progress Notes (Signed)
PROGRESS NOTE  [name]:[id number] DOB: [birth year] DOA: 09/11/2011 PCP: Billee Cashing, MD  Brief narrative: 31 y/o AAM admitted 6/4 with Acute vaso-occlusive crisis and high-grade temperature.    Past medical history: Cholecystectomy 01/2004 Avascular necrosis R humeral head elevated troponin 07/13/10 admit Diastolic dysfunction per Cardiac cath 07/13/10 Significant pruritis S/p Exchange transfusions (?) 4 x in the past  Consultants:  Oncology/Hematology-Dr. Myna Hidalgo  Orthopedics-Phone consult with Dr. Otelia Sergeant for rec's  Procedures:  Central line placed 09/12/11  CXR 6/4=decreased ling vol+ Bibasilar opacities L>R  MRI L hip Bony infarct both femoral heads-small Hip joint effusions.  Diffuse Marrow signal anomaly noted.  Relatively unchanged imaging from prior  CXR 6/10=no acute findings  Exchange transfusion 6/13x1  Exchange transfusion 6/14x1  Antibiotics:  Cefepime 6/4-6/5   levofloxacin 6/4>> 6/13  Zosyn 6/4  Vancomycin 6/4   Subjective  States pain is 6-7/10--patient lying comfortably in bed. Usually has significant pain in left shoulder from avascular necrosis-never had Sx for this No nausea no vomiting no blurred vision no double vision no dark stool no tarry stool.   Objective    Interim History: Reviewed.  Appreciate Oncology input. Patient deferred ultrasound therapy to right shoulder  Subjective: See ONC notes  Objective: Filed Vitals:   09/22/11 0104 09/22/11 0635 09/22/11 1035 09/22/11 1407  BP: 121/66 116/64 106/57 115/65  Pulse: 75 76 73 65  Temp: 97.6 F (36.4 C) 98.2 F (36.8 C) 98.7 F (37.1 C) 98.6 F (37 C)  TempSrc: Oral Oral Oral Oral  Resp: 16 18 18 18   Height:      Weight:      SpO2:  95% 96% 96%    Intake/Output Summary (Last 24 hours) at 09/22/11 1704 Last data filed at 09/22/11 1300  Gross per 24 hour  Intake    287 ml  Output    800 ml  Net   -513 ml    Exam:  General: Alert pleasant African American  male in no apparent painful distress Cardiovascular: S1-S2 no murmur rub or gallop Respiratory: Clinically clear no added sound, some Rales heard occasionally however. No TVR no TVF Abdomen: Soft nontender nondistended Skin excoriations skin and to nose Neuro grossly intact and moving 4 limbs equally MSK-Able to do Neer's and Hawkins test.  Able to perfrom Empty can test and place hand behind his head.  Does have some pain in the R Ac joint area No redness, no warmth  Data Reviewed: Basic Metabolic Panel:  Lab 09/22/11 2952 09/20/11 0510 09/19/11 0600 09/18/11 0645 09/17/11 0400  NA 136 137 138 -- 138  K 3.9 4.0 -- -- --  CL 102 102 106 -- 102  CO2 27 28 26  -- 27  GLUCOSE 94 95 95 -- 98  BUN 12 10 12  -- 10  CREATININE 0.92 0.80 0.78 0.94 0.79  CALCIUM 8.9 9.1 8.7 -- 9.0  MG -- -- -- -- --  PHOS -- -- -- -- --   Liver Function Tests:  Lab 09/22/11 0500 09/20/11 0510  AST 65* 72*  ALT 58* 76*  ALKPHOS 179* 187*  BILITOT 0.7 1.0  PROT 6.8 6.9  ALBUMIN 2.9* 2.9*   No results found for this basename: LIPASE:5,AMYLASE:5 in the last 168 hours No results found for this basename: AMMONIA:5 in the last 168 hours CBC:  Lab 09/22/11 0500 09/20/11 0510 09/19/11 0600 09/18/11 0645 09/17/11 0400  WBC 6.2 6.8 6.9 9.9 8.7  NEUTROABS -- -- -- -- --  HGB 11.7* 11.1*  11.5* 11.9* 10.7*  HCT 34.1* 32.7* 33.3* 33.9* 30.6*  MCV 89.5 88.4 87.9 86.7 86.2  PLT 362 302 258 224 189   Cardiac Enzymes: No results found for this basename: CKTOTAL:5,CKMB:5,CKMBINDEX:5,TROPONINI:5 in the last 168 hours BNP: No components found with this basename: POCBNP:5 CBG: No results found for this basename: GLUCAP:5 in the last 168 hours  No results found for this or any previous visit (from the past 240 hour(s)).   Studies:              All Imaging reviewed and is as per above notation   Scheduled Meds:    . acetaminophen  650 mg Oral Once  . antiseptic oral rinse  15 mL Mouth Rinse BID  .  enoxaparin  100 mg Subcutaneous Q24H  . feeding supplement  237 mL Oral QID  . folic acid  1 mg Oral Daily  . hydroxyurea  1,000 mg Oral Daily  . ibuprofen  800 mg Oral TID  . multivitamin with minerals  1 tablet Oral Daily  . oxyCODONE  80 mg Oral Q12H   And  . oxyCODONE  20 mg Oral Q12H  . polyethylene glycol  17 g Oral Daily  . sodium chloride  3 mL Intravenous Q12H   Continuous Infusions:    . sodium chloride 125 mL/hr (09/22/11 1245)     Assessment/Plan: 1. Sickle cell anemia with crisis-continue pain meds for painful crisis-continue fentanyl 25 mcg every 2 when necessary, ibuprofen 600 3 times a day with meals, OxyIR 30 mg every 4 when necessary. I've increased his OxyContin SR to 100 mg from 80 mg every 12 hourly today. Hopefully patient can get better and will be able to be tapered off of the IV pain medications in a couple of days. This would be indicated to by his decreased when necessary fentanyl use.  Patient will receive hydroxyurea 1000 mg, folate acid 1 mg daily, hydroxyzine 50 mg every 4 when necessary for itching+ Benadryl 25 mg IV/by mouth every 4 when necessary-appreciate Dr. Vernie Shanks input is coordinating his exchange transfusion--will hopefully transition off of IV pain medication in 1-2 days 2. 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 I did speak to Dr. Otelia Sergeant today about his management and based on my description of his findings, Dr. Weston Brass feels this is probably synovitis related to sickle cell disease and recommends conservative therapy with a sling and ultrasound per physical therapy-patient deferred ultrasound 6/15. 3. Icterus-Emend/Hydroxyzine/Gabapentin on board 4. Multiple areas of # on MR of hips +Synovitis-on Motrin in addition to multiple Opiates.  Added back high dose ibuprofen 4 arthritic-like pain per orthopedics 6/14-will reevaluate the basic metabolic panel tomorrow 5.  Hypercarbia with acute respiratory  failure-resolved. Patient initially admitted to [facility] on to 6/3 and transferred 6/4 just a worsening clinical status with hypercarbia respiratory acidosis and temperature 101.9.  Chest x-ray 6/10 shows no significant pneumonia currently.  Received appropriate Abx as per above however displaced PICC line-will discontinue Levaquin-patient afebrile and does not need further antibiotics 6. Constipation-currently on lactulose 20mg  daily. Passing stool. 7. Elevated LFTs-could be secondary to cholestasis versus medication effect of opiates.    Code Status: FULL Family Communication: None at bedside Disposition Plan:   Per Oncologist   Pleas Koch, MD  [facility] Pager [phone] 09/22/2011, 5:04 PM    LOS: 11 days

## 2011-09-22 NOTE — Progress Notes (Signed)
Physical Therapy Note  Order received and chart reviewed.  Discussed ultrasound to R AC joint for pain control with pt and he prefers to not try ultrasound at this time.  Pt believes his pain is more related to his sickle cell crisis as he reports pain has gone to this location with previous crisis, and he would like to forgo ultrasound at this time.  Pt reports he will ask for re-order if he changes his mind.  PT will sign off.  Thank you.  Zenovia Jarred, PT Pager: [phone]

## 2011-09-22 NOTE — Progress Notes (Signed)
Subjective: Doing well, sickle pain improved. No nausea or vomiting. Continue hydration   Objective:  Most recent Vital signs: Blood pressure 106/57, pulse 73, temperature 98.7 F (37.1 C), temperature source Oral, resp. rate 18, height 5' 5.5" (1.664 m), weight 149 lb 14.6 oz (68 kg), SpO2 96.00%. 5' 5.5" (1.664 m)    Body surface area is 1.77 meters squared.  Vital signs in last 24 hours: Temp:  [97.6 F (36.4 C)-98.7 F (37.1 C)] 98.7 F (37.1 C) (06/15 1035) Pulse Rate:  [61-81] 73  (06/15 1035) Resp:  [16-18] 18  (06/15 1035) BP: (106-121)/(57-69) 106/57 mmHg (06/15 1035) SpO2:  [95 %-97 %] 96 % (06/15 1035)  Intake/Output from previous day: 06/14 0701 - 06/15 0700 In: 527 [P.O.:240; Blood:287] Out: 1100 [Urine:1100]  Physical Exam: General appearance: alert and cooperative Resp: clear to auscultation bilaterally and normal percussion bilaterally Cardio: regular rate and rhythm, S1, S2 normal, no murmur, click, rub or gallop GI: soft, non-tender; bowel sounds normal; no masses,  no organomegaly Extremities: extremities normal, atraumatic, no cyanosis or edema  Lab Results:   Basename 09/22/11 0500 09/20/11 0510  WBC 6.2 6.8  HGB 11.7* 11.1*  HCT 34.1* 32.7*  PLT 362 302   BMET:  Basename 09/22/11 0500 09/20/11 0510  NA 136 137  K 3.9 4.0  CL 102 102  CO2 27 28  GLUCOSE 94 95  BUN 12 10  CREATININE 0.92 0.80  CALCIUM 8.9 9.1   CMP:     Component Value Date/Time   NA 136 09/22/2011 0500   K 3.9 09/22/2011 0500   CL 102 09/22/2011 0500   CO2 27 09/22/2011 0500   GLUCOSE 94 09/22/2011 0500   BUN 12 09/22/2011 0500   CREATININE 0.92 09/22/2011 0500   CALCIUM 8.9 09/22/2011 0500   PROT 6.8 09/22/2011 0500   ALBUMIN 2.9* 09/22/2011 0500   AST 65* 09/22/2011 0500   ALT 58* 09/22/2011 0500   ALKPHOS 179* 09/22/2011 0500   BILITOT 0.7 09/22/2011 0500   GFRNONAA >90 09/22/2011 0500   GFRAA >90 09/22/2011 0500   Micro: Results for orders placed during the hospital  encounter of 09/11/11  CULTURE, BLOOD (ROUTINE X 2)     Status: Normal   Collection Time   09/11/11  9:25 AM      Component Value Range Status Comment   Specimen Description BLOOD RIGHT WRIST   Final    Special Requests BOTTLES DRAWN AEROBIC AND ANAEROBIC [facility]   Final    Culture  Setup Time [id number]   Final    Culture NO GROWTH 5 DAYS   Final    Report Status 09/18/2011 FINAL   Final   CULTURE, BLOOD (ROUTINE X 2)     Status: Normal   Collection Time   09/11/11  9:30 AM      Component Value Range Status Comment   Specimen Description BLOOD RIGHT HAND   Final    Special Requests BOTTLES DRAWN AEROBIC ONLY 4CC   Final    Culture  Setup Time [id number]   Final    Culture NO GROWTH 5 DAYS   Final    Report Status 09/18/2011 FINAL   Final   MRSA PCR SCREENING     Status: Abnormal   Collection Time   09/11/11  2:45 PM      Component Value Range Status Comment   MRSA by PCR POSITIVE (*) NEGATIVE Final    Studies/Results: No results found.  Medications: I have reviewed the  patient's current medications.  Assessment/Plan: 31 y.o. male with  1. Sickle cell Anemia: admitted with crisis pain s/p exchange transfusions with improvement in symptoms. He is continuing on pain meds and hydration.  2. Per Dr. Tama Gander notes patient may be ready to d/c early next week if he remains stable.     LOS: 11 days   Drue Second, MD Medical/Oncology [facility] [phone] (beeper) [phone] (Office)  09/22/2011, 10:58 AM

## 2011-09-23 DIAGNOSIS — R509 Fever, unspecified: Secondary | ICD-10-CM

## 2011-09-23 DIAGNOSIS — D5700 Hb-SS disease with crisis, unspecified: Secondary | ICD-10-CM

## 2011-09-23 DIAGNOSIS — J9600 Acute respiratory failure, unspecified whether with hypoxia or hypercapnia: Secondary | ICD-10-CM

## 2011-09-23 DIAGNOSIS — D7289 Other specified disorders of white blood cells: Secondary | ICD-10-CM

## 2011-09-23 LAB — TYPE AND SCREEN: Unit division: 0

## 2011-09-23 MED ORDER — OXYCODONE HCL 40 MG PO TB12
100.0000 mg | ORAL_TABLET | Freq: Two times a day (BID) | ORAL | Status: DC
  Administered 2011-09-23 – 2011-09-24 (×2): 100 mg via ORAL
  Filled 2011-09-23 (×2): qty 2

## 2011-09-23 MED ORDER — OXYCODONE HCL 40 MG PO TB12
80.0000 mg | ORAL_TABLET | Freq: Two times a day (BID) | ORAL | Status: DC
Start: 2011-09-23 — End: 2011-09-23

## 2011-09-23 MED ORDER — FENTANYL CITRATE 0.05 MG/ML IJ SOLN
25.0000 ug | INTRAMUSCULAR | Status: DC | PRN
  Administered 2011-09-24: 25 ug via INTRAVENOUS
  Filled 2011-09-23 (×2): qty 2

## 2011-09-23 MED ORDER — OXYCODONE HCL 5 MG PO TABS: 30.0000 mg | ORAL_TABLET | Freq: Four times a day (QID) | ORAL | Status: DC | PRN

## 2011-09-23 NOTE — Progress Notes (Signed)
Subjective: Doing well, sickle pain resolved. No nausea or vomiting.   Objective:  Most recent Vital signs: Blood pressure 118/72, pulse 71, temperature 98.5 F (36.9 C), temperature source Oral, resp. rate 16, height 5' 5.5" (1.664 m), weight 149 lb 14.6 oz (68 kg), SpO2 95.00%. 5' 5.5" (1.664 m)    Body surface area is 1.77 meters squared.  Vital signs in last 24 hours: Temp:  [98.3 F (36.8 C)-99.4 F (37.4 C)] 98.5 F (36.9 C) (06/16 0530) Pulse Rate:  [63-73] 71  (06/16 0530) Resp:  [16-18] 16  (06/16 0530) BP: (106-126)/(57-72) 118/72 mmHg (06/16 0530) SpO2:  [95 %-97 %] 95 % (06/16 0530)  Intake/Output from previous day: 06/15 0701 - 06/16 0700 In: 3115 [P.O.:240; I.V.:2875] Out: 2050 [Urine:2050]  Physical Exam: General appearance: alert and cooperative Resp: clear to auscultation bilaterally and normal percussion bilaterally Cardio: regular rate and rhythm, S1, S2 normal, no murmur, click, rub or gallop GI: soft, non-tender; bowel sounds normal; no masses,  no organomegaly Extremities: extremities normal, atraumatic, no cyanosis or edema  Lab Results:   Basename 09/22/11 0500  WBC 6.2  HGB 11.7*  HCT 34.1*  PLT 362   BMET:  Basename 09/22/11 0500  NA 136  K 3.9  CL 102  CO2 27  GLUCOSE 94  BUN 12  CREATININE 0.92  CALCIUM 8.9   CMP:     Component Value Date/Time   NA 136 09/22/2011 0500   K 3.9 09/22/2011 0500   CL 102 09/22/2011 0500   CO2 27 09/22/2011 0500   GLUCOSE 94 09/22/2011 0500   BUN 12 09/22/2011 0500   CREATININE 0.92 09/22/2011 0500   CALCIUM 8.9 09/22/2011 0500   PROT 6.8 09/22/2011 0500   ALBUMIN 2.9* 09/22/2011 0500   AST 65* 09/22/2011 0500   ALT 58* 09/22/2011 0500   ALKPHOS 179* 09/22/2011 0500   BILITOT 0.7 09/22/2011 0500   GFRNONAA >90 09/22/2011 0500   GFRAA >90 09/22/2011 0500   Micro: Results for orders placed during the hospital encounter of 09/11/11  CULTURE, BLOOD (ROUTINE X 2)     Status: Normal   Collection Time   09/11/11  9:25 AM      Component Value Range Status Comment   Specimen Description BLOOD RIGHT WRIST   Final    Special Requests BOTTLES DRAWN AEROBIC AND ANAEROBIC [facility]   Final    Culture  Setup Time [id number]   Final    Culture NO GROWTH 5 DAYS   Final    Report Status 09/18/2011 FINAL   Final   CULTURE, BLOOD (ROUTINE X 2)     Status: Normal   Collection Time   09/11/11  9:30 AM      Component Value Range Status Comment   Specimen Description BLOOD RIGHT HAND   Final    Special Requests BOTTLES DRAWN AEROBIC ONLY 4CC   Final    Culture  Setup Time [id number]   Final    Culture NO GROWTH 5 DAYS   Final    Report Status 09/18/2011 FINAL   Final   MRSA PCR SCREENING     Status: Abnormal   Collection Time   09/11/11  2:45 PM      Component Value Range Status Comment   MRSA by PCR POSITIVE (*) NEGATIVE Final    Studies/Results: No results found.  Medications: I have reviewed the patient's current medications.  Assessment/Plan: 31 y.o. male with  1. Sickle cell Anemia: admitted with crisis pain s/p  exchange transfusions with improvement in symptoms. He is doing very well this am.   2. Disposition: OK to go home if he is taking po pain medication only.      LOS: 12 days   Drue Second, MD Medical/Oncology [facility] [phone] (beeper) [phone] (Office)  09/23/2011, 8:47 AM

## 2011-09-23 NOTE — Progress Notes (Deleted)
Discussion with pt about pain medication. Reported that he was upset he was only the 2mg for pain verses 4mg each time. RN discussed 2mg given throughout the night the prior night shift, for pain without pt complaint. Pt reported "no one told me I was not getting the 4mg dose, they should ask me first". RN discussed nursing judgement and sedation issues related to IV pain medication. Encouraged [name] to talk with RN, since he is aware that 2-4mg is ordered and express need at each prn request. Admitted understanding.   

## 2011-09-23 NOTE — Progress Notes (Signed)
PROGRESS NOTE  [name] VHQ:[id number] DOB: 1981/04/06 DOA: 09/11/2011 PCP: Billee Cashing, MD  Brief narrative: 30 y/o AAM admitted 6/4 with Acute vaso-occlusive crisis and high-grade temperature.    Past medical history: Cholecystectomy 01/2004 Avascular necrosis R humeral head elevated troponin 07/13/10 admit Diastolic dysfunction per Cardiac cath 07/13/10 Significant pruritis S/p Exchange transfusions (?) 4 x in the past  Consultants:  Oncology/Hematology-Dr. Myna Hidalgo  Orthopedics-Phone consult with Dr. Otelia Sergeant for rec's  Procedures:  Central line placed 09/12/11  CXR 6/4=decreased ling vol+ Bibasilar opacities L>R  MRI L hip Bony infarct both femoral heads-small Hip joint effusions.  Diffuse Marrow signal anomaly noted.  Relatively unchanged imaging from prior  CXR 6/10=no acute findings  Exchange transfusion 6/13x1  Exchange transfusion 6/14x1  Antibiotics:  Cefepime 6/4-6/5   levofloxacin 6/4>> 6/13  Zosyn 6/4  Vancomycin 6/4   Subjective  States pain is 6-7/10--patient lying comfortably in bed. No nausea no vomiting no blurred vision no double vision no dark stool no tarry stool.   Objective    Interim History: Reviewed.  Appreciate Oncology input. Patient deferred ultrasound therapy to right shoulder  Subjective: See ONC notes  Objective: Filed Vitals:   09/23/11 0100 09/23/11 0530 09/23/11 1022 09/23/11 1334  BP: 116/69 118/72 117/71 131/71  Pulse: 63 71 62 64  Temp: 98.3 F (36.8 C) 98.5 F (36.9 C) 98.6 F (37 C) 98.2 F (36.8 C)  TempSrc: Oral Oral Oral Oral  Resp: 16 16 18 18   Height:      Weight:      SpO2: 95% 95% 96% 96%    Intake/Output Summary (Last 24 hours) at 09/23/11 1617 Last data filed at 09/23/11 1300  Gross per 24 hour  Intake   2355 ml  Output   1950 ml  Net    405 ml    Exam:  General: Alert pleasant African American male in no apparent painful distress Cardiovascular: S1-S2 no murmur rub or  gallop Respiratory: Clinically clear no added sound, some Rales heard occasionally however. No TVR no TVF Abdomen: Soft nontender nondistended Skin excoriations skin and to nose Neuro grossly intact and moving 4 limbs equally MSK-Able to do Neer's and Hawkins test.  Able to perfrom Empty can test and place hand behind his head.  Does have some pain in the R Ac joint area No redness, no warmth  Data Reviewed: Basic Metabolic Panel:  Lab 09/22/11 4132 09/20/11 0510 09/19/11 0600 09/18/11 0645 09/17/11 0400  NA 136 137 138 -- 138  K 3.9 4.0 -- -- --  CL 102 102 106 -- 102  CO2 27 28 26  -- 27  GLUCOSE 94 95 95 -- 98  BUN 12 10 12  -- 10  CREATININE 0.92 0.80 0.78 0.94 0.79  CALCIUM 8.9 9.1 8.7 -- 9.0  MG -- -- -- -- --  PHOS -- -- -- -- --   Liver Function Tests:  Lab 09/22/11 0500 09/20/11 0510  AST 65* 72*  ALT 58* 76*  ALKPHOS 179* 187*  BILITOT 0.7 1.0  PROT 6.8 6.9  ALBUMIN 2.9* 2.9*   No results found for this basename: LIPASE:5,AMYLASE:5 in the last 168 hours No results found for this basename: AMMONIA:5 in the last 168 hours CBC:  Lab 09/22/11 0500 09/20/11 0510 09/19/11 0600 09/18/11 0645 09/17/11 0400  WBC 6.2 6.8 6.9 9.9 8.7  NEUTROABS -- -- -- -- --  HGB 11.7* 11.1* 11.5* 11.9* 10.7*  HCT 34.1* 32.7* 33.3* 33.9* 30.6*  MCV 89.5 88.4 87.9  86.7 86.2  PLT 362 302 258 224 189   Cardiac Enzymes: No results found for this basename: CKTOTAL:5,CKMB:5,CKMBINDEX:5,TROPONINI:5 in the last 168 hours BNP: No components found with this basename: POCBNP:5 CBG: No results found for this basename: GLUCAP:5 in the last 168 hours  No results found for this or any previous visit (from the past 240 hour(s)).   Studies:              All Imaging reviewed and is as per above notation   Scheduled Meds:    . acetaminophen  650 mg Oral Once  . antiseptic oral rinse  15 mL Mouth Rinse BID  . enoxaparin  100 mg Subcutaneous Q24H  . feeding supplement  237 mL Oral QID  .  folic acid  1 mg Oral Daily  . hydroxyurea  1,000 mg Oral Daily  . ibuprofen  800 mg Oral TID  . multivitamin with minerals  1 tablet Oral Daily  . oxyCODONE  100 mg Oral BID  . polyethylene glycol  17 g Oral Daily  . sodium chloride  3 mL Intravenous Q12H  . DISCONTD: oxyCODONE  20 mg Oral Q12H  . DISCONTD: oxyCODONE  80 mg Oral Q12H  . DISCONTD: oxyCODONE  80 mg Oral BID  . DISCONTD: oxyCODONE  80 mg Oral BID   Continuous Infusions:    . sodium chloride 125 mL/hr at 09/22/11 1400     Assessment/Plan: 1. Sickle cell anemia with crisis-continue pain meds for painful crisis-continue fentanyl 25 mcg every 2 when necessary, ibuprofen 600 3 times a day with meals, OxyIR 30 mg every 4 when necessary. I've increased his OxyContin SR to 100 mg from 80 mg every 12 hourly today. Hopefully patient can get better and will be able to be tapered off of the IV pain medications in a couple of days.  Patient will receive hydroxyurea 1000 mg, folate acid 1 mg daily, hydroxyzine 50 mg every 4 when necessary for itching+ Benadryl 25 mg IV/by mouth every 4 when necessary-appreciate Dr. Vernie Shanks input is coordinating his exchange transfusion--weaned his fentanyl from q. 2 hourly 25 mg every 4 hourly when necessary  2. 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 I did speak to Dr. Otelia Sergeant today about his management and based on my description of his findings, Dr. Weston Brass feels this is probably synovitis related to sickle cell disease and recommends conservative therapy with a sling and ultrasound per physical therapy-patient deferred ultrasound 6/15. 3. Icterus-Emend/Hydroxyzine/Gabapentin on board 4. Multiple areas of # on MR of hips +Synovitis-on Motrin in addition to multiple Opiates.  Added back high dose ibuprofen 4 arthritic-like pain per orthopedics 6/14-will reevaluate the basic metabolic panel tomorrow 5.  Hypercarbia with acute respiratory failure-resolved. Patient  initially admitted to [facility] on to 6/3 and transferred 6/4 just a worsening clinical status with hypercarbia respiratory acidosis and temperature 101.9.  Chest x-ray 6/10 shows no significant pneumonia currently.  Received appropriate Abx as per above however displaced PICC line-will discontinue Levaquin-patient afebrile and does not need further antibiotics 6. Constipation-currently on lactulose 20mg  daily. Passing stool. 7. Elevated LFTs-could be secondary to cholestasis versus medication effect of opiates.    Code Status: FULL Family Communication: None at bedside Disposition Plan:   Per Oncologist   Pleas Koch, MD  [facility] Pager [phone] 09/23/2011, 4:17 PM    LOS: 12 days

## 2011-09-24 DIAGNOSIS — J9600 Acute respiratory failure, unspecified whether with hypoxia or hypercapnia: Secondary | ICD-10-CM

## 2011-09-24 DIAGNOSIS — R509 Fever, unspecified: Secondary | ICD-10-CM

## 2011-09-24 DIAGNOSIS — D5700 Hb-SS disease with crisis, unspecified: Secondary | ICD-10-CM

## 2011-09-24 DIAGNOSIS — D7289 Other specified disorders of white blood cells: Secondary | ICD-10-CM

## 2011-09-24 NOTE — Progress Notes (Signed)
Pt D/C home . Pt is alert and oriented, no new complains. Vitals stable for pt's normal range. Medication administration and discharge instruction education done. Pt verbalizes understanding .

## 2011-09-24 NOTE — Progress Notes (Signed)
Mr. Maselli is ready to go home today. He feels good. He says he can manage his pain at home at this point in time. He is in a good frame of mind to go home. I don't see that any further intervention from our point of view is necessary.  His hemoglobin is 11.7. I believe we've diluted out his sickle cells and off so that he cannot get a crisis.  He has the Hydrea and folic acid at home. He has pain medications at home. He does not need any when he is discharged.  His vital signs are stable. He is afebrile. Blood pressure is 123/71. His lungs are clear bilaterally. No wheezes are noted. Cardiac exam regular rate and rhythm with a normal S1-S2. There are no murmurs rubs or bruits. Abdominal sounds soft with good bowel sounds. There is no palpable abdominal mass. There is no fluid wave. There is no palpable hepatospleno megaly. Extremities shows good range of motion of his joints. Has better range of motion of his right shoulder.   His labs show [place] a count 6.2 hemoglobin 11.7 hematocrit 34.1 platelet count 362.  Again, I would plan for Mr. Mabile to go home. He is ready to go home. His crises typically given hospital for about 2 weeks.  [name]

## 2011-09-24 NOTE — Discharge Summary (Addendum)
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 Myna Hidalgo is PCP-needs to establish care with a PCP    [name] ZOX:[id number] DOB: [birth year] DOA: 09/11/2011 PCP: Billee Cashing, MD  Brief narrative: 31 y/o AAM admitted 6/4 with Acute vaso-occlusive crisis and high-grade temperature.   Found to be in sickle crises and covered initally with Abx which were subsequently weaned off.  Dr. Myna Hidalgo involved in his sickle care-Exchange transfused x 2 and ultimately d/c home--see below for full details  Past medical history: Cholecystectomy 01/2004 Avascular necrosis R humeral head elevated troponin 07/13/10 admit Diastolic dysfunction per Cardiac cath 07/13/10 Significant pruritis S/p Exchange transfusions (?) 4 x in the past  Consultants:  Oncology/Hematology-Dr. Myna Hidalgo   Orthopedics-Phone consult with Dr. Otelia Sergeant for rec's  Procedures:  Central line placed 09/12/11   CXR 6/4=decreased ling vol+ Bibasilar opacities L>R   MRI L hip Bony infarct both femoral heads-small Hip joint effusions.  Diffuse Marrow signal anomaly noted.  Relatively unchanged imaging from prior   CXR 6/10=no acute findings   Exchange transfusion 6/13x1   Exchange transfusion 6/14x1  Antibiotics:  Cefepime 6/4-6/5   levofloxacin 6/4>> 6/13   Zosyn 6/4   Vancomycin 6/4      Hospital Course by problem list: : 1. Sickle cell anemia with crisis-continue pain meds for painful crisis-continue fentanyl 25 mcg every 2 when necessary, ibuprofen 600 3 times a day with meals, OxyIR 30 mg every 4 when necessary. I've increased his OxyContin SR to 100 mg from 80 mg every 12 hourly today. Hopefully patient can get better and will be  able to be tapered off of the IV pain medications in a couple of days.  Patient will receive hydroxyurea 1000 mg, folate acid 1 mg daily, hydroxyzine 50 mg every 4 when necessary for itching+ Benadryl 25 mg IV/by mouth every 4 when necessary-appreciate Dr. Vernie Shanks input is coordinating his exchange transfusion--weaned his fentanyl from q. 2 hourly 25 mg every 4 hourly when necessary.  Patient reviewed by Dr. Myna Hidalgo 6/17 and as in no pain and theoretically out of extremis for Sickle crises, patient encouraged to be d/c home on usual regimen with 1 week follow up with Dr. Parthenia Ames enough opiates for home   2. 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 I did speak to Dr. Otelia Sergeant today about his management and based on my description of his findings, Dr. Weston Brass feels this is probably synovitis related to sickle cell disease and recommends conservative therapy with a sling and ultrasound per physical therapy-patient deferred ultrasound 6/15., as well as sling 6/16 3. Icterus-Emend/Hydroxyzine/Gabapentin on board-to continue the same as an out-patient 4. Multiple areas of # on MR of hips +Synovitis-on Motrin in addition to multiple Opiates.  Added back high dose ibuprofen 4 arthritic-like pain per orthopedics 6/14-will reevaluate the basic metabolic panel tomorrow  5. Hypercarbia with acute respiratory failure-resolved. Patient initially admitted to [facility] on to 6/3 and transferred 6/4 just a worsening clinical status with hypercarbia respiratory acidosis and temperature 101.9.  Chest x-ray 6/10 shows no significant pneumonia currently.  Received appropriate Abx as per above however displaced PICC line-will discontinue Levaquin-patient afebrile and does not need further antibiotics  6. Constipation-currently on lactulose 20mg  daily. Passing stool.  7. Elevated LFTs-could be secondary to cholestasis versus medication effect of opiates.    LOS: 13 days     Procedures Performed  and pertinent labs: Dg Chest 2 View  09/19/2011  *RADIOLOGY REPORT*  Clinical Data: Cough, chest pain, sickle cell, fever  CHEST - 2 VIEW  Comparison: 09/17/2011  Findings: Heart size and vascular pattern are within normal limits. There is a left PICC line which has changed in position. Previously via left-sided approach it was seen with tip at the cavoatrial junction.  Currently, the PICC line now ascends within the superior vena cava away from the heart and extends toward the right neck.  The lungs are free of infiltrate or consolidation.  No pleural effusions.  IMPRESSION: Interval change in position of left PICC line.  PICC line should be repositioned.  Original Report Authenticated By: [id number]   Dg Chest 2 View  09/11/2011  *RADIOLOGY REPORT*  Clinical Data: Sickle cell, diffuse chest pain  CHEST - 2 VIEW  Comparison: 09/10/2011; 04/19/2011; 04/17/2011  Findings: Grossly unchanged borderline enlarged cardiac silhouette given slightly decreased lung volumes. Minimal bibasilar heterogeneous opacities, favored to represent atelectasis.  There is mild elevation of the right hemidiaphragm.  No pleural effusion or pneumothorax.  Post cholecystectomy.  Unchanged stigmata of sickle cell disease affecting multiple thoracolumbar vertebral bodies.  Increased sclerosis of the bilateral humeral head, grossly unchanged.  Post cholecystectomy.  IMPRESSION: Decreased lung volumes with bibasilar opacities, left greater than right, possibly atelectasis.  Original Report Authenticated By: Waynard Reeds, M.D.   Dg Chest 2 View  09/10/2011  *RADIOLOGY REPORT*  Clinical Data: Sickle cell crisis.  CHEST - 2 VIEW  Comparison: 04/19/2011  Findings: AP and lateral view the chest shows a stable enlargement the cardiopericardial silhouette with vascular congestion.  There is some interstitial coarsening without overt edema.  No pleural effusion. Telemetry leads overlie the chest.  IMPRESSION: No focal airspace consolidation.   Original Report Authenticated By: ERIC A. MANSELL, M.D.   Dg Hip Complete Left  09/16/2011  *RADIOLOGY REPORT*  Clinical Data: Left hip pain.  Sickle cell anemia.  LEFT HIP - COMPLETE 2+ VIEW  Comparison: Radiographs 02/28/2010.  Findings: Diffuse slightly heterogeneous osteosclerosis is again noted consistent with sickle cell anemia.  A subchondral lucency laterally in the left femoral head is stable.  There is no subchondral collapse or significant joint space loss.  Right pelvic phleboliths appear unchanged. Oval densities overlapping the lower sacrum overlap the rectal air column and may be within the rectum. Bladder calculi are difficult to exclude.  IMPRESSION: Stable appearance of the left femoral head with stable subchondral lucency and generalized osteosclerosis. No acute osseous findings identified.  New oval densities in the pelvis may be within the rectum, although bladder calculi are difficult to exclude.  Original Report Authenticated By: Gerrianne Scale, M.D.   Mr Hip Left Wo Contrast  09/17/2011  *RADIOLOGY REPORT*  Clinical Data:  Sickle disease patient.  Chronic left hip pain. Question avascular necrosis.  MRI OF THE LEFT HIP WITHOUT CONTRAST  Technique:  Multiplanar, multisequence MR imaging was performed. No intravenous contrast was administered.  Comparison: Plain films left hip 09/16/2011 and 02/28/2010. CT abdomen and pelvis 01/15/2005.  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 Infarction is present in both femoral heads, larger on the left, with associated marrow edema. Left greater than right hip joint effusions are also seen consistent with synovitis related to bone  infarcts.  The patient's left femoral head infarct is along the superior margin of the femoral head with some flattening of the periphery.  Right femoral head infarct involves the medial aspect of the articular surface.  Additional  infarcts are seen involving the greater trochanters bilaterally, larger on the right.  Sacral infarction and lower lumbar spine infarcts with associated vertebral body height loss are also identified.  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  Imaged intrapelvic contents are unremarkable.  IMPRESSION:  1.  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  Small hip joint effusions are compatible with synovitis, larger on the left. 2.  Diffuse marrow signal abnormality consistent with the presence of multifocal bone infarcts and the presence of active marrow. 3.  Shortened appearance of the femoral necks and bilaterally with coxa magna deformities, unchanged.  Original Report Authenticated By: Bernadene Bell. Maricela Curet, M.D.   Dg Chest Port 1 View  09/19/2011  *RADIOLOGY REPORT*  Clinical Data: Check PICC position  PORTABLE CHEST - 1 VIEW  Comparison: 09/19/2011 at 0859 hours  Findings: Stable malpositioned left arm PICC with its tip coursing into the right brachiocephalic vein and pointing towards the right neck.  Lungs are essentially clear. No pleural effusion or pneumothorax.  The heart is top normal in size.  IMPRESSION: Stable malpositioned left arm PICC with its tip pointing towards the right neck.  Original Report Authenticated By: Charline Bills, M.D.   Dg Chest Port 1 View  09/17/2011  *RADIOLOGY REPORT*  Clinical Data: Line placement.  PORTABLE CHEST - 1 VIEW  Comparison: 09/12/2011.  Findings: Trachea is midline.  Heart is mildly enlarged.  Left PICC tip projects over the SVC.  Left IJ central line has been removed. Lungs are low in volume.  No pleural fluid.  IMPRESSION: Low lung volumes.  No acute findings.  Original Report Authenticated By: Reyes Ivan, M.D.   Dg Chest Port 1 View  09/12/2011  *RADIOLOGY REPORT*  Clinical Data: Evaluate central line placement.  PORTABLE CHEST - 1 VIEW  Comparison: Chest x-ray 09/12/2011.   Findings: There has been interval placement of a left internal jugular central venous catheter.  This catheter crosses the midline and extends upward, likely within the right subclavian vein.  No pneumothorax.  Lung volumes are low.  No consolidative airspace disease.  No pleural effusions.  Mild pulmonary venous congestion without frank pulmonary edema.  Heart size is mildly enlarged. The patient is rotated to the left on today's exam, resulting in distortion of the mediastinal contours and reduced diagnostic sensitivity and specificity for mediastinal pathology.  Surgical clips project over the right upper quadrant of the abdomen, likely from prior cholecystectomy.  IMPRESSION: 1.  Tip of the new left internal jugular central venous catheter is misdirected into the right subclavian vein. 2.  Low lung volumes with pulmonary venous congestion. 3.  Mild cardiomegaly.  Original Report Authenticated By: Florencia Reasons, M.D.   Dg Chest Port 1 View  09/12/2011  *RADIOLOGY REPORT*  Clinical Data: Sickle cell crisis.  Chest pain.  PORTABLE CHEST - 1 VIEW  Comparison: Two-view chest x-rays yesterday, 09/10/2011 and portable chest x-ray 04/17/2011, 11/13/2010.  Findings: Suboptimal inspiration.  Cardiac silhouette moderately enlarged but stable.  Interval development of pulmonary venous hypertension and perhaps mild diffuse interstitial pulmonary edema. No confluent airspace consolidation.  IMPRESSION: Cardiomegaly.  Mild diffuse interstitial pulmonary edema suspected, consistent with mild CHF and/or fluid overload.  Original Report Authenticated  By: Arnell Sieving, M.D.    Discharge Vitals & PE:  BP 128/71  Pulse 66  Temp 98.1 F (36.7 C) (Oral)  Resp 14  Ht 5' 5.5" (1.664 m)  Wt 71.804 kg (158 lb 4.8 oz)  BMI 25.94 kg/m2  SpO2 98% General: Alert pleasant African American male in no apparent painful distress Cardiovascular: S1-S2 no murmur rub or gallop Respiratory: Clinically clear no added sound,  some Rales heard occasionally however. No TVR no TVF Abdomen: Soft nontender nondistended Skin excoriations skin and to nose Neuro grossly intact and moving 4 limbs equally MSK-Able to do Neer's and Hawkins test.  Able to perfrom Empty can test and place hand behind his head.  Does have some pain in the R Ac joint area No redness, no warmth   Discharge Labs: No results found for this or any previous visit (from the past 24 hour(s)).  Disposition and follow-up:   Mr.Elohim N Biehn was discharged from in good condition.    Follow-up Appointments:  Follow-up Information    Follow up with Josph Macho, MD in 1 week.   Contact information:   [address], [address] [place] [address] [phone]          Discharge Medications: Medication List  As of 09/24/2011 10:58 AM   TAKE these medications         bismuth subsalicylate 262 MG/15ML suspension   Commonly known as: PEPTO BISMOL   Take 15 mLs by mouth every 6 (six) hours as needed. Stomach upset      folic acid 1 MG tablet   Commonly known as: FOLVITE   Take 1 mg by mouth daily.      hydroxyurea 500 MG capsule   Commonly known as: HYDREA   Take 1,000 mg by mouth daily. May take with food to minimize GI side effects.      multivitamin with minerals Tabs   Take 1 tablet by mouth daily.      oxyCODONE 80 MG 12 hr tablet   Commonly known as: OXYCONTIN   Take 1 tablet (80 mg total) by mouth every 12 (twelve) hours.      oxycodone 30 MG immediate release tablet   Commonly known as: ROXICODONE   1 - 2 tabs every 6 hours prn sickle cell crisis pain      VITAMIN B 12 PO   Take 1 tablet by mouth.           Medications Discontinued During This Encounter  Medication Reason  . piperacillin-tazobactam (ZOSYN) IVPB 3.375 g   . HYDROmorphone (DILAUDID) injection 2 mg   . HYDROmorphone (DILAUDID) injection 1 mg   . HYDROmorphone (DILAUDID) injection 1-2 mg   . diphenhydrAMINE (BENADRYL) capsule 25 mg    . ceFEPIme (MAXIPIME) 1 g in dextrose 5 % 50 mL IVPB   . sodium chloride 0.9 % injection Returned to ADS  . fentaNYL (SUBLIMAZE) injection 25-100 mcg   . ceFEPIme (MAXIPIME) 1 g in dextrose 5 % 50 mL IVPB   . levofloxacin (LEVAQUIN) IVPB 750 mg   . famotidine (PEPCID) IVPB 20 mg   . nalbuphine (NUBAIN) injection 10 mg   . oxyCODONE (Oxy IR/ROXICODONE) immediate release tablet 30 mg   . oxyCODONE (Oxy IR/ROXICODONE) immediate release tablet 60 mg Entry Error  . oxyCODONE (Oxy IR/ROXICODONE) immediate release tablet 30-60 mg   . levofloxacin (LEVAQUIN) tablet 500 mg   . lactulose (CHRONULAC) 10 GM/15ML solution 20 g   . fentaNYL (  SUBLIMAZE) injection 25 mcg   . fentaNYL (SUBLIMAZE) injection 50 mcg   . oxyCODONE (Oxy IR/ROXICODONE) immediate release tablet 30-60 mg   . oxyCODONE (OXYCONTIN) 12 hr tablet 80 mg Duplicate  . enoxaparin (LOVENOX) injection 40 mg   . diphenhydrAMINE (BENADRYL) injection 25 mg   . diphenhydrAMINE (BENADRYL) injection 25 mg   . oxyCODONE (OXYCONTIN) 12 hr tablet 80 mg   . famotidine (PEPCID) tablet 20 mg   . oxyCODONE (OXYCONTIN) 12 hr tablet 100 mg   . ibuprofen (ADVIL,MOTRIN) tablet 600 mg   . fentaNYL (SUBLIMAZE) injection 25 mcg   . oxyCODONE (Oxy IR/ROXICODONE) immediate release tablet 30 mg   . oxyCODONE (OXYCONTIN) 12 hr tablet 80 mg   . oxyCODONE (OXYCONTIN) 12 hr tablet 20 mg   . oxyCODONE (OXYCONTIN) 12 hr tablet 80 mg   . oxyCODONE (OXYCONTIN) 12 hr tablet 80 mg   . enoxaparin (LOVENOX) injection 100 mg     > 40 minutes time spent preparing d/c summary, including direct face-face patient Time, contact with consultants, family and care coordination   SignedRhetta Mura 09/24/2011, 10:58 AM

## 2011-09-24 NOTE — Progress Notes (Signed)
Per MD order, PICC line removed. Cath intact at 43cm. Vaseline pressure gauze to site, pressure held x 5min. No bleeding to site. Pt instructed to keep dressing CDI x 24 hours. Avoid heavy lifting, pushing or pulling x 24 hours,  If bleeding occurs hold pressure, if bleeding does not stop contact MD or go to the ED. Pt does not have any questions. [name] M  

## 2011-10-01 ENCOUNTER — Other Ambulatory Visit (HOSPITAL_BASED_OUTPATIENT_CLINIC_OR_DEPARTMENT_OTHER): Payer: Medicare Other | Admitting: Lab

## 2011-10-01 ENCOUNTER — Ambulatory Visit (HOSPITAL_BASED_OUTPATIENT_CLINIC_OR_DEPARTMENT_OTHER): Payer: Medicare Other | Admitting: Hematology & Oncology

## 2011-10-01 VITALS — BP 112/70 | HR 67 | Temp 98.3°F | Ht 65.0 in | Wt 146.0 lb

## 2011-10-01 DIAGNOSIS — D5720 Sickle-cell/Hb-C disease without crisis: Secondary | ICD-10-CM

## 2011-10-01 DIAGNOSIS — D5700 Hb-SS disease with crisis, unspecified: Secondary | ICD-10-CM

## 2011-10-01 DIAGNOSIS — D571 Sickle-cell disease without crisis: Secondary | ICD-10-CM

## 2011-10-01 DIAGNOSIS — M659 Synovitis and tenosynovitis, unspecified: Secondary | ICD-10-CM

## 2011-10-01 LAB — CBC WITH DIFFERENTIAL (CANCER CENTER ONLY)
Eosinophils Absolute: 0.1 10*3/uL (ref 0.0–0.5)
HCT: 39.7 % (ref 38.7–49.9)
LYMPH%: 35.8 % (ref 14.0–48.0)
MCV: 91 fL (ref 82–98)
MONO#: 0.8 10*3/uL (ref 0.1–0.9)
NEUT%: 47.7 % (ref 40.0–80.0)
Platelets: 578 10*3/uL — ABNORMAL HIGH (ref 145–400)
RBC: 4.37 10*6/uL (ref 4.20–5.70)
WBC: 5.9 10*3/uL (ref 4.0–10.0)

## 2011-10-01 MED ORDER — OXYCODONE HCL 30 MG PO TABS: ORAL_TABLET | ORAL | Status: DC

## 2011-10-01 MED ORDER — OXYCODONE HCL 80 MG PO TB12: 80.0000 mg | ORAL_TABLET | Freq: Two times a day (BID) | ORAL | Status: DC

## 2011-10-01 NOTE — Progress Notes (Signed)
This office note has been dictated.

## 2011-10-02 NOTE — Progress Notes (Signed)
DIAGNOSIS:  Hemoglobin SS disease.  CURRENT THERAPY: 1. Hydrea 500 mg p.o. b.i.d. 2. Folic acid 1 mg p.o. daily.  INTERIM HISTORY:  Mr. Dorris comes in for followup.  He was discharged from [facility] after sickle crisis back on 09/24/2011.  He was hospitalized for about 2 weeks.  This is typical for him.  He did get exchange transfused while in the hospital.  The real problem while he was in the hospital was that he has such a severe allergy to any other medication for pain besides oxycodone and he really scratches himself silly.  He is doing better right now.  He is not complaining too much in the way of pain.  He did have problems with pain in his left hip area.  We did get an MRI of the hip.  It did show that he had a bone infarcts in both femoral heads.  He has a small joint effusion compatible with synovitis.  He also was complaining of pain in the right shoulder.  This we really did not have to evaluate.  When he left the hospital, his hemoglobin was 11.2, hematocrit 34.1.  Again, he is feeling better.  He is eating well.  He is having no problems with headache.  There is no cough or shortness breath.  He has had no nausea or vomiting.  PHYSICAL EXAMINATION:  This is a well-developed, well-nourished black gentleman in no obvious distress.  Vital signs:  Temperature of 98.3, pulse 67, respiratory rate 18, blood pressure 112/70.  Weight is 146. Head and neck:  Normocephalic, atraumatic skull.  There are no ocular or oral lesions.  There are no palpable cervical or supraclavicular lymph nodes.  Lungs:  Clear bilaterally.  Cardiac:  Regular rate and rhythm with a normal S1, S2.  There are no murmurs, rubs or bruits.  Abdomen: Soft with good bowel sounds.  There is no fluid wave.  There is no palpable hepatosplenomegaly.  Extremities:  No clubbing, cyanosis or edema.  Neurological:  No focal neurological deficits.  Skin:  Some areas of excoriation from his  scratching.  LABORATORY STUDIES:  White cell count is 5.9, hemoglobin 13.7, hematocrit 39.7, platelet count 578.  IMPRESSION:  Mr. Bougie is a 31 year old African American gentleman with hemoglobin SS disease.  Again, he runs into sickle crises several times a year.  He actually has done fairly well.  We will go ahead and plan to get him back in another 6 weeks.  We will possibly consider a phlebotomy with IV fluids.  He can tend to get hyperviscous if his hemoglobin gets too high.  He typically does not eat a lot of food while in the hospital, which is the most likely reason for the weight loss.  I am not too concerned about this as he will gain the weight back.  I did refill his OxyContin and oxycodone today.    ______________________________ Josph Macho, M.D. PRE/MEDQ  D:  10/01/2011  T:  10/02/2011  Job:  [id number]

## 2011-10-03 LAB — IRON AND TIBC
%SAT: 39 % (ref 20–55)
TIBC: 309 ug/dL (ref 215–435)

## 2011-10-03 LAB — HEMOGLOBINOPATHY EVALUATION: Hgb S Quant: 35.4 % — ABNORMAL HIGH

## 2011-10-03 LAB — FERRITIN: Ferritin: 877 ng/mL — ABNORMAL HIGH (ref 22–322)

## 2011-10-03 LAB — RETICULOCYTES (CHCC): ABS Retic: 107.3 10*3/uL (ref 19.0–186.0)

## 2011-10-08 ENCOUNTER — Other Ambulatory Visit: Payer: Self-pay | Admitting: *Deleted

## 2011-10-21 NOTE — Discharge Summary (Signed)
[facility]charge Summary  Patient ID: [name] MRN: [id number] DOB/AGE: 32-30-1982 32 y.o.  Admit date: 09/10/2011 Discharge date: 09/11/2011   Discharge Diagnoses:  Sickle cell crisis, improved Diffuse arthralgias secondary to #1.    Discharged Condition: improved   Clinic Course: patient was transferred from the emergency room to the sickle cell Center for continued therapy. He was maintained on IV fluids with half-normal saline at 125 cc an hour. His pain was aggressively treated with Dilaudid 2-4 mg q.2 hours p.r.n. Pain. Patient required IV Zofran and IV Benadryl for control of nausea and pruritus respectively. With intensive therapy patient's pain did decrease to a 6/10. He remained afebrile during his unit stay. He felt that he can manage his pain at home with his home medications. Patient was ultimately discharged home improved.   Significant Diagnostic Studies: per admission H&P.  Disposition: 01-Home or Self Care  Followup with primary physician in 2 weeks time.  Discharge Medications: Folic acid 1 mg daily Hydroxyurea 1000 mg daily Multivitamins one daily OxyContin 80 mg q.12 hours Roxicodone 30 mg q.4-6 hours p.r.n. Severe pain   Signed: August Saucer, Kalyse Meharg 09/11/2011, 8:00 PM

## 2011-10-25 ENCOUNTER — Encounter: Payer: Self-pay | Admitting: *Deleted

## 2011-10-25 NOTE — Progress Notes (Signed)
Patient called.  Wants to know if it is ok to go skydiving this summer on vacation.  Dr. Lupita Leash states it is ok but not to go above 15,000 feet. Clent Demark this to patient

## 2011-10-30 ENCOUNTER — Other Ambulatory Visit: Payer: Self-pay | Admitting: *Deleted

## 2011-10-30 DIAGNOSIS — D5700 Hb-SS disease with crisis, unspecified: Secondary | ICD-10-CM

## 2011-10-30 MED ORDER — OXYCODONE HCL 80 MG PO TB12: 80.0000 mg | ORAL_TABLET | Freq: Two times a day (BID) | ORAL | Status: DC

## 2011-10-30 MED ORDER — OXYCODONE HCL 30 MG PO TABS: ORAL_TABLET | ORAL | Status: DC

## 2011-11-02 NOTE — Telephone Encounter (Signed)
error 

## 2011-11-13 ENCOUNTER — Other Ambulatory Visit (HOSPITAL_BASED_OUTPATIENT_CLINIC_OR_DEPARTMENT_OTHER): Payer: Medicare Other | Admitting: Lab

## 2011-11-13 ENCOUNTER — Ambulatory Visit (HOSPITAL_BASED_OUTPATIENT_CLINIC_OR_DEPARTMENT_OTHER): Payer: Medicare Other | Admitting: Hematology & Oncology

## 2011-11-13 VITALS — BP 115/67 | HR 78 | Temp 98.4°F | Resp 22 | Ht 65.0 in | Wt 150.0 lb

## 2011-11-13 DIAGNOSIS — D5720 Sickle-cell/Hb-C disease without crisis: Secondary | ICD-10-CM

## 2011-11-13 DIAGNOSIS — D5700 Hb-SS disease with crisis, unspecified: Secondary | ICD-10-CM

## 2011-11-13 LAB — CBC WITH DIFFERENTIAL (CANCER CENTER ONLY)
BASO#: 0 10*3/uL (ref 0.0–0.2)
Eosinophils Absolute: 0.1 10*3/uL (ref 0.0–0.5)
HGB: 13 g/dL (ref 13.0–17.1)
LYMPH#: 2.3 10*3/uL (ref 0.9–3.3)
MONO#: 1.1 10*3/uL — ABNORMAL HIGH (ref 0.1–0.9)
NEUT#: 6.1 10*3/uL (ref 1.5–6.5)
Platelets: 349 10*3/uL (ref 145–400)
RBC: 4.2 10*6/uL (ref 4.20–5.70)
WBC: 9.6 10*3/uL (ref 4.0–10.0)

## 2011-11-13 LAB — CHCC SATELLITE - SMEAR

## 2011-11-13 LAB — TECHNOLOGIST REVIEW CHCC SATELLITE: Tech Review: 5

## 2011-11-13 NOTE — Progress Notes (Signed)
This office note has been dictated.

## 2011-11-13 NOTE — Progress Notes (Signed)
DIAGNOSIS:  Hemoglobin SS disease.  CURRENT THERAPY: 1. Folic acid 1 mg p.o. daily. 2. Hydrea 500 mg p.o. b.i.d. 3. Phlebotomy to help prevent hyperviscosity.  INTERIM HISTORY:  [name] comes in for followup.  He really feels well.  He was hospitalized about 2 months ago.  He got an exchange transfusion while in the hospital.  He has had no problems with crises pain.  He has does have some occasional arthralgias.  He is on OxyContin and oxycodone.  These have really helped him.  His last ferritin was 877 back in June.  Iron saturation was only 39%.  His last hemoglobin electrophoresis showed 56% hemoglobin A and 35% hemoglobin S. back in June.  He has had no fever.  He actually went sky diving recently.  He had a good time doing this.  There has been no change in bowel or bladder habits.  He has had no nausea or vomiting.  There has been no headache. There has been no double vision or blurry vision.  PHYSICAL EXAMINATION:  This is a well-developed, well-nourished black gentleman in no obvious distress.  Vital signs:  Temperature of 98.4, pulse 78, respiratory rate 22, blood pressure 115/67.  Weight is 150. Head and neck:  Normocephalic, atraumatic skull.  There are no ocular or oral lesions.  There are no palpable cervical or supraclavicular lymph nodes.  Lungs:  Clear bilaterally.  Cardiac:  Regular rate and rhythm with a normal S1 and S2.  There are no murmurs, rubs or bruits. Abdomen:  Soft with good bowel sounds.  There is no palpable abdominal mass.  There is no fluid wave.  There is no palpable hepatosplenomegaly. Back:  No tenderness over the spine, ribs, or hips.  Extremities:  No clubbing, cyanosis or edema.  Neurological:  No focal neurological deficits.  Skin:  No rashes or ulcerations noted on his legs.  LABORATORY STUDIES:  White cell count is 9.6, hemoglobin 13, hematocrit 36, platelet count 349.  IMPRESSION:  [name] is a 31 year old African  American gentleman with hemoglobin SS disease.  He actually has been doing quite well.  He is hospitalized maybe 2-3 times a year.  We are watching his iron studies.  I do not see any evidence of iron overload right now.  I do not think we need to phlebotomize him.  He says he feels well. Usually he will tell us when he needs to be phlebotomized if he is having a lot of arthralgias.  We will plan to get him back in another 6 weeks.    ______________________________ Josph Macho, M.D. PRE/MEDQ  D:  11/13/2011  T:  11/13/2011  Job:  [id number]

## 2011-11-15 LAB — RETICULOCYTES (CHCC)
RBC.: 4.26 MIL/uL (ref 4.22–5.81)
Retic Ct Pct: 8.3 % — ABNORMAL HIGH (ref 0.4–2.3)

## 2011-11-15 LAB — HEMOGLOBINOPATHY EVALUATION
Hemoglobin Other: 0 %
Hgb A: 30.6 % — ABNORMAL LOW (ref 96.8–97.8)
Hgb S Quant: 58.3 % — ABNORMAL HIGH

## 2011-11-15 LAB — FERRITIN: Ferritin: 955 ng/mL — ABNORMAL HIGH (ref 22–322)

## 2011-11-20 IMAGING — CT CT HEAD W/O CM
1 series · 16 of 30 positions shown, 20 images · non-contrast
Comparison: None.

CLINICAL DATA: Altered mental status.  Fever.  Leukocytosis.
Seizure-like activity.

CT HEAD WITHOUT CONTRAST
TECHNIQUE: Contiguous axial images were obtained from the base of
the skull through the vertex without contrast.

[Series 2: head_seq 4.5 h37s st · axial · 0.43mm/px · z∈[-32,+112]mm · 16 of 36 slices shown, 20 images]
[im 2/36  brain]
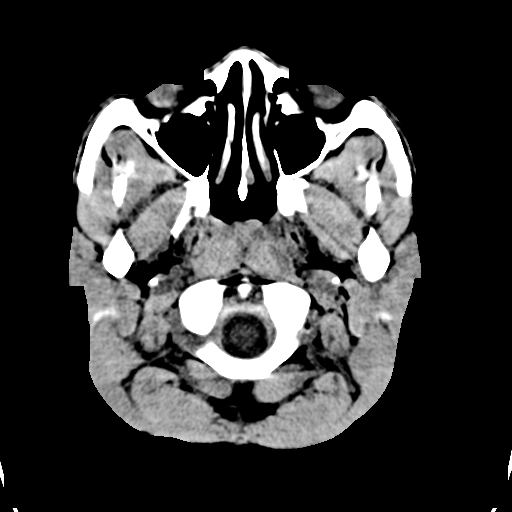
[im 2/36  bone]
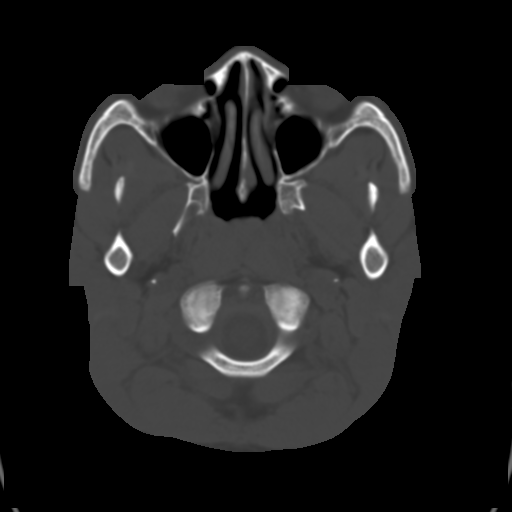
[im 4/36  brain]
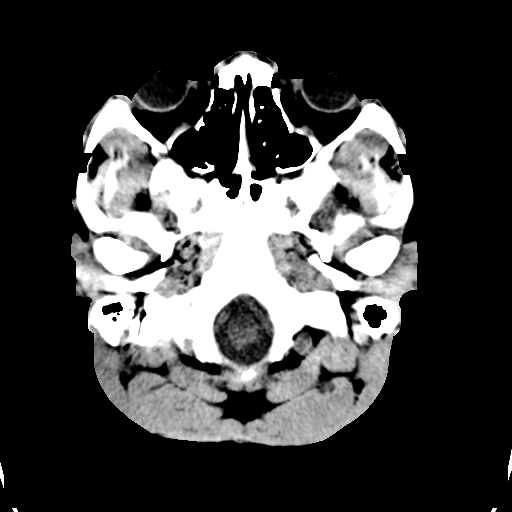
[im 7/36  brain]
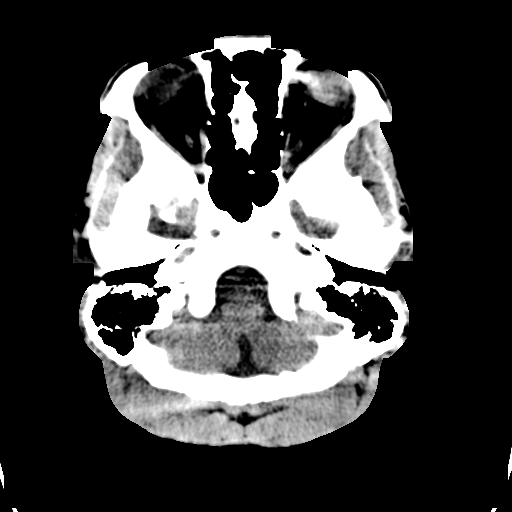
[im 9/36  brain]
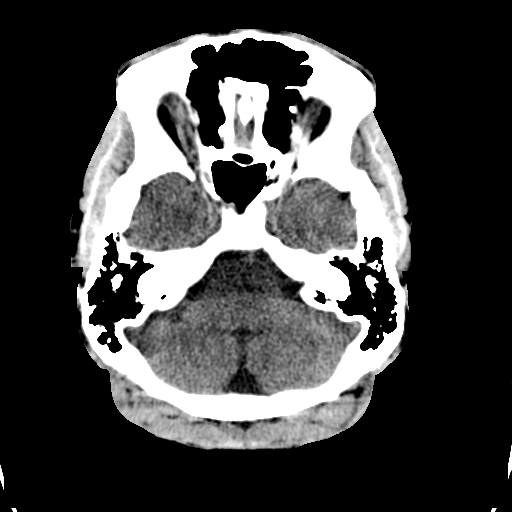
[im 10/36  brain]
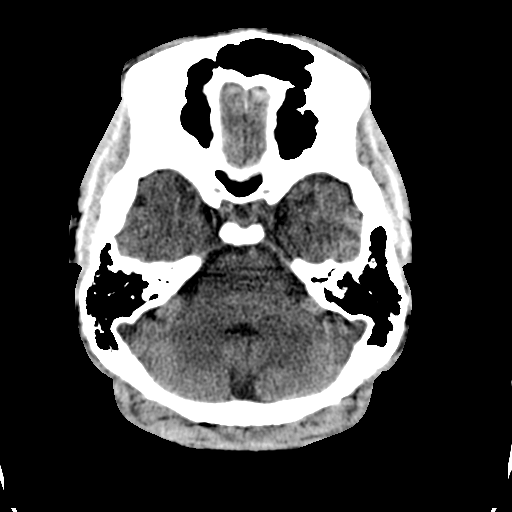
[im 10/36  bone]
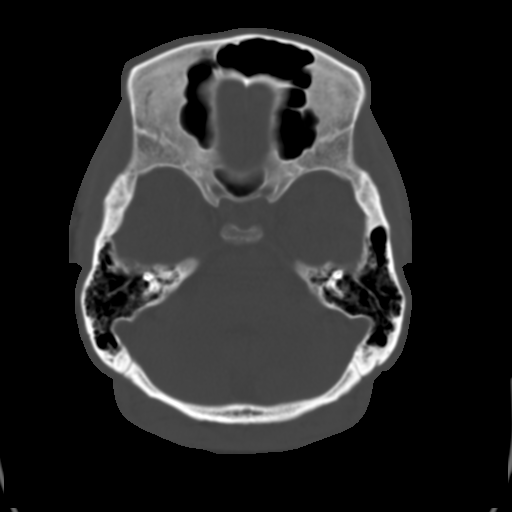
[im 13/36  brain]
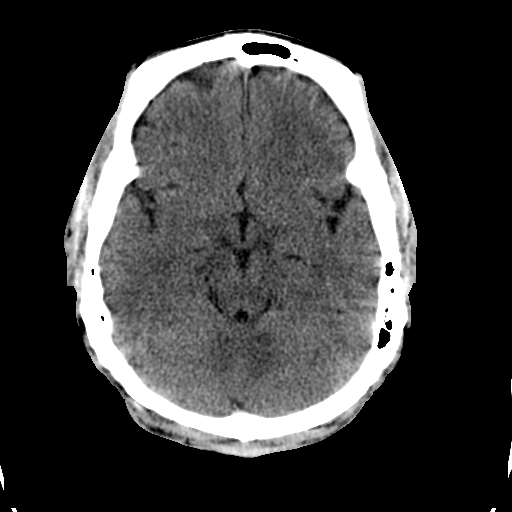
[im 15/36  brain]
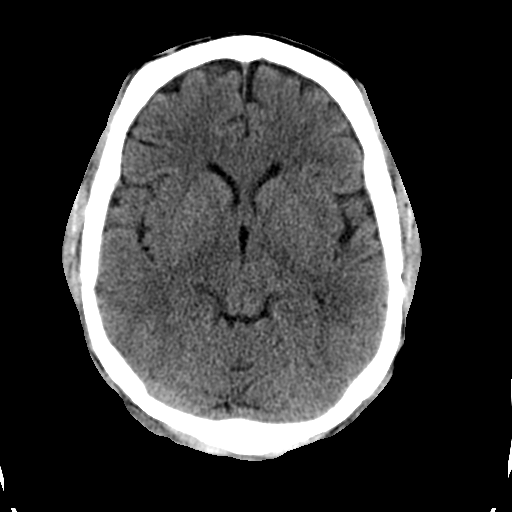
[im 17/36  brain]
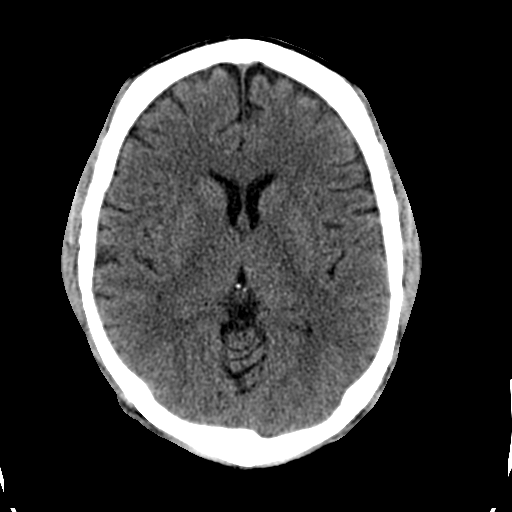
[im 19/36  brain]
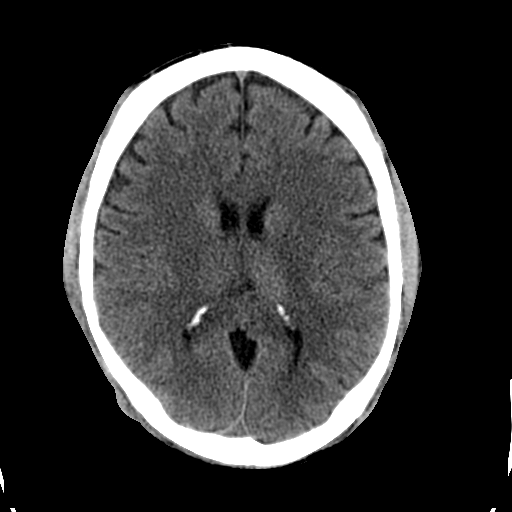
[im 19/36  bone]
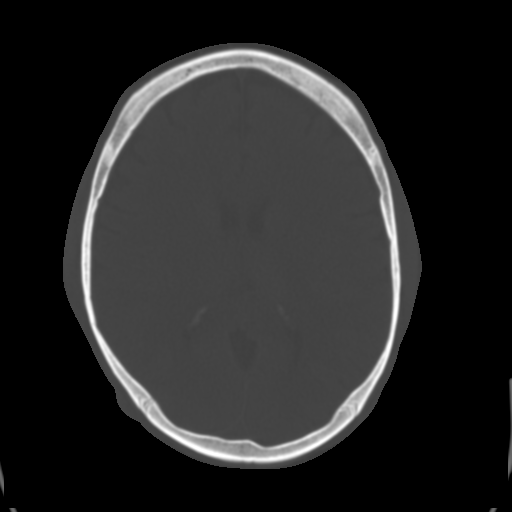
[im 21/36  brain]
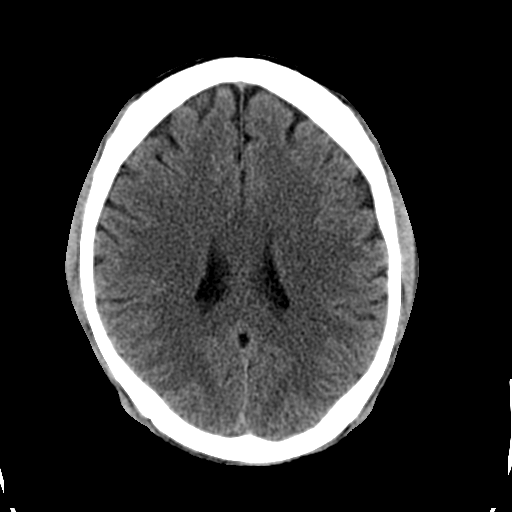
[im 23/36  brain]
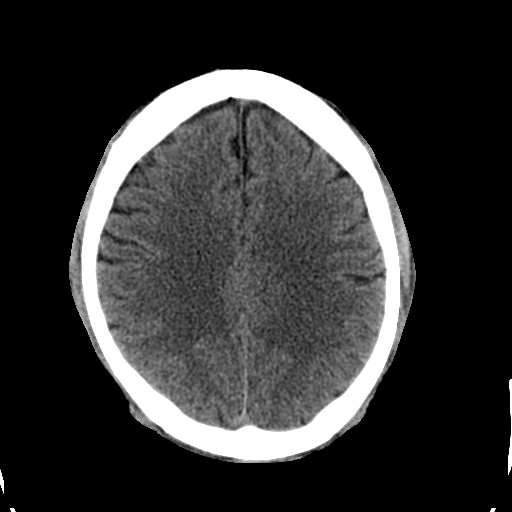
[im 26/36  brain]
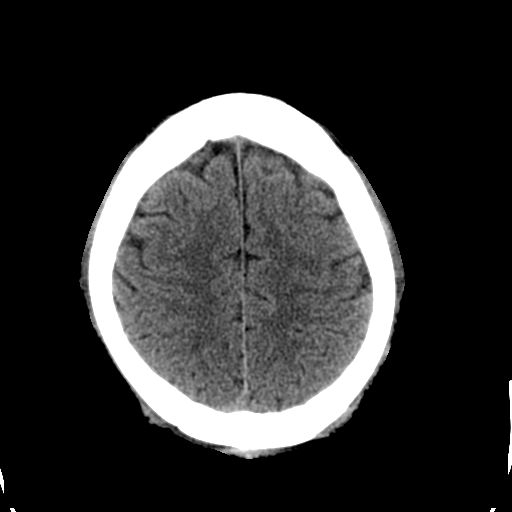
[im 27/36  brain]
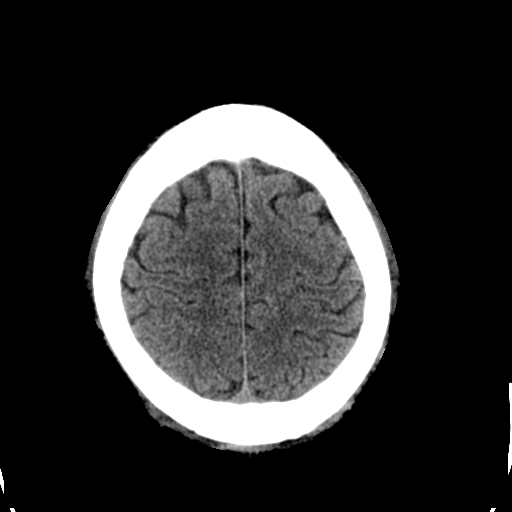
[im 27/36  bone]
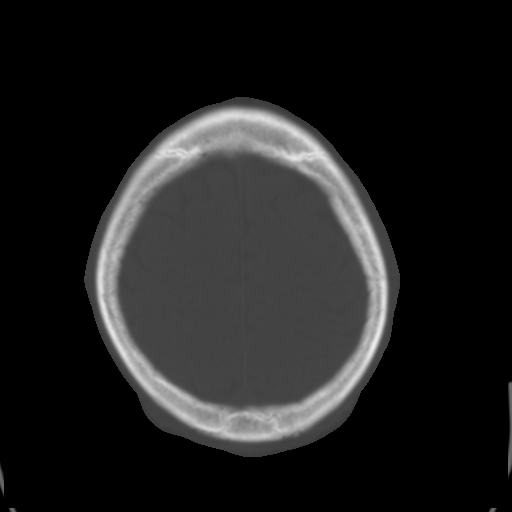
[im 29/36  brain]
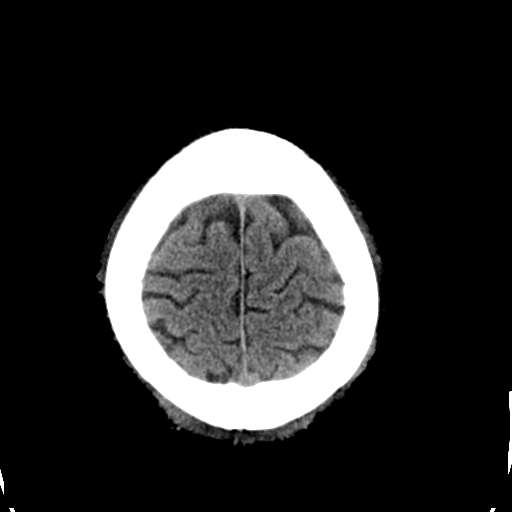
[im 32/36  brain]
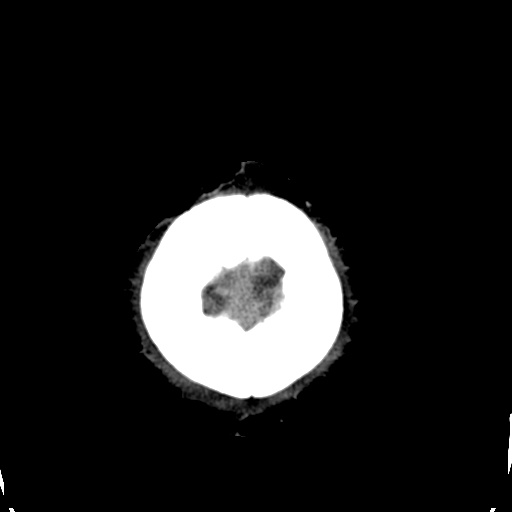
[im 34/36  brain]
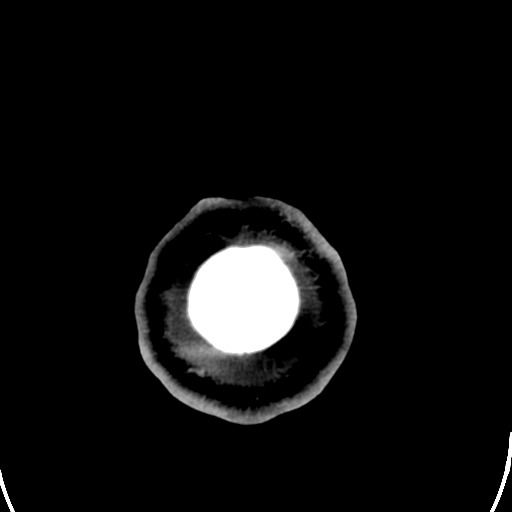

[16 of 30 positions shown; findings below may reference images not displayed]

FINDINGS: There is no acute intracranial hemorrhage, infarction, or
mass lesion.  Brain parenchyma is normal.  Osseous structures are
normal.
IMPRESSION: Normal exam.

## 2011-11-20 IMAGING — RF DG FLUORO GUIDE NDL PLC/BX
2 series · 2 of 2 positions shown · non-contrast
Comparison: None

CLINICAL DATA: Fever.  Elevated white blood count.

FLUORO GUIDED NEEDLE PLACEMENT

[Series 1: run · 1 of 1 slices shown (1 of 2)]
[im 1/1]
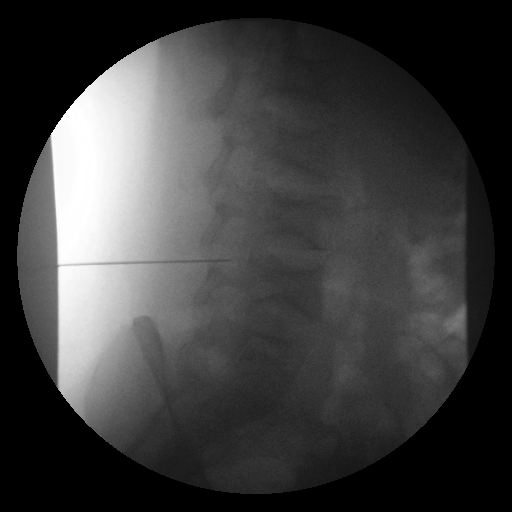

[Series 2: run · 1 of 1 slices shown (2 of 2)]
[im 1/1]
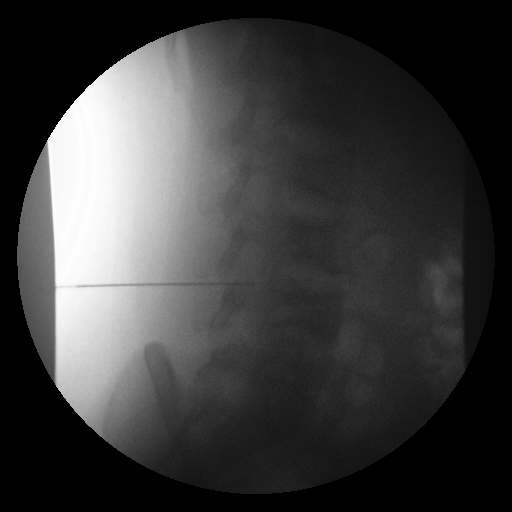

[2 of 2 positions shown; findings below may reference images not displayed]

FINDINGS: After explaining the purpose, procedure, risks, including
bleeding, infection, medication reaction, and headache, and after
obtaining written informed consent, using sterile technique and
local anesthesia I performed a lumbar puncture at the L3-4 level.
29 ml of clear colorless spinal fluid was obtained for laboratory
examination.  Opening pressure was 20.  Closing pressure was zero.

Fluoroscopy time:  0.6 minutes.
IMPRESSION: Diagnostic lumbar puncture performed without immediate
complications.

## 2011-11-21 IMAGING — CR DG CHEST 1V PORT
1 series · 1 of 1 positions shown · non-contrast
Comparison: Chest radiograph 07/08/2010

CLINICAL DATA: PICC line placement

PORTABLE CHEST - 1 VIEW

[AP]
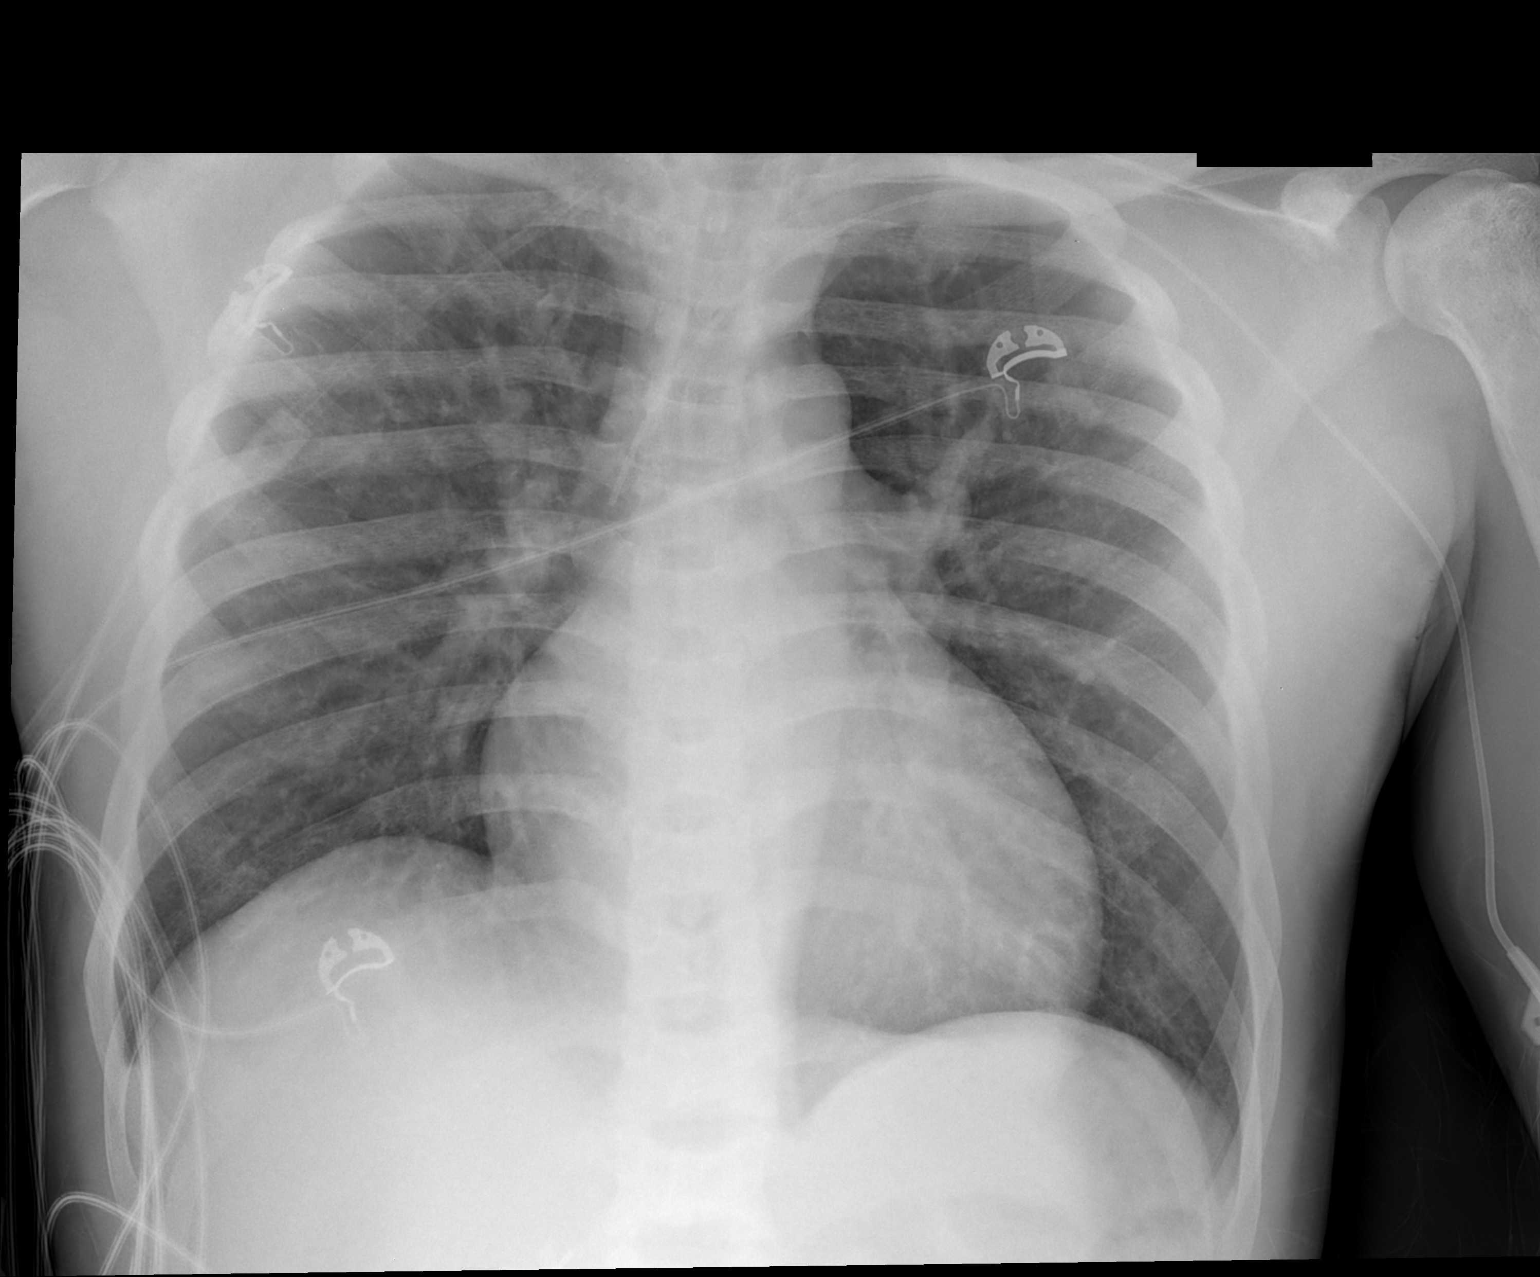

[1 of 1 positions shown; findings below may reference images not displayed]

FINDINGS: Stable enlarged cardiac silhouette.  There is interval
increase in bilateral air space disease as compared to prior.
Interval placement of a left PICC line with tip in the distal SVC
in good position.  Sclerotic changes of the humerus consistent
sickle cell disease.
IMPRESSION: 1.  Left PICC line in good position.
2.  Interval increase in central venous congestion / pulmonary
edema.

## 2011-11-29 ENCOUNTER — Other Ambulatory Visit: Payer: Self-pay | Admitting: *Deleted

## 2011-11-29 DIAGNOSIS — D5700 Hb-SS disease with crisis, unspecified: Secondary | ICD-10-CM

## 2011-11-29 MED ORDER — OXYCODONE HCL 30 MG PO TABS: ORAL_TABLET | ORAL | Status: DC

## 2011-11-29 MED ORDER — HYDROXYUREA 500 MG PO CAPS: 1000.0000 mg | ORAL_CAPSULE | Freq: Every day | ORAL | Status: DC

## 2011-11-29 MED ORDER — OXYCODONE HCL 80 MG PO TB12: 80.0000 mg | ORAL_TABLET | Freq: Two times a day (BID) | ORAL | Status: DC

## 2011-11-29 NOTE — Telephone Encounter (Signed)
Pt called this morning requesting rx for Oxycontin and Roxicodone. Also stated he needed Hydrea as well. He last had a rx for his narcotics on 7/22. Will route to Dr Myna Hidalgo for approval then place at the front desk for pick up.

## 2011-12-31 ENCOUNTER — Other Ambulatory Visit: Payer: Self-pay | Admitting: *Deleted

## 2011-12-31 DIAGNOSIS — D5700 Hb-SS disease with crisis, unspecified: Secondary | ICD-10-CM

## 2011-12-31 MED ORDER — OXYCODONE HCL 30 MG PO TABS: ORAL_TABLET | ORAL | Status: DC

## 2011-12-31 MED ORDER — OXYCODONE HCL 80 MG PO TB12: 80.0000 mg | ORAL_TABLET | Freq: Two times a day (BID) | ORAL | Status: DC

## 2011-12-31 NOTE — Telephone Encounter (Signed)
Pt called this morning requesting rx for Oxycontin and Roxicodone. Also stated he needed Hydrea as well. He last had a rx for his narcotics on 8/22. Will route to Dr Myna Hidalgo for approval then place at the front desk for pick up.

## 2012-01-15 ENCOUNTER — Ambulatory Visit (HOSPITAL_BASED_OUTPATIENT_CLINIC_OR_DEPARTMENT_OTHER): Payer: Medicare Other

## 2012-01-15 ENCOUNTER — Ambulatory Visit (HOSPITAL_BASED_OUTPATIENT_CLINIC_OR_DEPARTMENT_OTHER): Payer: Medicare Other | Admitting: Medical

## 2012-01-15 ENCOUNTER — Other Ambulatory Visit: Payer: Self-pay | Admitting: *Deleted

## 2012-01-15 ENCOUNTER — Other Ambulatory Visit (HOSPITAL_BASED_OUTPATIENT_CLINIC_OR_DEPARTMENT_OTHER): Payer: Medicare Other | Admitting: Lab

## 2012-01-15 DIAGNOSIS — D571 Sickle-cell disease without crisis: Secondary | ICD-10-CM

## 2012-01-15 DIAGNOSIS — D5720 Sickle-cell/Hb-C disease without crisis: Secondary | ICD-10-CM

## 2012-01-15 DIAGNOSIS — Z23 Encounter for immunization: Secondary | ICD-10-CM

## 2012-01-15 DIAGNOSIS — B54 Unspecified malaria: Secondary | ICD-10-CM

## 2012-01-15 DIAGNOSIS — D5700 Hb-SS disease with crisis, unspecified: Secondary | ICD-10-CM

## 2012-01-15 LAB — CBC WITH DIFFERENTIAL (CANCER CENTER ONLY)
BASO#: 0.1 10*3/uL (ref 0.0–0.2)
Eosinophils Absolute: 0.2 10*3/uL (ref 0.0–0.5)
HCT: 31.2 % — ABNORMAL LOW (ref 38.7–49.9)
HGB: 11.7 g/dL — ABNORMAL LOW (ref 13.0–17.1)
LYMPH#: 4.1 10*3/uL — ABNORMAL HIGH (ref 0.9–3.3)
MCHC: 37.5 g/dL — ABNORMAL HIGH (ref 32.0–35.9)
MONO#: 1.3 10*3/uL — ABNORMAL HIGH (ref 0.1–0.9)
NEUT#: 5.5 10*3/uL (ref 1.5–6.5)
NEUT%: 49.8 % (ref 40.0–80.0)
RBC: 3.63 10*6/uL — ABNORMAL LOW (ref 4.20–5.70)
WBC: 10.1 10*3/uL — ABNORMAL HIGH (ref 4.0–10.0)

## 2012-01-15 MED ORDER — ATOVAQUONE-PROGUANIL HCL 250-100 MG PO TABS: 1.0000 | ORAL_TABLET | Freq: Every day | ORAL | Status: DC

## 2012-01-15 MED ORDER — INFLUENZA VIRUS VACC SPLIT PF IM SUSP
0.5000 mL | Freq: Once | INTRAMUSCULAR | Status: AC
  Administered 2012-01-15: 0.5 mL via INTRAMUSCULAR
  Filled 2012-01-15: qty 0.5

## 2012-01-15 NOTE — Telephone Encounter (Signed)
Rx sent to pharmacy for malaria prevention as the pt is planning a trip to [place]. He plans to be gone for about 6 weeks. Wrote for 7 weeks per pharmacy and Dr Myna Hidalgo.

## 2012-01-15 NOTE — Progress Notes (Signed)
Diagnosis: Hemoglobin, SS.  Disease.  Current therapy: #1 folic acid 1 mg daily.  Number. #2 Hydrea, 500 mg by mouth twice a day. #3 phlebotomy to help prevent hyperviscosity.    Interim history: [name] presents today for an office followup visit.  Overall, he, reports, that he's been doing quite well.  He has not reported any new pounds with crisis pain.  The last, time, he was in the hospital.  For sickle cell crisis was back in June, and he had an exchange transfusion.  He does continue to have some occasional arthralgias and had OxyContin and oxycodone.  His ferritin level.  Back in August was 955.  His last hemoglobin, electrophoresis back in August revealed 31%, hemoglobin, a in 58%, hemoglobin S. , he does not report any recent illnesses.  He does not report any fever.  He has no change in bowel, bladder, habits.  He has a good appetite.  He denies any nausea, vomiting, diarrhea, constipation.  He denies any cough, chest pain, shortness of breath.  He denies any fevers, chills, or night sweats.  He denies any headaches or blurry or double vision.  He denies any rashes.  He, reports, he is actually getting ready to travel to see his family over in [place] and is asking for malaria prophylaxis medication.  Review of Systems: Pt. Denies any changes in their vision, hearing, adenopathy, fevers, chills, nausea, vomiting, diarrhea, constipation, chest pain, shortness of breath, passing blood, passing out, blacking out,  any changes in skin, joints, neurologic or psychiatric except as noted.  Physical Exam: This is a pleasant, 31 year old, well-developed, well-nourished, African American, gentleman, in no obvious distress Vitals: Temperature 90.6 degrees, pulse 76, respirations 18, blood pressure 107/60, weight 156 pounds HEENT reveals a normocephalic, atraumatic skull, no scleral icterus, no oral lesions  Neck is supple without any cervical or supraclavicular adenopathy.  Lungs are clear to  auscultation bilaterally. There are no wheezes, rales or rhonci Cardiac is regular rate and rhythm with a normal S1 and S2. There are no murmurs, rubs, or bruits.  Abdomen is soft with good bowel sounds, there is no palpable mass. There is no palpable hepatosplenomegaly. There is no palpable fluid wave.  Musculoskeletal no tenderness of the spine, ribs, or hips.  Extremities there are no clubbing, cyanosis, or edema.  Skin no petechia, purpura or ecchymosis Neurologic is nonfocal.  Laboratory Data: White count 10.1, hemoglobin 11.7, hematocrit 31.2, platelets 356,000  Current Outpatient Prescriptions on File Prior to Visit  Medication Sig Dispense Refill  . ALPRAZolam (XANAX) 1 MG tablet every morning.       . bismuth subsalicylate (PEPTO BISMOL) 262 MG/15ML suspension Take 15 mLs by mouth every 6 (six) hours as needed. Stomach upset      . Cyanocobalamin (VITAMIN B 12 PO) Take 1 tablet by mouth.      . folic acid (FOLVITE) 1 MG tablet Take 1 mg by mouth daily.        . hydroxyurea (HYDREA) 500 MG capsule Take 2 capsules (1,000 mg total) by mouth daily. May take with food to minimize GI side effects.  60 capsule  2  . Multiple Vitamin (MULITIVITAMIN WITH MINERALS) TABS Take 1 tablet by mouth daily.      Marland Kitchen oxyCODONE (OXYCONTIN) 80 MG 12 hr tablet Take 1 tablet (80 mg total) by mouth every 12 (twelve) hours.  60 tablet  0  . oxycodone (ROXICODONE) 30 MG immediate release tablet 1 - 2 tabs every 6 hours  prn sickle cell crisis pain  120 tablet  0   Assessment/Plan: This is a pleasant, 31 year old, African American, gentleman, with the following issues:  #1, hemoglobin, SS.  Disease.  He is actually been doing quite well.  His last hospitalization.  For sickle cell crisis was back in June.  He did have an exchange transfusion.  We will continue to watch his iron studies.  I do not see any evidence of iron overload.  Right now.  I do not think we need to phlebotomize him.  He, reports, he feels,  well.  Most of the time he tells [place] when he needs to be phlebotomized if he is having a lot of arthralgias.  #2, malaria prophylaxis medication-we will prescribe Malarone.  We will also give him a flu shot today.  #3 followup.  We will follow back up with [name] in 2 months, but before then should there be questions or concerns.

## 2012-01-17 LAB — HEMOGLOBINOPATHY EVALUATION
Hemoglobin Other: 0 %
Hgb A2 Quant: 3.2 % (ref 2.2–3.2)
Hgb A: 8.9 % — ABNORMAL LOW (ref 96.8–97.8)

## 2012-01-17 LAB — RETICULOCYTES (CHCC): ABS Retic: 797.9 10*3/uL — ABNORMAL HIGH (ref 19.0–186.0)

## 2012-01-17 LAB — FERRITIN: Ferritin: 347 ng/mL — ABNORMAL HIGH (ref 22–322)

## 2012-01-17 LAB — IRON AND TIBC
%SAT: 39 % (ref 20–55)
TIBC: 277 ug/dL (ref 215–435)
UIBC: 170 ug/dL (ref 125–400)

## 2012-01-30 ENCOUNTER — Other Ambulatory Visit: Payer: Self-pay | Admitting: *Deleted

## 2012-01-30 DIAGNOSIS — R52 Pain, unspecified: Secondary | ICD-10-CM

## 2012-01-30 DIAGNOSIS — D5700 Hb-SS disease with crisis, unspecified: Secondary | ICD-10-CM

## 2012-01-30 MED ORDER — OXYCODONE HCL 80 MG PO TB12: 80.0000 mg | ORAL_TABLET | Freq: Two times a day (BID) | ORAL | Status: DC

## 2012-01-30 MED ORDER — OXYCODONE HCL 30 MG PO TABS: 30.0000 mg | ORAL_TABLET | Freq: Four times a day (QID) | ORAL | Status: DC | PRN

## 2012-02-04 ENCOUNTER — Emergency Department (HOSPITAL_COMMUNITY): Payer: Medicare Other

## 2012-02-04 ENCOUNTER — Inpatient Hospital Stay (HOSPITAL_COMMUNITY)
Admission: EM | Admit: 2012-02-04 | Discharge: 2012-02-15 | DRG: 811 | Disposition: A | Discharge status: Home / Self Care | Payer: Medicare Other | Attending: Hematology & Oncology | Admitting: Hematology & Oncology

## 2012-02-04 ENCOUNTER — Encounter (HOSPITAL_COMMUNITY): Payer: Self-pay

## 2012-02-04 ENCOUNTER — Ambulatory Visit (HOSPITAL_COMMUNITY): Payer: Medicare Other

## 2012-02-04 DIAGNOSIS — R52 Pain, unspecified: Secondary | ICD-10-CM

## 2012-02-04 DIAGNOSIS — Z22322 Carrier or suspected carrier of Methicillin resistant Staphylococcus aureus: Secondary | ICD-10-CM

## 2012-02-04 DIAGNOSIS — D649 Anemia, unspecified: Secondary | ICD-10-CM | POA: Diagnosis present

## 2012-02-04 DIAGNOSIS — R410 Disorientation, unspecified: Secondary | ICD-10-CM

## 2012-02-04 DIAGNOSIS — Z79899 Other long term (current) drug therapy: Secondary | ICD-10-CM

## 2012-02-04 DIAGNOSIS — E872 Acidosis: Secondary | ICD-10-CM

## 2012-02-04 DIAGNOSIS — E876 Hypokalemia: Secondary | ICD-10-CM | POA: Diagnosis not present

## 2012-02-04 DIAGNOSIS — L299 Pruritus, unspecified: Secondary | ICD-10-CM | POA: Diagnosis present

## 2012-02-04 DIAGNOSIS — E860 Dehydration: Secondary | ICD-10-CM | POA: Diagnosis present

## 2012-02-04 DIAGNOSIS — D5700 Hb-SS disease with crisis, unspecified: Secondary | ICD-10-CM | POA: Diagnosis present

## 2012-02-04 DIAGNOSIS — G9349 Other encephalopathy: Secondary | ICD-10-CM | POA: Diagnosis not present

## 2012-02-04 DIAGNOSIS — J9602 Acute respiratory failure with hypercapnia: Secondary | ICD-10-CM | POA: Diagnosis present

## 2012-02-04 DIAGNOSIS — M87059 Idiopathic aseptic necrosis of unspecified femur: Secondary | ICD-10-CM | POA: Diagnosis present

## 2012-02-04 DIAGNOSIS — D72829 Elevated white blood cell count, unspecified: Secondary | ICD-10-CM | POA: Diagnosis present

## 2012-02-04 DIAGNOSIS — D5701 Hb-SS disease with acute chest syndrome: Secondary | ICD-10-CM

## 2012-02-04 DIAGNOSIS — R7309 Other abnormal glucose: Secondary | ICD-10-CM | POA: Diagnosis not present

## 2012-02-04 DIAGNOSIS — E8729 Other acidosis: Secondary | ICD-10-CM | POA: Diagnosis present

## 2012-02-04 DIAGNOSIS — J9600 Acute respiratory failure, unspecified whether with hypoxia or hypercapnia: Secondary | ICD-10-CM | POA: Diagnosis present

## 2012-02-04 DIAGNOSIS — R6510 Systemic inflammatory response syndrome (SIRS) of non-infectious origin without acute organ dysfunction: Secondary | ICD-10-CM | POA: Diagnosis present

## 2012-02-04 DIAGNOSIS — J189 Pneumonia, unspecified organism: Secondary | ICD-10-CM

## 2012-02-04 LAB — COMPREHENSIVE METABOLIC PANEL WITH GFR
ALT: 42 U/L (ref 0–53)
AST: 123 U/L — ABNORMAL HIGH (ref 0–37)
Albumin: 4.5 g/dL (ref 3.5–5.2)
Alkaline Phosphatase: 105 U/L (ref 39–117)
BUN: 17 mg/dL (ref 6–23)
CO2: 24 meq/L (ref 19–32)
Calcium: 9.3 mg/dL (ref 8.4–10.5)
Chloride: 101 meq/L (ref 96–112)
Creatinine, Ser: 1.55 mg/dL — ABNORMAL HIGH (ref 0.50–1.35)
GFR calc Af Amer: 67 mL/min — ABNORMAL LOW
GFR calc non Af Amer: 58 mL/min — ABNORMAL LOW
Glucose, Bld: 120 mg/dL — ABNORMAL HIGH (ref 70–99)
Potassium: 4.6 meq/L (ref 3.5–5.1)
Sodium: 136 meq/L (ref 135–145)
Total Bilirubin: 4.4 mg/dL — ABNORMAL HIGH (ref 0.3–1.2)
Total Protein: 7.3 g/dL (ref 6.0–8.3)

## 2012-02-04 LAB — RETICULOCYTES
RBC.: 3.14 MIL/uL — ABNORMAL LOW (ref 4.22–5.81)
Retic Count, Absolute: 778.7 10*3/uL — ABNORMAL HIGH (ref 19.0–186.0)
Retic Ct Pct: 24.8 % — ABNORMAL HIGH (ref 0.4–3.1)

## 2012-02-04 LAB — AMMONIA: Ammonia: 65 umol/L — ABNORMAL HIGH (ref 11–60)

## 2012-02-04 LAB — URINALYSIS, ROUTINE W REFLEX MICROSCOPIC
Bilirubin Urine: NEGATIVE
Ketones, ur: NEGATIVE mg/dL
Nitrite: NEGATIVE
Urobilinogen, UA: 1 mg/dL (ref 0.0–1.0)

## 2012-02-04 LAB — BLOOD GAS, ARTERIAL
Acid-base deficit: 2.4 mmol/L — ABNORMAL HIGH (ref 0.0–2.0)
Bicarbonate: 23.7 meq/L (ref 20.0–24.0)
Drawn by: 129801
FIO2: 0.21 %
O2 Saturation: 71.9 %
Patient temperature: 98.6
TCO2: 23.1 mmol/L (ref 0–100)
pCO2 arterial: 51 mmHg — ABNORMAL HIGH (ref 35.0–45.0)
pH, Arterial: 7.289 — ABNORMAL LOW (ref 7.350–7.450)
pO2, Arterial: 55.8 mmHg — ABNORMAL LOW (ref 80.0–100.0)

## 2012-02-04 LAB — CBC WITH DIFFERENTIAL/PLATELET
Eosinophils Absolute: 0.2 10*3/uL (ref 0.0–0.7)
HCT: 25.5 % — ABNORMAL LOW (ref 39.0–52.0)
Lymphocytes Relative: 15 % (ref 12–46)
Lymphs Abs: 2.3 10*3/uL (ref 0.7–4.0)
MCH: 31.2 pg (ref 26.0–34.0)
MCHC: 38.4 g/dL — ABNORMAL HIGH (ref 30.0–36.0)
Monocytes Absolute: 2.5 10*3/uL — ABNORMAL HIGH (ref 0.1–1.0)
RDW: 24.4 % — ABNORMAL HIGH (ref 11.5–15.5)
nRBC: 34 /100 WBC — ABNORMAL HIGH

## 2012-02-04 LAB — PROCALCITONIN: Procalcitonin: 0.25 ng/mL

## 2012-02-04 LAB — EXPECTORATED SPUTUM ASSESSMENT W GRAM STAIN, RFLX TO RESP C

## 2012-02-04 LAB — BASIC METABOLIC PANEL
GFR calc Af Amer: >90 mL/min (ref >90)
GFR calc non Af Amer: 83 mL/min — ABNORMAL LOW (ref >90)
Potassium: 4.3 mEq/L (ref 3.5–5.1)
Sodium: 138 mEq/L (ref 135–145)

## 2012-02-04 LAB — CBC
MCHC: 38.2 g/dL — ABNORMAL HIGH (ref 30.0–36.0)
RDW: 24.8 % — ABNORMAL HIGH (ref 11.5–15.5)

## 2012-02-04 LAB — D-DIMER, QUANTITATIVE: D-Dimer, Quant: 7.8 ug/mL-FEU — ABNORMAL HIGH (ref 0.00–0.48)

## 2012-02-04 LAB — URINE MICROSCOPIC-ADD ON

## 2012-02-04 LAB — TROPONIN I
Troponin I: <0.3 ng/mL (ref <0.30)
Troponin I: <0.3 ng/mL (ref <0.30)

## 2012-02-04 LAB — PRO B NATRIURETIC PEPTIDE: Pro B Natriuretic peptide (BNP): 158.2 pg/mL — ABNORMAL HIGH (ref 0–125)

## 2012-02-04 LAB — MRSA PCR SCREENING: MRSA by PCR: POSITIVE — AB

## 2012-02-04 MED ORDER — HYDROMORPHONE HCL PF 2 MG/ML IJ SOLN: 6.0000 mg | INTRAMUSCULAR | Status: DC | PRN

## 2012-02-04 MED ORDER — SODIUM CHLORIDE 0.9 % IV BOLUS (SEPSIS): 1000.0000 mL | Freq: Once | INTRAVENOUS | Status: AC

## 2012-02-04 MED ORDER — PANTOPRAZOLE SODIUM 40 MG IV SOLR
40.0000 mg | Freq: Every day | INTRAVENOUS | Status: DC
  Filled 2012-02-04 (×3): qty 40

## 2012-02-04 MED ORDER — SODIUM CHLORIDE 0.9 % IV SOLN
INTRAVENOUS | Status: AC
  Administered 2012-02-04: 18:00:00 via INTRAVENOUS

## 2012-02-04 MED ORDER — ACETAMINOPHEN 650 MG RE SUPP: 650.0000 mg | RECTAL | Status: DC | PRN

## 2012-02-04 MED ORDER — ADULT MULTIVITAMIN W/MINERALS CH
1.0000 | ORAL_TABLET | Freq: Every day | ORAL | Status: DC
  Administered 2012-02-04 – 2012-02-13 (×10): 1 via ORAL
  Filled 2012-02-04 (×11): qty 1

## 2012-02-04 MED ORDER — ENOXAPARIN SODIUM 80 MG/0.8ML ~~LOC~~ SOLN
70.0000 mg | SUBCUTANEOUS | Status: AC
  Administered 2012-02-04: 70 mg via SUBCUTANEOUS
  Filled 2012-02-04: qty 0.8

## 2012-02-04 MED ORDER — BISACODYL 5 MG PO TBEC: 10.0000 mg | DELAYED_RELEASE_TABLET | Freq: Every day | ORAL | Status: DC | PRN

## 2012-02-04 MED ORDER — PANTOPRAZOLE SODIUM 40 MG PO TBEC
40.0000 mg | DELAYED_RELEASE_TABLET | Freq: Every day | ORAL | Status: DC
  Administered 2012-02-04 – 2012-02-15 (×12): 40 mg via ORAL
  Filled 2012-02-04 (×12): qty 1

## 2012-02-04 MED ORDER — HYDROMORPHONE HCL PF 1 MG/ML IJ SOLN
1.0000 mg | INTRAMUSCULAR | Status: DC | PRN
  Administered 2012-02-04: 2 mg via INTRAVENOUS
  Filled 2012-02-04: qty 2

## 2012-02-04 MED ORDER — HYDROMORPHONE HCL PF 2 MG/ML IJ SOLN
4.0000 mg | INTRAMUSCULAR | Status: DC | PRN
  Administered 2012-02-04 – 2012-02-05 (×2): 4 mg via INTRAVENOUS
  Filled 2012-02-04 (×2): qty 2

## 2012-02-04 MED ORDER — LORAZEPAM 2 MG/ML IJ SOLN
0.5000 mg | INTRAMUSCULAR | Status: DC | PRN
  Administered 2012-02-07: 1 mg via INTRAVENOUS
  Filled 2012-02-04: qty 1

## 2012-02-04 MED ORDER — SODIUM CHLORIDE 0.9 % IV SOLN
150.0000 mg | Freq: Once | INTRAVENOUS | Status: AC
  Administered 2012-02-04: 150 mg via INTRAVENOUS
  Filled 2012-02-04: qty 5

## 2012-02-04 MED ORDER — DOCUSATE SODIUM 100 MG PO CAPS
100.0000 mg | ORAL_CAPSULE | Freq: Two times a day (BID) | ORAL | Status: DC
  Administered 2012-02-04 – 2012-02-15 (×20): 100 mg via ORAL
  Filled 2012-02-04 (×23): qty 1

## 2012-02-04 MED ORDER — FOLIC ACID 1 MG PO TABS
1.0000 mg | ORAL_TABLET | Freq: Every day | ORAL | Status: DC
  Administered 2012-02-04: 1 mg via ORAL
  Filled 2012-02-04 (×2): qty 1

## 2012-02-04 MED ORDER — CHLORHEXIDINE GLUCONATE CLOTH 2 % EX PADS: 6.0000 | MEDICATED_PAD | Freq: Every day | CUTANEOUS | Status: DC

## 2012-02-04 MED ORDER — ENOXAPARIN SODIUM 80 MG/0.8ML ~~LOC~~ SOLN
70.0000 mg | Freq: Two times a day (BID) | SUBCUTANEOUS | Status: DC
  Administered 2012-02-05 – 2012-02-15 (×21): 70 mg via SUBCUTANEOUS
  Filled 2012-02-04 (×23): qty 0.8

## 2012-02-04 MED ORDER — LEVOFLOXACIN IN D5W 750 MG/150ML IV SOLN
750.0000 mg | INTRAVENOUS | Status: DC
  Administered 2012-02-04 – 2012-02-06 (×3): 750 mg via INTRAVENOUS
  Filled 2012-02-04 (×3): qty 150

## 2012-02-04 MED ORDER — ONDANSETRON HCL 4 MG PO TABS: 4.0000 mg | ORAL_TABLET | ORAL | Status: DC | PRN

## 2012-02-04 MED ORDER — LORAZEPAM 0.5 MG PO TABS
0.5000 mg | ORAL_TABLET | ORAL | Status: DC | PRN
  Administered 2012-02-05 (×2): 0.5 mg via ORAL
  Filled 2012-02-04 (×3): qty 1

## 2012-02-04 MED ORDER — LEVOFLOXACIN IN D5W 750 MG/150ML IV SOLN: 750.0000 mg | INTRAVENOUS | Status: DC

## 2012-02-04 MED ORDER — ONDANSETRON HCL 4 MG/2ML IJ SOLN
4.0000 mg | Freq: Once | INTRAMUSCULAR | Status: AC
  Administered 2012-02-04: 4 mg via INTRAVENOUS
  Filled 2012-02-04: qty 2

## 2012-02-04 MED ORDER — ONDANSETRON HCL 4 MG/2ML IJ SOLN: 4.0000 mg | INTRAMUSCULAR | Status: DC | PRN

## 2012-02-04 MED ORDER — MUPIROCIN 2 % EX OINT
1.0000 "application " | TOPICAL_OINTMENT | Freq: Two times a day (BID) | CUTANEOUS | Status: AC
  Administered 2012-02-04 – 2012-02-09 (×10): 1 via NASAL
  Filled 2012-02-04 (×2): qty 22

## 2012-02-04 MED ORDER — SODIUM CHLORIDE 0.9 % IV SOLN
INTRAVENOUS | Status: DC
  Administered 2012-02-04 – 2012-02-06 (×3): 125 mL/h via INTRAVENOUS

## 2012-02-04 MED ORDER — DIPHENHYDRAMINE HCL 50 MG/ML IJ SOLN
25.0000 mg | Freq: Once | INTRAMUSCULAR | Status: AC
  Administered 2012-02-04: 25 mg via INTRAVENOUS
  Filled 2012-02-04: qty 1

## 2012-02-04 MED ORDER — HYDROXYZINE HCL 25 MG PO TABS
25.0000 mg | ORAL_TABLET | ORAL | Status: DC | PRN
  Administered 2012-02-04 – 2012-02-07 (×10): 50 mg via ORAL
  Filled 2012-02-04 (×9): qty 2

## 2012-02-04 MED ORDER — OXYCODONE HCL ER 20 MG PO T12A
80.0000 mg | EXTENDED_RELEASE_TABLET | Freq: Two times a day (BID) | ORAL | Status: DC
  Filled 2012-02-04: qty 4

## 2012-02-04 MED ORDER — ONDANSETRON HCL 4 MG/2ML IJ SOLN
4.0000 mg | Freq: Three times a day (TID) | INTRAMUSCULAR | Status: AC | PRN
  Administered 2012-02-04: 4 mg via INTRAVENOUS
  Filled 2012-02-04: qty 2

## 2012-02-04 MED ORDER — FLEET ENEMA 7-19 GM/118ML RE ENEM: 1.0000 | ENEMA | Freq: Once | RECTAL | Status: AC | PRN

## 2012-02-04 MED ORDER — SODIUM CHLORIDE 0.9 % IV BOLUS (SEPSIS)
1000.0000 mL | Freq: Once | INTRAVENOUS | Status: AC
  Administered 2012-02-04: 1000 mL via INTRAVENOUS

## 2012-02-04 MED ORDER — ACETAMINOPHEN 325 MG PO TABS
650.0000 mg | ORAL_TABLET | ORAL | Status: DC | PRN
  Administered 2012-02-04 – 2012-02-05 (×2): 650 mg via ORAL
  Filled 2012-02-04 (×2): qty 2

## 2012-02-04 MED ORDER — HYDROXYUREA 500 MG PO CAPS
1000.0000 mg | ORAL_CAPSULE | Freq: Every day | ORAL | Status: DC
  Administered 2012-02-05 – 2012-02-15 (×11): 1000 mg via ORAL
  Filled 2012-02-04 (×12): qty 2

## 2012-02-04 MED ORDER — OXYCODONE HCL 5 MG PO TABS
30.0000 mg | ORAL_TABLET | Freq: Four times a day (QID) | ORAL | Status: DC | PRN
  Administered 2012-02-04: 30 mg via ORAL
  Filled 2012-02-04: qty 6

## 2012-02-04 MED ORDER — HYDROMORPHONE HCL PF 1 MG/ML IJ SOLN
1.0000 mg | INTRAMUSCULAR | Status: DC | PRN
  Administered 2012-02-04: 2 mg via INTRAVENOUS
  Filled 2012-02-04: qty 1

## 2012-02-04 MED ORDER — FENTANYL CITRATE 0.05 MG/ML IJ SOLN
100.0000 ug | Freq: Once | INTRAMUSCULAR | Status: AC
  Administered 2012-02-04: 100 ug via INTRAVENOUS
  Filled 2012-02-04: qty 2

## 2012-02-04 MED ORDER — PREGABALIN 50 MG PO CAPS
50.0000 mg | ORAL_CAPSULE | Freq: Every day | ORAL | Status: DC
  Administered 2012-02-04: 50 mg via ORAL
  Filled 2012-02-04: qty 1

## 2012-02-04 MED ORDER — POLYETHYLENE GLYCOL 3350 17 G PO PACK
17.0000 g | PACK | Freq: Every day | ORAL | Status: DC | PRN
  Filled 2012-02-04: qty 1

## 2012-02-04 MED ORDER — HYDROMORPHONE HCL PF 1 MG/ML IJ SOLN
1.0000 mg | INTRAMUSCULAR | Status: DC | PRN
  Administered 2012-02-04: 1 mg via INTRAVENOUS
  Filled 2012-02-04: qty 1

## 2012-02-04 MED ORDER — FENTANYL CITRATE 0.05 MG/ML IJ SOLN
50.0000 ug | Freq: Once | INTRAMUSCULAR | Status: AC
  Administered 2012-02-04: 50 ug via INTRAVENOUS
  Filled 2012-02-04: qty 2

## 2012-02-04 MED ORDER — MAGNESIUM SULFATE 40 MG/ML IJ SOLN
2.0000 g | Freq: Once | INTRAMUSCULAR | Status: AC
  Administered 2012-02-04: 2 g via INTRAVENOUS
  Filled 2012-02-04: qty 50

## 2012-02-04 NOTE — Progress Notes (Signed)
ANTICOAGULATION CONSULT NOTE - Initial Consult  Pharmacy Consult for Lovenox Indication: r/o PE   Allergies  Allergen Reactions  . Morphine And Related Rash  . Dilaudid (Hydromorphone Hcl)     dilusional only with 4mg  dose. Can't take "too much"  . Promethazine Hcl Hives    Patient Measurements:   Weight: 72.3kg  Vital Signs: Temp: 99.9 F (37.7 C) (10/28 1023) Temp src: Oral (10/28 1023) BP: 138/84 mmHg (10/28 1500) Pulse Rate: 92  (10/28 1500)  Labs:  Basename 02/04/12 1310 02/04/12 1100  HGB -- 9.8*  HCT -- 25.5*  PLT -- 271  APTT -- --  LABPROT -- --  INR -- --  HEPARINUNFRC -- --  CREATININE -- 1.55*  CKTOTAL -- --  CKMB -- --  TROPONINI <0.30 --    The CrCl is unknown because both a height and weight (above a minimum accepted value) are required for this calculation.   Medical History: Past Medical History  Diagnosis Date  . Sickle cell anemia    Assessment:  31 yom with h/o sickle cell anemia presented 10/28 with R groin, hip and lower back pain.  In ER, noted low O2 saturation.  CCM on board for management of acute resp.failure.  Hematology consulted and recommended therapeutic Lovenox to r/o PE given hypercoagulable state.   Per RN (Susan/Kimberly), pt wts 73.2 kg.  Scr 1.55.  CrCl ~60 ml/min.   Hgb 9.8 - awaiting hematology recs regarding exchange transfusion.   Goal of Therapy:  Anti-Xa level 0.6-1.2 units/ml 4hrs after LMWH dose given Monitor platelets by anticoagulation protocol: Yes   Plan:   Lovenox 70 sq q12h, first dose now   CBC q72 hours while on Lovenox  F/u work-up to r/o PE  Geoffry Paradise Thi 02/04/2012,4:11 PM

## 2012-02-04 NOTE — ED Notes (Signed)
Patient transported to CT 

## 2012-02-04 NOTE — ED Notes (Signed)
Patient c/o sickle cell pain mid back and right hip. Patient states he last took oxycontin at 0700 with no relief. Patient states he began having pain 2 days ago.

## 2012-02-04 NOTE — H&P (Signed)
[facility] History and Physical  [name] MVH:[id number] DOB: November 14, 1980 DOA: 02/04/2012  Referring physician: Shepard [name] PCP: [name] Cashing, MD  Specialists: Critical care medicine: Dr. Kendrick [name] Hematology oncology: Dr. Arlan [name]  Chief Complaint: Right groin and hip pain and lower back pain  HPI: [name] is a 31 y.o. male with known history of hemoglobin SS sickle cell disease, status post cholecystectomy who presents to the ED with a 2 to three-day history of progressive right groin and hip pain and lower back pain unrelieved with oral home medications. Patient states approximately a week prior to admission had a productive cough of greenish sputum was seen by his PCP placed on Mucinex as well as a Z-Pak. Patient denies any fever, no chills, no chest pain, no shortness of breath, no diarrhea, no constipation, no abdominal pain. Patient does endorse 3 episodes of emesis last episode one day prior to admission. Patient also does endorse some generalized weakness. Patient denies any hematemesis, no melena, no hematochezia. Patient presented to the ED and on presentation was noted to have an oxygen saturation of 77% on room air. Patient was given some IV fluids and placed on nasal cannula. Chest x-ray which was done was negative for any infiltrate. CBC which was done did have a leukocytosis with a white count of 15.6. Reticulocyte count obtained showed an elevated reticulocyte count of 778.7. Comprehensive metabolic profile done did have a creatinine of 1.55 with an AST of 123 and ALT of 42 otherwise was within normal limits. Will call to admit the patient for further evaluation and management. EKG was not done at the time of admission. ABG was also not done and this was requested and ordered per ED nurse practitioner. At bedside patient is in pain.   Review of Systems: The patient denies anorexia, fever, weight loss,, vision loss, decreased hearing, hoarseness, chest  pain, syncope, dyspnea on exertion, peripheral edema, balance deficits, hemoptysis, abdominal pain, melena, hematochezia, severe indigestion/heartburn, hematuria, incontinence, genital sores, muscle weakness, suspicious skin lesions, transient blindness, difficulty walking, depression, unusual weight change, abnormal bleeding, enlarged lymph nodes, angioedema, and breast masses.    Past Medical History  Diagnosis Date  . Sickle cell anemia    Past Surgical History  Procedure Date  . Cholecystectomy    Social History:  reports that he has never smoked. He has never used smokeless tobacco. He reports that he drinks alcohol. He reports that he does not use illicit drugs.  Allergies  Allergen Reactions  . Morphine And Related Rash  . Dilaudid (Hydromorphone Hcl)     dilusional only with 4mg  dose. Can't take "too much"  . Promethazine Hcl Hives    Family History  Problem Relation Age of Onset  . Sickle cell trait       Prior to Admission medications   Medication Sig Start Date End Date Taking? Authorizing Provider  ALPRAZolam (XANAX) 1 MG tablet 1 mg 3 (three) times daily as needed.  10/31/11  Yes [name] Macho, MD  Cyanocobalamin (VITAMIN B 12 PO) Take 1 tablet by mouth.   Yes Historical Provider, MD  folic acid (FOLVITE) 1 MG tablet Take 1 mg by mouth daily.     Yes Historical Provider, MD  hydroxyurea (HYDREA) 500 MG capsule Take 2 capsules (1,000 mg total) by mouth daily. May take with food to minimize GI side effects. 11/29/11  Yes [name] Macho, MD  Multiple Vitamin (MULITIVITAMIN WITH MINERALS) TABS Take 1 tablet by mouth daily.  Yes Historical Provider, MD  oxyCODONE (OXYCONTIN) 80 MG 12 hr tablet Take 1 tablet (80 mg total) by mouth every 12 (twelve) hours. 01/30/12  Yes [name] Blase, PA  oxycodone (ROXICODONE) 30 MG immediate release tablet Take 1 tablet (30 mg total) by mouth every 6 (six) hours as needed. 1 - 2 tabs every 6 hours prn sickle cell crisis pain 01/30/12  Yes  [name] Blase, PA  atovaquone-proguanil (MALARONE) 250-100 MG TABS Take 1 tablet by mouth daily. Take 1 daily starting the day before travel & continue for 1 week afterwards. 01/15/12   [name] Macho, MD   Physical Exam: Filed Vitals:   02/04/12 1045 02/04/12 1127 02/04/12 1349 02/04/12 1400  BP: 157/83 137/55 135/59 132/73  Pulse: 111 114 106 109  Temp:      TempSrc:      Resp:  20 25 23   SpO2: 93% 88% 93% 92%     [name]:  wdwn IN MODERATE PAIN.  Eyes: PERRLA. EOMI  ENT: Oropharynx clear, no lesions no exudates.  Neck: Supple with no lymphadenopathy.  Cardiovascular: Tachycardic regular rhythm no murmurs rubs or gallops  Respiratory: Clear to auscultation bilaterally. No wheezes, no crackles, no rhonchi.  Abdomen: Soft, nontender, nondistended. Positive bowel sounds.  Skin: No rashes or lesions noted  Musculoskeletal: 5 out of 5 bilateral upper extremity strength. However 5 bilateral lower extremity strength.  Psychiatric: Normal mood. Normal affect. Good insight. Good judgment.  Neurologic: Alert and oriented x3. Cranial nerves II through XII are grossly intact. No focal deficits.  Labs on Admission:  Basic Metabolic Panel:  Lab 02/04/12 2130  NA 136  K 4.6  CL 101  CO2 24  GLUCOSE 120*  BUN 17  CREATININE 1.55*  CALCIUM 9.3  MG --  PHOS --   Liver Function Tests:  Lab 02/04/12 1100  AST 123*  ALT 42  ALKPHOS 105  BILITOT 4.4*  PROT 7.3  ALBUMIN 4.5   No results found for this basename: LIPASE:5,AMYLASE:5 in the last 168 hours No results found for this basename: AMMONIA:5 in the last 168 hours CBC:  Lab 02/04/12 1100  WBC 15.6*  NEUTROABS 10.6*  HGB 9.8*  HCT 25.5*  MCV 81.2  PLT 271   Cardiac Enzymes:  Lab 02/04/12 1310  CKTOTAL --  CKMB --  CKMBINDEX --  TROPONINI <0.30    BNP (last 3 results) No results found for this basename: PROBNP:3 in the last 8760 hours CBG: No results found for this basename: GLUCAP:5 in the last 168  hours  Radiological Exams on Admission: Dg Lumbar Spine Complete  02/04/2012  *RADIOLOGY REPORT*  Clinical Data: Sickle cell pain crisis.  LUMBAR SPINE - COMPLETE 4+ VIEW  Comparison: 09/06/2010.  Findings: Alignment is anatomic.  Vertebral body and disc space height are maintained.  Diffuse concavity of the endplates is again noted.  Patchy sclerosis in the visualized osseous structures, as before.  IMPRESSION: Osseous changes of sickle cell without acute finding.   Original Report Authenticated By: Reyes Ivan, M.D.    Dg Hip Complete Right  02/04/2012  *RADIOLOGY REPORT*  Clinical Data: Sickle cell pain crisis.  RIGHT HIP - COMPLETE 2+ VIEW  Comparison: MR left hip 09/17/2011 and left hip radiographs 09/16/2011.  Findings: Patchy sclerosis in the femoral heads and pelvis, as before.  No evidence of acute fracture.  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   Original Report Authenticated By: Reyes Ivan,  M.D.    Dg Chest Port 1 View  02/04/2012  *RADIOLOGY REPORT*  Clinical Data: Pain.  Sickle cell.  PORTABLE CHEST - 1 VIEW  Comparison: 09/19/2011.  Findings: Trachea is midline.  Heart is mildly enlarged.  Lungs are somewhat low in volume with probable vascular crowding.  No pleural fluid.  IMPRESSION: Low lung volumes.  No acute findings.   Original Report Authenticated By: Reyes Ivan, M.D.     EKG: Not done  Assessment/Plan Principal Problem:  *Acute respiratory failure with hypercapnia Active Problems:  Sickle cell anemia with crisis  Leukocytosis  Anemia  Dehydration   #1 acute respiratory failure/acute respiratory acidosis Questionable etiology. Patient does have a hypercoagulable state with hemoglobin SS disease. Patient's oxygenation saturations were 77 point on room and on presentation to the ED. EKG was not done and as such will order EKG. First set of troponins was negative. Will order a  d-dimer which just came back with a level of 7.80. ABG showed patient to respiratory acidosis. Will check a CT angiogram of the chest that to rule out PE. A repeat chest x-ray in the morning to rule out a pneumonia. Will check a UA with cultures and sensitivities. Will check blood cultures x2. Placed empirically on IV Levaquin. Will consult with critical care medicine for further evaluation and management. IV fluids. Pain management. Supportive care.  #2 acute sickle cell crisis Questionable etiology. Patient states had a productive cough one week prior to admission and may have had a pulmonary infection which may have tipped him off into acute sickle cell crisis. Patient does have a leukocytosis. Will check blood cultures x2. Check a UA with cultures and sensitivities. Repeat chest x-ray in the morning after patient has been hydrated to rule out a pneumonia. Will place empirically on IV Levaquin and follow. We'll also place on IV fluids, oxygen, folic acid, continue home dose hydroxyurea, pain management, and therapeutic Lovenox per hematology recommendations. Patient will likely get a central line placed per critical care medicine or a PICC line. Will consult with hematology Dr. Rosemarie Beath, for further evaluation and management. Patient may benefit from exchange transfusion. Follow.  #3 leukocytosis Questionable etiology. Patient states had a productive cough one week prior to admission and was treated with a Z-Pak. Patient still with a productive cough. Will check a sputum Gram stain and culture. Will repeat chest x-ray in the morning to rule out a pneumonia the patient has been hydrated. Will check blood cultures x2. Will check a urine analysis with cultures and sensitivities. Will place empirically on IV Levaquin.  #4 dehydration IV fluids  #5 anemia Secondary to problem #2. See problem #2.  #6 prophylaxis PPI for GI prophylaxis. Lovenox for DVT prophylaxis.  Code Status: Full Family  Communication: updated patient at bedside Disposition Plan: Admit to ICU  Time spent: 90 mins  [facility] Pager [phone]  If 7PM-7AM, please contact night-coverage [email or web address] Password [email or web address] 02/04/2012, 2:10 PM

## 2012-02-04 NOTE — Consult Note (Signed)
#  [id number] is consult note.  [name]

## 2012-02-04 NOTE — ED Notes (Signed)
Pt transport to CT will escort pt up to 2nd floor as soon as he return

## 2012-02-04 NOTE — ED Notes (Signed)
Patient c/o sickle cell pain of mid back and right hip. Patient reports that the pain began 2 days ago. Patient last took Oxycontin at 0700 with no relief. NSR. Sats initially 77% on room air. Patient placed on O2 4L/min via .

## 2012-02-04 NOTE — ED Provider Notes (Signed)
History     CSN: [id number]  Arrival date & time 02/04/12  4098   First MD Initiated Contact with Patient 02/04/12 1037      Chief Complaint  Patient presents with  . Sickle Cell Pain Crisis    (Consider location/radiation/quality/duration/timing/severity/associated sxs/prior treatment) Patient is a 31 y.o. male presenting with sickle cell pain. The history is provided by the patient. No language interpreter was used.  Sickle Cell Pain Crisis  This is a new problem. The current episode started 2 days ago. The problem occurs occasionally. The problem has been gradually worsening. Pain location: Lower back and right groin. Site of pain is localized in a joint. The pain is similar to prior episodes. The pain is severe. Nothing relieves the symptoms. The symptoms are aggravated by movement. Associated symptoms include nausea, vomiting, back pain and joint pain. Pertinent negatives include no chest pain, no blurred vision, no double vision, no photophobia, no abdominal pain, no dysuria, no hematuria, no congestion, no headaches, no rhinorrhea, no sore throat, no swollen glands, no neck pain, no neck stiffness, no cough and no difficulty breathing. He sickle cell type is SS. There is a history of acute chest syndrome. There have been no frequent pain crises. 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 There is no history of stroke. He has not been treated with chronic transfusion therapy. There were no sick contacts. Recently, medical care has been given by a specialist.   31 year old male coming with a sickle cell crisis with lower back pain and right groin pain x2 days. He's taking OxyContin at home with no relief. He has been in the ICU intubated on the ventilator in the past. His last crisis was in June he had an acute vaso-occlusive crisis with transfusion. Patient states he is allergic to Dilaudid but was given Dilaudid his last admission. States that for now helps his pain. States she's also  allergic to Phenergan usually gets Benadryl with his medications. States that he vomited x1. O2 sats on arrival were 77 on room air. Temp is 99. Patient sees Dr. Myna Hidalgo and Dr. Ferd Hibbs. Denies SOB or chest pain.   Past Medical History  Diagnosis Date  . Sickle cell anemia     Past Surgical History  Procedure Date  . Cholecystectomy     Family History  Problem Relation Age of Onset  . Sickle cell trait      History  Substance Use Topics  . Smoking status: Never Smoker   . Smokeless tobacco: Never Used  . Alcohol Use: Yes     occassionally      Review of Systems  Constitutional: Negative.   HENT: Negative.  Negative for congestion, sore throat, rhinorrhea and neck pain.   Eyes: Negative.  Negative for blurred vision, double vision and photophobia.  Respiratory: Negative.  Negative for cough.   Cardiovascular: Negative.  Negative for chest pain.  Gastrointestinal: Positive for nausea and vomiting. Negative for abdominal pain.  Genitourinary: Negative for dysuria and hematuria.  Musculoskeletal: Positive for back pain and joint pain.  Neurological: Negative.  Negative for headaches.  Psychiatric/Behavioral: Negative.   All other systems reviewed and are negative.    Allergies  Morphine and related; Dilaudid; and Promethazine hcl  Home Medications   Current Outpatient Rx  Name Route Sig Dispense Refill  . ALPRAZOLAM 1 MG PO TABS  1 mg 3 (three) times daily as needed.     . ATOVAQUONE-PROGUANIL HCL 250-100 MG PO TABS Oral Take 1 tablet by  mouth daily. Take 1 daily starting the day before travel & continue for 1 week afterwards. 50 tablet 0  . VITAMIN B 12 PO Oral Take 1 tablet by mouth.    . FOLIC ACID 1 MG PO TABS Oral Take 1 mg by mouth daily.      Marland Kitchen HYDROXYUREA 500 MG PO CAPS Oral Take 2 capsules (1,000 mg total) by mouth daily. May take with food to minimize GI side effects. 60 capsule 2  . ADULT MULTIVITAMIN W/MINERALS CH Oral Take 1 tablet by mouth daily.    .  OXYCODONE HCL ER 80 MG PO TB12 Oral Take 1 tablet (80 mg total) by mouth every 12 (twelve) hours. 60 tablet 0  . OXYCODONE HCL 30 MG PO TABS Oral Take 1 tablet (30 mg total) by mouth every 6 (six) hours as needed. 1 - 2 tabs every 6 hours prn sickle cell crisis pain 120 tablet 0    BP 157/83  Pulse 111  Temp 99.9 F (37.7 C) (Oral)  SpO2 93%  Physical Exam  Nursing note and vitals reviewed. Constitutional: He is oriented to person, place, and time. He appears well-developed and well-nourished.  HENT:  Head: Normocephalic.  Eyes: Conjunctivae normal and EOM are normal. Pupils are equal, round, and reactive to light.  Neck: Normal range of motion. Neck supple.  Cardiovascular: Normal rate.   Pulmonary/Chest: Effort normal.       Hypoxic on ra   Abdominal: Soft. He exhibits no distension. There is no tenderness.  Musculoskeletal: Normal range of motion. He exhibits tenderness.       Lumbar tenderness/r hip tenderness  Neurological: He is alert and oriented to person, place, and time.  Skin: Skin is warm and dry.  Psychiatric: He has a normal mood and affect.    ED Course  Procedures (including critical care time)   Labs Reviewed  CBC WITH DIFFERENTIAL  COMPREHENSIVE METABOLIC PANEL  RETICULOCYTES   No results found.   No diagnosis found.    MDM  31 year old male with typical sickle cell crisis including hypoxia, temp 99.9, tachycardia, lower back pain and right hip pain. Initially patient stated he was allergic to Dilaudid but record showed he can take it as long as it does not exceed 4 mg a time. He received fentanyl x2 in the ER with some relief. Patient will be admitted to the hospital as an Dr. Janee Morn. Chest x-ray unremarkable reviewed by myself. This is a sheered visit with Dr. Denton Lank.    Labs Reviewed  CBC WITH DIFFERENTIAL - Abnormal; Notable for the following:    WBC 15.6 (*)     RBC 3.14 (*)     Hemoglobin 9.8 (*)     HCT 25.5 (*)     MCHC 38.4 (*)   SICKLE CELLS   RDW 24.4 (*)     Monocytes Relative 16 (*)     nRBC 34 (*)     Neutro Abs 10.6 (*)     Monocytes Absolute 2.5 (*)     All other components within normal limits  COMPREHENSIVE METABOLIC PANEL - Abnormal; Notable for the following:    Glucose, Bld 120 (*)     Creatinine, Ser 1.55 (*)     AST 123 (*)     Total Bilirubin 4.4 (*)     GFR calc non Af Amer 58 (*)     GFR calc Af Amer 67 (*)     All other components within normal limits  RETICULOCYTES -  Abnormal; Notable for the following:    Retic Ct Pct 24.8 (*)  RESULTS CONFIRMED BY MANUAL DILUTION   RBC. 3.14 (*)     Retic Count, Manual 778.7 (*)     All other components within normal limits  BLOOD GAS, ARTERIAL - Abnormal; Notable for the following:    pH, Arterial 7.289 (*)     pCO2 arterial 51.0 (*)     pO2, Arterial 55.8 (*)     Acid-base deficit 2.4 (*)     All other components within normal limits  URINALYSIS, ROUTINE W REFLEX MICROSCOPIC  URINALYSIS, ROUTINE W REFLEX MICROSCOPIC  TROPONIN I  CULTURE, BLOOD (ROUTINE X 2)  CULTURE, BLOOD (ROUTINE X 2)  D-DIMER, QUANTITATIVE          Remi Haggard, NP 02/04/12 1336

## 2012-02-04 NOTE — Consult Note (Signed)
Name: [name] MRN: [id number] DOB: [birth year]    LOS: 0    History of Present Illness:  31 yobm with type SS Sickle cell, presented to [facility] on 10/28 w/ 2 d h/o progressive right groin and lower back pain typical of his sickle crises. Per pt had seen his PCP on 10/25 w/ CC: cough w/ purulent sputum, for which he was treated w/ azithro and cough suppressant. He reports that the cough improved some, however he began to notice low back, and right groin pain which has worsened in intensity to the point he sought out evaluation in the ER on 10/28. On arrival his room air pulse ox was noted to be 77%, w/ ABG demonstrating both hypercarbic and hypoxic resp failure. His CXR was low volume, but did not demonstrate significant airspace disease. He denies any chest pain, shortness of breath (even with activity), wheeze or sick exposure. His primary complaint is severe right groin pain which currently is still not controlled even after narcotics administration in the ER. He is to be admitted to the ICU for treatment of Sickle cell crisis and further evaluation of his hypoxia.   Lines / Drains:  Cultures: BCX2 10/28>>> UC 10/28>>> U strep 10/28>>> U legionella 10/28>>>  Antibiotics: levaquin 10/28>>>  Tests / Events:     Past Medical History  Diagnosis Date  . Sickle cell anemia    Past Surgical History  Procedure Date  . Cholecystectomy    Prior to Admission medications   Medication Sig Start Date End Date Taking? Authorizing Provider  ALPRAZolam (XANAX) 1 MG tablet 1 mg 3 (three) times daily as needed.  10/31/11  Yes Josph Macho, MD  Cyanocobalamin (VITAMIN B 12 PO) Take 1 tablet by mouth.   Yes Historical Provider, MD  folic acid (FOLVITE) 1 MG tablet Take 1 mg by mouth daily.     Yes Historical Provider, MD  hydroxyurea (HYDREA) 500 MG capsule Take 2 capsules (1,000 mg total) by mouth daily. May take with food to minimize GI side effects. 11/29/11  Yes Josph Macho,  MD  Multiple Vitamin (MULITIVITAMIN WITH MINERALS) TABS Take 1 tablet by mouth daily.   Yes Historical Provider, MD  oxyCODONE (OXYCONTIN) 80 MG 12 hr tablet Take 1 tablet (80 mg total) by mouth every 12 (twelve) hours. 01/30/12  Yes Eunice Blase, PA  oxycodone (ROXICODONE) 30 MG immediate release tablet Take 1 tablet (30 mg total) by mouth every 6 (six) hours as needed. 1 - 2 tabs every 6 hours prn sickle cell crisis pain 01/30/12  Yes Eunice Blase, PA  atovaquone-proguanil (MALARONE) 250-100 MG TABS Take 1 tablet by mouth daily. Take 1 daily starting the day before travel & continue for 1 week afterwards. 01/15/12   Josph Macho, MD   Allergies Allergies  Allergen Reactions  . Morphine And Related Rash  . Dilaudid (Hydromorphone Hcl)     dilusional only with 4mg  dose. Can't take "too much"  . Promethazine Hcl Hives    Family History Family History  Problem Relation Age of Onset  . Sickle cell trait      Social History  reports that he has never smoked. He has never used smokeless tobacco. He reports that he drinks alcohol. He reports that he does not use illicit drugs.  Review Of Systems   Review of Systems  Constitutional: No weight loss, gain, night sweats, Fevers, chills, fatigue .  HEENT: No headaches, visual changes, Difficulty swallowing, Tooth/dental  problems, or Sore throat,  No sneezing, itching, ear ache, + nasal congestion, -post nasal drip, no visual complaints CV: No chest pain, Orthopnea, PND, swelling in lower extremities, dizziness, palpitations, syncope. No recent travel  GI No heartburn, indigestion, abdominal pain, nausea, vomiting, diarrhea, change in bowel habits, loss of appetite, bloody stools.  Resp: + cough (improved some), No coughing up of blood. Mucus clearing . No wheezing.  Skin: no rash or itching or icterus GU: no dysuria, change in color of urine, no urgency or frequency. No flank pain, no hematuria  MS: right groin pain, decreased range of motion    Psych: No change in mood or affect. No depression or anxiety.  Neuro: no difficulty with speech, weakness, numbness, ataxia  Vital Signs: BP 132/73  Pulse 109  Temp 99.9 F (37.7 C) (Oral)  Resp 23  SpO2 92% 6 liters       . sodium chloride    . sodium chloride    . sodium chloride Stopped (02/04/12 1221)  . sodium chloride 1,000 mL (02/04/12 1328)  . sodium chloride      No intake or output data in the 24 hours ending 02/04/12 1406  Physical Examination: General:  Well developed 31 year old male, currently in acute discomfort involving the right groin  Neuro:  Awake, oriented X 2 HEENT:  [place]. No JVD  Cardiovascular:  rrr Lungs:  Clear  Abdomen:  Soft, non-tender  Musculoskeletal:  Right groin pain. Right LE CMS intact  Skin:  intact  Ventilator settings: Vent Mode:  [-]  FiO2 (%):  [4 %] 4 %  Labs and Imaging:   Lab 02/04/12 1100  NA 136  K 4.6  CL 101  CO2 24  BUN 17  CREATININE 1.55*  GLUCOSE 120*    Lab 02/04/12 1100  HGB 9.8*  HCT 25.5*  WBC 15.6*  PLT 271   ABG    Component Value Date/Time   PHART 7.289* 02/04/2012 1320   PCO2ART 51.0* 02/04/2012 1320   PO2ART 55.8* 02/04/2012 1320   HCO3 23.7 02/04/2012 1320   TCO2 23.1 02/04/2012 1320   ACIDBASEDEF 2.4* 02/04/2012 1320   O2SAT 71.9 02/04/2012 1320    PCXR: low volume no clear infiltrate.   Assessment and Plan: Acute Hypoxic and Hypercarbic respiratory failure. Unclear  etiology at this point. Certainly in light of essentially clear CXR concerned about PE as potential cause. CXR not currently c/w PNA or even Acute chest syndrome. Wonder how much of his hypercarbia is due to narcotics as he had received several doses of fentanyl and dilaudid in ER just prior to this lab draw. Not sure how much of this is actually accurate data as he does not appear hypoxic or in any distress from a pulm stand-point.  Plan: Supplemental oxygen Treat Sickle cell crisis Cycle CEs Send BNP Systemic  anticoagulation per heme F/u CXR Will hold off on VQ scan or other imaging for now  Sickle Cell crisis w/ H/o sickle cell anemia (requiring prior transfusions): seemingly triggered by recent URI. He see's Ennever from Hematology who has recommended anticoagulation and will see in consult.  Plan: IV hydration Supplemental oxygen  Analgesia Place PICC Additional recs per heme Repeat chemistry and CBC in ER may need to transfuse  SIRS (in setting of leukocytosis and tachycardia). Has no clear defining etiology for sepsis at this point but on diff dx. May be after hydration pulm infiltrates will be more prominent.  Plan Send PCT BCX2 Empirical levaquin would  cover CAP (although no infiltrates), URI and Lower below the belt infections as well.   Dehydration/ acute renal failure:  Plan: Aggressive IV hydration Close obs of chemistry strick I&O.   Hyperglycemia Plan: Per # 1 service   Best practices / Disposition: -->ICU status under triad -->full code -->Heparin for DVT Px -->Protonix for GI Px     BABCOCK,PETE 02/04/2012, 2:06 PM  The patient is critically ill with multiple organ systems failure and requires high complexity decision making for assessment and support, frequent evaluation and titration of therapies, application of advanced monitoring technologies and extensive interpretation of multiple databases. Critical Care Time devoted to patient care services described in this note is 45 minutes.   Sandrea Hughs, MD Pulmonary and Critical Care Medicine [facility] Cell [phone]

## 2012-02-04 NOTE — ED Notes (Signed)
CT tech states that he could not get a good study, states that it needs to be repeated in 4 hours and hydrated.

## 2012-02-05 ENCOUNTER — Inpatient Hospital Stay (HOSPITAL_COMMUNITY): Payer: Medicare Other

## 2012-02-05 DIAGNOSIS — L299 Pruritus, unspecified: Secondary | ICD-10-CM

## 2012-02-05 DIAGNOSIS — M79609 Pain in unspecified limb: Secondary | ICD-10-CM

## 2012-02-05 DIAGNOSIS — D5701 Hb-SS disease with acute chest syndrome: Secondary | ICD-10-CM

## 2012-02-05 DIAGNOSIS — D5700 Hb-SS disease with crisis, unspecified: Principal | ICD-10-CM

## 2012-02-05 DIAGNOSIS — R52 Pain, unspecified: Secondary | ICD-10-CM

## 2012-02-05 DIAGNOSIS — E872 Acidosis: Secondary | ICD-10-CM

## 2012-02-05 DIAGNOSIS — J9600 Acute respiratory failure, unspecified whether with hypoxia or hypercapnia: Secondary | ICD-10-CM

## 2012-02-05 LAB — COMPREHENSIVE METABOLIC PANEL
ALT: 36 U/L (ref 0–53)
Albumin: 3.6 g/dL (ref 3.5–5.2)
Alkaline Phosphatase: 86 U/L (ref 39–117)
BUN: 14 mg/dL (ref 6–23)
Calcium: 8.4 mg/dL (ref 8.4–10.5)
GFR calc Af Amer: >90 mL/min (ref >90)
Glucose, Bld: 122 mg/dL — ABNORMAL HIGH (ref 70–99)
Potassium: 4.6 mEq/L (ref 3.5–5.1)
Sodium: 138 mEq/L (ref 135–145)
Total Protein: 6.4 g/dL (ref 6.0–8.3)

## 2012-02-05 LAB — URINE CULTURE: Culture: NO GROWTH

## 2012-02-05 LAB — RETICULOCYTES
RBC.: 2.88 MIL/uL — ABNORMAL LOW (ref 4.22–5.81)
Retic Count, Absolute: 748.8 10*3/uL — ABNORMAL HIGH (ref 19.0–186.0)
Retic Ct Pct: 26 % — ABNORMAL HIGH (ref 0.4–3.1)

## 2012-02-05 LAB — LEGIONELLA ANTIGEN, URINE: Legionella Antigen, Urine: NEGATIVE

## 2012-02-05 LAB — CBC WITH DIFFERENTIAL/PLATELET
Basophils Absolute: 0.2 10*3/uL — ABNORMAL HIGH (ref 0.0–0.1)
Eosinophils Absolute: 0 10*3/uL (ref 0.0–0.7)
Lymphocytes Relative: 19 % (ref 12–46)
MCHC: 38.3 g/dL — ABNORMAL HIGH (ref 30.0–36.0)
Monocytes Relative: 7 % (ref 3–12)
Platelets: 226 10*3/uL (ref 150–400)
RDW: 25.4 % — ABNORMAL HIGH (ref 11.5–15.5)
WBC: 15.1 10*3/uL — ABNORMAL HIGH (ref 4.0–10.5)

## 2012-02-05 LAB — PREPARE RBC (CROSSMATCH)

## 2012-02-05 LAB — PROCALCITONIN: Procalcitonin: 0.31 ng/mL

## 2012-02-05 LAB — MAGNESIUM: Magnesium: 2.2 mg/dL (ref 1.5–2.5)

## 2012-02-05 MED ORDER — CHLORHEXIDINE GLUCONATE CLOTH 2 % EX PADS
6.0000 | MEDICATED_PAD | Freq: Every day | CUTANEOUS | Status: AC
  Administered 2012-02-05 – 2012-02-07 (×2): 6 via TOPICAL

## 2012-02-05 MED ORDER — FENTANYL CITRATE 0.05 MG/ML IJ SOLN
INTRAMUSCULAR | Status: AC
  Administered 2012-02-05: 25 ug via INTRAVENOUS
  Filled 2012-02-05: qty 2

## 2012-02-05 MED ORDER — BIOTENE DRY MOUTH MT LIQD
15.0000 mL | Freq: Two times a day (BID) | OROMUCOSAL | Status: DC
  Administered 2012-02-05 – 2012-02-15 (×19): 15 mL via OROMUCOSAL

## 2012-02-05 MED ORDER — HYDROMORPHONE HCL PF 1 MG/ML IJ SOLN: 1.0000 mg | INTRAMUSCULAR | Status: DC | PRN

## 2012-02-05 MED ORDER — APREPITANT 80 & 125 MG PO MISC
125.0000 mg | Freq: Once | ORAL | Status: AC
  Administered 2012-02-05: 125 mg via ORAL
  Filled 2012-02-05: qty 3

## 2012-02-05 MED ORDER — SODIUM CHLORIDE 0.9 % IJ SOLN
10.0000 mL | INTRAMUSCULAR | Status: DC | PRN
  Administered 2012-02-07: 140 mL

## 2012-02-05 MED ORDER — DIPHENHYDRAMINE HCL 50 MG/ML IJ SOLN
25.0000 mg | Freq: Four times a day (QID) | INTRAMUSCULAR | Status: DC | PRN
  Administered 2012-02-05 – 2012-02-15 (×33): 25 mg via INTRAVENOUS
  Filled 2012-02-05 (×33): qty 1

## 2012-02-05 MED ORDER — APREPITANT 80 & 125 MG PO TRIPAK DAY 2 & 3
80.0000 mg | Freq: Every day | ORAL | Status: AC
  Administered 2012-02-06 – 2012-02-07 (×2): 80 mg via ORAL
  Filled 2012-02-05: qty 2

## 2012-02-05 MED ORDER — SODIUM CHLORIDE 0.9 % IJ SOLN
10.0000 mL | Freq: Two times a day (BID) | INTRAMUSCULAR | Status: DC
  Administered 2012-02-05: 10 mL
  Administered 2012-02-05: 20 mL
  Administered 2012-02-06 – 2012-02-08 (×5): 10 mL
  Administered 2012-02-08: 20 mL
  Administered 2012-02-09 – 2012-02-15 (×13): 10 mL

## 2012-02-05 MED ORDER — FOLIC ACID 1 MG PO TABS
2.0000 mg | ORAL_TABLET | Freq: Every day | ORAL | Status: DC
  Administered 2012-02-05 – 2012-02-15 (×11): 2 mg via ORAL
  Filled 2012-02-05 (×11): qty 2

## 2012-02-05 MED ORDER — IOHEXOL 350 MG/ML SOLN
100.0000 mL | Freq: Once | INTRAVENOUS | Status: AC | PRN
  Administered 2012-02-05: 100 mL via INTRAVENOUS

## 2012-02-05 MED ORDER — HYDROMORPHONE HCL PF 2 MG/ML IJ SOLN
2.0000 mg | INTRAMUSCULAR | Status: DC | PRN
  Administered 2012-02-05 – 2012-02-15 (×101): 2 mg via INTRAVENOUS
  Filled 2012-02-05 (×102): qty 1

## 2012-02-05 MED ORDER — HYDROMORPHONE HCL PF 2 MG/ML IJ SOLN
2.0000 mg | INTRAMUSCULAR | Status: DC | PRN
  Administered 2012-02-05 (×2): 4 mg via INTRAVENOUS
  Administered 2012-02-05: 2 mg via INTRAVENOUS
  Administered 2012-02-05 (×2): 4 mg via INTRAVENOUS
  Administered 2012-02-05: 2 mg via INTRAVENOUS
  Administered 2012-02-05: 4 mg via INTRAVENOUS
  Filled 2012-02-05: qty 2
  Filled 2012-02-05: qty 1
  Filled 2012-02-05: qty 2
  Filled 2012-02-05: qty 1
  Filled 2012-02-05 (×3): qty 2

## 2012-02-05 MED ORDER — FENTANYL CITRATE 0.05 MG/ML IJ SOLN
25.0000 ug | INTRAMUSCULAR | Status: DC | PRN
  Administered 2012-02-05: 25 ug via INTRAVENOUS

## 2012-02-05 NOTE — Progress Notes (Addendum)
Mr. Khim did okay last night. His CT scan was negative for any pulmonary embolism. I would still continue him on therapeutic Lovenox for the his sickle crisis. He is still on IV pain medication every 2 hours or so. His pain mostly is in his thighs.  He still has not have the PICC line in. Hopefully this will be done this morning. His hemoglobin is 9. We can begin his exchange transfusion program today.  He does have some pruritus. I gave him a dose of Emend last night. This does seem to help him. I'll put him on some oral dosing today.  I am still holding his oral pain medications. He has problems when his oral medications get mixed with IV medications and runs into respiratory difficulties. Once he is out of the ICU in more stable with his sickle crisis, that we can you back onto oral medications.  If his IV fluids. He continues on supplemental oxygen. I need to make sure that he has his incentive spirometer to help with acute chest syndrome prevention.  His vital signs are stable. Heart rate is 104. Blood pressure 117/61. Oxygen saturation 94%. His lungs showed decent breath sounds bilaterally. He has no wheezing. His cardiac exam is regular rate and rhythm with a normal S1 and S2. There are no murmurs rubs or bruits. Abdominal exam soft. He has good bowel sounds. Extremities shows some tenderness in the upper thighs, particularly on the right side. Neurological exam shows no focal neurological deficits.  Again, his exchange transfusion program will start today. He needs to get his PICC line in.   [name]  Isaiah 26:4  ADDENDUM:   He is "reticking" like crazy!!!  This will be a very good indicator as to his response to exchanges and to severity of his crisis.  Keep the folic acid coming!!!  Cindee Lame

## 2012-02-05 NOTE — Progress Notes (Signed)
[facility] PROGRESS NOTE  [name] EAV:[id number] DOB: 22-Aug-1980 DOA: 02/04/2012 PCP: Billee Cashing, MD  Assessment/Plan:  #1 acute respiratory acidosis Likely secondary to pain medications. Patient however does not look in respiratory distress. Patient on oxygen. CT angiogram of the chest was negative for PE. Chest x-ray with some appearance of a vascular congestion. We'll need to monitor closely for development of ARDS. Patient is to receive exchange transfusions today. Monitor. Pulmonary and critical care medicine is following and appreciate input and recommendations.  #2 acute sickle cell crisis Likely triggered by respiratory infection. Patient still with significant pain. Patient is itching all over. CT angiogram of the chest was negative for pulmonary embolus. Per hematology to continue on therapeutic Lovenox for a sickle cell crisis. Continue IV pain medication. Continue hydroxyzine as needed for itching. Will add Benadryl as needed for itching. Patient has been started on the Emend per hematology for itching. Continue hydration and folic acid. Continue empiric IV Levaquin. Patient for exchange transfusion today after PICC line is placed. Hematology is following and appreciate input and recommendations.  #3 leukocytosis Questionable etiology. May be secondary to acute sickle cell crisis. Patient was recently treated with a Z-Pak. Sputum Gram stain and cultures pending. Chest x-ray is negative for acute infiltrate. Blood cultures are pending. Urinalysis is negative. Continue empiric IV Levaquin. Follow.  #4 dehydration IV fluids  #5 anemia Secondary to sickle cell crisis. Patient to undergo exchange transfusion today. Patient hematology.  #6 prophylaxis  PPI for GI prophylaxis. Lovenox for DVT prophylaxis.   Code Status: Full Family Communication: Updated patient at bedside Disposition Plan: Home when medically stable   Consultants:  Critical care medicine:  Dr. Sherene Sires 02/04/2012  Hematology oncology: Dr. Myna Hidalgo 02/04/2012  Procedures:  CXR 02/04/12, 02/05/12  Plain films of the right hip 02/04/2012  Plain films of the L-spine 02/04/2012  CT of the chest 02/04/2012  PICC line 02/05/2012  Antibiotics:  IV Levaquin 02/04/12  HPI/Subjective: Patient states no significant improvement with pain. Still itching all over and a lot.  Objective: Filed Vitals:   02/05/12 0435 02/05/12 0500 02/05/12 0530 02/05/12 0800  BP: 104/62 117/61    Pulse: 102 104 104   Temp:    98.2 F (36.8 C)  TempSrc:    Oral  Resp: 19 17 24    Height:      Weight:  74.8 kg (164 lb 14.5 oz)    SpO2: 94% 94% 93%     Intake/Output Summary (Last 24 hours) at 02/05/12 0932 Last data filed at 02/05/12 0800  Gross per 24 hour  Intake   2265 ml  Output   1550 ml  Net    715 ml   Filed Weights   02/04/12 1625 02/05/12 0400 02/05/12 0500  Weight: 73.2 kg (161 lb 6 oz) 74.8 kg (164 lb 14.5 oz) 74.8 kg (164 lb 14.5 oz)    Exam:   General:  Itching. In pain  Cardiovascular: RRR  Respiratory: CTAB  Abdomen: SOFT/nt/nd/+bs  Data Reviewed: Basic Metabolic Panel:  Lab 02/05/12 7829 02/04/12 1640 02/04/12 1528 02/04/12 1100  NA 138 -- 138 136  K 4.6 -- 4.3 4.6  CL 106 -- 106 101  CO2 23 -- 23 24  GLUCOSE 122* -- 97 120*  BUN 14 -- 12 17  CREATININE 1.13 -- 1.15 1.55*  CALCIUM 8.4 -- 8.1* 9.3  MG 2.2 1.7 -- --  PHOS -- -- -- --   Liver Function Tests:  Lab 02/05/12 0330 02/04/12 1100  AST 102* 123*  ALT 36 42  ALKPHOS 86 105  BILITOT 2.8* 4.4*  PROT 6.4 7.3  ALBUMIN 3.6 4.5   No results found for this basename: LIPASE:5,AMYLASE:5 in the last 168 hours  Lab 02/04/12 1640  AMMONIA 65*   CBC:  Lab 02/05/12 0330 02/04/12 1528 02/04/12 1100  WBC 15.1* 16.3* 15.6*  NEUTROABS 10.9* -- 10.6*  HGB 9.0* 8.6* 9.8*  HCT 23.5* 22.5* 25.5*  MCV 81.6 81.8 81.2  PLT 226 229 271   Cardiac Enzymes:  Lab 02/05/12 0201 02/04/12 2008 02/04/12  1310  CKTOTAL -- -- --  CKMB -- -- --  CKMBINDEX -- -- --  TROPONINI <0.30 <0.30 <0.30   BNP (last 3 results)  Basename 02/04/12 1641  PROBNP 158.2*   CBG: No results found for this basename: GLUCAP:5 in the last 168 hours  Recent Results (from the past 240 hour(s))  CULTURE, BLOOD (ROUTINE X 2)     Status: Normal (Preliminary result)   Collection Time   02/04/12  1:27 PM      Component Value Range Status Comment   Specimen Description BLOOD RIGHT ARM   Final    Special Requests BOTTLES DRAWN AEROBIC AND ANAEROBIC 10CC   Final    Culture  Setup Time 02/04/2012 20:52   Final    Culture     Final    Value:        BLOOD CULTURE RECEIVED NO GROWTH TO DATE CULTURE WILL BE HELD FOR 5 DAYS BEFORE ISSUING A FINAL NEGATIVE REPORT   Report Status PENDING   Incomplete   CULTURE, BLOOD (ROUTINE X 2)     Status: Normal (Preliminary result)   Collection Time   02/04/12  1:29 PM      Component Value Range Status Comment   Specimen Description BLOOD RIGHT HAND   Final    Special Requests BOTTLES DRAWN AEROBIC AND ANAEROBIC 10CC   Final    Culture  Setup Time 02/04/2012 20:52   Final    Culture     Final    Value:        BLOOD CULTURE RECEIVED NO GROWTH TO DATE CULTURE WILL BE HELD FOR 5 DAYS BEFORE ISSUING A FINAL NEGATIVE REPORT   Report Status PENDING   Incomplete   MRSA PCR SCREENING     Status: Abnormal   Collection Time   02/04/12  4:19 PM      Component Value Range Status Comment   MRSA by PCR POSITIVE (*) NEGATIVE Final   CULTURE, EXPECTORATED SPUTUM-ASSESSMENT     Status: Normal   Collection Time   02/04/12  7:00 PM      Component Value Range Status Comment   Specimen Description SPUTUM   Final    Special Requests NONE   Final    Sputum evaluation     Final    Value: THIS SPECIMEN IS ACCEPTABLE. RESPIRATORY CULTURE REPORT TO FOLLOW.   Report Status 02/04/2012 FINAL   Final   CULTURE, RESPIRATORY     Status: Normal (Preliminary result)   Collection Time   02/04/12  7:00 PM        Component Value Range Status Comment   Specimen Description SPUTUM   Final    Special Requests NONE   Final    Gram Stain     Final    Value: NO WBC SEEN     FEW SQUAMOUS EPITHELIAL CELLS PRESENT     MODERATE GRAM POSITIVE COCCI IN CLUSTERS  IN PAIRS MODERATE GRAM NEGATIVE RODS     FEW GRAM POSITIVE RODS   Culture PENDING   Incomplete    Report Status PENDING   Incomplete      Studies: Dg Lumbar Spine Complete  02/04/2012  *RADIOLOGY REPORT*  Clinical Data: Sickle cell pain crisis.  LUMBAR SPINE - COMPLETE 4+ VIEW  Comparison: 09/06/2010.  Findings: Alignment is anatomic.  Vertebral body and disc space height are maintained.  Diffuse concavity of the endplates is again noted.  Patchy sclerosis in the visualized osseous structures, as before.  IMPRESSION: Osseous changes of sickle cell without acute finding.   Original Report Authenticated By: Reyes Ivan, M.D.    Dg Hip Complete Right  02/04/2012  *RADIOLOGY REPORT*  Clinical Data: Sickle cell pain crisis.  RIGHT HIP - COMPLETE 2+ VIEW  Comparison: MR left hip 09/17/2011 and left hip radiographs 09/16/2011.  Findings: Patchy sclerosis in the femoral heads and pelvis, as before.  No evidence of acute fracture.  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   Original Report Authenticated By: Reyes Ivan, M.D.    Ct Angio Chest Pe W/cm &/or Wo Cm  02/05/2012  *RADIOLOGY REPORT*  Clinical Data: Hypoxia.  Sickle cell crisis.  Elevated white count.  The patient was initially scanned at to 03:35 p.m. 10/28/ 2013. The contrast bolus was missed in the study is nondiagnostic.  The patient was brought back at 12:26 a.m. and reimaged.  CT ANGIOGRAPHY CHEST  Technique:  Multidetector CT imaging of the chest using the standard protocol during bolus administration of intravenous contrast. Multiplanar reconstructed images including MIPs were obtained and reviewed to  evaluate the vascular anatomy.  Contrast: OMNIPAQUE IOHEXOL 350 MG/ML SOLN  Comparison: None.  Findings: Pulmonary arterial opacification is satisfactory.  No focal filling defects are evident to suggest pulmonary emboli.  The heart is mildly enlarged.  No significant pleural or pericardial effusions are present.  The spleen is calcified and shrunken.  The upper abdomen is otherwise unremarkable.  Mild dependent atelectasis is present.  Diffuse ground-glass attenuation is evident, likely reflecting mild edema.  No focal airspace disease is present.  Bone windows demonstrate sclerotic changes and end plate concavities typical of sickle cell anemia.  No acute abnormalities are present.  IMPRESSION:  1.  No evidence for pulmonary embolus. 2.  Cardiomegaly with mild edema. 3.  Sclerotic bone changes compatible with sickle cell anemia. 4.  Chronic calcified changes of the spleen.   Original Report Authenticated By: Jamesetta Orleans. MATTERN, M.D.    Dg Chest Port 1 View  02/05/2012  *RADIOLOGY REPORT*  Clinical Data: Sickle cell disease.  Acute respiratory failure.  PORTABLE CHEST - 1 VIEW  Comparison: CT and plain film chest 02/04/2012.  Findings: There is cardiomegaly with some vascular congestion.  No consolidative process, frank edema, pneumothorax or pleural fluid is identified.  Multi focal bone infarcts are noted.  IMPRESSION: Cardiomegaly and pulmonary vascular congestion without frank edema or focal process.   Original Report Authenticated By: Bernadene Bell. Maricela Curet, M.D.    Dg Chest Port 1 View  02/04/2012  *RADIOLOGY REPORT*  Clinical Data: Pain.  Sickle cell.  PORTABLE CHEST - 1 VIEW  Comparison: 09/19/2011.  Findings: Trachea is midline.  Heart is mildly enlarged.  Lungs are somewhat low in volume with probable vascular crowding.  No pleural fluid.  IMPRESSION: Low lung volumes.  No acute findings.   Original Report Authenticated By: Juliette Alcide  A. BLIETZ, M.D.     Scheduled Meds:   . sodium  chloride   Intravenous STAT  . antiseptic oral rinse  15 mL Mouth Rinse BID  . aprepitant  125 mg Oral Once   Followed by  . aprepitant  80 mg Oral Daily  . Chlorhexidine Gluconate Cloth  6 each Topical Q0600  . diphenhydrAMINE  25 mg Intravenous Once  . docusate sodium  100 mg Oral BID  . enoxaparin (LOVENOX) injection  70 mg Subcutaneous STAT  . enoxaparin (LOVENOX) injection  70 mg Subcutaneous Q12H  . fentaNYL  100 mcg Intravenous Once  . fentaNYL  50 mcg Intravenous Once  . folic acid  2 mg Oral Daily  . fosaprepitant (EMEND) IV infusion 150 mg  150 mg Intravenous Once  . hydroxyurea  1,000 mg Oral Daily  . levofloxacin (LEVAQUIN) IV  750 mg Intravenous Q24H  . magnesium sulfate 1 - 4 g bolus IVPB  2 g Intravenous Once  . multivitamin with minerals  1 tablet Oral Daily  . mupirocin ointment  1 application Nasal BID  . ondansetron  4 mg Intravenous Once  . pantoprazole  40 mg Oral Daily   Or  . pantoprazole (PROTONIX) IV  40 mg Intravenous Daily  . sodium chloride  1,000 mL Intravenous Once  . sodium chloride  1,000 mL Intravenous Once  . sodium chloride  1,000 mL Intravenous Once  . DISCONTD: Chlorhexidine Gluconate Cloth  6 each Topical Q0600  . DISCONTD: folic acid  1 mg Oral Daily  . DISCONTD: levofloxacin (LEVAQUIN) IV  750 mg Intravenous Q24H  . DISCONTD: OxyCODONE  80 mg Oral Q12H  . DISCONTD: pregabalin  50 mg Oral Daily   Continuous Infusions:   . sodium chloride 125 mL/hr (02/05/12 0137)    Principal Problem:  *Acute respiratory failure with hypercapnia Active Problems:  Sickle cell anemia with crisis  Pruritus  Leukocytosis  Anemia  Dehydration  Respiratory acidosis    Time spent: > 35 mins    [facility]  [facility] Pager [phone]. If 8PM-8AM, please contact night-coverage at [email or web address], password [facility] 02/05/2012, 9:32 AM  LOS: 1 day

## 2012-02-05 NOTE — Progress Notes (Signed)
Name: [name] MRN: [id number] DOB: Feb 21, 1981    LOS: 1    History of Present Illness:  31 yobm with type SS Sickle cell, presented to [facility] on 10/28 w/ 2 d h/o progressive right groin and lower back pain typical of his sickle crises. Per pt had seen his PCP on 10/25 w/ CC: cough w/ purulent sputum, for which he was treated w/ azithro and cough suppressant. He reports that the cough improved some, however he began to notice low back, and right groin pain which has worsened in intensity to the point he sought out evaluation in the ER on 10/28. On arrival his room air pulse ox was noted to be 77%, w/ ABG demonstrating both hypercarbic and hypoxic resp failure. His CXR was low volume, but did not demonstrate significant airspace disease. He denies any chest pain, shortness of breath (even with activity), wheeze or sick exposure. His primary complaint is severe right groin pain which currently is still not controlled even after narcotics administration in the ER. He is to be admitted to the ICU for treatment of Sickle cell crisis and further evaluation of his hypoxia.   Lines / Drains:  Cultures: BCX2 10/28>>> UC 10/28>>> U strep 10/28>>>neg U legionella 10/28>>>  Antibiotics: levaquin 10/28>>>  Tests / Events: CT chest 10/29: neg PE/ mild edema  Vital Signs: BP 117/61  Pulse 104  Temp 98.5 F (36.9 C) (Oral)  Resp 24  Ht 5\' 6"  (1.676 m)  Wt 74.8 kg (164 lb 14.5 oz)  BMI 26.62 kg/m2  SpO2 93% 4 liters       . sodium chloride 125 mL/hr (02/05/12 0137)     Intake/Output Summary (Last 24 hours) at 02/05/12 0826 Last data filed at 02/05/12 0800  Gross per 24 hour  Intake   2265 ml  Output   1550 ml  Net    715 ml    Physical Examination: General:  Well developed 31 year old male, pain seems a little better controlled.  Neuro:  Awake, oriented X 3. No distress. No focal def  HEENT:  [place]. No JVD  Cardiovascular:  rrr Lungs:  Clear  Abdomen:  Soft, non-tender   Musculoskeletal:  Right groin pain. Right LE CMS intact  Skin:  intact  Ventilator settings: Vent Mode:  [-]  FiO2 (%):  [4 %] 4 %  Labs and Imaging:   Lab 02/05/12 0330 02/04/12 1528 02/04/12 1100  NA 138 138 136  K 4.6 4.3 4.6  CL 106 106 101  CO2 23 23 24   BUN 14 12 17   CREATININE 1.13 1.15 1.55*  GLUCOSE 122* 97 120*    Lab 02/05/12 0330 02/04/12 1528 02/04/12 1100  HGB 9.0* 8.6* 9.8*  HCT 23.5* 22.5* 25.5*  WBC 15.1* 16.3* 15.6*  PLT 226 229 271   ABG    Component Value Date/Time   PHART 7.289* 02/04/2012 1320   PCO2ART 51.0* 02/04/2012 1320   PO2ART 55.8* 02/04/2012 1320   HCO3 23.7 02/04/2012 1320   TCO2 23.1 02/04/2012 1320   ACIDBASEDEF 2.4* 02/04/2012 1320   O2SAT 71.9 02/04/2012 1320    PCXR: low volume, mild progression in bilateral airspace disease   Assessment and Plan: Acute Hypoxic and Hypercarbic respiratory failure. Initially think hypercarbia is due to narcotics as he had received several doses of fentanyl and dilaudid in ER just prior to this lab draw. Also not sure if initial ABG was accurate given his clinical exam. On 10/30 his  CXR does demonstrate some worsening aeration however his O2 requirements are improved. He remains at high risk for Acute Chest Syndrome.  Plan: Supplemental oxygen Treat Sickle cell crisis-->exchange treatment scheduled for today  Systemic anticoagulation per heme F/u CXR   Sickle Cell crisis w/ H/o sickle cell anemia (requiring prior transfusions): seemingly triggered by recent URI. Hgb drifting, but he is for exchange transfusion today.  Plan: IV hydration Exchange transfusion Supplemental oxygen  Analgesia Place PICC Additional recs per heme   SIRS (in setting of leukocytosis and tachycardia). Has no clear defining etiology for sepsis at this point but on diff dx.  PCT is reassuring   Lab 02/05/12 0330 02/04/12 1641 02/04/12 1528 02/04/12 1100  PROCALCITON 0.31 0.25 -- --  WBC 15.1* -- 16.3* 15.6*    LATICACIDVEN -- -- -- --  Plan F/u PCT and culture data Empirical levaquin would cover CAP (although no infiltrates), URI and Lower below the belt infections as well.   Dehydration/ acute renal failure:  Recent Labs  Basename 02/05/12 0330 02/04/12 1528 02/04/12 1100   CREATININE 1.13 1.15 1.55*  Improved w/ hydration   Plan: Cont IV hydration   Hyperglycemia Plan: Per # 1 service   Best practices / Disposition: -->ICU status under [facility] -->full code -->Heparin for DVT Px -->Protonix for GI Px     BABCOCK,PETE 02/05/2012, 8:26 AM  Attending:  I have seen and examined the patient with nurse practitioner/resident and agree with the note above.   I know Mr. Heckler from a previous admission.  I agree that this is acute chest syndrome which can mimic severe CAP.  However I would like to add ceftaz in addition to the levaquin given his fever and infiltrate and poor evidence for fluoroquinolone monotherapy in ICU level CAP patients.  I agree that his ABG from does not correlate with his current normal mental status and lack of respiratory distress.  No indication for mechanical ventilatory support now.  The PICC is in place now, exchange transfusion to start today.  I hope that he does not develop pulmonary hypertension but is certainly at risk.  Yolonda Kida PCCM Pager: [phone] Cell: [phone] If no response, call [phone]

## 2012-02-05 NOTE — Progress Notes (Signed)
ANTIBIOTIC CONSULT NOTE - INITIAL  Pharmacy Consult for Ceftazidime Indication: pneumonia (Severe CAP)  Allergies  Allergen Reactions  . Morphine And Related Rash  . Dilaudid (Hydromorphone Hcl)     dilusional only with 4mg  dose. Can't take "too much"  . Promethazine Hcl Hives    Patient Measurements: Height: 5\' 6"  (167.6 cm) Weight: 164 lb 14.5 oz (74.8 kg) IBW/kg (Calculated) : 63.8   Vital Signs: Temp: 98.2 F (36.8 C) (10/29 0800) Temp src: Oral (10/29 0800) BP: 117/61 mmHg (10/29 0500) Pulse Rate: 102  (10/29 0800) Intake/Output from previous day: 10/28 0701 - 10/29 0700 In: 2140 [P.O.:240; I.V.:1750; IV Piggyback:150] Out: 1550 [Urine:1550]  Labs:  [facility] 02/05/12 0330 02/04/12 1528 02/04/12 1100  WBC 15.1* 16.3* 15.6*  HGB 9.0* 8.6* 9.8*  PLT 226 229 271  LABCREA -- -- --  CREATININE 1.13 1.15 1.55*   Estimated Creatinine Clearance: 84.7 ml/min (by C-G formula based on Cr of 1.13).   Microbiology: Recent Results (from the past 720 hour(s))  CULTURE, BLOOD (ROUTINE X 2)     Status: Normal (Preliminary result)   Collection Time   02/04/12  1:27 PM      Component Value Range Status Comment   Specimen Description BLOOD RIGHT ARM   Final    Special Requests BOTTLES DRAWN AEROBIC AND ANAEROBIC 10CC   Final    Culture  Setup Time 02/04/2012 20:52   Final    Culture     Final    Value:        BLOOD CULTURE RECEIVED NO GROWTH TO DATE CULTURE WILL BE HELD FOR 5 DAYS BEFORE ISSUING A FINAL NEGATIVE REPORT   Report Status PENDING   Incomplete   CULTURE, BLOOD (ROUTINE X 2)     Status: Normal (Preliminary result)   Collection Time   02/04/12  1:29 PM      Component Value Range Status Comment   Specimen Description BLOOD RIGHT HAND   Final    Special Requests BOTTLES DRAWN AEROBIC AND ANAEROBIC 10CC   Final    Culture  Setup Time 02/04/2012 20:52   Final    Culture     Final    Value:        BLOOD CULTURE RECEIVED NO GROWTH TO DATE CULTURE WILL BE HELD FOR 5  DAYS BEFORE ISSUING A FINAL NEGATIVE REPORT   Report Status PENDING   Incomplete   MRSA PCR SCREENING     Status: Abnormal   Collection Time   02/04/12  4:19 PM      Component Value Range Status Comment   MRSA by PCR POSITIVE (*) NEGATIVE Final   CULTURE, EXPECTORATED SPUTUM-ASSESSMENT     Status: Normal   Collection Time   02/04/12  7:00 PM      Component Value Range Status Comment   Specimen Description SPUTUM   Final    Special Requests NONE   Final    Sputum evaluation     Final    Value: THIS SPECIMEN IS ACCEPTABLE. RESPIRATORY CULTURE REPORT TO FOLLOW.   Report Status 02/04/2012 FINAL   Final   CULTURE, RESPIRATORY     Status: Normal (Preliminary result)   Collection Time   02/04/12  7:00 PM      Component Value Range Status Comment   Specimen Description SPUTUM   Final    Special Requests NONE   Final    Gram Stain     Final    Value: NO WBC SEEN  FEW SQUAMOUS EPITHELIAL CELLS PRESENT     MODERATE GRAM POSITIVE COCCI IN CLUSTERS     IN PAIRS MODERATE GRAM NEGATIVE RODS     FEW GRAM POSITIVE RODS   Culture PENDING   Incomplete    Report Status PENDING   Incomplete     Medical History: Past Medical History  Diagnosis Date  . Sickle cell anemia     Medications:  Anti-infectives     Start     Dose/Rate Route Frequency Ordered Stop   02/04/12 1700   levofloxacin (LEVAQUIN) IVPB 750 mg        750 mg 100 mL/hr over 90 Minutes Intravenous Every 24 hours 02/04/12 1606     02/04/12 1615   levofloxacin (LEVAQUIN) IVPB 750 mg  Status:  Discontinued        750 mg 100 mL/hr over 90 Minutes Intravenous Every 24 hours 02/04/12 1609 02/04/12 1612         Assessment:  31 YOM admit with sickle cell crisis and hypoxia.  Failed outpt course of azithromycin.  Pharmacy asked to dose ceftazidime for severe CAP.  Tm 102.2, WBC 15.1, CrCl ~ 85 ml/min  Goal of Therapy:  Appropriate renal dosing  Plan:   Ceftazidime 2g IV q8h.  Levaquin 750mg  IV q24h remains  appropriate per MD dosing  Follow up renal fxn and culture results.   Lynann Beaver PharmD, BCPS Pager [phone] 02/05/2012 11:30 AM

## 2012-02-05 NOTE — Progress Notes (Addendum)
Shift Event: Paged by RN 2/2 pt becoming more disoriented and restless. On 02 per [place] and is satting normally. ABG done earlier today which showed some acidosis. NP to bedside. Dr. Houston Siren also in ICU so he reviewed chart with this NP.  S: pt can not participate in ROS 2/2 mental status. O: VS reviewed. ABG reviewed. Restless, disoriented AAM. He is presently scratching himself with his eyes closed and moves around in bed. He is not answering my questions at this moment. He is not in toxic distress. His resp effort is only slightly increased. His family is at bedside.  A/P:  1. Acute resp failure with acidosis. Will convert to 100% ventimask and use Fentanyl to sedate pt and for pain. D/C Dilaudid while using Fentanyl. Watch VS and mental status. R/P ABG in a few hours. RN to call if issues.  Family updated at bedside and are happy with plan. Dr. Conley Rolls present.  2. SCC-cont pain meds but will switch to Fentanyl as pt has had difficulty with mental status changes on high doses of Dilaudid in the past. Cont IV hydration, itching meds prn. Getting exchange transfusions. Followed by heme. 3. URI ? PNA-follow CXR. On levaquin. I checked chart for HIV test and do not see where he has ever had one. Attending/heme may want to consider if not done recently.  Maren Reamer, NP [facility] Ammend: After Fentanyl ordered, PharmD called and stated there is an increased risk of Fentanyl toxicity with Emend, which heme started today for itching. Therefore, we are reduced to using dilaudid only at this time. 2mg  q2hrs as needed and will follow closely.  Update:  RN called with new ABG results. Much improved. Pt is now alert and oriented. Pt decreased to Venti mask at 50% and will follow.   Maren Reamer, NP

## 2012-02-05 NOTE — Progress Notes (Signed)
Spoke with Dr. Myna Hidalgo regarding pt reaction to dilaudid. Pt reported confusion and trouble breathing when he received greater than 4mg  at one dose, or if he receives dilaudid too frequently.  PRN dose for 4mg  dilaudid Q2hours written.

## 2012-02-05 NOTE — Progress Notes (Signed)
Peripherally Inserted Central Catheter/Midline Placement  The IV Nurse has discussed with the patient and/or persons authorized to consent for the patient, the purpose of this procedure and the potential benefits and risks involved with this procedure.  The benefits include less needle sticks, lab draws from the catheter and patient may be discharged home with the catheter.  Risks include, but not limited to, infection, bleeding, blood clot (thrombus formation), and puncture of an artery; nerve damage and irregular heat beat.  Alternatives to this procedure were also discussed.  PICC/Midline Placement Documentation  PICC / Midline Double Lumen 02/05/12 PICC Right Brachial (Active)       Stacie Glaze Horton 02/05/2012, 10:39 AM

## 2012-02-05 NOTE — Consult Note (Signed)
NAMEKENYETTA, FIFE NO.:  [id number]  MEDICAL RECORD NO.:  [id number]  LOCATION:  1225                         FACILITY:  [facility]  PHYSICIAN:  Josph Macho, M.D.  DATE OF BIRTH:  06-10-1980  DATE OF CONSULTATION: DATE OF DISCHARGE:                                CONSULTATION   REFERRING PHYSICIAN:  Ramiro Harvest, MD.  REASON FOR CONSULTATION:  Sickle cell crisis.  HISTORY OF PRESENT ILLNESS:  Mr. 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  He has been under fairly good control.  It has been about 5 months since he had a crisis.  He is on Hydrea, which helps his hemoglobin F levels.  He is also on folic acid.  He apparently developed "cold", I guess last week.  He had a cough, some sputum.  He had no fever.  He began to have pain in the back and in the thighs.  This is typical for his crises.  He was admitted.  He had some hypoxia.  He underwent a chest x-ray, which did not show any infiltrate.  He had low lung volumes.  He had requisite CT angiogram of his chest.  This was done, I suspect because his D-dimer was elevated at 7.8.  I am unsure exactly what D- dimer indicates with sickle cell crisis.  Again I do not have a report from the CT scan.  He is brought to the ICU.  He is scheduled to have a PICC line placed.  He did have exchange transfusion.  He has had no obvious fever.  He has had no diarrhea.  There has been no change in his medications.  We just saw him in the office 2 weeks ago and he was feeling well.  At home, he is on OxyContin and oxycodone.  Unfortunately, when OxyContin and oxycodone mix with Dilaudid, he gets severe reaction with confusion and almost respiratory arrest.  While he is hospitalized, I tried to avoid his oral narcotics and just go with IV until he stabilizes.  When he was admitted, his lab work showed a white cell count of 16.3, hemoglobin 8.6, hematocrit  22.5, and platelet count 229.  We were asked to see him to help management of the sickle cell.  Thankfully, iron overload has not been much of a problem for him.  His ferritin back in early October was 347.  His iron saturation was 39%.  When we saw him back in early October, his hemoglobin was 11.7 and hematocrit 31.2.  PAST MEDICAL HISTORY:  Well documented in the chart.  ALLERGIES: 1. MORPHINE. 2. PROMETHAZINE.  ADMISSION MEDICATIONS:  Documented in the medical record.  He is on, 1. Hydrea 500 mg p.o. b.i.d. 2. OxyContin 80 mg p.o. q.12 hours. 3. Oxycodone 30 mg p.o. q.6 hours p.r.n. 4. Xanax 1 mg p.o. t.i.d. p.r.n. 5. Folic acid 1 mg p.o. daily. 6. He was briefly started on Malarone (250/100) to be taken for a     planned trip to [place].  FAMILY HISTORY:  Remarkable for sickle cell in the family.  SOCIAL HISTORY:  Negative for tobacco use.  He has rare alcohol  use.  PHYSICAL EXAMINATION:  GENERAL:  This is a fairly well-developed, well- nourished black gentleman in no obvious distress.  He is having some pain, mostly in his thighs. VITAL SIGNS:  Temperature of 99.6, pulse 108, respiratory rate 26, blood pressure 121/65. HEENT:  Normocephalic, atraumatic skull.  There is no scleral icterus. He has no oral lesions. NECK:  There is no adenopathy in the neck. LUNGS:  Clear bilaterally.  He has some slight wheezes more so over on the left side. CARDIAC:  Tachycardic, but regular.  He has no murmurs, rubs, or bruits. ABDOMEN:  Soft.  Good bowel sounds.  There is no palpable fluid wave. There is no palpable hepatosplenomegaly. EXTREMITIES:  Some tenderness over the long bones.  No edema is noted in his legs. SKIN:  No ulcerations. NEUROLOGICAL:  No focal neurological deficits.  IMPRESSION:  Mr. Pierotti is a 31 year old man with hemoglobin SS disease.  He now has another crisis.  He will need to be exchanged.  As always, it takes quite a while to get a PICC line put into  him.  As such, we cannot do anything until the PICC line is put in.  He probably need to be exchanged for 3 or 4 days in a row.  This will get him through the crisis.  I also believe that full-dose Lovenox would help.  There are no studies which have shown that this has been helpful with decreasing his crises duration.  He is on IV antibiotics.  This will continue.  Again, he has problems when his oxycodone is mixed with Dilaudid.  As such, we will just hold on the oxycodone for now until he stabilizes and transition him back to oxycodone and OxyContin.  Typically, he is in the hospital for about 7 days at least.  This would not surprise me if he takes 7-10 days to get through this crisis.  We will certainly follow along.  We will try to help out as much as we can.     Josph Macho, M.D.     PRE/MEDQ  D:  02/04/2012  T:  02/05/2012  Job:  [id number]

## 2012-02-06 DIAGNOSIS — R410 Disorientation, unspecified: Secondary | ICD-10-CM | POA: Diagnosis present

## 2012-02-06 DIAGNOSIS — D649 Anemia, unspecified: Secondary | ICD-10-CM

## 2012-02-06 DIAGNOSIS — D72829 Elevated white blood cell count, unspecified: Secondary | ICD-10-CM

## 2012-02-06 DIAGNOSIS — J189 Pneumonia, unspecified organism: Secondary | ICD-10-CM

## 2012-02-06 DIAGNOSIS — E860 Dehydration: Secondary | ICD-10-CM

## 2012-02-06 DIAGNOSIS — R404 Transient alteration of awareness: Secondary | ICD-10-CM

## 2012-02-06 LAB — BLOOD GAS, ARTERIAL
Acid-base deficit: 1.4 mmol/L (ref 0.0–2.0)
Bicarbonate: 23.8 mEq/L (ref 20.0–24.0)
Drawn by: 244901
TCO2: 25.2 mmol/L (ref 0–100)
pCO2 arterial: 46.3 mmHg — ABNORMAL HIGH (ref 35.0–45.0)
pH, Arterial: 7.333 — ABNORMAL LOW (ref 7.350–7.450)
pO2, Arterial: 172 mmHg — ABNORMAL HIGH (ref 80.0–100.0)

## 2012-02-06 LAB — CBC
HCT: 27.1 % — ABNORMAL LOW (ref 39.0–52.0)
Hemoglobin: 9.9 g/dL — ABNORMAL LOW (ref 13.0–17.0)
MCV: 82.9 fL (ref 78.0–100.0)
RDW: 21.1 % — ABNORMAL HIGH (ref 11.5–15.5)
WBC: 13.3 10*3/uL — ABNORMAL HIGH (ref 4.0–10.5)

## 2012-02-06 LAB — BASIC METABOLIC PANEL
BUN: 8 mg/dL (ref 6–23)
CO2: 25 mEq/L (ref 19–32)
Chloride: 109 mEq/L (ref 96–112)
Creatinine, Ser: 0.82 mg/dL (ref 0.50–1.35)
Glucose, Bld: 83 mg/dL (ref 70–99)
Potassium: 4.2 mEq/L (ref 3.5–5.1)

## 2012-02-06 LAB — PROCALCITONIN: Procalcitonin: 0.21 ng/mL

## 2012-02-06 MED ORDER — FUROSEMIDE 10 MG/ML IJ SOLN
20.0000 mg | Freq: Once | INTRAMUSCULAR | Status: AC
  Administered 2012-02-06: 20 mg via INTRAVENOUS
  Filled 2012-02-06: qty 2

## 2012-02-06 MED ORDER — KETOROLAC TROMETHAMINE 15 MG/ML IJ SOLN
15.0000 mg | Freq: Three times a day (TID) | INTRAMUSCULAR | Status: AC
  Administered 2012-02-06 – 2012-02-07 (×6): 15 mg via INTRAVENOUS
  Filled 2012-02-06 (×6): qty 1

## 2012-02-06 MED ORDER — SODIUM CHLORIDE 0.45 % IV SOLN
INTRAVENOUS | Status: DC
  Administered 2012-02-06 – 2012-02-07 (×2): via INTRAVENOUS
  Administered 2012-02-09: 50 mL/h via INTRAVENOUS
  Administered 2012-02-09 – 2012-02-10 (×2): via INTRAVENOUS

## 2012-02-06 NOTE — Progress Notes (Signed)
Name: [name] MRN: [id number] DOB: 30-Sep-1980    LOS: 2    History of Present Illness:  31 yobm with type SS Sickle cell, presented to [facility] on 10/28 w/ 2 d h/o progressive right groin and lower back pain typical of his sickle crises. Per pt had seen his PCP on 10/25 w/ CC: cough w/ purulent sputum, for which he was treated w/ azithro and cough suppressant. He reports that the cough improved some, however he began to notice low back, and right groin pain which has worsened in intensity to the point he sought out evaluation in the ER on 10/28. On arrival his room air pulse ox was noted to be 77%, w/ ABG demonstrating both hypercarbic and hypoxic resp failure. His CXR was low volume, but did not demonstrate significant airspace disease. He denies any chest pain, shortness of breath (even with activity), wheeze or sick exposure. His primary complaint is severe right groin pain which currently is still not controlled even after narcotics administration in the ER. He is to be admitted to the ICU for treatment of Sickle cell crisis and further evaluation of his hypoxia.   Lines / Drains: PICC 10/29>>>  Cultures: BCX2 10/28>>> UC 10/28>>>no growth U strep 10/28>>>negative U legionella 10/28>>>negative  Antibiotics: levaquin 10/28>>> Ceftazidime 10/29>>> off   Tests / Events: CT chest 10/29: neg PE/ mild edema  Vital Signs: BP 124/59  Pulse 95  Temp 98.3 F (36.8 C) (Axillary)  Resp 15  Ht 5\' 6"  (1.676 m)  Wt 73.4 kg (161 lb 13.1 oz)  BMI 26.12 kg/m2  SpO2 99% 4 liters       . sodium chloride 50 mL/hr at 02/06/12 0828  . DISCONTD: sodium chloride 50 mL/hr at 02/06/12 0729     Intake/Output Summary (Last 24 hours) at 02/06/12 0847 Last data filed at 02/06/12 0700  Gross per 24 hour  Intake   3955 ml  Output   2650 ml  Net   1305 ml    Physical Examination: General:  Well developed 31 year old male, pain seems a little better controlled. Complaining of  itching Neuro:  Awake, oriented X 3. No distress. No focal def  HEENT:  [place]. No JVD  Cardiovascular:  Rapid rhythm, regular rate. Pulses intact Lungs:  Clear  Abdomen:  Soft, non-tender  Musculoskeletal:  Right groin pain. Right LE CMS intact  Skin:  intact  Labs and Imaging:   Lab 02/06/12 0515 02/05/12 0330 02/04/12 1528  NA 140 138 138  K 4.2 4.6 4.3  CL 109 106 106  CO2 25 23 23   BUN 8 14 12   CREATININE 0.82 1.13 1.15  GLUCOSE 83 122* 97    Lab 02/06/12 0515 02/05/12 0330 02/04/12 1528  HGB 9.9* 9.0* 8.6*  HCT 27.1* 23.5* 22.5*  WBC 13.3* 15.1* 16.3*  PLT 213 226 229   ABG    Component Value Date/Time   PHART 7.333* 02/06/2012 0112   PCO2ART 46.3* 02/06/2012 0112   PO2ART 172.0* 02/06/2012 0112   HCO3 23.8 02/06/2012 0112   TCO2 25.2 02/06/2012 0112   ACIDBASEDEF 1.4 02/06/2012 0112   O2SAT 71.9 02/04/2012 1320    PCXR: 10/29>>>pulmonary vascular congestions, no frank edema  Assessment and Plan: Acute Hypoxic and Hypercarbic respiratory failure. Now resolved with treatment of sickle cell crisis and antibiotics Plan: Respiratory hygiene Supplemental oxygen Treat Sickle cell crisis-->exchange treatment scheduled for today  Systemic anticoagulation per heme   Sickle Cell crisis w/  H/o sickle cell anemia (requiring prior transfusions): seemingly triggered by recent URI. Exchange transfusion today. Plan: Exchange transfusion today  Supplemental oxygen  Additional recs per heme Analgesia with home medications as this may reduce itching    SIRS (in setting of leukocytosis and tachycardia). Negative PCT. Afebrile overnight.  Lab 02/06/12 0515 02/05/12 0330 02/04/12 1641 02/04/12 1528 02/04/12 1100  PROCALCITON 0.21 0.31 0.25 -- --  WBC 13.3* 15.1* -- 16.3* 15.6*  LATICACIDVEN -- -- -- -- --  Plan Follow culture data and narrow Continue current ABX as above Renal dosing per pharmacy  Dehydration/ acute renal failure:  Recent Labs  Basename 02/06/12  0515 02/05/12 0330 02/04/12 1528   CREATININE 0.82 1.13 1.15  Creatinine improved w/ IV hydration  Plan: KVO IV fluids   Hyperglycemia Plan: Per # 1 service   Best practices / Disposition: -->ICU status under triad -->full code -->Heparin for DVT Px -->Protonix for GI Px    PCCM will sign off, call if questions  Yolonda Kida PCCM Pager: [phone] Cell: [phone] If no response, call [phone]

## 2012-02-06 NOTE — Progress Notes (Signed)
Subjective: Had some confusion during the night. Mentation improved. Awake and oriented this morning. Was eating his breakfast.  Objective: Vital signs in last 24 hours: Filed Vitals:   02/06/12 0400 02/06/12 0500 02/06/12 0600 02/06/12 0700  BP: 108/59 102/86 120/66 124/59  Pulse: 85 88 97 95  Temp:      TempSrc:      Resp: 16 14 22 15   Height:      Weight:      SpO2: 98% 93% 97% 99%   Weight change: 0.2 kg (7.1 oz)  Intake/Output Summary (Last 24 hours) at 02/06/12 0804 Last data filed at 02/06/12 0700  Gross per 24 hour  Intake   3955 ml  Output   2650 ml  Net   1305 ml    Physical Exam: General: Awake, Oriented, No acute distress. HEENT: EOMI. Neck: Supple CV: S1 and S2 Lungs: Clear to ascultation bilaterally Abdomen: Soft, Nontender, Nondistended, +bowel sounds. Ext: Good pulses. Trace edema.  Lab Results: Basic Metabolic Panel:  Lab 02/06/12 9604 02/05/12 0330 02/04/12 1640 02/04/12 1528 02/04/12 1100  NA 140 138 -- 138 136  K 4.2 4.6 -- 4.3 4.6  CL 109 106 -- 106 101  CO2 25 23 -- 23 24  GLUCOSE 83 122* -- 97 120*  BUN 8 14 -- 12 17  CREATININE 0.82 1.13 -- 1.15 1.55*  CALCIUM 8.6 8.4 -- 8.1* 9.3  MG -- 2.2 1.7 -- --  PHOS -- -- -- -- --   Liver Function Tests:  Lab 02/05/12 0330 02/04/12 1100  AST 102* 123*  ALT 36 42  ALKPHOS 86 105  BILITOT 2.8* 4.4*  PROT 6.4 7.3  ALBUMIN 3.6 4.5   No results found for this basename: LIPASE:5,AMYLASE:5 in the last 168 hours  Lab 02/04/12 1640  AMMONIA 65*   CBC:  Lab 02/06/12 0515 02/05/12 0330 02/04/12 1528 02/04/12 1100  WBC 13.3* 15.1* 16.3* 15.6*  NEUTROABS -- 10.9* -- 10.6*  HGB 9.9* 9.0* 8.6* 9.8*  HCT 27.1* 23.5* 22.5* 25.5*  MCV 82.9 81.6 81.8 81.2  PLT 213 226 229 271   Cardiac Enzymes:  Lab 02/05/12 0201 02/04/12 2008 02/04/12 1310  CKTOTAL -- -- --  CKMB -- -- --  CKMBINDEX -- -- --  TROPONINI <0.30 <0.30 <0.30   BNP (last 3 results)  Basename 02/04/12 1641  PROBNP 158.2*    CBG: No results found for this basename: GLUCAP:5 in the last 168 hours No results found for this basename: HGBA1C:5 in the last 72 hours Other Labs: No components found with this basename: POCBNP:3  Lab 02/04/12 1327  DDIMER 7.80*   No results found for this basename: CHOL:2,HDL:2,LDLCALC:2,TRIG:2,CHOLHDL:2,LDLDIRECT:2 in the last 168 hours No results found for this basename: TSH,T4TOTAL,FREET3,T3FREE,FREET4,THYROIDAB in the last 168 hours  Lab 02/05/12 0330 02/04/12 1100  VITAMINB12 -- --  FOLATE -- --  FERRITIN -- --  TIBC -- --  IRON -- --  RETICCTPCT 26.0* 24.8*    Micro Results: Recent Results (from the past 240 hour(s))  CULTURE, BLOOD (ROUTINE X 2)     Status: Normal (Preliminary result)   Collection Time   02/04/12  1:27 PM      Component Value Range Status Comment   Specimen Description BLOOD RIGHT ARM   Final    Special Requests BOTTLES DRAWN AEROBIC AND ANAEROBIC 10CC   Final    Culture  Setup Time 02/04/2012 20:52   Final    Culture     Final    Value:  BLOOD CULTURE RECEIVED NO GROWTH TO DATE CULTURE WILL BE HELD FOR 5 DAYS BEFORE ISSUING A FINAL NEGATIVE REPORT   Report Status PENDING   Incomplete   CULTURE, BLOOD (ROUTINE X 2)     Status: Normal (Preliminary result)   Collection Time   02/04/12  1:29 PM      Component Value Range Status Comment   Specimen Description BLOOD RIGHT HAND   Final    Special Requests BOTTLES DRAWN AEROBIC AND ANAEROBIC 10CC   Final    Culture  Setup Time 02/04/2012 20:52   Final    Culture     Final    Value:        BLOOD CULTURE RECEIVED NO GROWTH TO DATE CULTURE WILL BE HELD FOR 5 DAYS BEFORE ISSUING A FINAL NEGATIVE REPORT   Report Status PENDING   Incomplete   MRSA PCR SCREENING     Status: Abnormal   Collection Time   02/04/12  4:19 PM      Component Value Range Status Comment   MRSA by PCR POSITIVE (*) NEGATIVE Final   URINE CULTURE     Status: Normal   Collection Time   02/04/12  6:58 PM      Component  Value Range Status Comment   Specimen Description URINE, CLEAN CATCH   Final    Special Requests NONE   Final    Culture  Setup Time 02/05/2012 02:18   Final    Colony Count NO GROWTH   Final    Culture NO GROWTH   Final    Report Status 02/05/2012 FINAL   Final   CULTURE, EXPECTORATED SPUTUM-ASSESSMENT     Status: Normal   Collection Time   02/04/12  7:00 PM      Component Value Range Status Comment   Specimen Description SPUTUM   Final    Special Requests NONE   Final    Sputum evaluation     Final    Value: THIS SPECIMEN IS ACCEPTABLE. RESPIRATORY CULTURE REPORT TO FOLLOW.   Report Status 02/04/2012 FINAL   Final   CULTURE, RESPIRATORY     Status: Normal (Preliminary result)   Collection Time   02/04/12  7:00 PM      Component Value Range Status Comment   Specimen Description SPUTUM   Final    Special Requests NONE   Final    Gram Stain     Final    Value: NO WBC SEEN     FEW SQUAMOUS EPITHELIAL CELLS PRESENT     MODERATE GRAM POSITIVE COCCI IN CLUSTERS     IN PAIRS MODERATE GRAM NEGATIVE RODS     FEW GRAM POSITIVE RODS   Culture     Final    Value: NORMAL OROPHARYNGEAL FLORA     Note: MICROSCOPIC FINDINGS SUGGEST THAT THIS SPECIMEN IS NOT REPRESENTATIVE OF LOWER RESPIRATORY SECRETIONS. PLEASE RECOLLECT.   Report Status PENDING   Incomplete     Studies/Results: Dg Lumbar Spine Complete  02/04/2012  *RADIOLOGY REPORT*  Clinical Data: Sickle cell pain crisis.  LUMBAR SPINE - COMPLETE 4+ VIEW  Comparison: 09/06/2010.  Findings: Alignment is anatomic.  Vertebral body and disc space height are maintained.  Diffuse concavity of the endplates is again noted.  Patchy sclerosis in the visualized osseous structures, as before.  IMPRESSION: Osseous changes of sickle cell without acute finding.   Original Report Authenticated By: Reyes Ivan, M.D.    Dg Hip Complete Right  02/04/2012  *RADIOLOGY REPORT*  Clinical Data: Sickle cell pain crisis.  RIGHT HIP - COMPLETE 2+ VIEW   Comparison: MR left hip 09/17/2011 and left hip radiographs 09/16/2011.  Findings: Patchy sclerosis in the femoral heads and pelvis, as before.  No evidence of acute fracture.  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   Original Report Authenticated By: Reyes Ivan, M.D.    Ct Angio Chest Pe W/cm &/or Wo Cm  02/05/2012  *RADIOLOGY REPORT*  Clinical Data: Hypoxia.  Sickle cell crisis.  Elevated white count.  The patient was initially scanned at to 03:35 p.m. 10/28/ 2013. The contrast bolus was missed in the study is nondiagnostic.  The patient was brought back at 12:26 a.m. and reimaged.  CT ANGIOGRAPHY CHEST  Technique:  Multidetector CT imaging of the chest using the standard protocol during bolus administration of intravenous contrast. Multiplanar reconstructed images including MIPs were obtained and reviewed to evaluate the vascular anatomy.  Contrast: OMNIPAQUE IOHEXOL 350 MG/ML SOLN  Comparison: None.  Findings: Pulmonary arterial opacification is satisfactory.  No focal filling defects are evident to suggest pulmonary emboli.  The heart is mildly enlarged.  No significant pleural or pericardial effusions are present.  The spleen is calcified and shrunken.  The upper abdomen is otherwise unremarkable.  Mild dependent atelectasis is present.  Diffuse ground-glass attenuation is evident, likely reflecting mild edema.  No focal airspace disease is present.  Bone windows demonstrate sclerotic changes and end plate concavities typical of sickle cell anemia.  No acute abnormalities are present.  IMPRESSION:  1.  No evidence for pulmonary embolus. 2.  Cardiomegaly with mild edema. 3.  Sclerotic bone changes compatible with sickle cell anemia. 4.  Chronic calcified changes of the spleen.   Original Report Authenticated By: Jamesetta Orleans. MATTERN, M.D.    Dg Chest Port 1 View  02/05/2012  *RADIOLOGY REPORT*  Clinical Data: Sickle  cell disease.  Acute respiratory failure.  PORTABLE CHEST - 1 VIEW  Comparison: CT and plain film chest 02/04/2012.  Findings: There is cardiomegaly with some vascular congestion.  No consolidative process, frank edema, pneumothorax or pleural fluid is identified.  Multi focal bone infarcts are noted.  IMPRESSION: Cardiomegaly and pulmonary vascular congestion without frank edema or focal process.   Original Report Authenticated By: Bernadene Bell. Maricela Curet, M.D.    Dg Chest Port 1 View  02/04/2012  *RADIOLOGY REPORT*  Clinical Data: Pain.  Sickle cell.  PORTABLE CHEST - 1 VIEW  Comparison: 09/19/2011.  Findings: Trachea is midline.  Heart is mildly enlarged.  Lungs are somewhat low in volume with probable vascular crowding.  No pleural fluid.  IMPRESSION: Low lung volumes.  No acute findings.   Original Report Authenticated By: Reyes Ivan, M.D.     Medications: I have reviewed the patient's current medications. Scheduled Meds:   . antiseptic oral rinse  15 mL Mouth Rinse BID  . aprepitant  80 mg Oral Daily  . Chlorhexidine Gluconate Cloth  6 each Topical Q0600  . docusate sodium  100 mg Oral BID  . enoxaparin (LOVENOX) injection  70 mg Subcutaneous Q12H  . folic acid  2 mg Oral Daily  . furosemide  20 mg Intravenous Once  . hydroxyurea  1,000 mg Oral Daily  . ketorolac  15 mg Intravenous Q8H  . levofloxacin (LEVAQUIN) IV  750 mg Intravenous Q24H  . multivitamin with minerals  1 tablet Oral Daily  . mupirocin ointment  1 application Nasal  BID  . pantoprazole  40 mg Oral Daily   Or  . pantoprazole (PROTONIX) IV  40 mg Intravenous Daily  . sodium chloride  10-40 mL Intracatheter Q12H   Continuous Infusions:   . sodium chloride    . DISCONTD: sodium chloride 50 mL/hr at 02/06/12 0729   PRN Meds:.acetaminophen, acetaminophen, bisacodyl, diphenhydrAMINE, HYDROmorphone (DILAUDID) injection, hydrOXYzine, LORazepam, LORazepam, ondansetron (ZOFRAN) IV, ondansetron, polyethylene glycol,  sodium chloride, DISCONTD: fentaNYL, DISCONTD:  HYDROmorphone (DILAUDID) injection, DISCONTD:  HYDROmorphone (DILAUDID) injection  Assessment/Plan: Acute respiratory acidosis  Likely secondary to pain medications. Patient however does not look in respiratory distress. Patient on oxygen. CT angiogram of the chest was negative for PE. Chest x-ray with some appearance of a vascular congestion. We'll need to monitor closely for development of ARDS. Patient is to receive exchange transfusions today. Monitor. Pulmonary and critical care medicine is following and appreciate input and recommendations.   Acute sickle cell crisis  Likely triggered by respiratory infection. Patient still with significant pain and itching all over, seems improved today. CT angiogram of the chest was negative for pulmonary embolus. Per hematology to continue on empiric Lovenox for a sickle cell crisis. Continue IV pain medication. Continue hydroxyzine as needed for itching. Continue Benadryl as needed for itching. Patient has been started on the Emend per hematology for itching. Continue hydration and folic acid. Patient had exchange transfusion yesterday (02/05/2012), another exchange transfusion is planned for today. Hematology is following and appreciate input and recommendations.   Acute delirium/Altered mental status/Acue Encephalopathy Likely due to sickle cell crisis and pain medications. Improved.  Leukocytosis  Unclear etiology, likely due to acute sickle cell crisis. Patient was recently treated with a Z-Pak. Sputum Gram stain negative. Blood and urine cultures shows no growth to date. Chest x-ray is negative for acute infiltrate. Continue empiric IV Levaquin started since 02/04/2012, de-escalate quickly if no further fevers and if cultures continue to remain negative. Follow.   Dehydration  IV fluids   Anemia  Secondary to sickle cell crisis. Patient to undergo another exchange transfusion today. Patient hematology.    Prophylaxis  PPI for GI prophylaxis. Lovenox for DVT prophylaxis.   Code Status: Full  Family Communication: Updated patient at bedside  Disposition Plan: Home when medically stable   Consultants:  Critical care medicine: Dr. Sherene Sires 02/04/2012  Hematology oncology: Dr. Myna Hidalgo 02/04/2012  Procedures:  CXR 02/04/12, 02/05/12  Plain films of the right hip 02/04/2012  Plain films of the L-spine 02/04/2012  CT of the chest 02/04/2012  PICC line 02/05/2012  Antibiotics:  IV Levaquin 02/04/12.   LOS: 2 days  Burkley Dech A, MD 02/06/2012, 8:04 AM

## 2012-02-06 NOTE — Progress Notes (Signed)
Events noted last night. I very much appreciate the help by the hospitalist. [name] seems to be close to his baseline status of this morning.  His sickle crises just takes time to resolve. Again I suspect that it will be a be a good 7-10 days at the earliest.  Has first exchange yesterday. We will do another one today. His hemoglobin is holding steady.  A chest x-ray yesterday did show some vascular congestion. I think that his IV fluids can be decreased a little bit. His input is significantly greater than his output. Since we are transfusing him, he does not need all the fluid that he is getting right now.  So far as cultures have been negative. 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   His labs look okay from an electrolyte standpoint. BUN creatinine are normal. Calcium and potassium are okay.  His vital signs are okay. Blood pressure 124/59. He is afebrile. His blood gas shows good oxygenation. He's not he has started an incentive spirometer. I ordered this yesterday. I'm not sure why this is not been started. This is critical to try to decrease his risk of acute chest syndrome.  He still has some pain in his right side. Again this will take several days to resolve.  We will continue to follow along closely. His physical exam is unchanged today from yesterday. He has good breath sounds bilaterally. No wheezes are noted. His cardiac exam shows a regular rate and rhythm. Abdominal exam is soft. He has no hepatosplenomegaly. There is no swelling in his legs. No rashes are noted.  I think the confusion happens with him during these crises. Being in the ICU also may be a factor for his mental status.  I appreciate the great help that the ICU staff is giving him.   [name]  Isaiah 58:11

## 2012-02-07 LAB — BASIC METABOLIC PANEL
CO2: 28 mEq/L (ref 19–32)
Calcium: 8.2 mg/dL — ABNORMAL LOW (ref 8.4–10.5)
Creatinine, Ser: 0.68 mg/dL (ref 0.50–1.35)
GFR calc non Af Amer: >90 mL/min (ref >90)
Glucose, Bld: 87 mg/dL (ref 70–99)

## 2012-02-07 LAB — CBC
MCH: 31.3 pg (ref 26.0–34.0)
MCHC: 36.7 g/dL — ABNORMAL HIGH (ref 30.0–36.0)
MCV: 85.1 fL (ref 78.0–100.0)
Platelets: 172 10*3/uL (ref 150–400)
RBC: 3.36 MIL/uL — ABNORMAL LOW (ref 4.22–5.81)

## 2012-02-07 LAB — CULTURE, RESPIRATORY W GRAM STAIN: Culture: NORMAL

## 2012-02-07 MED ORDER — KETOROLAC TROMETHAMINE 30 MG/ML IJ SOLN
INTRAMUSCULAR | Status: AC
  Administered 2012-02-07: 30 mg via INTRAVENOUS
  Filled 2012-02-07: qty 1

## 2012-02-07 MED ORDER — KETOROLAC TROMETHAMINE 30 MG/ML IJ SOLN
30.0000 mg | Freq: Once | INTRAMUSCULAR | Status: AC
  Administered 2012-02-07: 30 mg via INTRAVENOUS

## 2012-02-07 NOTE — Progress Notes (Signed)
[name] looks better this AM.  No confusion.  Getting the exchanges q day.  Still some pain in the right thigh.  Now on Toradol.  Eating better.  IV fluid rate has been decreased. Still need to watch the I/O and make sure that he gets Lasix in between units of blood.  Cultures are (-).  On Levaquin.  Afebrile.  ?? If this can be d/c'ed.  On therapeutic Lovenox for the sickle crisis.  No bleeding.  No CBC back yet.  Pain may be a little better.  With [name], the crisis will take a good 7-10 days to resolve.  Patience is a virtue!!!  VSS.  HR is lower.  %O2 sat is better.  LUNGS: Good BS bilat COR: RRR ABD: Soft w/ good bowel sounds.  (-) HSM EXT:  Some tenderness in right thigh.  (-) Swelling. CNS:  No focal deficits.  Will cont the current care protocol.  Exchange today.  Check CBC q day along with retic count.  ?? Move out of ICU in 1-2 days???  Thanks for the great care by everybody!!!!  You are fantastic!!!!  Cindee Lame E.  Isaiah 48:17

## 2012-02-07 NOTE — Progress Notes (Signed)
ANTICOAGULATION CONSULT NOTE - Initial Consult  Pharmacy Consult for Lovenox Indication: Sickle cell crisis   Allergies  Allergen Reactions  . Morphine And Related Rash  . Dilaudid (Hydromorphone Hcl)     dilusional only with 4mg  dose. Can't take "too much"  . Promethazine Hcl Hives    Patient Measurements: Height: 5\' 6"  (167.6 cm) Weight: 162 lb 7.7 oz (73.7 kg) IBW/kg (Calculated) : 63.8  Weight: 72.3kg  Vital Signs: Temp: 98 F (36.7 C) (10/31 0800) Temp src: Oral (10/31 0800) BP: 122/83 mmHg (10/31 1000) Pulse Rate: 78  (10/31 1000)  Labs:  Basename 02/07/12 0900 02/06/12 0515 02/05/12 0330 02/05/12 0201 02/04/12 2008 02/04/12 1310  HGB 10.5* 9.9* -- -- -- --  HCT 28.6* 27.1* 23.5* -- -- --  PLT 172 213 226 -- -- --  APTT -- -- -- -- -- --  LABPROT -- -- -- -- -- --  INR -- -- -- -- -- --  HEPARINUNFRC -- -- -- -- -- --  CREATININE 0.68 0.82 1.13 -- -- --  CKTOTAL -- -- -- -- -- --  CKMB -- -- -- -- -- --  TROPONINI -- -- -- <0.30 <0.30 <0.30    Estimated Creatinine Clearance: 119.6 ml/min (by C-G formula based on Cr of 0.68).   Medical History: Past Medical History  Diagnosis Date  . Sickle cell anemia    Assessment:  31 yom with h/o sickle cell anemia presented 10/28 with sicle cell crisis.  He continues on treatment dose Lovenox for sickle cell crisis per Dr. Gustavo Lah recommendations .   CBC stable with Hgb improved.  No bleeding/issues reported  Renal function remains wnl and stable.   Goal of Therapy:  Anti-Xa level 0.6-1.2 units/ml 4hrs after LMWH dose given Monitor platelets by anticoagulation protocol: Yes   Plan:   Continue lovenox 70 mg SQ Q12h  CBC q72 hours while on Lovenox and monitor renal function   Karol Liendo, Loma Messing PharmD Pager #: [phone] 11:55 AM 02/07/2012

## 2012-02-07 NOTE — Progress Notes (Signed)
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 Confusion improved. No other specific complaints.   Objective: Vital signs in last 24 hours: Filed Vitals:   02/07/12 0430 02/07/12 0450 02/07/12 0550 02/07/12 0647  BP: 117/77 114/87 127/89 126/81  Pulse: 72 67 75 71  Temp: 97.8 F (36.6 C) 97.9 F (36.6 C) 97.9 F (36.6 C) 98 F (36.7 C)  TempSrc: Axillary Oral Oral Oral  Resp: 9 8 15 14   Height:      Weight:      SpO2:       Weight change: 0.3 kg (10.6 oz)  Intake/Output Summary (Last 24 hours) at 02/07/12 0800 Last data filed at 02/07/12 0550  Gross per 24 hour  Intake 2031.67 ml  Output   2075 ml  Net -43.33 ml    Physical Exam: General: Awake, Oriented, No acute distress. HEENT: EOMI. Neck: Supple CV: S1 and S2 Lungs: Clear to ascultation bilaterally Abdomen: Soft, Nontender, Nondistended, +bowel sounds. Ext: Good pulses. Trace edema.  Lab Results: Basic Metabolic Panel:  Lab 02/06/12 4782 02/05/12 0330 02/04/12 1640 02/04/12 1528 02/04/12 1100  NA 140 138 -- 138 136  K 4.2 4.6 -- 4.3 4.6  CL 109 106 -- 106 101  CO2 25 23 -- 23 24  GLUCOSE 83 122* -- 97 120*  BUN 8 14 -- 12 17  CREATININE 0.82 1.13 -- 1.15 1.55*  CALCIUM 8.6 8.4 -- 8.1* 9.3  MG -- 2.2 1.7 -- --  PHOS -- -- -- -- --   Liver Function Tests:  Lab 02/05/12 0330 02/04/12 1100  AST 102* 123*  ALT 36 42  ALKPHOS 86 105  BILITOT 2.8* 4.4*  PROT 6.4 7.3  ALBUMIN 3.6 4.5   No results found for this basename: LIPASE:5,AMYLASE:5 in the last 168 hours  Lab 02/04/12 1640  AMMONIA 65*   CBC:  Lab 02/06/12 0515 02/05/12 0330 02/04/12 1528 02/04/12 1100  WBC 13.3* 15.1* 16.3* 15.6*  NEUTROABS -- 10.9* -- 10.6*  HGB 9.9* 9.0* 8.6* 9.8*  HCT 27.1* 23.5* 22.5* 25.5*  MCV 82.9 81.6 81.8 81.2  PLT 213 226 229 271   Cardiac Enzymes:  Lab 02/05/12 0201 02/04/12 2008 02/04/12 1310  CKTOTAL -- -- --  CKMB -- -- --  CKMBINDEX -- -- --  TROPONINI  <0.30 <0.30 <0.30   BNP (last 3 results)  Basename 02/04/12 1641  PROBNP 158.2*   CBG: No results found for this basename: GLUCAP:5 in the last 168 hours No results found for this basename: HGBA1C:5 in the last 72 hours Other Labs: No components found with this basename: POCBNP:3  Lab 02/04/12 1327  DDIMER 7.80*   No results found for this basename: CHOL:2,HDL:2,LDLCALC:2,TRIG:2,CHOLHDL:2,LDLDIRECT:2 in the last 168 hours No results found for this basename: TSH,T4TOTAL,FREET3,T3FREE,FREET4,THYROIDAB in the last 168 hours  Lab 02/05/12 0330 02/04/12 1100  VITAMINB12 -- --  FOLATE -- --  FERRITIN -- --  TIBC -- --  IRON -- --  RETICCTPCT 26.0* 24.8*    Micro Results: Recent Results (from the past 240 hour(s))  CULTURE, BLOOD (ROUTINE X 2)     Status: Normal (Preliminary result)   Collection Time   02/04/12  1:27 PM      Component Value Range Status Comment   Specimen Description BLOOD RIGHT ARM   Final    Special Requests BOTTLES DRAWN AEROBIC AND ANAEROBIC 10CC   Final    Culture  Setup Time 02/04/2012 20:52   Final  Culture     Final    Value:        BLOOD CULTURE RECEIVED NO GROWTH TO DATE CULTURE WILL BE HELD FOR 5 DAYS BEFORE ISSUING A FINAL NEGATIVE REPORT   Report Status PENDING   Incomplete   CULTURE, BLOOD (ROUTINE X 2)     Status: Normal (Preliminary result)   Collection Time   02/04/12  1:29 PM      Component Value Range Status Comment   Specimen Description BLOOD RIGHT HAND   Final    Special Requests BOTTLES DRAWN AEROBIC AND ANAEROBIC 10CC   Final    Culture  Setup Time 02/04/2012 20:52   Final    Culture     Final    Value:        BLOOD CULTURE RECEIVED NO GROWTH TO DATE CULTURE WILL BE HELD FOR 5 DAYS BEFORE ISSUING A FINAL NEGATIVE REPORT   Report Status PENDING   Incomplete   MRSA PCR SCREENING     Status: Abnormal   Collection Time   02/04/12  4:19 PM      Component Value Range Status Comment   MRSA by PCR POSITIVE (*) NEGATIVE Final   URINE  CULTURE     Status: Normal   Collection Time   02/04/12  6:58 PM      Component Value Range Status Comment   Specimen Description URINE, CLEAN CATCH   Final    Special Requests NONE   Final    Culture  Setup Time 02/05/2012 02:18   Final    Colony Count NO GROWTH   Final    Culture NO GROWTH   Final    Report Status 02/05/2012 FINAL   Final   CULTURE, EXPECTORATED SPUTUM-ASSESSMENT     Status: Normal   Collection Time   02/04/12  7:00 PM      Component Value Range Status Comment   Specimen Description SPUTUM   Final    Special Requests NONE   Final    Sputum evaluation     Final    Value: THIS SPECIMEN IS ACCEPTABLE. RESPIRATORY CULTURE REPORT TO FOLLOW.   Report Status 02/04/2012 FINAL   Final   CULTURE, RESPIRATORY     Status: Normal (Preliminary result)   Collection Time   02/04/12  7:00 PM      Component Value Range Status Comment   Specimen Description SPUTUM   Final    Special Requests NONE   Final    Gram Stain     Final    Value: NO WBC SEEN     FEW SQUAMOUS EPITHELIAL CELLS PRESENT     MODERATE GRAM POSITIVE COCCI IN CLUSTERS     IN PAIRS MODERATE GRAM NEGATIVE RODS     FEW GRAM POSITIVE RODS   Culture     Final    Value: NORMAL OROPHARYNGEAL FLORA     Note: MICROSCOPIC FINDINGS SUGGEST THAT THIS SPECIMEN IS NOT REPRESENTATIVE OF LOWER RESPIRATORY SECRETIONS. PLEASE RECOLLECT.   Report Status PENDING   Incomplete     Studies/Results: No results found.  Medications: I have reviewed the patient's current medications. Scheduled Meds:    . antiseptic oral rinse  15 mL Mouth Rinse BID  . aprepitant  80 mg Oral Daily  . Chlorhexidine Gluconate Cloth  6 each Topical Q0600  . docusate sodium  100 mg Oral BID  . enoxaparin (LOVENOX) injection  70 mg Subcutaneous Q12H  . folic acid  2 mg Oral Daily  .  furosemide  20 mg Intravenous Once  . hydroxyurea  1,000 mg Oral Daily  . ketorolac  15 mg Intravenous Q8H  . ketorolac  30 mg Intravenous Once  . levofloxacin  (LEVAQUIN) IV  750 mg Intravenous Q24H  . multivitamin with minerals  1 tablet Oral Daily  . mupirocin ointment  1 application Nasal BID  . pantoprazole  40 mg Oral Daily  . sodium chloride  10-40 mL Intracatheter Q12H  . DISCONTD: pantoprazole (PROTONIX) IV  40 mg Intravenous Daily   Continuous Infusions:    . sodium chloride 50 mL/hr at 02/06/12 0828  . DISCONTD: sodium chloride 50 mL/hr at 02/06/12 0729   PRN Meds:.acetaminophen, acetaminophen, bisacodyl, diphenhydrAMINE, HYDROmorphone (DILAUDID) injection, hydrOXYzine, LORazepam, LORazepam, ondansetron (ZOFRAN) IV, ondansetron, polyethylene glycol, sodium chloride  Assessment/Plan: Acute respiratory failure Likely secondary to pain medications, resolved with decreasing his pain medications. Patient however does not look in respiratory distress. Patient on oxygen. CT angiogram of the chest was negative for PE. Chest x-ray with some appearance of a vascular congestion. Will need to monitor closely for development of ARDS. Patient is to receive exchange transfusion again today.   Acute sickle cell crisis  Likely triggered by respiratory infection? Patient still with significant pain and itching all over, seems improved today. CT angiogram of the chest was negative for pulmonary embolus. Per hematology to continue on empiric Lovenox for a sickle cell crisis. Continue IV pain medication. Continue hydroxyzine as needed for itching. Continue Benadryl as needed for itching. Patient has been started on the Emend per hematology for itching. Continue hydration and folic acid. Patient had exchange transfusion with 2 units on 02/05/2012, 02/06/2012, and another exchange transfusion is planned for today. Hematology, Dr. Myna Hidalgo is following and appreciate input and recommendations.   Acute delirium/Altered mental status/Acue Encephalopathy Likely due to sickle cell crisis and pain medications. Improved.  Leukocytosis  Unclear etiology, likely due to  acute sickle cell crisis. Patient was recently treated with a Z-Pak. Sputum Gram stain negative. Blood and urine cultures shows no growth to date. Chest x-ray is negative for acute infiltrate. Discontinue empiric IV Levaquin 02/04/2012-02/07/2012. Blood and urine cultures show no growth to date.  Dehydration  IV fluids.  Anemia  Secondary to sickle cell crisis. Patient to undergo another exchange transfusion today. Patient hematology.   Prophylaxis  PPI for GI prophylaxis. Lovenox for DVT prophylaxis.   Code Status: Full  Family Communication: Updated patient at bedside  Disposition Plan: Home when medically stable, transfer to med-surg later today after exchange transfusion. No events on telemetry for the last 48 hours.   Consultants:  Critical care medicine: Dr. Sherene Sires 02/04/2012, signed off. Hematology oncology: Dr. Myna Hidalgo 02/04/2012  Procedures:  CXR 02/04/12, 02/05/12  Plain films of the right hip 02/04/2012  Plain films of the L-spine 02/04/2012  CT of the chest 02/04/2012  PICC line 02/05/2012  Antibiotics:  IV Levaquin 02/04/12-02/07/2012.   LOS: 3 days  Autie Vasudevan A, MD 02/07/2012, 8:00 AM

## 2012-02-08 DIAGNOSIS — R509 Fever, unspecified: Secondary | ICD-10-CM

## 2012-02-08 LAB — CBC
HCT: 33.2 % — ABNORMAL LOW (ref 39.0–52.0)
Hemoglobin: 11.8 g/dL — ABNORMAL LOW (ref 13.0–17.0)
MCH: 30.6 pg (ref 26.0–34.0)
MCHC: 35.5 g/dL (ref 30.0–36.0)
RDW: 19.8 % — ABNORMAL HIGH (ref 11.5–15.5)

## 2012-02-08 LAB — HEMOGLOBIN A1C
Hgb A1c MFr Bld: 4.3 %
Mean Plasma Glucose: 77 mg/dL

## 2012-02-08 LAB — BASIC METABOLIC PANEL
BUN: 8 mg/dL (ref 6–23)
Calcium: 8.8 mg/dL (ref 8.4–10.5)
Creatinine, Ser: 0.76 mg/dL (ref 0.50–1.35)
GFR calc non Af Amer: >90 mL/min (ref >90)
Glucose, Bld: 99 mg/dL (ref 70–99)

## 2012-02-08 LAB — PREPARE RBC (CROSSMATCH)

## 2012-02-08 MED ORDER — FUROSEMIDE 10 MG/ML IJ SOLN
10.0000 mg | Freq: Once | INTRAMUSCULAR | Status: AC
  Administered 2012-02-08: 10 mg via INTRAVENOUS
  Filled 2012-02-08 (×2): qty 1

## 2012-02-08 NOTE — Progress Notes (Signed)
Slow but steady improvement. Still with pain in the right hip. 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 No fractures noted.  His hemoglobin is coming up. We'll see what his reticulocyte count is. One has to believe that the sickling is improving and slowing down.  We can probably just transfuse back one unit now.  There is no dyspnea. Oxygen saturation is stable. Vital signs all look good. He is needing better.  He continues on the Lovenox. I'll keep him on this for now.  His low-grade temperature today. So far, there's been no source of any infection. He is off antibiotics.  We need to try to increases out of bed activity. Hopefully he can be sent to the floor today or over the weekend.  Again, his crises take a good 7-10 days to improve.  Once he is out of the ICU, then I would get him back on his oral regimen for his chronic down sickle cell pain.  On his physical exam this is a well-developed well-nourished black gentleman in no obvious distress. His vital signs show temperature of 100.1 pulse 79 respiratory 16 blood pressure 119/70. Oxygen saturation is 97%. His head exam shows no ocular oral lesion. There still may be some slight scleral icterus. Lungs are with good breath sounds bilaterally. Cardiac exam regular rate and rhythm with a normal S1-S2. There are no murmurs rubs or bruits. Abdominal exam soft. He has good bowel sounds. Extremities do show some tenderness in the right lateral hip to palpation. There is no tenderness over the long bones.  Again, exchanges we'll continue to help. I probably would exchange over the weekend. We can just use one unit of blood transfused back. At that point, we should have sufficiently diluted out his sickle cells.  I will continue the Lovenox through the weekend.  I very much appreciate all the great helped by the hospitalist and the ICU staff.  [name] E.    2 Cor 12:10

## 2012-02-08 NOTE — Progress Notes (Signed)
Subjective: Reports he is "hanging in there." Continues to improve daily. No other specific complaints.   Objective: Vital signs in last 24 hours: Filed Vitals:   02/08/12 0300 02/08/12 0400 02/08/12 0500 02/08/12 0600  BP: 124/60 124/70 125/84 122/72  Pulse: 81 92 86 80  Temp: 98.9 F (37.2 C) 100.1 F (37.8 C)    TempSrc: Oral Oral    Resp: 18 20 18 18   Height:      Weight:   74.7 kg (164 lb 10.9 oz)   SpO2:  97% 97% 99%   Weight change: 1 kg (2 lb 3.3 oz)  Intake/Output Summary (Last 24 hours) at 02/08/12 0747 Last data filed at 02/08/12 0600  Gross per 24 hour  Intake 1637.5 ml  Output   2050 ml  Net -412.5 ml    Physical Exam: General: Awake, Oriented, No acute distress. HEENT: EOMI. Neck: Supple CV: S1 and S2 Lungs: Clear to ascultation bilaterally Abdomen: Soft, Nontender, Nondistended, +bowel sounds. Ext: Good pulses. Trace edema.  Lab Results: Basic Metabolic Panel:  Lab 02/08/12 1610 02/07/12 0900 02/06/12 0515 02/05/12 0330 02/04/12 1640 02/04/12 1528  NA 139 139 140 138 -- 138  K 3.8 3.7 4.2 4.6 -- 4.3  CL 105 106 109 106 -- 106  CO2 28 28 25 23  -- 23  GLUCOSE 99 87 83 122* -- 97  BUN 8 7 8 14  -- 12  CREATININE 0.76 0.68 0.82 1.13 -- 1.15  CALCIUM 8.8 8.2* 8.6 8.4 -- 8.1*  MG -- -- -- 2.2 1.7 --  PHOS -- -- -- -- -- --   Liver Function Tests:  Lab 02/05/12 0330 02/04/12 1100  AST 102* 123*  ALT 36 42  ALKPHOS 86 105  BILITOT 2.8* 4.4*  PROT 6.4 7.3  ALBUMIN 3.6 4.5   No results found for this basename: LIPASE:5,AMYLASE:5 in the last 168 hours  Lab 02/04/12 1640  AMMONIA 65*   CBC:  Lab 02/08/12 0425 02/07/12 0900 02/06/12 0515 02/05/12 0330 02/04/12 1528 02/04/12 1100  WBC 10.1 9.3 13.3* 15.1* 16.3* --  NEUTROABS -- -- -- 10.9* -- 10.6*  HGB 11.8* 10.5* 9.9* 9.0* 8.6* --  HCT 33.2* 28.6* 27.1* 23.5* 22.5* --  MCV 86.2 85.1 82.9 81.6 81.8 --  PLT 170 172 213 226 229 --   Cardiac Enzymes:  Lab 02/05/12 0201 02/04/12 2008  02/04/12 1310  CKTOTAL -- -- --  CKMB -- -- --  CKMBINDEX -- -- --  TROPONINI <0.30 <0.30 <0.30   BNP (last 3 results)  Basename 02/04/12 1641  PROBNP 158.2*   CBG: No results found for this basename: GLUCAP:5 in the last 168 hours  Basename 02/07/12 0900  HGBA1C 4.3   Other Labs: No components found with this basename: POCBNP:3  Lab 02/04/12 1327  DDIMER 7.80*   No results found for this basename: CHOL:2,HDL:2,LDLCALC:2,TRIG:2,CHOLHDL:2,LDLDIRECT:2 in the last 168 hours No results found for this basename: TSH,T4TOTAL,FREET3,T3FREE,FREET4,THYROIDAB in the last 168 hours  Lab 02/05/12 0330 02/04/12 1100  VITAMINB12 -- --  FOLATE -- --  FERRITIN -- --  TIBC -- --  IRON -- --  RETICCTPCT 26.0* 24.8*    Micro Results: Recent Results (from the past 240 hour(s))  CULTURE, BLOOD (ROUTINE X 2)     Status: Normal (Preliminary result)   Collection Time   02/04/12  1:27 PM      Component Value Range Status Comment   Specimen Description BLOOD RIGHT ARM   Final    Special Requests BOTTLES DRAWN  AEROBIC AND ANAEROBIC 10CC   Final    Culture  Setup Time 02/04/2012 20:52   Final    Culture     Final    Value:        BLOOD CULTURE RECEIVED NO GROWTH TO DATE CULTURE WILL BE HELD FOR 5 DAYS BEFORE ISSUING A FINAL NEGATIVE REPORT   Report Status PENDING   Incomplete   CULTURE, BLOOD (ROUTINE X 2)     Status: Normal (Preliminary result)   Collection Time   02/04/12  1:29 PM      Component Value Range Status Comment   Specimen Description BLOOD RIGHT HAND   Final    Special Requests BOTTLES DRAWN AEROBIC AND ANAEROBIC 10CC   Final    Culture  Setup Time 02/04/2012 20:52   Final    Culture     Final    Value:        BLOOD CULTURE RECEIVED NO GROWTH TO DATE CULTURE WILL BE HELD FOR 5 DAYS BEFORE ISSUING A FINAL NEGATIVE REPORT   Report Status PENDING   Incomplete   MRSA PCR SCREENING     Status: Abnormal   Collection Time   02/04/12  4:19 PM      Component Value Range Status  Comment   MRSA by PCR POSITIVE (*) NEGATIVE Final   URINE CULTURE     Status: Normal   Collection Time   02/04/12  6:58 PM      Component Value Range Status Comment   Specimen Description URINE, CLEAN CATCH   Final    Special Requests NONE   Final    Culture  Setup Time 02/05/2012 02:18   Final    Colony Count NO GROWTH   Final    Culture NO GROWTH   Final    Report Status 02/05/2012 FINAL   Final   CULTURE, EXPECTORATED SPUTUM-ASSESSMENT     Status: Normal   Collection Time   02/04/12  7:00 PM      Component Value Range Status Comment   Specimen Description SPUTUM   Final    Special Requests NONE   Final    Sputum evaluation     Final    Value: THIS SPECIMEN IS ACCEPTABLE. RESPIRATORY CULTURE REPORT TO FOLLOW.   Report Status 02/04/2012 FINAL   Final   CULTURE, RESPIRATORY     Status: Normal   Collection Time   02/04/12  7:00 PM      Component Value Range Status Comment   Specimen Description SPUTUM   Final    Special Requests NONE   Final    Gram Stain     Final    Value: NO WBC SEEN     FEW SQUAMOUS EPITHELIAL CELLS PRESENT     MODERATE GRAM POSITIVE COCCI IN CLUSTERS     IN PAIRS MODERATE GRAM NEGATIVE RODS     FEW GRAM POSITIVE RODS   Culture     Final    Value: NORMAL OROPHARYNGEAL FLORA     Note: MICROSCOPIC FINDINGS SUGGEST THAT THIS SPECIMEN IS NOT REPRESENTATIVE OF LOWER RESPIRATORY SECRETIONS. PLEASE RECOLLECT.   Report Status 02/07/2012 FINAL   Final     Studies/Results: No results found.  Medications: I have reviewed the patient's current medications. Scheduled Meds:    . antiseptic oral rinse  15 mL Mouth Rinse BID  . aprepitant  80 mg Oral Daily  . Chlorhexidine Gluconate Cloth  6 each Topical Q0600  . docusate sodium  100 mg Oral  BID  . enoxaparin (LOVENOX) injection  70 mg Subcutaneous Q12H  . folic acid  2 mg Oral Daily  . furosemide  10 mg Intravenous Once  . hydroxyurea  1,000 mg Oral Daily  . ketorolac  15 mg Intravenous Q8H  . multivitamin  with minerals  1 tablet Oral Daily  . mupirocin ointment  1 application Nasal BID  . pantoprazole  40 mg Oral Daily  . sodium chloride  10-40 mL Intracatheter Q12H  . DISCONTD: levofloxacin (LEVAQUIN) IV  750 mg Intravenous Q24H   Continuous Infusions:    . sodium chloride 50 mL/hr at 02/07/12 0900   PRN Meds:.acetaminophen, acetaminophen, bisacodyl, diphenhydrAMINE, HYDROmorphone (DILAUDID) injection, hydrOXYzine, LORazepam, LORazepam, ondansetron (ZOFRAN) IV, ondansetron, polyethylene glycol, sodium chloride  Assessment/Plan: Acute respiratory failure Likely secondary to pain medications, resolved with decreasing his pain medications. Patient however does not look in respiratory distress. Patient on oxygen. CT angiogram of the chest was negative for PE. Chest x-ray with some appearance of a vascular congestion. Will need to monitor closely for development of ARDS. Patient is to receive daily exchange transfusion.   Acute sickle cell crisis  Likely triggered by respiratory infection, no source of infection found. Patient still with significant pain and itching all over, seems improved today. CT angiogram of the chest was negative for pulmonary embolus. Per hematology to continue on empiric Lovenox for a sickle cell crisis. Continue IV pain medication. Continue hydroxyzine and benadryl as needed for itching. Patient has been started on the Emend per hematology for itching. Continue hydration and folic acid. Patient had exchange transfusion with 2 units on 02/05/2012, 02/06/2012, 02/07/2012 and another exchange transfusion is planned for today. Over the weekend patient is to receive exchange transfusion with 1 unit of pRBC. Hematology, Dr. Myna Hidalgo is following and appreciate input and recommendations.   Acute delirium/Altered mental status/Acue Encephalopathy Likely due to sickle cell crisis and pain medications. Resolved.  Leukocytosis  Unclear etiology, likely due to acute sickle cell  crisis. Patient was recently treated with a Z-Pak. Sputum Gram stain negative. Blood and urine cultures shows no growth to date. Chest x-ray is negative for acute infiltrate. Empiric IV Levaquin 02/04/2012-02/07/2012. No sources of infection found. Blood and urine cultures show no growth to date.  Dehydration  IV fluids.  Anemia  Secondary to sickle cell crisis. Patient to undergo another exchange transfusion today.  Prophylaxis  PPI for GI prophylaxis. Lovenox for DVT prophylaxis.   Code Status: Full  Family Communication: Updated patient at bedside  Disposition Plan: Home when medically stable, transfer to med-surg today. No events on telemetry for the last 72 hours.   Consultants:  Critical care medicine: Dr. Sherene Sires 02/04/2012, signed off. Hematology oncology: Dr. Myna Hidalgo 02/04/2012  Procedures:  CXR 02/04/12, 02/05/12  Plain films of the right hip 02/04/2012  Plain films of the L-spine 02/04/2012  CT of the chest 02/04/2012  PICC line 02/05/2012  Antibiotics:  IV Levaquin 02/04/12-02/07/2012.   LOS: 4 days  Prisila Dlouhy A, MD 02/08/2012, 7:47 AM

## 2012-02-08 NOTE — Progress Notes (Signed)
Blood exchange ordered - withdrawn and to transfuse one unit. Done over 35 minutes without difficukty. RN aware VSandritter RN/[facility]

## 2012-02-09 LAB — TYPE AND SCREEN
ABO/RH(D): O POS
DAT, IgG: NEGATIVE
Unit division: 0
Unit division: 0
Unit division: 0
Unit division: 0
Unit division: 0

## 2012-02-09 LAB — CBC
MCH: 30.1 pg (ref 26.0–34.0)
MCHC: 35.4 g/dL (ref 30.0–36.0)
MCV: 85.1 fL (ref 78.0–100.0)
Platelets: 182 10*3/uL (ref 150–400)
RDW: 20.4 % — ABNORMAL HIGH (ref 11.5–15.5)

## 2012-02-09 LAB — BASIC METABOLIC PANEL
CO2: 30 mEq/L (ref 19–32)
Calcium: 8.7 mg/dL (ref 8.4–10.5)
Creatinine, Ser: 0.74 mg/dL (ref 0.50–1.35)
GFR calc Af Amer: >90 mL/min (ref >90)
GFR calc non Af Amer: >90 mL/min (ref >90)

## 2012-02-09 MED ORDER — POTASSIUM CHLORIDE CRYS ER 20 MEQ PO TBCR
20.0000 meq | EXTENDED_RELEASE_TABLET | Freq: Once | ORAL | Status: AC
  Administered 2012-02-09: 20 meq via ORAL
  Filled 2012-02-09: qty 1

## 2012-02-09 NOTE — Progress Notes (Addendum)
Subjective: Still having pain, which is improved. No specific concerns.  Objective: Vital signs in last 24 hours: Filed Vitals:   02/08/12 1715 02/08/12 2214 02/09/12 0158 02/09/12 0602  BP: 128/88 131/79 127/69 137/88  Pulse: 78 83 80 68  Temp: 98.4 F (36.9 C) 99.6 F (37.6 C) 98.9 F (37.2 C) 98.7 F (37.1 C)  TempSrc: Oral Oral Oral Oral  Resp: 16 18 18 16   Height:      Weight:    70.2 kg (154 lb 12.2 oz)  SpO2:  93% 91% 94%   Weight change: -4.5 kg (-9 lb 14.7 oz)  Intake/Output Summary (Last 24 hours) at 02/09/12 0804 Last data filed at 02/09/12 0500  Gross per 24 hour  Intake    660 ml  Output   2725 ml  Net  -2065 ml    Physical Exam: General: Awake, Oriented, No acute distress. HEENT: EOMI. Neck: Supple CV: S1 and S2 Lungs: Clear to ascultation bilaterally Abdomen: Soft, Nontender, Nondistended, +bowel sounds. Ext: Good pulses. Trace edema.  Lab Results: Basic Metabolic Panel:  Lab 02/09/12 1610 02/08/12 0425 02/07/12 0900 02/06/12 0515 02/05/12 0330 02/04/12 1640  NA 138 139 139 140 138 --  K 3.4* 3.8 3.7 4.2 4.6 --  CL 102 105 106 109 106 --  CO2 30 28 28 25 23  --  GLUCOSE 94 99 87 83 122* --  BUN 9 8 7 8 14  --  CREATININE 0.74 0.76 0.68 0.82 1.13 --  CALCIUM 8.7 8.8 8.2* 8.6 8.4 --  MG -- -- -- -- 2.2 1.7  PHOS -- -- -- -- -- --   Liver Function Tests:  Lab 02/05/12 0330 02/04/12 1100  AST 102* 123*  ALT 36 42  ALKPHOS 86 105  BILITOT 2.8* 4.4*  PROT 6.4 7.3  ALBUMIN 3.6 4.5   No results found for this basename: LIPASE:5,AMYLASE:5 in the last 168 hours  Lab 02/04/12 1640  AMMONIA 65*   CBC:  Lab 02/09/12 0426 02/08/12 0425 02/07/12 0900 02/06/12 0515 02/05/12 0330 02/04/12 1100  WBC 10.2 10.1 9.3 13.3* 15.1* --  NEUTROABS -- -- -- -- 10.9* 10.6*  HGB 12.5* 11.8* 10.5* 9.9* 9.0* --  HCT 35.3* 33.2* 28.6* 27.1* 23.5* --  MCV 85.1 86.2 85.1 82.9 81.6 --  PLT 182 170 172 213 226 --   Cardiac Enzymes:  Lab 02/05/12 0201 02/04/12  2008 02/04/12 1310  CKTOTAL -- -- --  CKMB -- -- --  CKMBINDEX -- -- --  TROPONINI <0.30 <0.30 <0.30   BNP (last 3 results)  Basename 02/04/12 1641  PROBNP 158.2*   CBG: No results found for this basename: GLUCAP:5 in the last 168 hours  Basename 02/07/12 0900  HGBA1C 4.3   Other Labs: No components found with this basename: POCBNP:3  Lab 02/04/12 1327  DDIMER 7.80*   No results found for this basename: CHOL:2,HDL:2,LDLCALC:2,TRIG:2,CHOLHDL:2,LDLDIRECT:2 in the last 168 hours No results found for this basename: TSH,T4TOTAL,FREET3,T3FREE,FREET4,THYROIDAB in the last 168 hours  Lab 02/05/12 0330 02/04/12 1100  VITAMINB12 -- --  FOLATE -- --  FERRITIN -- --  TIBC -- --  IRON -- --  RETICCTPCT 26.0* 24.8*    Micro Results: Recent Results (from the past 240 hour(s))  CULTURE, BLOOD (ROUTINE X 2)     Status: Normal (Preliminary result)   Collection Time   02/04/12  1:27 PM      Component Value Range Status Comment   Specimen Description BLOOD RIGHT ARM   Final  Special Requests BOTTLES DRAWN AEROBIC AND ANAEROBIC 10CC   Final    Culture  Setup Time 02/04/2012 20:52   Final    Culture     Final    Value:        BLOOD CULTURE RECEIVED NO GROWTH TO DATE CULTURE WILL BE HELD FOR 5 DAYS BEFORE ISSUING A FINAL NEGATIVE REPORT   Report Status PENDING   Incomplete   CULTURE, BLOOD (ROUTINE X 2)     Status: Normal (Preliminary result)   Collection Time   02/04/12  1:29 PM      Component Value Range Status Comment   Specimen Description BLOOD RIGHT HAND   Final    Special Requests BOTTLES DRAWN AEROBIC AND ANAEROBIC 10CC   Final    Culture  Setup Time 02/04/2012 20:52   Final    Culture     Final    Value:        BLOOD CULTURE RECEIVED NO GROWTH TO DATE CULTURE WILL BE HELD FOR 5 DAYS BEFORE ISSUING A FINAL NEGATIVE REPORT   Report Status PENDING   Incomplete   MRSA PCR SCREENING     Status: Abnormal   Collection Time   02/04/12  4:19 PM      Component Value Range Status  Comment   MRSA by PCR POSITIVE (*) NEGATIVE Final   URINE CULTURE     Status: Normal   Collection Time   02/04/12  6:58 PM      Component Value Range Status Comment   Specimen Description URINE, CLEAN CATCH   Final    Special Requests NONE   Final    Culture  Setup Time 02/05/2012 02:18   Final    Colony Count NO GROWTH   Final    Culture NO GROWTH   Final    Report Status 02/05/2012 FINAL   Final   CULTURE, EXPECTORATED SPUTUM-ASSESSMENT     Status: Normal   Collection Time   02/04/12  7:00 PM      Component Value Range Status Comment   Specimen Description SPUTUM   Final    Special Requests NONE   Final    Sputum evaluation     Final    Value: THIS SPECIMEN IS ACCEPTABLE. RESPIRATORY CULTURE REPORT TO FOLLOW.   Report Status 02/04/2012 FINAL   Final   CULTURE, RESPIRATORY     Status: Normal   Collection Time   02/04/12  7:00 PM      Component Value Range Status Comment   Specimen Description SPUTUM   Final    Special Requests NONE   Final    Gram Stain     Final    Value: NO WBC SEEN     FEW SQUAMOUS EPITHELIAL CELLS PRESENT     MODERATE GRAM POSITIVE COCCI IN CLUSTERS     IN PAIRS MODERATE GRAM NEGATIVE RODS     FEW GRAM POSITIVE RODS   Culture     Final    Value: NORMAL OROPHARYNGEAL FLORA     Note: MICROSCOPIC FINDINGS SUGGEST THAT THIS SPECIMEN IS NOT REPRESENTATIVE OF LOWER RESPIRATORY SECRETIONS. PLEASE RECOLLECT.   Report Status 02/07/2012 FINAL   Final     Studies/Results: No results found.  Medications: I have reviewed the patient's current medications. Scheduled Meds:    . antiseptic oral rinse  15 mL Mouth Rinse BID  . Chlorhexidine Gluconate Cloth  6 each Topical Q0600  . docusate sodium  100 mg Oral BID  . enoxaparin (  LOVENOX) injection  70 mg Subcutaneous Q12H  . folic acid  2 mg Oral Daily  . furosemide  10 mg Intravenous Once  . hydroxyurea  1,000 mg Oral Daily  . multivitamin with minerals  1 tablet Oral Daily  . mupirocin ointment  1  application Nasal BID  . pantoprazole  40 mg Oral Daily  . sodium chloride  10-40 mL Intracatheter Q12H   Continuous Infusions:    . sodium chloride 50 mL/hr (02/09/12 0459)   PRN Meds:.acetaminophen, acetaminophen, bisacodyl, diphenhydrAMINE, HYDROmorphone (DILAUDID) injection, hydrOXYzine, LORazepam, LORazepam, ondansetron (ZOFRAN) IV, ondansetron, polyethylene glycol, sodium chloride  Assessment/Plan: Acute respiratory failure Likely secondary to pain medications, resolved with decreasing his pain medications. Patient on oxygen. CT angiogram of the chest was negative for PE. Chest x-ray with some appearance of a vascular congestion. Patient is to receive daily exchange transfusion.   Acute sickle cell crisis  No source of infection found. Patient still with significant pain and itching all over, continues to improve since admission.  CT angiogram of the chest was negative for pulmonary embolus. Per hematology to continue on empiric Lovenox for a sickle cell crisis. Continue IV pain medication. Continue hydroxyzine and benadryl as needed for itching. Patient has been started on the Emend per hematology for itching. Continue hydration and folic acid. Patient had exchange transfusion with on 02/05/2012, 02/06/2012, 02/07/2012, and 02/08/2012. Over the weekend patient is to receive exchange transfusion with 1 unit of pRBC. Hematology, Dr. Myna Hidalgo is following and appreciate input and recommendations.   Acute delirium/Altered mental status/Acue Encephalopathy Likely due to sickle cell crisis and pain medications. Resolved.  Leukocytosis  Unclear etiology, likely due to acute sickle cell crisis. Patient was recently treated with a Z-Pak. Sputum Gram stain negative. Blood and urine cultures shows no growth to date. Chest x-ray is negative for acute infiltrate. Empiric IV Levaquin 02/04/2012-02/07/2012. No sources of infection found. Blood and urine cultures show no growth to date.  Dehydration    IV fluids.  Anemia  Secondary to sickle cell crisis. S/p daily exchange transfusions. Hemoglobin stable.   Hypokalemia Replace as needed.  Prophylaxis  PPI for GI prophylaxis. Lovenox for DVT prophylaxis.   Code Status: Full  Family Communication: Updated patient at bedside  Disposition Plan: Home when medically stable, continue in med-surg.   Consultants:  Critical care medicine: Dr. Sherene Sires 02/04/2012, signed off. Hematology oncology: Dr. Myna Hidalgo 02/04/2012  Procedures:  CXR 02/04/12, 02/05/12  Plain films of the right hip 02/04/2012  Plain films of the L-spine 02/04/2012  CT of the chest 02/04/2012  PICC line 02/05/2012  Antibiotics:  IV Levaquin 02/04/12-02/07/2012.   LOS: 5 days  Helia Haese A, MD 02/09/2012, 8:04 AM

## 2012-02-09 NOTE — Plan of Care (Signed)
Problem: Phase II Progression Outcomes Goal: Pain at, < goal with appropriate interventions Outcome: Not Met (add Reason) Continue to be have severe pain

## 2012-02-09 NOTE — ED Provider Notes (Signed)
Medical screening examination/treatment/procedure(s) were conducted as a shared visit with non-physician practitioner(s) and myself.  I personally evaluated the patient during the encounter Pt c/o sickle cell pain, all over. Denies fever/chills. No cough or cp. Chest cta. No focal rales/consolidation. Cxr. Labs. Iv ns. O2. Pain control. Admit.   Suzi Roots, MD 02/09/12 [phone]

## 2012-02-09 NOTE — Progress Notes (Signed)
[name]   DOB:1981-02-18   AV#:[id number]   [phone]  Subjective: 31 yo with SS dz, admitted with crisis episode, and some degree of respiratory distress. Has received repeated prbc transfusions. Feeling better , but pain still an issue. On parenteral pain meds. Has not started po regimen.   Objective:  Filed Vitals:   02/09/12 0602  BP: 137/88  Pulse: 68  Temp: 98.7 F (37.1 C)  Resp: 16    Body mass index is 24.98 kg/(m^2).  Intake/Output Summary (Last 24 hours) at 02/09/12 0945 Last data filed at 02/09/12 0500  Gross per 24 hour  Intake    610 ml  Output   2725 ml  Net  -2115 ml   Alert and orientated x 3, in  Bed eating bkfst.  Sclerae unicteric  Oropharynx clear  No peripheral adenopathy  Lungs clear -- no rales or rhonchi  Heart regular rate and rhythm  Abdomen benign  MSK no focal spinal tenderness, no peripheral edema  Neuro nonfocal    CBG (last 3)  No results found for this basename: GLUCAP:3 in the last 72 hours   Labs:  Lab Results  Component Value Date   WBC 10.2 02/09/2012   HGB 12.5* 02/09/2012   HCT 35.3* 02/09/2012   MCV 85.1 02/09/2012   PLT 182 02/09/2012   NEUTROABS 10.9* 02/05/2012    @LASTCHEMISTRY @  Urine Studies No results found for this basename: UACOL:2,UAPR:2,USPG:2,UPH:2,UTP:2,UGL:2,UKET:2,UBIL:2,UHGB:2,UNIT:2,UROB:2,ULEU:2,UEPI:2,UWBC:2,URBC:2,UBAC:2,CAST:2,CRYS:2,UCOM:2,BILUA:2 in the last 72 hours  Basic Metabolic Panel:  Lab 02/09/12 5784 02/08/12 0425 02/07/12 0900 02/06/12 0515 02/05/12 0330 02/04/12 1640  NA 138 139 139 140 138 --  K 3.4* 3.8 -- -- -- --  CL 102 105 106 109 106 --  CO2 30 28 28 25 23  --  GLUCOSE 94 99 87 83 122* --  BUN 9 8 7 8 14  --  CREATININE 0.74 0.76 0.68 0.82 1.13 --  CALCIUM 8.7 8.8 8.2* 8.6 8.4 --  MG -- -- -- -- 2.2 1.7  PHOS -- -- -- -- -- --   GFR Estimated Creatinine Clearance: 119.6 ml/min (by C-G formula based on Cr of 0.74). Liver Function Tests:  Lab 02/05/12 0330 02/04/12  1100  AST 102* 123*  ALT 36 42  ALKPHOS 86 105  BILITOT 2.8* 4.4*  PROT 6.4 7.3  ALBUMIN 3.6 4.5   No results found for this basename: LIPASE:5,AMYLASE:5 in the last 168 hours  Lab 02/04/12 1640  AMMONIA 65*   Coagulation profile No results found for this basename: INR:5,PROTIME:5 in the last 168 hours  CBC:  Lab 02/09/12 0426 02/08/12 0425 02/07/12 0900 02/06/12 0515 02/05/12 0330 02/04/12 1100  WBC 10.2 10.1 9.3 13.3* 15.1* --  NEUTROABS -- -- -- -- 10.9* 10.6*  HGB 12.5* 11.8* 10.5* 9.9* 9.0* --  HCT 35.3* 33.2* 28.6* 27.1* 23.5* --  MCV 85.1 86.2 85.1 82.9 81.6 --  PLT 182 170 172 213 226 --   Cardiac Enzymes:  Lab 02/05/12 0201 02/04/12 2008 02/04/12 1310  CKTOTAL -- -- --  CKMB -- -- --  CKMBINDEX -- -- --  TROPONINI <0.30 <0.30 <0.30   BNP: No components found with this basename: POCBNP:5 CBG: No results found for this basename: GLUCAP:5 in the last 168 hours D-Dimer No results found for this basename: DDIMER:2 in the last 72 hours Hgb A1c  Basename 02/07/12 0900  HGBA1C 4.3   Lipid Profile No results found for this basename: CHOL:2,HDL:2,LDLCALC:2,TRIG:2,CHOLHDL:2,LDLDIRECT:2 in the last 72 hours Thyroid function studies No results found  for this basename: TSH,T4TOTAL,FREET3,T3FREE,THYROIDAB in the last 72 hours Anemia work up No results found for this basename: VITAMINB12:2,FOLATE:2,FERRITIN:2,TIBC:2,IRON:2,RETICCTPCT:2 in the last 72 hours Microbiology Recent Results (from the past 240 hour(s))  CULTURE, BLOOD (ROUTINE X 2)     Status: Normal (Preliminary result)   Collection Time   02/04/12  1:27 PM      Component Value Range Status Comment   Specimen Description BLOOD RIGHT ARM   Final    Special Requests BOTTLES DRAWN AEROBIC AND ANAEROBIC 10CC   Final    Culture  Setup Time 02/04/2012 20:52   Final    Culture     Final    Value:        BLOOD CULTURE RECEIVED NO GROWTH TO DATE CULTURE WILL BE HELD FOR 5 DAYS BEFORE ISSUING A FINAL NEGATIVE  REPORT   Report Status PENDING   Incomplete   CULTURE, BLOOD (ROUTINE X 2)     Status: Normal (Preliminary result)   Collection Time   02/04/12  1:29 PM      Component Value Range Status Comment   Specimen Description BLOOD RIGHT HAND   Final    Special Requests BOTTLES DRAWN AEROBIC AND ANAEROBIC 10CC   Final    Culture  Setup Time 02/04/2012 20:52   Final    Culture     Final    Value:        BLOOD CULTURE RECEIVED NO GROWTH TO DATE CULTURE WILL BE HELD FOR 5 DAYS BEFORE ISSUING A FINAL NEGATIVE REPORT   Report Status PENDING   Incomplete   MRSA PCR SCREENING     Status: Abnormal   Collection Time   02/04/12  4:19 PM      Component Value Range Status Comment   MRSA by PCR POSITIVE (*) NEGATIVE Final   URINE CULTURE     Status: Normal   Collection Time   02/04/12  6:58 PM      Component Value Range Status Comment   Specimen Description URINE, CLEAN CATCH   Final    Special Requests NONE   Final    Culture  Setup Time 02/05/2012 02:18   Final    Colony Count NO GROWTH   Final    Culture NO GROWTH   Final    Report Status 02/05/2012 FINAL   Final   CULTURE, EXPECTORATED SPUTUM-ASSESSMENT     Status: Normal   Collection Time   02/04/12  7:00 PM      Component Value Range Status Comment   Specimen Description SPUTUM   Final    Special Requests NONE   Final    Sputum evaluation     Final    Value: THIS SPECIMEN IS ACCEPTABLE. RESPIRATORY CULTURE REPORT TO FOLLOW.   Report Status 02/04/2012 FINAL   Final   CULTURE, RESPIRATORY     Status: Normal   Collection Time   02/04/12  7:00 PM      Component Value Range Status Comment   Specimen Description SPUTUM   Final    Special Requests NONE   Final    Gram Stain     Final    Value: NO WBC SEEN     FEW SQUAMOUS EPITHELIAL CELLS PRESENT     MODERATE GRAM POSITIVE COCCI IN CLUSTERS     IN PAIRS MODERATE GRAM NEGATIVE RODS     FEW GRAM POSITIVE RODS   Culture     Final    Value: NORMAL OROPHARYNGEAL FLORA  Note: MICROSCOPIC  FINDINGS SUGGEST THAT THIS SPECIMEN IS NOT REPRESENTATIVE OF LOWER RESPIRATORY SECRETIONS. PLEASE RECOLLECT.   Report Status 02/07/2012 FINAL   Final       Studies:  No results found.  Assessment: 31 y.o. Hx ss dz with painful crisis, gradually improving, s/p multiple small exchange transfusions with improvement.  Plan: continue present regimen, Hg has normalized. Would begin po pain meds.   Gianni Fuchs 02/09/2012

## 2012-02-10 LAB — CULTURE, BLOOD (ROUTINE X 2): Culture: NO GROWTH

## 2012-02-10 LAB — BASIC METABOLIC PANEL
BUN: 10 mg/dL (ref 6–23)
Calcium: 9 mg/dL (ref 8.4–10.5)
Chloride: 103 mEq/L (ref 96–112)
Creatinine, Ser: 0.74 mg/dL (ref 0.50–1.35)
GFR calc Af Amer: >90 mL/min (ref >90)
GFR calc non Af Amer: >90 mL/min (ref >90)

## 2012-02-10 LAB — CBC
HCT: 35.5 % — ABNORMAL LOW (ref 39.0–52.0)
MCH: 30.3 pg (ref 26.0–34.0)
MCHC: 35.2 g/dL (ref 30.0–36.0)
MCV: 86 fL (ref 78.0–100.0)
RDW: 19.4 % — ABNORMAL HIGH (ref 11.5–15.5)

## 2012-02-10 LAB — PREPARE RBC (CROSSMATCH)

## 2012-02-10 LAB — RETICULOCYTES: Retic Ct Pct: 10.9 % — ABNORMAL HIGH (ref 0.4–3.1)

## 2012-02-10 MED ORDER — OXYCODONE HCL ER 40 MG PO T12A
80.0000 mg | EXTENDED_RELEASE_TABLET | Freq: Two times a day (BID) | ORAL | Status: DC
  Administered 2012-02-10 – 2012-02-15 (×11): 80 mg via ORAL
  Filled 2012-02-10 (×11): qty 2

## 2012-02-10 NOTE — Progress Notes (Signed)
Subjective: Still having pain. No other complaints.  Objective: Vital signs in last 24 hours: Filed Vitals:   02/09/12 1750 02/09/12 2120 02/10/12 0155 02/10/12 0505  BP: 134/84 140/81 127/78 131/92  Pulse: 79 74 70 67  Temp: 98.6 F (37 C) 99.3 F (37.4 C) 98.6 F (37 C) 98.2 F (36.8 C)  TempSrc: Oral Oral Oral Oral  Resp: 18 18 18 16   Height:      Weight:    68.9 kg (151 lb 14.4 oz)  SpO2: 92% 96% 95% 93%   Weight change: -1.3 kg (-2 lb 13.9 oz)  Intake/Output Summary (Last 24 hours) at 02/10/12 0831 Last data filed at 02/10/12 0700  Gross per 24 hour  Intake 3369.99 ml  Output   3150 ml  Net 219.99 ml    Physical Exam: General: Awake, Oriented, No acute distress. HEENT: EOMI. Neck: Supple CV: S1 and S2 Lungs: Clear to ascultation bilaterally Abdomen: Soft, Nontender, Nondistended, +bowel sounds. Ext: Good pulses. Trace edema.  Lab Results: Basic Metabolic Panel:  Lab 02/10/12 4782 02/09/12 0426 02/08/12 0425 02/07/12 0900 02/06/12 0515 02/05/12 0330 02/04/12 1640  NA 137 138 139 139 140 -- --  K 3.8 3.4* 3.8 3.7 4.2 -- --  CL 103 102 105 106 109 -- --  CO2 28 30 28 28 25  -- --  GLUCOSE 92 94 99 87 83 -- --  BUN 10 9 8 7 8  -- --  CREATININE 0.74 0.74 0.76 0.68 0.82 -- --  CALCIUM 9.0 8.7 8.8 8.2* 8.6 -- --  MG -- -- -- -- -- 2.2 1.7  PHOS -- -- -- -- -- -- --   Liver Function Tests:  Lab 02/05/12 0330 02/04/12 1100  AST 102* 123*  ALT 36 42  ALKPHOS 86 105  BILITOT 2.8* 4.4*  PROT 6.4 7.3  ALBUMIN 3.6 4.5   No results found for this basename: LIPASE:5,AMYLASE:5 in the last 168 hours  Lab 02/04/12 1640  AMMONIA 65*   CBC:  Lab 02/10/12 0630 02/09/12 0426 02/08/12 0425 02/07/12 0900 02/06/12 0515 02/05/12 0330 02/04/12 1100  WBC 9.7 10.2 10.1 9.3 13.3* -- --  NEUTROABS -- -- -- -- -- 10.9* 10.6*  HGB 12.5* 12.5* 11.8* 10.5* 9.9* -- --  HCT 35.5* 35.3* 33.2* 28.6* 27.1* -- --  MCV 86.0 85.1 86.2 85.1 82.9 -- --  PLT 187 182 170 172 213 --  --   Cardiac Enzymes:  Lab 02/05/12 0201 02/04/12 2008 02/04/12 1310  CKTOTAL -- -- --  CKMB -- -- --  CKMBINDEX -- -- --  TROPONINI <0.30 <0.30 <0.30   BNP (last 3 results)  Basename 02/04/12 1641  PROBNP 158.2*   CBG: No results found for this basename: GLUCAP:5 in the last 168 hours  Basename 02/07/12 0900  HGBA1C 4.3   Other Labs: No components found with this basename: POCBNP:3  Lab 02/04/12 1327  DDIMER 7.80*   No results found for this basename: CHOL:2,HDL:2,LDLCALC:2,TRIG:2,CHOLHDL:2,LDLDIRECT:2 in the last 168 hours No results found for this basename: TSH,T4TOTAL,FREET3,T3FREE,FREET4,THYROIDAB in the last 168 hours  Lab 02/10/12 0630 02/05/12 0330  VITAMINB12 -- --  FOLATE -- --  FERRITIN -- --  TIBC -- --  IRON -- --  RETICCTPCT 10.9* 26.0*    Micro Results: Recent Results (from the past 240 hour(s))  CULTURE, BLOOD (ROUTINE X 2)     Status: Normal (Preliminary result)   Collection Time   02/04/12  1:27 PM      Component Value Range Status Comment  Specimen Description BLOOD RIGHT ARM   Final    Special Requests BOTTLES DRAWN AEROBIC AND ANAEROBIC 10CC   Final    Culture  Setup Time 02/04/2012 20:52   Final    Culture     Final    Value:        BLOOD CULTURE RECEIVED NO GROWTH TO DATE CULTURE WILL BE HELD FOR 5 DAYS BEFORE ISSUING A FINAL NEGATIVE REPORT   Report Status PENDING   Incomplete   CULTURE, BLOOD (ROUTINE X 2)     Status: Normal (Preliminary result)   Collection Time   02/04/12  1:29 PM      Component Value Range Status Comment   Specimen Description BLOOD RIGHT HAND   Final    Special Requests BOTTLES DRAWN AEROBIC AND ANAEROBIC 10CC   Final    Culture  Setup Time 02/04/2012 20:52   Final    Culture     Final    Value:        BLOOD CULTURE RECEIVED NO GROWTH TO DATE CULTURE WILL BE HELD FOR 5 DAYS BEFORE ISSUING A FINAL NEGATIVE REPORT   Report Status PENDING   Incomplete   MRSA PCR SCREENING     Status: Abnormal   Collection Time     02/04/12  4:19 PM      Component Value Range Status Comment   MRSA by PCR POSITIVE (*) NEGATIVE Final   URINE CULTURE     Status: Normal   Collection Time   02/04/12  6:58 PM      Component Value Range Status Comment   Specimen Description URINE, CLEAN CATCH   Final    Special Requests NONE   Final    Culture  Setup Time 02/05/2012 02:18   Final    Colony Count NO GROWTH   Final    Culture NO GROWTH   Final    Report Status 02/05/2012 FINAL   Final   CULTURE, EXPECTORATED SPUTUM-ASSESSMENT     Status: Normal   Collection Time   02/04/12  7:00 PM      Component Value Range Status Comment   Specimen Description SPUTUM   Final    Special Requests NONE   Final    Sputum evaluation     Final    Value: THIS SPECIMEN IS ACCEPTABLE. RESPIRATORY CULTURE REPORT TO FOLLOW.   Report Status 02/04/2012 FINAL   Final   CULTURE, RESPIRATORY     Status: Normal   Collection Time   02/04/12  7:00 PM      Component Value Range Status Comment   Specimen Description SPUTUM   Final    Special Requests NONE   Final    Gram Stain     Final    Value: NO WBC SEEN     FEW SQUAMOUS EPITHELIAL CELLS PRESENT     MODERATE GRAM POSITIVE COCCI IN CLUSTERS     IN PAIRS MODERATE GRAM NEGATIVE RODS     FEW GRAM POSITIVE RODS   Culture     Final    Value: NORMAL OROPHARYNGEAL FLORA     Note: MICROSCOPIC FINDINGS SUGGEST THAT THIS SPECIMEN IS NOT REPRESENTATIVE OF LOWER RESPIRATORY SECRETIONS. PLEASE RECOLLECT.   Report Status 02/07/2012 FINAL   Final     Studies/Results: No results found.  Medications: I have reviewed the patient's current medications. Scheduled Meds:    . antiseptic oral rinse  15 mL Mouth Rinse BID  . [EXPIRED] Chlorhexidine Gluconate Cloth  6 each Topical  Q0600  . docusate sodium  100 mg Oral BID  . enoxaparin (LOVENOX) injection  70 mg Subcutaneous Q12H  . folic acid  2 mg Oral Daily  . hydroxyurea  1,000 mg Oral Daily  . multivitamin with minerals  1 tablet Oral Daily  .  [COMPLETED] mupirocin ointment  1 application Nasal BID  . OxyCODONE  80 mg Oral Q12H  . pantoprazole  40 mg Oral Daily  . [COMPLETED] potassium chloride  20 mEq Oral Once  . sodium chloride  10-40 mL Intracatheter Q12H   Continuous Infusions:    . sodium chloride 50 mL/hr at 02/09/12 2352   PRN Meds:.acetaminophen, acetaminophen, bisacodyl, diphenhydrAMINE, HYDROmorphone (DILAUDID) injection, hydrOXYzine, LORazepam, LORazepam, ondansetron (ZOFRAN) IV, ondansetron, polyethylene glycol, sodium chloride  Assessment/Plan: Acute respiratory failure Likely secondary to pain medications, resolved with decreasing his pain medications. CT angiogram of the chest was negative for PE. Chest x-ray with some appearance of a vascular congestion. Patient is to receive daily exchange transfusion as per hematology.  Acute sickle cell crisis  No source of infection found. Patient still with significant pain and itching all over, continues to improve since admission.  CT angiogram of the chest was negative for pulmonary embolus. Per hematology to continue on empiric Lovenox for a sickle cell crisis. Continue IV pain medication, started on home long acting oxycodone, patient was instructed that nursing staff may hold his dilaudid if there is any compromise in his respiratory status. Continue hydroxyzine and benadryl as needed for itching. Patient has been started on the Emend per hematology for itching. Continue hydration and folic acid. Patient had exchange transfusion with on 02/05/2012, 02/06/2012, 02/07/2012, and 02/08/2012. Over the weekend Dr. Myna Hidalgo planned for the patient to have exchange transfusion, but has not yet received, will defer hematology if they would like for the patient to receive exchange transfusion.   Acute delirium/Altered mental status/Acue Encephalopathy Likely due to sickle cell crisis and pain medications. Resolved.  Leukocytosis  Unclear etiology, likely due to acute sickle cell  crisis. Patient was recently treated with a Z-Pak. Sputum Gram stain negative. Blood and urine cultures shows no growth to date. Chest x-ray is negative for acute infiltrate. Empiric IV Levaquin 02/04/2012-02/07/2012. No sources of infection found. Blood and urine cultures show no growth to date.  Dehydration  Resolved with IV fluids.  Anemia  Secondary to sickle cell crisis. S/p daily exchange transfusions. Hemoglobin stable.   Hypokalemia Replace as needed.  Prophylaxis  PPI for GI prophylaxis. Lovenox for DVT prophylaxis.   Code Status: Full  Family Communication: Updated patient at bedside  Disposition Plan: Home when medically stable, continue in med-surg.   Consultants:  Critical care medicine: Dr. Sherene Sires 02/04/2012, signed off. Hematology oncology: Dr. Myna Hidalgo 02/04/2012  Procedures:  CXR 02/04/12, 02/05/12  Plain films of the right hip 02/04/2012  Plain films of the L-spine 02/04/2012  CT of the chest 02/04/2012  PICC line 02/05/2012  Antibiotics:  IV Levaquin 02/04/12-02/07/2012.   LOS: 6 days  Eadie Repetto A, MD 02/10/2012, 8:31 AM

## 2012-02-10 NOTE — Progress Notes (Signed)
MD, Please clarify if patient needs another exchange transfusion or is the transfusion order dated from the 1st just a duplicate.  Patient has received 4 days of transfusions from the 29-1st.[place], Shonta Bourque [place]

## 2012-02-10 NOTE — Progress Notes (Signed)
Blood exchange done per order- 500 ml's.  Slow blood return.  No ADN- VSS. Tolerated procedure without c/o.

## 2012-02-10 NOTE — Progress Notes (Signed)
ANTICOAGULATION CONSULT NOTE - Follow up Consult  Pharmacy Consult for Lovenox Indication: Sickle cell crisis   Allergies  Allergen Reactions  . Morphine And Related Rash  . Dilaudid (Hydromorphone Hcl)     dilusional only with 4mg  dose. Can't take "too much"  . Promethazine Hcl Hives    Patient Measurements: Height: 5\' 6"  (167.6 cm) Weight: 151 lb 14.4 oz (68.9 kg) IBW/kg (Calculated) : 63.8   Vital Signs: Temp: 98.2 F (36.8 C) (11/03 0505) Temp src: Oral (11/03 0505) BP: 131/92 mmHg (11/03 0505) Pulse Rate: 67  (11/03 0505)  Labs:  Basename 02/10/12 0630 02/09/12 0426 02/08/12 0425  HGB 12.5* 12.5* --  HCT 35.5* 35.3* 33.2*  PLT 187 182 170  APTT -- -- --  LABPROT -- -- --  INR -- -- --  HEPARINUNFRC -- -- --  CREATININE 0.74 0.74 0.76  CKTOTAL -- -- --  CKMB -- -- --  TROPONINI -- -- --    Estimated Creatinine Clearance: 119.6 ml/min (by C-G formula based on Cr of 0.74).   Medical History: Past Medical History  Diagnosis Date  . Sickle cell anemia    Assessment:  31 yom with h/o sickle cell anemia presented 10/28 with sicle cell crisis.  He continues on treatment dose Lovenox for sickle cell crisis per Dr. Gustavo Lah recommendations .   CBC stable, Plt wnl.  No bleeding/issues reported.  Renal function remains wnl and stable.  CrCl > 100 ml/min.  Goal of Therapy:  Anti-Xa level 0.6-1.2 units/ml 4hrs after LMWH dose given Monitor platelets by anticoagulation protocol: Yes   Plan:   Continue lovenox 70 mg SQ Q12h  CBC q72 hours while on Lovenox and monitor renal function   Lynann Beaver PharmD, BCPS Pager [phone] 02/10/2012 11:51 AM

## 2012-02-10 NOTE — Progress Notes (Signed)
SUBJECTIVE:  Continues to complain of pain the right hip area.  Pain progressively worse in the last two days.  Able to ambulate. The addition of oxycodone has helped.  Afebrile. Moving bowels.   MEDICATIONS:  Scheduled:   . antiseptic oral rinse  15 mL Mouth Rinse BID  . docusate sodium  100 mg Oral BID  . enoxaparin (LOVENOX) injection  70 mg Subcutaneous Q12H  . folic acid  2 mg Oral Daily  . hydroxyurea  1,000 mg Oral Daily  . multivitamin with minerals  1 tablet Oral Daily  . OxyCODONE  80 mg Oral Q12H  . pantoprazole  40 mg Oral Daily  . [COMPLETED] potassium chloride  20 mEq Oral Once  . sodium chloride  10-40 mL Intracatheter Q12H   Continuous:   . sodium chloride 50 mL/hr at 02/09/12 2352   YQM:VHQIONGEXBMWU, acetaminophen, bisacodyl, diphenhydrAMINE, HYDROmorphone (DILAUDID) injection, hydrOXYzine, LORazepam, LORazepam, ondansetron (ZOFRAN) IV, ondansetron, polyethylene glycol, sodium chloride   PHYSICAL EXAM  Vital signs in last 24 hours: Temp:  [98.2 F (36.8 C)-99.3 F (37.4 C)] 98.3 F (36.8 C) (11/03 1010) Pulse Rate:  [66-79] 66  (11/03 1010) Resp:  [16-18] 16  (11/03 1010) BP: (127-140)/(78-92) 133/83 mmHg (11/03 1010) SpO2:  [92 %-98 %] 96 % (11/03 1010) Weight:  [151 lb 14.4 oz (68.9 kg)] 151 lb 14.4 oz (68.9 kg) (11/03 0505)  Intake/Output from previous day: 11/02 0701 - 11/03 0700 In: 3370 [P.O.:1070; I.V.:2300] Out: 3150 [Urine:3150] Intake/Output this shift: Total I/O In: 240 [P.O.:240] Out: 300 [Urine:300]  General appearance: alert and cooperative Resp: clear to auscultation bilaterally and normal percussion bilaterally Cardio: regular rate and rhythm, S1, S2 normal, no murmur, click, rub or gallop GI: soft, non-tender; bowel sounds normal; no masses,  no organomegaly Extremities: extremities normal, atraumatic, no cyanosis or edema   LABS:  CBC    Component Value Date/Time   WBC 9.7 02/10/2012 0630   WBC 10.1* 01/15/2012 1122   WBC  9.8 10/23/2007 1512   RBC 4.13* 02/10/2012 0630   RBC 4.40 10/23/2007 1512   HGB 12.5* 02/10/2012 0630   HGB 11.7* 01/15/2012 1122   HGB 10.6* 10/23/2007 1512   HCT 35.5* 02/10/2012 0630   HCT 31.2* 01/15/2012 1122   HCT 32.6* 10/23/2007 1512   PLT 187 02/10/2012 0630   PLT 356 Large & Occ giant platelets 01/15/2012 1122   PLT 398 Large & giant platelets 10/23/2007 1512   MCV 86.0 02/10/2012 0630   MCV 86 01/15/2012 1122   MCV 74.0* 10/23/2007 1512   MCH 30.3 02/10/2012 0630   MCH 32.2 01/15/2012 1122   MCH 24.0* 10/23/2007 1512   MCHC 35.2 02/10/2012 0630   MCHC 37.5* 01/15/2012 1122   MCHC 32.5 10/23/2007 1512   RDW 19.4* 02/10/2012 0630   RDW 22.0* 01/15/2012 1122   RDW 21.4* 10/23/2007 1512   LYMPHSABS 2.9 02/05/2012 0330   LYMPHSABS 4.1* 01/15/2012 1122   LYMPHSABS 3.2 10/23/2007 1512   MONOABS 1.1* 02/05/2012 0330   MONOABS 1.1* 10/23/2007 1512   EOSABS 0.0 02/05/2012 0330   EOSABS 0.2 01/15/2012 1122   EOSABS 0.1 10/23/2007 1512   BASOSABS 0.2* 02/05/2012 0330   BASOSABS 0.1 01/15/2012 1122   BASOSABS 0.2* 10/23/2007 1512     Basename 02/10/12 0630 02/09/12 0426  NA 137 138  K 3.8 3.4*  CL 103 102  CO2 28 30  GLUCOSE 92 94  BUN 10 9  CREATININE 0.74 0.74  CALCIUM 9.0 8.7  XRAYS/RESULTS: No results found.   ASSESSMENT and PLAN:  Sickle cell crisis - Continues to have pain in the right hip.  Did not receive an exchange transfusion yesterday.  So we will give the go ahead to receive one today and tomorrow.  This may help pain. Continue with folic acid and hydrea.   Consider imaging right hip to rule out avascular necrosis.  Arlan Organ I., MD 02/10/2012

## 2012-02-11 ENCOUNTER — Inpatient Hospital Stay (HOSPITAL_COMMUNITY): Payer: Medicare Other

## 2012-02-11 LAB — CBC
MCH: 29.8 pg (ref 26.0–34.0)
MCV: 87.3 fL (ref 78.0–100.0)
Platelets: 250 10*3/uL (ref 150–400)
RDW: 18.6 % — ABNORMAL HIGH (ref 11.5–15.5)
WBC: 8.4 10*3/uL (ref 4.0–10.5)

## 2012-02-11 LAB — RETICULOCYTES: Retic Ct Pct: 8.3 % — ABNORMAL HIGH (ref 0.4–3.1)

## 2012-02-11 MED ORDER — GADOBENATE DIMEGLUMINE 529 MG/ML IV SOLN
13.0000 mL | Freq: Once | INTRAVENOUS | Status: AC | PRN
  Administered 2012-02-11: 13 mL via INTRAVENOUS

## 2012-02-11 MED ORDER — SODIUM CHLORIDE 0.9 % IV SOLN
INTRAVENOUS | Status: DC
  Administered 2012-02-11: 1000 mL via INTRAVENOUS
  Administered 2012-02-12: 07:00:00 via INTRAVENOUS
  Administered 2012-02-13: 1000 mL via INTRAVENOUS
  Administered 2012-02-14: 20 mL/h via INTRAVENOUS

## 2012-02-11 NOTE — Progress Notes (Signed)
Subjective: Continues to complain of pain. No specific concerns.  Objective: Vital signs in last 24 hours: Filed Vitals:   02/10/12 2105 02/10/12 2205 02/11/12 0205 02/11/12 0615  BP: 130/72 131/81 140/80 129/74  Pulse: 65 72 78 66  Temp: 98.3 F (36.8 C) 98.3 F (36.8 C) 98.3 F (36.8 C) 98.1 F (36.7 C)  TempSrc: Oral  Oral Oral  Resp: 18 18 16 16   Height:      Weight:    68 kg (149 lb 14.6 oz)  SpO2:   97% 98%   Weight change: -0.9 kg (-1 lb 15.8 oz)  Intake/Output Summary (Last 24 hours) at 02/11/12 0831 Last data filed at 02/11/12 0512  Gross per 24 hour  Intake   1906 ml  Output   1700 ml  Net    206 ml    Physical Exam: General: Awake, Oriented, No acute distress. HEENT: EOMI. Neck: Supple CV: S1 and S2 Lungs: Clear to ascultation bilaterally Abdomen: Soft, Nontender, Nondistended, +bowel sounds. Ext: Good pulses. Trace edema.  Lab Results: Basic Metabolic Panel:  Lab 02/10/12 1308 02/09/12 0426 02/08/12 0425 02/07/12 0900 02/06/12 0515 02/05/12 0330 02/04/12 1640  NA 137 138 139 139 140 -- --  K 3.8 3.4* 3.8 3.7 4.2 -- --  CL 103 102 105 106 109 -- --  CO2 28 30 28 28 25  -- --  GLUCOSE 92 94 99 87 83 -- --  BUN 10 9 8 7 8  -- --  CREATININE 0.74 0.74 0.76 0.68 0.82 -- --  CALCIUM 9.0 8.7 8.8 8.2* 8.6 -- --  MG -- -- -- -- -- 2.2 1.7  PHOS -- -- -- -- -- -- --   Liver Function Tests:  Lab 02/05/12 0330 02/04/12 1100  AST 102* 123*  ALT 36 42  ALKPHOS 86 105  BILITOT 2.8* 4.4*  PROT 6.4 7.3  ALBUMIN 3.6 4.5   No results found for this basename: LIPASE:5,AMYLASE:5 in the last 168 hours  Lab 02/04/12 1640  AMMONIA 65*   CBC:  Lab 02/11/12 0510 02/10/12 0630 02/09/12 0426 02/08/12 0425 02/07/12 0900 02/05/12 0330 02/04/12 1100  WBC 8.4 9.7 10.2 10.1 9.3 -- --  NEUTROABS -- -- -- -- -- 10.9* 10.6*  HGB 12.9* 12.5* 12.5* 11.8* 10.5* -- --  HCT 37.8* 35.5* 35.3* 33.2* 28.6* -- --  MCV 87.3 86.0 85.1 86.2 85.1 -- --  PLT 250 187 182 170 172  -- --   Cardiac Enzymes:  Lab 02/05/12 0201 02/04/12 2008 02/04/12 1310  CKTOTAL -- -- --  CKMB -- -- --  CKMBINDEX -- -- --  TROPONINI <0.30 <0.30 <0.30   BNP (last 3 results)  Basename 02/04/12 1641  PROBNP 158.2*   CBG: No results found for this basename: GLUCAP:5 in the last 168 hours No results found for this basename: HGBA1C:5 in the last 72 hours Other Labs: No components found with this basename: POCBNP:3  Lab 02/04/12 1327  DDIMER 7.80*   No results found for this basename: CHOL:2,HDL:2,LDLCALC:2,TRIG:2,CHOLHDL:2,LDLDIRECT:2 in the last 168 hours No results found for this basename: TSH,T4TOTAL,FREET3,T3FREE,FREET4,THYROIDAB in the last 168 hours  Lab 02/11/12 0510 02/10/12 0630  VITAMINB12 -- --  FOLATE -- --  FERRITIN -- --  TIBC -- --  IRON -- --  RETICCTPCT 8.3* 10.9*    Micro Results: Recent Results (from the past 240 hour(s))  CULTURE, BLOOD (ROUTINE X 2)     Status: Normal   Collection Time   02/04/12  1:27 PM  Component Value Range Status Comment   Specimen Description BLOOD RIGHT ARM   Final    Special Requests BOTTLES DRAWN AEROBIC AND ANAEROBIC 10CC   Final    Culture  Setup Time 02/04/2012 20:52   Final    Culture NO GROWTH 5 DAYS   Final    Report Status 02/10/2012 FINAL   Final   CULTURE, BLOOD (ROUTINE X 2)     Status: Normal   Collection Time   02/04/12  1:29 PM      Component Value Range Status Comment   Specimen Description BLOOD RIGHT HAND   Final    Special Requests BOTTLES DRAWN AEROBIC AND ANAEROBIC 10CC   Final    Culture  Setup Time 02/04/2012 20:52   Final    Culture NO GROWTH 5 DAYS   Final    Report Status 02/10/2012 FINAL   Final   MRSA PCR SCREENING     Status: Abnormal   Collection Time   02/04/12  4:19 PM      Component Value Range Status Comment   MRSA by PCR POSITIVE (*) NEGATIVE Final   URINE CULTURE     Status: Normal   Collection Time   02/04/12  6:58 PM      Component Value Range Status Comment    Specimen Description URINE, CLEAN CATCH   Final    Special Requests NONE   Final    Culture  Setup Time 02/05/2012 02:18   Final    Colony Count NO GROWTH   Final    Culture NO GROWTH   Final    Report Status 02/05/2012 FINAL   Final   CULTURE, EXPECTORATED SPUTUM-ASSESSMENT     Status: Normal   Collection Time   02/04/12  7:00 PM      Component Value Range Status Comment   Specimen Description SPUTUM   Final    Special Requests NONE   Final    Sputum evaluation     Final    Value: THIS SPECIMEN IS ACCEPTABLE. RESPIRATORY CULTURE REPORT TO FOLLOW.   Report Status 02/04/2012 FINAL   Final   CULTURE, RESPIRATORY     Status: Normal   Collection Time   02/04/12  7:00 PM      Component Value Range Status Comment   Specimen Description SPUTUM   Final    Special Requests NONE   Final    Gram Stain     Final    Value: NO WBC SEEN     FEW SQUAMOUS EPITHELIAL CELLS PRESENT     MODERATE GRAM POSITIVE COCCI IN CLUSTERS     IN PAIRS MODERATE GRAM NEGATIVE RODS     FEW GRAM POSITIVE RODS   Culture     Final    Value: NORMAL OROPHARYNGEAL FLORA     Note: MICROSCOPIC FINDINGS SUGGEST THAT THIS SPECIMEN IS NOT REPRESENTATIVE OF LOWER RESPIRATORY SECRETIONS. PLEASE RECOLLECT.   Report Status 02/07/2012 FINAL   Final     Studies/Results: No results found.  Medications: I have reviewed the patient's current medications. Scheduled Meds:    . antiseptic oral rinse  15 mL Mouth Rinse BID  . docusate sodium  100 mg Oral BID  . enoxaparin (LOVENOX) injection  70 mg Subcutaneous Q12H  . folic acid  2 mg Oral Daily  . hydroxyurea  1,000 mg Oral Daily  . multivitamin with minerals  1 tablet Oral Daily  . OxyCODONE  80 mg Oral Q12H  . pantoprazole  40 mg  Oral Daily  . sodium chloride  10-40 mL Intracatheter Q12H   Continuous Infusions:    . sodium chloride    . [DISCONTINUED] sodium chloride 50 mL/hr at 02/10/12 2224   PRN Meds:.acetaminophen, acetaminophen, bisacodyl, diphenhydrAMINE,  HYDROmorphone (DILAUDID) injection, hydrOXYzine, LORazepam, LORazepam, ondansetron (ZOFRAN) IV, ondansetron, polyethylene glycol, sodium chloride  Assessment/Plan: Acute respiratory failure Likely secondary to pain medications, resolved with decreasing his pain medications. CT angiogram of the chest was negative for PE. Chest x-ray with some appearance of a vascular congestion.   Acute sickle cell crisis  No source of infection found. Patient still with significant pain and itching all over, slowly improving from admission.  CT angiogram of the chest was negative for pulmonary embolus. Continue IV pain medication, on home long acting oxycodone. Continue hydroxyzine and benadryl as needed for itching. Patient has been started on the Emend per hematology for itching. Continue hydration and folic acid. Patient had exchange transfusion with on 02/05/2012, 02/06/2012, 02/07/2012, 02/08/2012, and 02/10/2012.   Acute delirium/Altered mental status/Acue Encephalopathy Likely due to sickle cell crisis and pain medications. Resolved.  Leukocytosis  Resolved. Likely due to acute sickle cell crisis. Patient was recently treated with a Z-Pak. Sputum Gram stain negative. Blood and urine cultures shows no growth to date. Chest x-ray is negative for acute infiltrate. Empiric IV Levaquin 02/04/2012-02/07/2012. No sources of infection found. Blood and urine cultures show no growth to date.  Dehydration  Resolved with IV fluids.  Anemia  Secondary to sickle cell crisis. Hemoglobin stable.   Hypokalemia Replace as needed.  Prophylaxis  PPI for GI prophylaxis. Lovenox for DVT prophylaxis.   Code Status: Full code.  Family Communication: Updated patient at bedside  Disposition Plan: Home when medically stable, continue in med-surg.   Consultants:  Critical care medicine: Dr. Sherene Sires 02/04/2012, signed off. Hematology oncology: Dr. Myna Hidalgo 02/04/2012  Procedures:  CXR 02/04/12, 02/05/12  Plain films of the  right hip 02/04/2012  Plain films of the L-spine 02/04/2012  CT of the chest 02/04/2012  PICC line 02/05/2012  Antibiotics:  IV Levaquin 02/04/12-02/07/2012.  Dr. Myna Hidalgo will assume patient's care as disposition is dependent on improvement in patient's sickle cell crisis which is currently managed by him. Appreciate Dr. Myna Hidalgo assuming patient's care.   LOS: 7 days  Anelly Samarin A, MD 02/11/2012, 8:31 AM

## 2012-02-11 NOTE — Progress Notes (Signed)
[name] did okay over the weekend. Still having significant pain in the right hip. We'll get an MRI to make sure there is no avascular necrosis. 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 We can also check make sure that there is no fracture.  His sickling crisis is resolving. His reticulocyte count is now down to 8%. His hemoglobin is 13. He does not need anymore exchange transfusions.  He's had no fever. Is no cough. I still feel that her supplemental oxygen will help as well as incentive spirometry.  His appetite is improving.  His vital signs are temperature 90.1 pulse 66 was brought rate 16 and blood pressure 129/74 head exam shows no ocular or oral lesions. There is no obvious scleral icterus. There is no adenopathy in the neck. Lungs are clear bilaterally. Cardiac exam is regular rate and rhythm with a normal S1-S2. There are no murmurs rubs or bruits abdominal exam soft good bowel sounds. There is a palpable abdominal mass. I cannot palpate liver or spleen. Extremities shows continued discomfort in the right hip. There is decreased range of motion in the right hip. The swelling is noted in the legs.  Again,[name]'s crises usually take 10-14 days. I feel that he is still on track. The pain issues are improving from my point of view. His back on OxyContin. Hopefully he can be transitioned over to his short-acting oxycodone.  I suspect that he still going to take another 3 or 4 days before he is ready for discharge.   [name]   [name] 1:9

## 2012-02-12 LAB — RETICULOCYTES: Retic Count, Absolute: 340.3 10*3/uL — ABNORMAL HIGH (ref 19.0–186.0)

## 2012-02-12 MED ORDER — VITAMINS A & D EX OINT
TOPICAL_OINTMENT | CUTANEOUS | Status: AC
  Administered 2012-02-12: 08:00:00
  Filled 2012-02-12: qty 5

## 2012-02-12 NOTE — Progress Notes (Signed)
[name] is better - as expected. He is following true to form and again, his crises takes about 7-10 days to begin to improve.  His reticulocyte count is now down below 8. His right hip is not as bad. He had MRI last night. Results are still pending.  He's getting out of bed better. He has no nausea. Does note dyspnea. He's had no fever or chills. He continues on Lovenox. He is off antibiotics.  On his physical exam vital signs are all stable. Blood pressure 114/65. Oxygen saturation is 98%. He has been diligent with using his incentive parameter. His lungs are clear bilaterally. Cardiac exam regular rate and rhythm with a normal S1 and S2. There are no murmurs rubs or bruits . Abdominal exam is soft. There is no fluid wave. There is no palpable hepato- splenomegaly extremities shows no clubbing cyanosis or edema. Neurological exam shows no focal neurological deficits.    I suspect that he will be ready for discharge in the 2 or 3 days. We will continue his current regimen. I will cut back his IV fluids a little bit. We will continue to encourage out of bed activities.  [name]  1 Chronicles 17:20

## 2012-02-13 DIAGNOSIS — M87059 Idiopathic aseptic necrosis of unspecified femur: Secondary | ICD-10-CM

## 2012-02-13 LAB — CBC
HCT: 39.5 % (ref 39.0–52.0)
MCV: 89.4 fL (ref 78.0–100.0)
RBC: 4.42 MIL/uL (ref 4.22–5.81)
WBC: 6.1 10*3/uL (ref 4.0–10.5)

## 2012-02-13 NOTE — Progress Notes (Signed)
ANTICOAGULATION CONSULT NOTE - Follow up Consult  Pharmacy Consult for Lovenox Indication: Sickle cell crisis   Allergies  Allergen Reactions  . Morphine And Related Rash  . Dilaudid (Hydromorphone Hcl)     dilusional only with 4mg  dose. Can't take "too much"  . Promethazine Hcl Hives    Patient Measurements: Height: 5\' 6"  (167.6 cm) Weight: 150 lb 3.2 oz (68.13 kg) IBW/kg (Calculated) : 63.8   Vital Signs: Temp: 97.5 F (36.4 C) (11/06 0605) Temp src: Oral (11/06 0605) BP: 115/70 mmHg (11/06 0605) Pulse Rate: 85  (11/06 0605)  Labs:  Basename 02/13/12 0520 02/11/12 0510  HGB 13.3 12.9*  HCT 39.5 37.8*  PLT 375 250  APTT -- --  LABPROT -- --  INR -- --  HEPARINUNFRC -- --  CREATININE -- --  CKTOTAL -- --  CKMB -- --  TROPONINI -- --    Estimated Creatinine Clearance: 119.6 ml/min (by C-G formula based on Cr of 0.74).   Medical History: Past Medical History  Diagnosis Date  . Sickle cell anemia    Assessment:  31 yom with h/o sickle cell anemia presented 10/28 with sicle cell crisis.  He continues on treatment dose Lovenox for sickle cell crisis per Dr. Gustavo Lah recommendations .   CBC stable, Plt wnl.  No bleeding/issues reported.  Last BMET 11/3.  CrCl > 100 ml/min.  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 Clinical Oncology Pharmacist  Pager # [phone] 02/13/2012 7:51 AM

## 2012-02-13 NOTE — Progress Notes (Signed)
Have a new problem. 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 This is what is causing his pain. It is still hard for him to walk around a lot. I'm going to have to speak to orthopedic surgery. I will need to see if they feel this is an indicator for hip replacement. He is on Lovenox I will continue him on Lovenox.  Outside the right hip, he is not having much in the way of pain. It is the right hip that is causing him problems.  His appetite is okay. He's not having any shortness of breath. There is no problems going to the bathroom.   his reticulocyte count is now down to 5.4%. His hemoglobin is 13.3. As such, the sickling has resolved.   On physical exam this is a well-developed well-nourished black male  in mild distress secondary to the right hip issues. Vital signs show a temperature of 97 5 pulse 85 respiratory 18 blood pressure 115/70 lungs are clear bilaterally. Cardiac exam regular rate and rhythm with a normal S1-S2. Abdominal exam soft. Has good bowel sounds. Extremities shows decreased range of motion of the right hip. There is some tenderness to palpation over the lateral right hip. Neurological exam shows no focal neurological deficits.  Again, we'll have to speak with orthopedic surgery about the possibility of hip replacement. At some point, I would think that he would need hip replacement as he will develop avascular necrosis.  We will continue his pain regimen as scheduled.  I cannot let him go home yet until we get this right hip issue resolved. [name]  Malachi 3:1

## 2012-02-14 LAB — TYPE AND SCREEN: Unit division: 0

## 2012-02-14 LAB — RETICULOCYTES: Retic Count, Absolute: 178.4 10*3/uL (ref 19.0–186.0)

## 2012-02-14 MED ORDER — METHYLPREDNISOLONE SODIUM SUCC 125 MG IJ SOLR
125.0000 mg | Freq: Two times a day (BID) | INTRAMUSCULAR | Status: AC
  Administered 2012-02-14 (×2): 125 mg via INTRAVENOUS
  Filled 2012-02-14 (×2): qty 2

## 2012-02-14 NOTE — Progress Notes (Signed)
[name] is still having some of the issues with the right hip. I did speak with orthopedic surgery yesterday. They do not see that any surgery was indicated as long as there was no collapse of the femoral head. They just recommended continued supportive care with him pain intervention. I will given him some Solu-Medrol today to see if this helps low but.  He is out of bed. His appetite is good. He's had no shortness of breath. There is no nausea vomiting.  He continues on Lovenox. I want to keep the Lovenox through the hospitalization.  His reticulocyte count is now 4%. I will hold off on any further blood work on him. His sickling crisis has resolved.  On physical exam this is a well-developed well-nourished African American gentleman. Vital signs show a temperature of 97 6 pulse 61 respiratory rate 18 blood pressure 124/64 oxygen saturation 100%. Lungs are clear bilaterally. Cardiac exam regular rate and rhythm with a normal S1-S2. There are no murmurs rubs or bruits. Abdominal exam soft. There is no fluid wave. There is no palpable hepato- splenomegaly extremities shows some tenderness in the right hip. He has a little bit better range of motion of the right hip.  Hopefully we'll be able to consider discharge tomorrow. Will try IV steroid today. He will continue his folic acid.  [name]  Psalm 106:1

## 2012-02-14 NOTE — Care Management Note (Signed)
    Page 1 of 2   02/15/2012     10:55:18 AM   CARE MANAGEMENT NOTE 02/15/2012  Patient:  DHANI, FAHRER   Account Number:  [id number]  Date Initiated:  02/05/2012  Documentation initiated by:  DAVIS,RHONDA  Subjective/Objective Assessment:   patient with ssc, and now o2 level of 71% on 02,     Action/Plan:   from home   Anticipated DC Date:  02/08/2012   Anticipated DC Plan:  HOME/SELF CARE  In-house referral  NA      DC Planning Services  NA      PAC Choice  NA   Choice offered to / List presented to:  NA   DME arranged  NA      DME agency  NA     HH arranged  NA      HH agency  NA   Status of service:  Completed, signed off Medicare Important Message given?  NA - LOS <3 / Initial given by admissions (If response is "NO", the following Medicare IM given date fields will be blank) Date Medicare IM given:   Date Additional Medicare IM given:    Discharge Disposition:  HOME/SELF CARE  Per UR Regulation:  Reviewed for med. necessity/level of care/duration of stay  If discussed at Long Length of Stay Meetings, dates discussed:   02/12/2012  02/14/2012    Comments:  02/14/2012  10:30am  Konrad Felix RN, case mgr.   [phone] Paged and spoke with Dr. Myna Hidalgo with regard to discharge plan. Offered assistance with [facility] services, equipment, PT, sources to assist with patient comfort once discharged. Dr. Myna Hidalgo denied any need for [facility] services and stated patient will be discharged to home tomorrow.  [id number]/FAOZHY Earlene Plater, RN, BSN, CCM: CHART REVIEWED AND UPDATED. NO DISCHARGE NEEDS PRESENT AT THIS TIME. CASE MANAGEMENT [phone]

## 2012-02-15 ENCOUNTER — Other Ambulatory Visit: Payer: Self-pay | Admitting: Hematology & Oncology

## 2012-02-15 DIAGNOSIS — D5700 Hb-SS disease with crisis, unspecified: Secondary | ICD-10-CM

## 2012-02-15 NOTE — Discharge Summary (Signed)
#   [id number] is d/c summary.  [name]  Lamentations 3:22-24

## 2012-02-15 NOTE — Progress Notes (Signed)
DC instructions given to patient, verbalized understanding.

## 2012-02-15 NOTE — Progress Notes (Signed)
Patient d/c at 1250, stable,accompanied by NT via wheelchair. Hulda Marin RN

## 2012-02-15 NOTE — Discharge Summary (Signed)
[name], [name]                ACCOUNT NO.:  [id number]  MEDICAL RECORD NO.:  [id number]  LOCATION:  1317                         FACILITY:  [facility]  PHYSICIAN:  Josph Macho, M.D.  DATE OF BIRTH:  1980-07-29  DATE OF ADMISSION:  02/04/2012 DATE OF DISCHARGE:  02/15/2012                              DISCHARGE SUMMARY   DIAGNOSES UPON DISCHARGE: 1. Sickle cell crisis. 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  CONDITION UPON DISCHARGE:  Stable.  ACTIVITIES:  As tolerated.  I told him no twisting that would put rotational pressure on the right hip.  DIET:  Without restrictions.  FOLLOWUP:  He will come to Dr. Gustavo Lah office at [facility] in 2 weeks.  MEDICATIONS UPON DISCHARGE: 1. Hydrea 500 mg p.o. b.i.d. 2. OxyContin 80 mg p.o. b.i.d. 3. Roxicodone 30 mg p.o. q.6 hours p.r.n. 4. Folic acid 1 mg p.o. daily. 5. Xanax 1 mg p.o. t.i.d. p.r.n. 6. Vitamin B12 2500 mcg p.o. daily. 7. Vitamin D 1000 units p.o. daily.  HOSPITAL COURSE:  Mr. Gali was admitted to the ICU by the Hospitalist.  He came in with a crisis.  It was just a little crisis. He was having lot of pain in his right hip.  He did have some hypoxia. CT angiogram was done, which did not show any evidence of pulmonary embolism.  He was given IV fluids.  He was started on oxygen.  He had a PICC line placed.  We did go ahead and do exchange transfusions on him.  He got 5 days of exchange transfusions.  When was admitted, his reticulocyte count I think was about 19%.  This gradually improved.  As his sickle cell crisis resolved, his hemoglobin stabilized and his reticulocyte count normalized.  On the 6th, his hemoglobin was 13.3.  His retic count was 4.1% on the 7th.  There was some episodes of confusion initially.  Sometimes, it happens when his pain medications were changed to IV.  We got him out of the ICU after 5 days.  He was sent to the  Oncology Floor.  He became more mobile.  His appetite improved.  His vital signs stabilized.  Oximetry saturation was maintained above 95%.  He used to his incentive spirometer frequently.  I did go ahead and get MRI of the right hip.  This is because of the pain that was having.  Surprisingly enough, he had a new bone infarct in the femoral head.  There was no associated collapse of the femoral head. He has some surrounding marrow edema.  Of note, when he was admitted, I did start him on Lovenox.  There were some studies that suggest therapeutic anticoagulation can decrease the duration of sickling crisis.  He remained afebrile.  He was started on IV antibiotics, initially was Levaquin.  All cultures were negative.  Antibiotics were stopped after about 3 days.  He was able to be discharged on the 8th.  He was ambulating.  His pain was under good control.  I thought, he could go home and he could go back on his oral regimen.  On the day of discharge,  his vital signs were all stable.  Temperature is 97.8, pulse 74, respiratory rate 16, blood pressure 125/72, oximetry saturation was 96%.  His lungs were clear bilaterally.  Cardiac exam, regular rate and rhythm with a normal S1, S2.  There are no murmurs, rubs, or bruits.  Abdomen exam, soft with good bowel sounds.  There was no palpable abdominal mass.  There was no fluid wave.  No palpable hepatosplenomegaly.  Extremities showed improved tenderness over in the right hip.  He has some better range of motion of the right hip.  There was no edema in his legs.  Neurological exam shows no focal neurological deficits.     Josph Macho, M.D.     PRE/MEDQ  D:  02/15/2012  T:  02/15/2012  Job:  [id number]

## 2012-02-18 ENCOUNTER — Telehealth: Payer: Self-pay | Admitting: Hematology & Oncology

## 2012-02-18 NOTE — Telephone Encounter (Signed)
Pt aware of 11-18 appointment °

## 2012-02-25 ENCOUNTER — Ambulatory Visit (HOSPITAL_BASED_OUTPATIENT_CLINIC_OR_DEPARTMENT_OTHER): Payer: Medicare Other | Admitting: Hematology & Oncology

## 2012-02-25 ENCOUNTER — Other Ambulatory Visit (HOSPITAL_BASED_OUTPATIENT_CLINIC_OR_DEPARTMENT_OTHER): Payer: Medicare Other | Admitting: Lab

## 2012-02-25 VITALS — BP 100/62 | HR 65 | Temp 98.0°F | Resp 18 | Ht 66.0 in | Wt 151.0 lb

## 2012-02-25 DIAGNOSIS — D5700 Hb-SS disease with crisis, unspecified: Secondary | ICD-10-CM

## 2012-02-25 DIAGNOSIS — M25559 Pain in unspecified hip: Secondary | ICD-10-CM

## 2012-02-25 LAB — CBC WITH DIFFERENTIAL (CANCER CENTER ONLY)
BASO#: 0.1 10*3/uL (ref 0.0–0.2)
BASO%: 1.5 % (ref 0.0–2.0)
EOS%: 2.3 % (ref 0.0–7.0)
HGB: 12.8 g/dL — ABNORMAL LOW (ref 13.0–17.1)
MCH: 30.5 pg (ref 28.0–33.4)
MCHC: 33.8 g/dL (ref 32.0–35.9)
MONO%: 11.9 % (ref 0.0–13.0)
NEUT#: 3 10*3/uL (ref 1.5–6.5)
NEUT%: 49.6 % (ref 40.0–80.0)
RDW: 15.7 % (ref 11.1–15.7)

## 2012-02-25 NOTE — Progress Notes (Signed)
This office note has been dictated.

## 2012-02-26 NOTE — Progress Notes (Signed)
CC:   Lorelle Formosa, M.D.  DIAGNOSIS:  Hemoglobin SS disease.  CURRENT THERAPY: 1. Hydrea 500 mg p.o. b.i.d. 2. Folic acid 1 mg p.o. daily.  INTERIM HISTORY:  Mr. Fann comes in for his followup.  He was hospitalized for about 10 days.  He had a sickle crisis and unfortunately developed an infarct in the right femoral head.  I spoke with Orthopedic Surgery who did not feel that he needed any hip replacement.  He was exchange transfused while in the hospital.  This helped him out immensely.  He is still having some discomfort on that side.  He is on chronic OxyContin at 80 mg twice a day.  He is also on oxycodone 30 mg every 6 hours as needed.  This does seem to help him out quite a bit.  He was to go to [place], I think this month, but will have to hold off on this until May.  I do not think it is safe for him to travel after having a crisis for about 2 or 3 months.  His appetite is doing better.  He is having no cough.  He is having no headache.  He is having no change in bowel or bladder habits.  PHYSICAL EXAMINATION:  General:  This is a well-developed, well- nourished black gentleman in no obvious distress.  Vital signs: Temperature of 98, pulse 65, respiratory rate 18, blood pressure 100/62. Weight is 151.  Head and neck:  Normocephalic, atraumatic skull.  There are no ocular or oral lesions.  There are no palpable cervical or supraclavicular lymph nodes.  Lungs:  Clear bilaterally.  Cardiac: Regular rate and rhythm with a normal S1 and S2.  There are no murmurs, rubs, or bruits.  Abdomen:  Soft with good bowel sounds.  There is no palpable abdominal mass.  There is no palpable hepatosplenomegaly. Extremities:  Some tenderness over the right hip.  The left hip is unremarkable.  He does have some decreased range of motion with the right hip.  There is no swelling in his legs.  LABORATORY STUDIES:  White cell count 16, hemoglobin 12.8, hematocrit 37.9, platelet  count 460.  MCV is 91.  IMPRESSION:  Mr. Legan is a 31 year old gentleman with hemoglobin SS disease.  He is doing well with this.  He just had a crisis.  He was in the hospital for about 10 days.  He has only had 3 crises this year, so for him, that is pretty good.  I do worry about the right hip.  I suspect that he may need to have this replaced at some point in the future.  We will plan to get him back in a month.  I do not see that we have to phlebotomize him right now.    ______________________________ Josph Macho, M.D. PRE/MEDQ  D:  02/25/2012  T:  02/26/2012  Job:  [id number]

## 2012-02-28 LAB — HEMOGLOBINOPATHY EVALUATION
Hemoglobin Other: 0 %
Hgb A2 Quant: 2.8 % (ref 2.2–3.2)
Hgb A: 55 % — ABNORMAL LOW (ref 96.8–97.8)
Hgb S Quant: 35.6 % — ABNORMAL HIGH

## 2012-02-28 LAB — RETICULOCYTES (CHCC): Retic Ct Pct: 2.1 % (ref 0.4–2.3)

## 2012-02-28 LAB — IRON AND TIBC: UIBC: 113 ug/dL — ABNORMAL LOW (ref 125–400)

## 2012-03-05 ENCOUNTER — Other Ambulatory Visit: Payer: Self-pay | Admitting: Hematology & Oncology

## 2012-03-05 ENCOUNTER — Other Ambulatory Visit: Payer: Self-pay | Admitting: *Deleted

## 2012-03-05 DIAGNOSIS — D5700 Hb-SS disease with crisis, unspecified: Secondary | ICD-10-CM

## 2012-03-05 DIAGNOSIS — R52 Pain, unspecified: Secondary | ICD-10-CM

## 2012-03-05 MED ORDER — OXYCODONE HCL 30 MG PO TABS: 30.0000 mg | ORAL_TABLET | Freq: Four times a day (QID) | ORAL | Status: DC | PRN

## 2012-03-05 MED ORDER — OXYCODONE HCL 80 MG PO TB12: 80.0000 mg | ORAL_TABLET | Freq: Two times a day (BID) | ORAL | Status: DC

## 2012-03-05 NOTE — Telephone Encounter (Signed)
Pt called requesting Oxycontin and Oxy-IR refills. This was last prescribed on 10/23. He was admitted to the hospital on 02/04/12 and discharged on 02/15/12. Upon review of this he should have enough to get him through until the first part of Dec. Dr. Myna Hidalgo spoke to the pt. He explained that it was too early to have his rx filled now but can on 03/09/12. He verbalized understanding. Will forward rxs to Dr Myna Hidalgo for approval & signature.

## 2012-03-12 ENCOUNTER — Ambulatory Visit: Payer: Medicare Other | Admitting: Hematology & Oncology

## 2012-03-12 ENCOUNTER — Other Ambulatory Visit: Payer: Medicare Other | Admitting: Lab

## 2012-03-28 ENCOUNTER — Other Ambulatory Visit (HOSPITAL_BASED_OUTPATIENT_CLINIC_OR_DEPARTMENT_OTHER): Payer: Medicare Other | Admitting: Lab

## 2012-03-28 ENCOUNTER — Ambulatory Visit (HOSPITAL_BASED_OUTPATIENT_CLINIC_OR_DEPARTMENT_OTHER): Payer: Medicare Other | Admitting: Medical

## 2012-03-28 ENCOUNTER — Ambulatory Visit: Payer: Medicare Other

## 2012-03-28 VITALS — BP 117/74 | HR 78 | Temp 98.4°F | Resp 16 | Ht 66.0 in | Wt 153.0 lb

## 2012-03-28 DIAGNOSIS — D5720 Sickle-cell/Hb-C disease without crisis: Secondary | ICD-10-CM

## 2012-03-28 DIAGNOSIS — D5700 Hb-SS disease with crisis, unspecified: Secondary | ICD-10-CM

## 2012-03-28 LAB — CBC WITH DIFFERENTIAL (CANCER CENTER ONLY)
BASO%: 0.6 % (ref 0.0–2.0)
Eosinophils Absolute: 0.1 10*3/uL (ref 0.0–0.5)
LYMPH%: 19 % (ref 14.0–48.0)
MCH: 30.1 pg (ref 28.0–33.4)
MCV: 88 fL (ref 82–98)
MONO#: 0.9 10*3/uL (ref 0.1–0.9)
MONO%: 8.9 % (ref 0.0–13.0)
NEUT#: 7 10*3/uL — ABNORMAL HIGH (ref 1.5–6.5)
Platelets: 376 10*3/uL (ref 145–400)
RBC: 4.18 10*6/uL — ABNORMAL LOW (ref 4.20–5.70)
RDW: 16.9 % — ABNORMAL HIGH (ref 11.1–15.7)
WBC: 9.8 10*3/uL (ref 4.0–10.0)

## 2012-03-28 NOTE — Progress Notes (Signed)
Seen by Eunice Blase, PA, no phlebotomy needed. [name], [name] [employer]

## 2012-03-28 NOTE — Progress Notes (Signed)
Diagnosis: Hemoglobin, SS.  Disease.  Current therapy: #1 Hydrea, 500 mg by mouth twice a day. #2 folic acid 1 mg by mouth daily.  Interim history:  [name] presents today for an office followup visit.  Overall, he, reports she's been doing relatively well.  He did have a ten-day hospitalization.  Back in October for sickle cell crisis.  He did develop an infarct in the right femoral head.  Orthopedic surgery did not feel that he needed a hip replacement at that time.  He did have an exchange transfusion while in the hospital.  He is on chronic OxyContin 80 mg twice a day.  He is also on oxycodone 30 mg every 6 hours as needed.  This seems to help with any pain.  Unfortunately, he had planned to go out of the country to [place], however, secondary to his sickle cell crisis it was not safe for him to travel for at least 2-3 months.  We will type up a letter for him so hopefully, the airlines wave his Re-booking fee.  He, reports, that he has a good appetite.  He denies any nausea, vomiting, diarrhea, constipation, chest pain, shortness of breath, or cough.  He denies any lower leg swelling.  He denies any abdominal pain.  He denies any back pain.  He denies any obvious, or abnormal bleeding.  He denies any fevers, chills, or night sweats.  He denies any headaches, visual changes, or rashes.    Review of Systems: Constitutional:Negative for malaise/fatigue, fever, chills, weight loss, diaphoresis, activity change, appetite change, and unexpected weight change.  HEENT: Negative for double vision, blurred vision, visual loss, ear pain, tinnitus, congestion, rhinorrhea, epistaxis sore throat or sinus disease, oral pain/lesion, tongue soreness Respiratory: Negative for cough, chest tightness, shortness of breath, wheezing and stridor.  Cardiovascular: Negative for chest pain, palpitations, leg swelling, orthopnea, PND, DOE or claudication Gastrointestinal: Negative for nausea, vomiting, abdominal pain,  diarrhea, constipation, blood in stool, melena, hematochezia, abdominal distention, anal bleeding, rectal pain, anorexia and hematemesis.  Genitourinary: Negative for dysuria, frequency, hematuria,  Musculoskeletal: Negative for myalgias, back pain, joint swelling, arthralgias and gait problem.  Skin: Negative for rash, color change, pallor and wound.  Neurological:. Negative for dizziness/light-headedness, tremors, seizures, syncope, facial asymmetry, speech difficulty, weakness, numbness, headaches and paresthesias.  Hematological: Negative for adenopathy. Does not bruise/bleed easily.  Psychiatric/Behavioral:  Negative for depression, no loss of interest in normal activity or change in sleep pattern.   Physical Exam: This is a pleasant, 31 year old, well-developed, well-nourished, African American, gentleman, in no obvious distress Vitals: Temperature 90.4 degrees, pulse 70, respirations 16, blood pressure 117/74 weight 153 pounds HEENT reveals a normocephalic, atraumatic skull, no scleral icterus, no oral lesions  Neck is supple without any cervical or supraclavicular adenopathy.  Lungs are clear to auscultation bilaterally. There are no wheezes, rales or rhonci Cardiac is regular rate and rhythm with a normal S1 and S2. There are no murmurs, rubs, or bruits.  Abdomen is soft with good bowel sounds, there is no palpable mass. There is no palpable hepatosplenomegaly. There is no palpable fluid wave.  Musculoskeletal no tenderness of the spine, ribs, or hips.  Extremities there are no clubbing, cyanosis, or edema.  Skin no petechia, purpura or ecchymosis Neurologic is nonfocal.  Laboratory Data: White count 9.8, hemoglobin 12.6, hematocrit 36.6, platelets 376,000  Current Outpatient Prescriptions on File Prior to Visit  Medication Sig Dispense Refill  . ALPRAZolam (XANAX) 1 MG tablet 1 mg 3 (three) times daily  as needed.       . Cyanocobalamin (VITAMIN B 12 PO) Take 1 tablet by mouth.       . folic acid (FOLVITE) 1 MG tablet Take 1 mg by mouth daily.        . hydroxyurea (HYDREA) 500 MG capsule Take 2 capsules (1,000 mg total) by mouth daily. May take with food to minimize GI side effects.  60 capsule  2  . Multiple Vitamin (MULITIVITAMIN WITH MINERALS) TABS Take 1 tablet by mouth daily.      Marland Kitchen oxyCODONE (OXYCONTIN) 80 MG 12 hr tablet Take 1 tablet (80 mg total) by mouth every 12 (twelve) hours. May fill on 03/10/12  60 tablet  0  . oxycodone (ROXICODONE) 30 MG immediate release tablet Take 1-2 tablets (30-60 mg total) by mouth every 6 (six) hours as needed. May fill on 03/10/12.  120 tablet  0   Assessment/Plan: This is a pleasant, 31 year old, African American, male, with the following issues:  #1, hemoglobin, SS disease.  He is doing well with this.  He's not had a crisis since October.  He has only had 3 crises this year, which is pretty good for him.  He did have to hold off his travel plans to [place].  He has plans to travel in May.  I do not see that we have to phlebotomize him.  Right now.  #2.  Followup.  We will follow back up with [name] in one month, but before then should there be questions or concerns.

## 2012-04-01 ENCOUNTER — Telehealth: Payer: Self-pay | Admitting: Hematology & Oncology

## 2012-04-01 ENCOUNTER — Other Ambulatory Visit: Payer: Self-pay | Admitting: Medical

## 2012-04-01 LAB — IRON AND TIBC
%SAT: 67 % — ABNORMAL HIGH (ref 20–55)
Iron: 194 ug/dL — ABNORMAL HIGH (ref 42–165)
TIBC: 288 ug/dL (ref 215–435)
UIBC: 94 ug/dL — ABNORMAL LOW (ref 125–400)

## 2012-04-01 LAB — HEMOGLOBINOPATHY EVALUATION
Hemoglobin Other: 0 %
Hgb A2 Quant: 2.8 % (ref 2.2–3.2)
Hgb A: 41.7 % — ABNORMAL LOW (ref 96.8–97.8)
Hgb F Quant: 8 % — ABNORMAL HIGH (ref 0.0–2.0)
Hgb S Quant: 47.5 % — ABNORMAL HIGH

## 2012-04-01 LAB — RETICULOCYTES (CHCC)
ABS Retic: 384.3 10*3/uL — ABNORMAL HIGH (ref 19.0–186.0)
RBC.: 4.27 MIL/uL (ref 4.22–5.81)
Retic Ct Pct: 9 % — ABNORMAL HIGH (ref 0.4–2.3)

## 2012-04-01 NOTE — Telephone Encounter (Signed)
Pt aware letter is ready for pick up

## 2012-04-10 ENCOUNTER — Other Ambulatory Visit: Payer: Self-pay | Admitting: *Deleted

## 2012-04-10 DIAGNOSIS — D5700 Hb-SS disease with crisis, unspecified: Secondary | ICD-10-CM

## 2012-04-10 DIAGNOSIS — R52 Pain, unspecified: Secondary | ICD-10-CM

## 2012-04-10 MED ORDER — OXYCODONE HCL 30 MG PO TABS: 30.0000 mg | ORAL_TABLET | Freq: Four times a day (QID) | ORAL | Status: DC | PRN

## 2012-04-10 MED ORDER — OXYCODONE HCL 80 MG PO TB12: 80.0000 mg | ORAL_TABLET | Freq: Two times a day (BID) | ORAL | Status: DC

## 2012-04-10 NOTE — Telephone Encounter (Signed)
Pt called this morning requesting rx for Oxycontin and Roxicodone. He last had his narcotics filled on 03/10/12. Will route to Dr Myna Hidalgo for approval then place at the front desk for pick up.  Of note:  Had to reissue the 1st Roxicodone rx because the "may fill on 03/10/12" wasn't removed from the sig. The rx was not signed. It was destroyed & placed in the shredder.

## 2012-04-22 ENCOUNTER — Ambulatory Visit: Payer: Medicare Other | Admitting: Hematology & Oncology

## 2012-04-22 ENCOUNTER — Other Ambulatory Visit: Payer: Medicare Other | Admitting: Lab

## 2012-04-28 ENCOUNTER — Ambulatory Visit: Payer: Medicare Other

## 2012-04-28 ENCOUNTER — Other Ambulatory Visit (HOSPITAL_BASED_OUTPATIENT_CLINIC_OR_DEPARTMENT_OTHER): Payer: Medicare Other | Admitting: Lab

## 2012-04-28 ENCOUNTER — Ambulatory Visit (HOSPITAL_BASED_OUTPATIENT_CLINIC_OR_DEPARTMENT_OTHER): Payer: Medicare Other | Admitting: Hematology & Oncology

## 2012-04-28 VITALS — BP 128/70 | HR 85 | Temp 98.6°F | Resp 16 | Wt 156.0 lb

## 2012-04-28 DIAGNOSIS — D5700 Hb-SS disease with crisis, unspecified: Secondary | ICD-10-CM

## 2012-04-28 LAB — CBC WITH DIFFERENTIAL (CANCER CENTER ONLY)
BASO%: 0.6 % (ref 0.0–2.0)
EOS%: 1.7 % (ref 0.0–7.0)
Eosinophils Absolute: 0.2 10*3/uL (ref 0.0–0.5)
LYMPH%: 32.2 % (ref 14.0–48.0)
MCH: 30.5 pg (ref 28.0–33.4)
MCHC: 35.9 g/dL (ref 32.0–35.9)
MCV: 85 fL (ref 82–98)
MONO%: 10.9 % (ref 0.0–13.0)
Platelets: 317 10*3/uL (ref 145–400)
RDW: 19.5 % — ABNORMAL HIGH (ref 11.1–15.7)

## 2012-04-28 NOTE — Progress Notes (Signed)
CC:   [name], M.D.  DIAGNOSIS:  Hemoglobin SS disease.  CURRENT THERAPY: 1. Hydrea 500 mg p.o. b.i.d. 2. Folic acid 1 mg p.o. daily.  INTERIM HISTORY:  [name] comes in for followup.  Unfortunately, after we last saw him back in November, he was admitted because of a sickle cell crisis.  He had been admitted only 3 times last year total. He was hospitalized for 10 days, which is typical for him.  We did an extensive workup while he was in the hospital.  We did get an MRI of the right hip.  He is having a lot of pain in the right hip.  He has a new bone infarct in the femoral head.  However, there is no collapse of the femoral head.  I spoke to Orthopedic Surgery about this. They did not feel that he needed surgery for this.  He is feeling well right now.  The hip is not bothering him.  He is on OxyContin at 80 mg twice a day.  He is on oxycodone at 30 mg every 6 hours as needed.  This has helped him out.  He is trying to stay active.  He is going to go to [place] in May.  He still is planning to do this.  His appetite has been good.  He enjoyed Christmas.  He has had no fever.  He has had no cough or shortness of breath.  PHYSICAL EXAMINATION:  This is a well-developed, well-nourished black gentleman in no obvious distress.  Vital signs:  Temperature of 98.6, pulse 85, respiratory rate 15, blood pressure 128/70.  Weight is 156. Head/neck:  Normocephalic, atraumatic skull.  There are no ocular or oral lesions.  There are no palpable cervical or supraclavicular lymph nodes.  There is no scleral icterus.  Lungs:  Clear bilaterally. Cardiac:  Regular rate and rhythm with a normal S1 and S2.  There are no murmurs, rubs or bruits.  Abdomen:  Soft with good bowel sounds.  There is no palpable abdominal mass.  There is no fluid wave.  There is no palpable hepatosplenomegaly.  No tenderness over the spine, ribs, or hips.  Specifically, he has good range of motion of  the right hip with very little discomfort.  Extremities:  No clubbing, cyanosis or edema. Neurologic:  No focal neurological deficits.  LABORATORY STUDIES:  White cell count of 11.6, hemoglobin 11.7, hematocrit 32.6, platelet count is 317.  IMPRESSION:  [name] is a 32 year old gentleman with hemoglobin SS disease.  He did have a crisis back in November.  He was treated with an exchange transfusion.  When we last checked his hemoglobin electrophoresis back in December, he had 41% hemoglobin A and 47% hemoglobin S.  We are rechecking his hemoglobin electrophoresis today.  I want to try and keep his hemoglobin below 11.  I do not think we have to phlebotomize him today.  He still has a good amount of hemoglobin A, which is protective.  Of note, he has a good level of hemoglobin F.  This should be protective.  We are watching his iron studies.  His last ferritin was 1605 in December.  His iron saturation was 67%.  We are going to have to be careful with this.  Hopefully, we will not need to do iron chelation therapy.  I do want to see him back in a couple of months.    ______________________________ [name], M.D. PRE/MEDQ  D:  04/28/2012  T:  04/28/2012  Job:  [id number]

## 2012-04-28 NOTE — Progress Notes (Signed)
This office note has been dictated.

## 2012-04-28 NOTE — Progress Notes (Signed)
Patient seen by Dr. Ennever, no phlebotomy indicated. [name]  

## 2012-04-30 LAB — RETICULOCYTES (CHCC)
ABS Retic: 492.5 10*3/uL — ABNORMAL HIGH (ref 19.0–186.0)
RBC.: 3.94 MIL/uL — ABNORMAL LOW (ref 4.22–5.81)

## 2012-04-30 LAB — FERRITIN: Ferritin: 891 ng/mL — ABNORMAL HIGH (ref 22–322)

## 2012-04-30 LAB — IRON AND TIBC
%SAT: 57 % — ABNORMAL HIGH (ref 20–55)
Iron: 159 ug/dL (ref 42–165)
TIBC: 278 ug/dL (ref 215–435)

## 2012-05-09 ENCOUNTER — Telehealth: Payer: Self-pay | Admitting: Hematology & Oncology

## 2012-05-09 ENCOUNTER — Other Ambulatory Visit: Payer: Self-pay | Admitting: *Deleted

## 2012-05-09 DIAGNOSIS — D5700 Hb-SS disease with crisis, unspecified: Secondary | ICD-10-CM

## 2012-05-09 DIAGNOSIS — R52 Pain, unspecified: Secondary | ICD-10-CM

## 2012-05-09 MED ORDER — OXYCODONE HCL 80 MG PO TB12: 80.0000 mg | ORAL_TABLET | Freq: Two times a day (BID) | ORAL | Status: DC

## 2012-05-09 MED ORDER — OXYCODONE HCL 30 MG PO TABS: 30.0000 mg | ORAL_TABLET | Freq: Four times a day (QID) | ORAL | Status: DC | PRN

## 2012-05-09 NOTE — Telephone Encounter (Addendum)
Pt called this morning requesting rx for Oxycontin and Roxicodone. He last had his narcotics filled on 04/10/12. Rx's were routed to Dr Myna Hidalgo to sign but he was unable to get to approve it on his computer. Printed off, he signed the rx then placed at the front desk for him to pick up.

## 2012-05-09 NOTE — Telephone Encounter (Signed)
Called pt to give new time for 3-17 appointment he rescheduled for 3-24

## 2012-06-09 ENCOUNTER — Telehealth: Payer: Self-pay | Admitting: *Deleted

## 2012-06-09 ENCOUNTER — Other Ambulatory Visit: Payer: Self-pay | Admitting: Medical

## 2012-06-09 ENCOUNTER — Telehealth: Payer: Self-pay | Admitting: Oncology

## 2012-06-09 DIAGNOSIS — D5700 Hb-SS disease with crisis, unspecified: Secondary | ICD-10-CM

## 2012-06-09 DIAGNOSIS — R52 Pain, unspecified: Secondary | ICD-10-CM

## 2012-06-09 MED ORDER — OXYCODONE HCL 30 MG PO TABS: 30.0000 mg | ORAL_TABLET | Freq: Four times a day (QID) | ORAL | Status: DC | PRN

## 2012-06-09 MED ORDER — OXYCODONE HCL 80 MG PO TB12: 80.0000 mg | ORAL_TABLET | Freq: Two times a day (BID) | ORAL | Status: DC

## 2012-06-23 ENCOUNTER — Ambulatory Visit: Payer: Medicare Other | Admitting: Hematology & Oncology

## 2012-06-23 ENCOUNTER — Other Ambulatory Visit: Payer: Medicare Other | Admitting: Lab

## 2012-06-30 ENCOUNTER — Other Ambulatory Visit: Payer: Medicare Other | Admitting: Lab

## 2012-06-30 ENCOUNTER — Ambulatory Visit: Payer: Medicare Other | Admitting: Hematology & Oncology

## 2012-07-01 ENCOUNTER — Other Ambulatory Visit (HOSPITAL_BASED_OUTPATIENT_CLINIC_OR_DEPARTMENT_OTHER): Payer: Medicare Other | Admitting: Lab

## 2012-07-01 ENCOUNTER — Ambulatory Visit (HOSPITAL_BASED_OUTPATIENT_CLINIC_OR_DEPARTMENT_OTHER): Payer: Medicare Other | Admitting: Medical

## 2012-07-01 ENCOUNTER — Ambulatory Visit: Payer: Medicare Other

## 2012-07-01 VITALS — BP 112/63 | HR 76 | Temp 99.2°F | Resp 18 | Ht 66.0 in | Wt 160.0 lb

## 2012-07-01 DIAGNOSIS — D5700 Hb-SS disease with crisis, unspecified: Secondary | ICD-10-CM

## 2012-07-01 DIAGNOSIS — D571 Sickle-cell disease without crisis: Secondary | ICD-10-CM

## 2012-07-01 LAB — MANUAL DIFFERENTIAL (CHCC SATELLITE)
ALC: 3.3 10*3/uL (ref 0.9–3.3)
ANC (CHCC HP manual diff): 5.7 10*3/uL (ref 1.5–6.5)
MONO: 5 % (ref 0–13)
PLT EST ~~LOC~~: ADEQUATE

## 2012-07-01 LAB — CBC WITH DIFFERENTIAL (CANCER CENTER ONLY)
HGB: 11.4 g/dL — ABNORMAL LOW (ref 13.0–17.1)
MCHC: 36.9 g/dL — ABNORMAL HIGH (ref 32.0–35.9)
Platelets: 298 10*3/uL (ref 145–400)
RDW: 23.7 % — ABNORMAL HIGH (ref 11.1–15.7)

## 2012-07-01 NOTE — Progress Notes (Signed)
No phlebotomy needed per Eunice Blase P.A

## 2012-07-01 NOTE — Progress Notes (Signed)
Diagnosis: Hemoglobin, SS.  Disease.  Current therapy: #1 Hydrea, 500 mg by mouth twice a day. #2 folic acid 1 mg by mouth daily.  Interim history:  [name] presents today for an office followup visit.  Overall, [name], reports she's been doing relatively well.  [name] was hospitalized back in November for sickle cell crisis.Marland Kitchen  [name] did develop an infarct in the right femoral head.  Orthopedic surgery did not feel that [name] needed a hip replacement at that time.  [name] did have an exchange transfusion while in the hospital.  [name] is on chronic OxyContin 80 mg twice a day.  [name] is also on oxycodone 30 mg every 6 hours as needed.  This seems to help with any pain.  Unfortunately, [name] had planned to go out of the country to [place], however, secondary to his sickle cell crisis it was not safe for him to travel for at least 2-3 months.  [name] plans to go in may.  His last .  Hemoglobin, electrophoresis, revealed  hemoglobin, A, 24%, and hemoglobin S, 65%.  His last ferritin was 891.  Iron 159, with 57% saturation.  The studies were done back in January.  [name], reports, that [name] has a good appetite.  [name] denies any nausea, vomiting, diarrhea, constipation, chest pain, shortness of breath, or cough.  [name] denies any lower leg swelling.  [name] denies any abdominal pain.  [name] denies any back pain.  [name] denies any obvious, or abnormal bleeding.  [name] denies any fevers, chills, or night sweats.  [name] denies any headaches, visual changes, or rashes.    Review of Systems: Constitutional:Negative for malaise/fatigue, fever, chills, weight loss, diaphoresis, activity change, appetite change, and unexpected weight change.  HEENT: Negative for double vision, blurred vision, visual loss, ear pain, tinnitus, congestion, rhinorrhea, epistaxis sore throat or sinus disease, oral pain/lesion, tongue soreness Respiratory: Negative for cough, chest tightness, shortness of breath, wheezing and stridor.  Cardiovascular: Negative for chest pain, palpitations, leg  swelling, orthopnea, PND, DOE or claudication Gastrointestinal: Negative for nausea, vomiting, abdominal pain, diarrhea, constipation, blood in stool, melena, hematochezia, abdominal distention, anal bleeding, rectal pain, anorexia and hematemesis.  Genitourinary: Negative for dysuria, frequency, hematuria,  Musculoskeletal: Negative for myalgias, back pain, joint swelling, arthralgias and gait problem.  Skin: Negative for rash, color change, pallor and wound.  Neurological:. Negative for dizziness/light-headedness, tremors, seizures, syncope, facial asymmetry, speech difficulty, weakness, numbness, headaches and paresthesias.  Hematological: Negative for adenopathy. Does not bruise/bleed easily.  Psychiatric/Behavioral:  Negative for depression, no loss of interest in normal activity or change in sleep pattern.   Physical Exam: This is a pleasant, 32 year old, well-developed, well-nourished, African American, [name], in no obvious distress Vitals:  temperature 99.2 degrees, pulse 76, respirations 18, blood pressure 112/63, weight 160 pounds  HEENT reveals a normocephalic, atraumatic skull, no scleral icterus, no oral lesions  Neck is supple without any cervical or supraclavicular adenopathy.  Lungs are clear to auscultation bilaterally. There are no wheezes, rales or rhonci Cardiac is regular rate and rhythm with a normal S1 and S2. There are no murmurs, rubs, or bruits.  Abdomen is soft with good bowel sounds, there is no palpable mass. There is no palpable hepatosplenomegaly. There is no palpable fluid wave.  Musculoskeletal no tenderness of the spine, ribs, or hips.  Extremities there are no clubbing, cyanosis, or edema.  Skin no petechia, purpura or ecchymosis Neurologic is nonfocal.  Laboratory Data:  white count 10.0, hemoglobin 11.4, hematocrit 30.9, platelets 298,000  Current Outpatient Prescriptions on File Prior to Visit  Medication Sig Dispense Refill  . ALPRAZolam (XANAX) 1  MG tablet 1 mg 3 (three) times daily as needed.       . Cyanocobalamin (VITAMIN B 12 PO) Take 1 tablet by mouth.      . folic acid (FOLVITE) 1 MG tablet Take 1 mg by mouth daily.        . hydroxyurea (HYDREA) 500 MG capsule Take 2 capsules (1,000 mg total) by mouth daily. May take with food to minimize GI side effects.  60 capsule  2  . Multiple Vitamin (MULITIVITAMIN WITH MINERALS) TABS Take 1 tablet by mouth daily.      Marland Kitchen oxyCODONE (OXYCONTIN) 80 MG 12 hr tablet Take 1 tablet (80 mg total) by mouth every 12 (twelve) hours.  60 tablet  0  . oxycodone (ROXICODONE) 30 MG immediate release tablet Take 1 tablet (30 mg total) by mouth every 6 (six) hours as needed.  120 tablet  0   No current facility-administered medications on file prior to visit.   Assessment/Plan: This is a pleasant, 32 year old, African American, male, with the following issues:  #1, hemoglobin, SS disease.  [name] is doing well with this.  His last crisis was back in November.  [name] did have to hold off his travel plans to [place].  [name] has plans to travel in May.  I do not see that we have to phlebotomize him right now. we will continue to monitor him closely  #2.  Pain management.  [name] does remain on OxyContin and oxycodone.  [name] is on a narcotic contract with [place].  #3.  Followup.  We will follow back up with [name] in two months, but before then should there be questions or concerns.

## 2012-07-03 LAB — IRON AND TIBC
%SAT: 61 % — ABNORMAL HIGH (ref 20–55)
Iron: 168 ug/dL — ABNORMAL HIGH (ref 42–165)
TIBC: 274 ug/dL (ref 215–435)
UIBC: 106 ug/dL — ABNORMAL LOW (ref 125–400)

## 2012-07-03 LAB — RETICULOCYTES (CHCC)
ABS Retic: 640.8 10*3/uL — ABNORMAL HIGH (ref 19.0–186.0)
RBC.: 3.58 MIL/uL — ABNORMAL LOW (ref 4.22–5.81)
Retic Ct Pct: 17.9 % — ABNORMAL HIGH (ref 0.4–2.3)

## 2012-07-10 ENCOUNTER — Other Ambulatory Visit: Payer: Self-pay | Admitting: *Deleted

## 2012-07-10 DIAGNOSIS — D5700 Hb-SS disease with crisis, unspecified: Secondary | ICD-10-CM

## 2012-07-10 DIAGNOSIS — R52 Pain, unspecified: Secondary | ICD-10-CM

## 2012-07-10 MED ORDER — OXYCODONE HCL 80 MG PO TB12: 80.0000 mg | ORAL_TABLET | Freq: Two times a day (BID) | ORAL | Status: DC

## 2012-07-10 MED ORDER — OXYCODONE HCL 30 MG PO TABS: 30.0000 mg | ORAL_TABLET | Freq: Four times a day (QID) | ORAL | Status: DC | PRN

## 2012-07-17 ENCOUNTER — Encounter: Payer: Self-pay | Admitting: Hematology & Oncology

## 2012-07-17 NOTE — Progress Notes (Signed)
Received Approval Entry Confirmation from [facility] provider portal for the [facility], confirmation #: [id number] W Status: Approved, Prior Approval #[id number]. Forward to medical records to attached to file.

## 2012-08-08 ENCOUNTER — Other Ambulatory Visit: Payer: Self-pay | Admitting: *Deleted

## 2012-08-08 DIAGNOSIS — R52 Pain, unspecified: Secondary | ICD-10-CM

## 2012-08-08 DIAGNOSIS — D5700 Hb-SS disease with crisis, unspecified: Secondary | ICD-10-CM

## 2012-08-08 MED ORDER — OXYCODONE HCL 80 MG PO TB12: 80.0000 mg | ORAL_TABLET | Freq: Two times a day (BID) | ORAL | Status: DC

## 2012-08-08 MED ORDER — OXYCODONE HCL 30 MG PO TABS: 30.0000 mg | ORAL_TABLET | Freq: Four times a day (QID) | ORAL | Status: DC | PRN

## 2012-08-08 NOTE — Telephone Encounter (Signed)
Pt called this morning requesting rx for Oxycontin and Roxicodone. He last had his narcotics filled on 07/10/12. Rx's routed to Dr Myna Hidalgo for approval. Will place at the front desk for pick up after signed.

## 2012-08-21 ENCOUNTER — Other Ambulatory Visit (HOSPITAL_BASED_OUTPATIENT_CLINIC_OR_DEPARTMENT_OTHER): Payer: Medicare Other | Admitting: Lab

## 2012-08-21 ENCOUNTER — Ambulatory Visit: Payer: Medicare Other

## 2012-08-21 ENCOUNTER — Ambulatory Visit (HOSPITAL_BASED_OUTPATIENT_CLINIC_OR_DEPARTMENT_OTHER): Payer: Medicare Other | Admitting: Hematology & Oncology

## 2012-08-21 VITALS — BP 116/61 | HR 79 | Temp 98.6°F | Resp 18 | Ht 66.0 in | Wt 162.0 lb

## 2012-08-21 DIAGNOSIS — R52 Pain, unspecified: Secondary | ICD-10-CM

## 2012-08-21 DIAGNOSIS — D5700 Hb-SS disease with crisis, unspecified: Secondary | ICD-10-CM

## 2012-08-21 LAB — CBC WITH DIFFERENTIAL (CANCER CENTER ONLY)
BASO#: 0.1 10*3/uL (ref 0.0–0.2)
BASO%: 0.8 % (ref 0.0–2.0)
EOS%: 3.7 % (ref 0.0–7.0)
HGB: 11.2 g/dL — ABNORMAL LOW (ref 13.0–17.1)
LYMPH#: 3.6 10*3/uL — ABNORMAL HIGH (ref 0.9–3.3)
MCHC: 36.4 g/dL — ABNORMAL HIGH (ref 32.0–35.9)
NEUT#: 5.5 10*3/uL (ref 1.5–6.5)
Platelets: 291 10*3/uL (ref 145–400)

## 2012-08-21 MED ORDER — OXYCODONE HCL 30 MG PO TABS: 30.0000 mg | ORAL_TABLET | Freq: Four times a day (QID) | ORAL | Status: DC | PRN

## 2012-08-21 MED ORDER — OXYCODONE HCL 80 MG PO TB12: 80.0000 mg | ORAL_TABLET | Freq: Two times a day (BID) | ORAL | Status: DC

## 2012-08-21 NOTE — Progress Notes (Signed)
No phlebotomy today per dr. ennever 

## 2012-08-21 NOTE — Progress Notes (Signed)
This office note has been dictated.

## 2012-08-22 NOTE — Progress Notes (Signed)
DIAGNOSIS:  Hemoglobin SS disease.  CURRENT THERAPY: 1. Folic acid 1 mg p.o. daily. 2. Hydrea 500 mg p.o. b.i.d.  INTERIM HISTORY:  Mr. Goodchild comes in for his followup.  He is doing quite well.  His last hospitalization was I think back in November.  He has had no crises.  He is on OxyContin and oxycodone.  I last saw him myself back in January.  He has done well since then. There are no issues with iron overload.  His ferritin back in March was 923 with an iron saturation of 61%.  He has had no fevers.  He has had no cough.  He has had no change in bowel or bladder habits.  He has not notice any kind of ulcerations in his legs.  PHYSICAL EXAMINATION:  General:  This is a well-developed, well- nourished black gentleman in no obvious distress.  Vital signs:  Show temperature of 98.6, pulse 79, respiratory rate 18, blood pressure 116/61.  Weight is 162.  Head and neck:  Shows a normocephalic, atraumatic skull.  There are no ocular or oral lesions.  He has no palpable cervical or supraclavicular lymph nodes.  Lungs:  Clear bilaterally.  Cardiac:  Regular rate and rhythm with a normal S1 and S2. There are no murmurs, rubs or bruits.  Abdomen:  Soft with good bowel sounds.  There is no palpable abdominal mass.  There is no fluid wave. There is no palpable hepatosplenomegaly.  Back:  No tenderness over the spine, ribs or hips.  Extremities:  Show no clubbing, cyanosis or edema. Neurological:  Shows no focal neurological deficits.  LABORATORY STUDIES:  White cell count is 10.7, hemoglobin 11.2, hematocrit 30.8, platelet count is 291.  IMPRESSION:  Mr. 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  He is doing fairly well right now.  We will follow him up in about 2 months' time.  We do not need to do any kind of exchange transfusion on him right now.  He has a plan to go to [place] sometime in June he feels.  We can certainly see him back before or after his  trip.    ______________________________ Josph Macho, M.D. PRE/MEDQ  D:  08/21/2012  T:  08/22/2012  Job:  [id number]

## 2012-08-25 LAB — COMPREHENSIVE METABOLIC PANEL
ALT: 16 U/L (ref 0–53)
AST: 40 U/L — ABNORMAL HIGH (ref 0–37)
Albumin: 4.5 g/dL (ref 3.5–5.2)
Alkaline Phosphatase: 88 U/L (ref 39–117)
BUN: 12 mg/dL (ref 6–23)
Calcium: 9.4 mg/dL (ref 8.4–10.5)
Chloride: 109 mEq/L (ref 96–112)
Potassium: 4.3 mEq/L (ref 3.5–5.3)
Sodium: 140 mEq/L (ref 135–145)
Total Protein: 7.1 g/dL (ref 6.0–8.3)

## 2012-08-25 LAB — IRON AND TIBC
%SAT: 45 % (ref 20–55)
Iron: 121 ug/dL (ref 42–165)
UIBC: 149 ug/dL (ref 125–400)

## 2012-08-25 LAB — RETICULOCYTES (CHCC): Retic Ct Pct: 16 % — ABNORMAL HIGH (ref 0.4–2.3)

## 2012-08-25 LAB — FERRITIN: Ferritin: 311 ng/mL (ref 22–322)

## 2012-09-02 ENCOUNTER — Other Ambulatory Visit: Payer: Medicare Other | Admitting: Lab

## 2012-09-02 ENCOUNTER — Ambulatory Visit: Payer: Medicare Other | Admitting: Hematology & Oncology

## 2012-10-09 ENCOUNTER — Other Ambulatory Visit: Payer: Self-pay | Admitting: *Deleted

## 2012-10-09 DIAGNOSIS — D5700 Hb-SS disease with crisis, unspecified: Secondary | ICD-10-CM

## 2012-10-09 MED ORDER — OXYCODONE HCL 30 MG PO TABS: 30.0000 mg | ORAL_TABLET | Freq: Four times a day (QID) | ORAL | Status: DC | PRN

## 2012-10-09 MED ORDER — OXYCODONE HCL 80 MG PO TB12: 80.0000 mg | ORAL_TABLET | Freq: Two times a day (BID) | ORAL | Status: DC

## 2012-10-09 NOTE — Telephone Encounter (Signed)
Pt called this morning requesting rx for Oxycontin and Roxicodone. He last had his narcotics filled on 09/08/12. Rx's routed to Dr Myna Hidalgo for approval. Will place at the front desk for pick up after signed.

## 2012-10-23 ENCOUNTER — Ambulatory Visit (HOSPITAL_BASED_OUTPATIENT_CLINIC_OR_DEPARTMENT_OTHER): Payer: Medicare Other | Admitting: Hematology & Oncology

## 2012-10-23 ENCOUNTER — Other Ambulatory Visit (HOSPITAL_BASED_OUTPATIENT_CLINIC_OR_DEPARTMENT_OTHER): Payer: Medicare Other | Admitting: Lab

## 2012-10-23 VITALS — BP 118/63 | HR 90 | Temp 98.8°F | Resp 18 | Ht 66.0 in | Wt 162.0 lb

## 2012-10-23 DIAGNOSIS — D5700 Hb-SS disease with crisis, unspecified: Secondary | ICD-10-CM

## 2012-10-23 DIAGNOSIS — R52 Pain, unspecified: Secondary | ICD-10-CM

## 2012-10-23 LAB — FERRITIN CHCC: Ferritin: 333 ng/ml — ABNORMAL HIGH (ref 22–316)

## 2012-10-23 LAB — CBC WITH DIFFERENTIAL (CANCER CENTER ONLY)
Eosinophils Absolute: 0.1 10*3/uL (ref 0.0–0.5)
HCT: 31.3 % — ABNORMAL LOW (ref 38.7–49.9)
LYMPH%: 34.8 % (ref 14.0–48.0)
MCV: 86 fL (ref 82–98)
MONO#: 0.8 10*3/uL (ref 0.1–0.9)
NEUT%: 54.3 % (ref 40.0–80.0)
Platelets: 356 10*3/uL (ref 145–400)
RBC: 3.63 10*6/uL — ABNORMAL LOW (ref 4.20–5.70)
WBC: 8.2 10*3/uL (ref 4.0–10.0)

## 2012-10-23 LAB — IRON AND TIBC CHCC: %SAT: 43 % (ref 20–55)

## 2012-10-23 NOTE — Progress Notes (Signed)
This office note has been dictated.

## 2012-10-27 LAB — HEMOGLOBINOPATHY EVALUATION
Hemoglobin Other: 0 %
Hgb A2 Quant: 3.1 % (ref 2.2–3.2)
Hgb F Quant: 13.8 % — ABNORMAL HIGH (ref 0.0–2.0)
Hgb S Quant: 83.1 % — ABNORMAL HIGH

## 2012-10-27 NOTE — Progress Notes (Signed)
CC:   [name], M.D.  DIAGNOSIS:  Hemoglobin SS disease.  CURRENT THERAPY: 1. Folic acid 1 mg p.o. q. day. 2. Hydrea 500 mg p.o. b.i.d.  INTERIM HISTORY:  [name] comes in for his followup.  He had a great time in [place].  He went in June.  He was there for about 3 weeks. He, thankfully, did not get sick.  He did not have any crises.  He enjoyed himself.  He saw his family.  His sickle cell is doing quite well.  He has not had any problems with crises.  I do not think he has been hospitalized now probably for about 9 or 10 months.  We are watching his iron studies.  Back in May, his ferritin was only 311, with an iron saturation of 45%.  He has been drinking quite a bit of fluid.  He has been active.  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  PHYSICAL EXAMINATION:  General:  This is a well-developed, well- nourished black gentleman in no obvious distress.  Vital signs: Temperature of 98.8, pulse 90, respiratory rate 18, blood pressure 118/63.  Weight is 162.  Head and neck:  Normocephalic, atraumatic skull.  There are no ocular or oral lesions.  There are no palpable cervical or supraclavicular lymph nodes.  Lungs:  Clear bilaterally. Cardiac:  Regular rate and rhythm with a normal S1, S2.  There are no murmurs, rubs, or bruits.  Abdomen:  Soft with good bowel sounds.  There is no palpable abdominal mass.  There is no fluid wave.  There is no palpable hepatosplenomegaly.  Extremities:  No clubbing, cyanosis or edema.  He has good range of motion of his joints.  Neurological:  No focal neurological deficits.  Skin:  No rashes or ulcerations.  LABORATORY STUDIES:  White cell count 8.2, hemoglobin 11.4, hematocrit 31.3, platelet count 356.  MCV is 86.  IMPRESSION:  [name] is a really nice 32 year old African American gentleman with hemoglobin SS disease.  He is doing well with  this.  We can hold off on IV fluids or phlebotomy for right now.  If necessary, we can always do an exchange on him if he has any problems with crises.  We will plan for another 2 month followup.    ______________________________ [name], M.D. PRE/MEDQ  D:  10/23/2012  T:  10/24/2012  Job:  [id number]

## 2012-11-10 ENCOUNTER — Other Ambulatory Visit: Payer: Self-pay | Admitting: *Deleted

## 2012-11-10 DIAGNOSIS — D5700 Hb-SS disease with crisis, unspecified: Secondary | ICD-10-CM

## 2012-11-10 MED ORDER — OXYCODONE HCL 80 MG PO TB12: 80.0000 mg | ORAL_TABLET | Freq: Two times a day (BID) | ORAL | Status: DC

## 2012-11-10 MED ORDER — OXYCODONE HCL 30 MG PO TABS: 30.0000 mg | ORAL_TABLET | Freq: Four times a day (QID) | ORAL | Status: DC | PRN

## 2012-11-10 NOTE — Telephone Encounter (Signed)
Pt called this morning requesting rx for Oxycontin and Roxicodone. He last had his narcotics filled on 10/09/12. Rx's routed to Dr Myna Hidalgo for approval. Will place at the front desk for pick up after signed.

## 2012-12-10 ENCOUNTER — Other Ambulatory Visit: Payer: Self-pay | Admitting: *Deleted

## 2012-12-10 DIAGNOSIS — D5700 Hb-SS disease with crisis, unspecified: Secondary | ICD-10-CM

## 2012-12-10 MED ORDER — OXYCODONE HCL 80 MG PO TB12: 80.0000 mg | ORAL_TABLET | Freq: Two times a day (BID) | ORAL | Status: DC

## 2012-12-10 MED ORDER — OXYCODONE HCL 30 MG PO TABS: 30.0000 mg | ORAL_TABLET | Freq: Four times a day (QID) | ORAL | Status: DC | PRN

## 2012-12-10 NOTE — Telephone Encounter (Signed)
Pt called several times today asking to pick up his rx for tomorrow. After the the 2nd or 3rd call, he was asked if he was close to being out of his medication and he stated "yes, on Thurs". Will obtain route rx to Dr Myna Hidalgo to sign off and place at the front desk for pick up.

## 2012-12-25 ENCOUNTER — Ambulatory Visit (HOSPITAL_BASED_OUTPATIENT_CLINIC_OR_DEPARTMENT_OTHER): Payer: Medicare Other | Admitting: Hematology & Oncology

## 2012-12-25 ENCOUNTER — Ambulatory Visit (HOSPITAL_BASED_OUTPATIENT_CLINIC_OR_DEPARTMENT_OTHER): Payer: Medicare Other

## 2012-12-25 ENCOUNTER — Other Ambulatory Visit (HOSPITAL_BASED_OUTPATIENT_CLINIC_OR_DEPARTMENT_OTHER): Payer: Medicare Other | Admitting: Lab

## 2012-12-25 VITALS — BP 119/59 | HR 80 | Temp 98.7°F | Resp 16 | Ht 67.0 in | Wt 163.0 lb

## 2012-12-25 DIAGNOSIS — D5700 Hb-SS disease with crisis, unspecified: Secondary | ICD-10-CM

## 2012-12-25 DIAGNOSIS — J189 Pneumonia, unspecified organism: Secondary | ICD-10-CM

## 2012-12-25 DIAGNOSIS — Z23 Encounter for immunization: Secondary | ICD-10-CM

## 2012-12-25 LAB — CBC WITH DIFFERENTIAL (CANCER CENTER ONLY)
BASO#: 0.1 10*3/uL (ref 0.0–0.2)
BASO%: 0.9 % (ref 0.0–2.0)
EOS%: 3 % (ref 0.0–7.0)
HCT: 28.6 % — ABNORMAL LOW (ref 38.7–49.9)
LYMPH%: 36.5 % (ref 14.0–48.0)
MCH: 31.6 pg (ref 28.0–33.4)
MCHC: 36.4 g/dL — ABNORMAL HIGH (ref 32.0–35.9)
MCV: 87 fL (ref 82–98)
NEUT%: 47 % (ref 40.0–80.0)
RDW: 22.2 % — ABNORMAL HIGH (ref 11.1–15.7)

## 2012-12-25 LAB — IRON AND TIBC CHCC
Iron: 201 ug/dL — ABNORMAL HIGH (ref 42–163)
TIBC: 259 ug/dL (ref 202–409)
UIBC: 57 ug/dL — ABNORMAL LOW (ref 117–376)

## 2012-12-25 MED ORDER — INFLUENZA VAC SPLIT QUAD 0.5 ML IM SUSP
0.5000 mL | Freq: Once | INTRAMUSCULAR | Status: AC
  Administered 2012-12-25: 0.5 mL via INTRAMUSCULAR
  Filled 2012-12-25: qty 0.5

## 2012-12-25 MED ORDER — PNEUMOCOCCAL VAC POLYVALENT 25 MCG/0.5ML IJ INJ
0.5000 mL | INJECTION | Freq: Once | INTRAMUSCULAR | Status: AC
  Administered 2012-12-25: 0.5 mL via INTRAMUSCULAR
  Filled 2012-12-25: qty 0.5

## 2012-12-25 MED ORDER — PROMETHAZINE HCL 25 MG PO TABS: 25.0000 mg | ORAL_TABLET | Freq: Four times a day (QID) | ORAL | Status: DC | PRN

## 2012-12-25 NOTE — Patient Instructions (Signed)

## 2012-12-25 NOTE — Progress Notes (Signed)
This office note has been dictated.

## 2012-12-29 LAB — RETICULOCYTES (CHCC)
RBC.: 3.41 MIL/uL — ABNORMAL LOW (ref 4.22–5.81)
Retic Ct Pct: 18.46 % — ABNORMAL HIGH (ref 0.4–2.3)

## 2013-01-01 ENCOUNTER — Other Ambulatory Visit: Payer: Self-pay | Admitting: *Deleted

## 2013-01-06 NOTE — Progress Notes (Signed)
DIAGNOSIS:  Hemoglobin SS disease.  CURRENT THERAPY: 1. Folic acid 1 mg p.o. daily. 2. Hydrea 500 mg p.o. b.i.d.  INTERIM HISTORY:  [name] comes in for his followup.  He is doing fairly well.  He does have a little nausea.  This has been going on for a couple of weeks.  We will go ahead and give him a little Phenergan to take if he needs it.  He has had no real abdominal pain.  He has had no sickle cell crises. He has had no cough or shortness of breath.  There has been no change in bowel or bladder habits.  There has been no diarrhea.  He has had no fever.  There has been no leg swelling.  When we last saw him in July, his ferritin was 333 with an iron saturation of 43%.  PHYSICAL EXAMINATION:  General:  This is a well-developed, well- nourished African American gentleman in no obvious distress.  Vital signs:  Temperature of 98.7, pulse 80, respiratory rate 16, blood pressure 119/59.  Weight is 163.  Head and neck:  Normocephalic, atraumatic skull.  There are no ocular or oral lesions.  There are no palpable cervical or supraclavicular lymph nodes.  Lungs:  Clear bilaterally.  Cardiac:  Regular rate and rhythm with a normal S1 and S2. There are no murmurs, rubs or bruits.  Abdomen:  Soft.  He has good bowel sounds.  There is no fluid wave.  There is no palpable hepatosplenomegaly.  Extremities:  No clubbing, cyanosis or edema.  He has good range of motion of his joints and has good muscle strength in the upper and lower extremities.  Skin:  No rashes, ecchymosis, or petechia.  There are no ulcerations in his lower legs.  Neurologic:  No focal neurological deficits.  LABORATORY STUDIES:  White cell count is 7.1, hemoglobin 10.4, hematocrit 28.6, platelet count 286.  MCV is 87.  IMPRESSION:  [name] is a 32 year old African American gentleman with hemoglobin SS disease.  So far, he has been doing fairly well.  He has been out of the hospital now, I think, for about 8  months or so.  He is on OxyContin and oxycodone, which have helped him quite a bit.  He is very functional.  I do not think we have to phlebotomize him right now.  We will go ahead and see if the Phenergan can help with this nausea that he has.  I am not sure as to why he has the nausea.  He certainly does not have a gallbladder.  There is no evidence of iron overload.  It may be a viral syndrome that he could have had.  We will go ahead and plan to get him back in 2 more months.  I do not see that we need any blood work in between visits.    ______________________________ Josph Macho, M.D. PRE/MEDQ  D:  12/25/2012  T:  01/06/2013  Job:  [id number]

## 2013-01-07 ENCOUNTER — Telehealth: Payer: Self-pay | Admitting: *Deleted

## 2013-01-07 DIAGNOSIS — R5381 Other malaise: Secondary | ICD-10-CM

## 2013-01-07 DIAGNOSIS — D5700 Hb-SS disease with crisis, unspecified: Secondary | ICD-10-CM

## 2013-01-07 NOTE — Telephone Encounter (Signed)
Pt called stating he was feeling lethargic and wanted to know if he could come in to be checked out. Asked if it has started after he took the Compazine and stated he didn't think it was that. Will have him come in tomorrow for labs and IVFs as he wants to prevent another hospitalization.

## 2013-01-08 ENCOUNTER — Ambulatory Visit: Payer: Medicare Other

## 2013-01-08 ENCOUNTER — Other Ambulatory Visit: Payer: Self-pay | Admitting: *Deleted

## 2013-01-08 ENCOUNTER — Other Ambulatory Visit: Payer: Medicare Other | Admitting: Lab

## 2013-01-08 DIAGNOSIS — D5700 Hb-SS disease with crisis, unspecified: Secondary | ICD-10-CM

## 2013-01-08 MED ORDER — OXYCODONE HCL ER 80 MG PO T12A: 80.0000 mg | EXTENDED_RELEASE_TABLET | Freq: Two times a day (BID) | ORAL | Status: DC

## 2013-01-08 MED ORDER — OXYCODONE HCL 30 MG PO TABS: 30.0000 mg | ORAL_TABLET | Freq: Four times a day (QID) | ORAL | Status: DC | PRN

## 2013-01-08 NOTE — Telephone Encounter (Signed)
Pt called declining IV fluids today because he feels better but needs to come in to pick up his pain medication. Will route oxycodone and OxyContin refills to Dr Myna Hidalgo for approval & signature then place at the front desk for pick up tomorrow.

## 2013-01-22 ENCOUNTER — Inpatient Hospital Stay (HOSPITAL_COMMUNITY)
Admission: EM | Admit: 2013-01-22 | Discharge: 2013-01-27 | DRG: 811 | Disposition: A | Discharge status: Other Acute Inpatient Hospital | Payer: Medicare Other | Attending: Internal Medicine | Admitting: Internal Medicine

## 2013-01-22 ENCOUNTER — Encounter (HOSPITAL_COMMUNITY): Payer: Self-pay | Admitting: Emergency Medicine

## 2013-01-22 DIAGNOSIS — F29 Unspecified psychosis not due to a substance or known physiological condition: Secondary | ICD-10-CM | POA: Diagnosis present

## 2013-01-22 DIAGNOSIS — J9600 Acute respiratory failure, unspecified whether with hypoxia or hypercapnia: Secondary | ICD-10-CM | POA: Diagnosis not present

## 2013-01-22 DIAGNOSIS — J9601 Acute respiratory failure with hypoxia: Secondary | ICD-10-CM | POA: Diagnosis present

## 2013-01-22 DIAGNOSIS — R141 Gas pain: Secondary | ICD-10-CM | POA: Diagnosis present

## 2013-01-22 DIAGNOSIS — G92 Toxic encephalopathy: Secondary | ICD-10-CM | POA: Diagnosis present

## 2013-01-22 DIAGNOSIS — R509 Fever, unspecified: Secondary | ICD-10-CM | POA: Diagnosis present

## 2013-01-22 DIAGNOSIS — T40605A Adverse effect of unspecified narcotics, initial encounter: Secondary | ICD-10-CM | POA: Diagnosis not present

## 2013-01-22 DIAGNOSIS — R410 Disorientation, unspecified: Secondary | ICD-10-CM | POA: Diagnosis present

## 2013-01-22 DIAGNOSIS — J9602 Acute respiratory failure with hypercapnia: Secondary | ICD-10-CM

## 2013-01-22 DIAGNOSIS — L299 Pruritus, unspecified: Secondary | ICD-10-CM | POA: Diagnosis present

## 2013-01-22 DIAGNOSIS — J811 Chronic pulmonary edema: Secondary | ICD-10-CM | POA: Diagnosis present

## 2013-01-22 DIAGNOSIS — R52 Pain, unspecified: Secondary | ICD-10-CM | POA: Diagnosis present

## 2013-01-22 DIAGNOSIS — D72829 Elevated white blood cell count, unspecified: Secondary | ICD-10-CM | POA: Diagnosis present

## 2013-01-22 DIAGNOSIS — Z9089 Acquired absence of other organs: Secondary | ICD-10-CM

## 2013-01-22 DIAGNOSIS — K5900 Constipation, unspecified: Secondary | ICD-10-CM | POA: Diagnosis present

## 2013-01-22 DIAGNOSIS — R142 Eructation: Secondary | ICD-10-CM | POA: Diagnosis present

## 2013-01-22 DIAGNOSIS — G929 Unspecified toxic encephalopathy: Secondary | ICD-10-CM | POA: Diagnosis present

## 2013-01-22 DIAGNOSIS — D5700 Hb-SS disease with crisis, unspecified: Secondary | ICD-10-CM | POA: Diagnosis present

## 2013-01-22 DIAGNOSIS — E860 Dehydration: Secondary | ICD-10-CM | POA: Diagnosis present

## 2013-01-22 LAB — CBC WITH DIFFERENTIAL/PLATELET
Basophils Relative: 1 % (ref 0–1)
Eosinophils Absolute: 0.2 10*3/uL (ref 0.0–0.7)
HCT: 27.2 % — ABNORMAL LOW (ref 39.0–52.0)
Hemoglobin: 9.9 g/dL — ABNORMAL LOW (ref 13.0–17.0)
Lymphocytes Relative: 39 % (ref 12–46)
Lymphs Abs: 4.7 10*3/uL — ABNORMAL HIGH (ref 0.7–4.0)
MCH: 31 pg (ref 26.0–34.0)
MCHC: 36.4 g/dL — ABNORMAL HIGH (ref 30.0–36.0)
MCV: 85.3 fL (ref 78.0–100.0)
Monocytes Absolute: 1.3 10*3/uL — ABNORMAL HIGH (ref 0.1–1.0)
Neutro Abs: 5.7 10*3/uL (ref 1.7–7.7)

## 2013-01-22 LAB — COMPREHENSIVE METABOLIC PANEL
Albumin: 4.2 g/dL (ref 3.5–5.2)
Alkaline Phosphatase: 82 U/L (ref 39–117)
BUN: 10 mg/dL (ref 6–23)
CO2: 25 mEq/L (ref 19–32)
Calcium: 9.1 mg/dL (ref 8.4–10.5)
Creatinine, Ser: 0.97 mg/dL (ref 0.50–1.35)
GFR calc Af Amer: >90 mL/min (ref >90)
GFR calc non Af Amer: >90 mL/min (ref >90)
Glucose, Bld: 118 mg/dL — ABNORMAL HIGH (ref 70–99)
Potassium: 3.7 mEq/L (ref 3.5–5.1)
Total Protein: 7.4 g/dL (ref 6.0–8.3)

## 2013-01-22 LAB — RETICULOCYTES
RBC.: 3.19 MIL/uL — ABNORMAL LOW (ref 4.22–5.81)
Retic Count, Absolute: 787.9 10*3/uL — ABNORMAL HIGH (ref 19.0–186.0)
Retic Ct Pct: 24.7 % — ABNORMAL HIGH (ref 0.4–3.1)

## 2013-01-22 MED ORDER — SODIUM CHLORIDE 0.9 % IV BOLUS (SEPSIS)
1000.0000 mL | Freq: Once | INTRAVENOUS | Status: AC
  Administered 2013-01-22: 1000 mL via INTRAVENOUS

## 2013-01-22 MED ORDER — DOCUSATE SODIUM 100 MG PO CAPS
100.0000 mg | ORAL_CAPSULE | Freq: Two times a day (BID) | ORAL | Status: DC
  Administered 2013-01-22 – 2013-01-23 (×2): 100 mg via ORAL
  Filled 2013-01-22 (×6): qty 1

## 2013-01-22 MED ORDER — DEXMEDETOMIDINE HCL IN NACL 200 MCG/50ML IV SOLN
0.2000 ug/kg/h | INTRAVENOUS | Status: DC
  Administered 2013-01-22 (×2): 0.5 ug/kg/h via INTRAVENOUS
  Administered 2013-01-22: 0.6 ug/kg/h via INTRAVENOUS
  Administered 2013-01-23: 0.4 ug/kg/h via INTRAVENOUS
  Filled 2013-01-22 (×4): qty 50

## 2013-01-22 MED ORDER — FOLIC ACID 1 MG PO TABS
1.0000 mg | ORAL_TABLET | Freq: Every day | ORAL | Status: DC
  Administered 2013-01-22 – 2013-01-26 (×5): 1 mg via ORAL
  Filled 2013-01-22 (×5): qty 1

## 2013-01-22 MED ORDER — HYDROMORPHONE HCL PF 1 MG/ML IJ SOLN
2.0000 mg | INTRAMUSCULAR | Status: AC
  Administered 2013-01-22 (×3): 2 mg via INTRAVENOUS
  Filled 2013-01-22 (×4): qty 2

## 2013-01-22 MED ORDER — OXYCODONE HCL 5 MG PO TABS
30.0000 mg | ORAL_TABLET | Freq: Four times a day (QID) | ORAL | Status: DC | PRN
  Administered 2013-01-22 – 2013-01-24 (×2): 30 mg via ORAL
  Filled 2013-01-22 (×2): qty 6

## 2013-01-22 MED ORDER — DIPHENHYDRAMINE HCL 50 MG/ML IJ SOLN
25.0000 mg | Freq: Once | INTRAMUSCULAR | Status: AC
  Administered 2013-01-22: 25 mg via INTRAVENOUS
  Filled 2013-01-22: qty 1

## 2013-01-22 MED ORDER — HYDROMORPHONE HCL PF 1 MG/ML IJ SOLN
1.0000 mg | INTRAMUSCULAR | Status: DC | PRN
  Administered 2013-01-22: 2 mg via INTRAVENOUS
  Filled 2013-01-22: qty 2

## 2013-01-22 MED ORDER — ONDANSETRON HCL 4 MG/2ML IJ SOLN
4.0000 mg | Freq: Four times a day (QID) | INTRAMUSCULAR | Status: DC | PRN
  Administered 2013-01-23: 4 mg via INTRAVENOUS
  Filled 2013-01-22 (×2): qty 2

## 2013-01-22 MED ORDER — HALOPERIDOL LACTATE 5 MG/ML IJ SOLN
INTRAMUSCULAR | Status: AC
  Filled 2013-01-22: qty 1

## 2013-01-22 MED ORDER — SODIUM CHLORIDE 0.9 % IV SOLN
INTRAVENOUS | Status: DC
  Administered 2013-01-22 – 2013-01-25 (×5): via INTRAVENOUS
  Administered 2013-01-25: 125 mL via INTRAVENOUS
  Administered 2013-01-26: 1000 mL via INTRAVENOUS
  Administered 2013-01-26: 06:00:00 via INTRAVENOUS

## 2013-01-22 MED ORDER — OXYCODONE HCL ER 40 MG PO T12A
80.0000 mg | EXTENDED_RELEASE_TABLET | Freq: Two times a day (BID) | ORAL | Status: DC
  Administered 2013-01-22 – 2013-01-24 (×4): 80 mg via ORAL
  Filled 2013-01-22 (×3): qty 4
  Filled 2013-01-22: qty 2
  Filled 2013-01-22: qty 8
  Filled 2013-01-22: qty 4

## 2013-01-22 MED ORDER — HYDROXYUREA 500 MG PO CAPS
1000.0000 mg | ORAL_CAPSULE | Freq: Every day | ORAL | Status: DC
  Administered 2013-01-22 – 2013-01-26 (×5): 1000 mg via ORAL
  Filled 2013-01-22 (×7): qty 2

## 2013-01-22 MED ORDER — HEPARIN SODIUM (PORCINE) 5000 UNIT/ML IJ SOLN
5000.0000 [IU] | Freq: Three times a day (TID) | INTRAMUSCULAR | Status: DC
  Administered 2013-01-22 – 2013-01-26 (×12): 5000 [IU] via SUBCUTANEOUS
  Filled 2013-01-22 (×15): qty 1

## 2013-01-22 MED ORDER — HYDROMORPHONE HCL PF 1 MG/ML IJ SOLN
1.0000 mg | INTRAMUSCULAR | Status: AC | PRN
  Administered 2013-01-22 – 2013-01-23 (×3): 1 mg via INTRAVENOUS
  Filled 2013-01-22 (×3): qty 1

## 2013-01-22 MED ORDER — ALPRAZOLAM 1 MG PO TABS
1.0000 mg | ORAL_TABLET | Freq: Three times a day (TID) | ORAL | Status: DC | PRN
  Administered 2013-01-22 – 2013-01-26 (×5): 1 mg via ORAL
  Filled 2013-01-22 (×5): qty 1

## 2013-01-22 MED ORDER — DIPHENHYDRAMINE HCL 50 MG/ML IJ SOLN
25.0000 mg | INTRAMUSCULAR | Status: DC | PRN
  Administered 2013-01-22 – 2013-01-25 (×7): 25 mg via INTRAVENOUS
  Filled 2013-01-22 (×8): qty 1

## 2013-01-22 MED ORDER — ONDANSETRON HCL 4 MG/2ML IJ SOLN
4.0000 mg | Freq: Once | INTRAMUSCULAR | Status: AC
  Administered 2013-01-22: 4 mg via INTRAVENOUS
  Filled 2013-01-22: qty 2

## 2013-01-22 MED ORDER — DIPHENHYDRAMINE HCL 50 MG/ML IJ SOLN: 25.0000 mg | Freq: Four times a day (QID) | INTRAMUSCULAR | Status: DC | PRN

## 2013-01-22 MED ORDER — ONDANSETRON HCL 4 MG PO TABS: 4.0000 mg | ORAL_TABLET | Freq: Four times a day (QID) | ORAL | Status: DC | PRN

## 2013-01-22 NOTE — Progress Notes (Signed)
RRT called to 1519 for assistance with pt. Pt found withering in bed, rubbing back across bed back and forth, and constantly pulling oxygen off. Bedside RN has not been able to leave pt for over an hour due to safety concerns. Call placed to Dr Elisabeth Pigeon with Dr Izola Price answering. Requested a transfer to a Stepdown bed and CCM involvement with Precedex. Orders received from Dr Izola Price. Pt transferred to 1236.

## 2013-01-22 NOTE — Care Management Note (Signed)
   CARE MANAGEMENT NOTE 01/22/2013  Patient:  [name], [name]   Account Number:  [id number]  Date Initiated:  01/22/2013  Documentation initiated by:  [name]  Subjective/Objective Assessment:   32 yo male admitted with sickle cell crisis. PCP: [name] Cashing, MD     Action/Plan:   Home when stable   Anticipated DC Date:     Anticipated DC Plan:  HOME/SELF CARE  In-house referral  NA      DC Planning Services  CM consult      Choice offered to / List presented to:  NA   DME arranged  NA      DME agency  NA     HH arranged  NA      HH agency  NA   Status of service:  In process, will continue to follow Medicare Important Message given?   (If response is "NO", the following Medicare IM given date fields will be blank) Date Medicare IM given:   Date Additional Medicare IM given:    Discharge Disposition:    Per UR Regulation:  Reviewed for med. necessity/level of care/duration of stay  If discussed at Long Length of Stay Meetings, dates discussed:    Comments:  01/22/13 1342 [name] [phone] Chart reviewed for utilization of services. No needs idnetified at this time.

## 2013-01-22 NOTE — Progress Notes (Signed)
[facility] PROGRESS NOTE  FIORE DETJEN BMW:[id number] DOB: 02/10/1981 DOA: 01/22/2013 PCP: Billee Cashing, MD  Brief narrative: Addendum to admission note done today 01/22/2013 32 year old male with past medical history of sickle cell disease who presented to [facility] ED 01/22/2013 with worsening generalized pain typical of his sickle cell crisis pain.  Assessment/Plan:  Principal Problem:   Sickle cell anemia with crisis - Continue current pain regimen with Dilaudid 1 mg every 2 hours IV as needed; continue OxyContin 80 mg every 12 hours scheduled as patient home medication regimen - May use oxycodone 30 mg by mouth every 6 hours as needed for moderate pain. - Benadryl IV 25 mg every 6 hours as needed for itching - Continue hydroxyurea and folic acid - K pad when necessary Active problems:   Dehydration - Continue IV fluid  Code Status: full code Family Communication: no family at the bedside Disposition Plan: home when stable  Manson Passey, MD  [facility] Pager [phone]  If 7PM-7AM, please contact night-coverage [email or web address] Password [email or web address] 01/22/2013, 11:53 AM   LOS: 0 days   Consultants:  None   Procedures:  None   Antibiotics:  None   HPI/Subjective: No overnight events.   Objective: Filed Vitals:   01/22/13 0330 01/22/13 0834 01/22/13 1017  BP: 152/98 145/85 135/68  Pulse: 86 116 118  Temp: 98.8 F (37.1 C) 98.8 F (37.1 C) 99.3 F (37.4 C)  TempSrc: Oral Oral Oral  Resp: 20 20 18   Height: 5\' 5"  (1.651 m) 5\' 6"  (1.676 m)   Weight: 70.308 kg (155 lb) 74.5 kg (164 lb 3.9 oz)   SpO2: 95% 90% 90%   No intake or output data in the 24 hours ending 01/22/13 1153  Exam:   General:  Pt is alert, follows commands appropriately, not in acute distress  Cardiovascular: Regular rate and rhythm, S1/S2, no murmurs, no rubs, no gallops  Respiratory: Clear to auscultation bilaterally, no wheezing, no crackles, no rhonchi  Abdomen: Soft,  non tender, non distended, bowel sounds present, no guarding  Extremities: No edema, pulses DP and PT palpable bilaterally  Neuro: Grossly nonfocal  Data Reviewed: Basic Metabolic Panel:  Recent Labs Lab 01/22/13 0522  NA 138  K 3.7  CL 104  CO2 25  GLUCOSE 118*  BUN 10  CREATININE 0.97  CALCIUM 9.1   Liver Function Tests:  Recent Labs Lab 01/22/13 0522  AST 39*  ALT 21  ALKPHOS 82  BILITOT 3.8*  PROT 7.4  ALBUMIN 4.2   No results found for this basename: LIPASE, AMYLASE,  in the last 168 hours No results found for this basename: AMMONIA,  in the last 168 hours CBC:  Recent Labs Lab 01/22/13 0522  WBC 12.0*  NEUTROABS 5.7  HGB 9.9*  HCT 27.2*  MCV 85.3  PLT 219   Cardiac Enzymes: No results found for this basename: CKTOTAL, CKMB, CKMBINDEX, TROPONINI,  in the last 168 hours BNP: No components found with this basename: POCBNP,  CBG: No results found for this basename: GLUCAP,  in the last 168 hours  No results found for this or any previous visit (from the past 240 hour(s)).   Studies: No results found.  Scheduled Meds: . docusate sodium  100 mg Oral BID  . folic acid  1 mg Oral Daily  . heparin  5,000 Units Subcutaneous Q8H  . hydroxyurea  1,000 mg Oral Q breakfast  . OxyCODONE  80 mg Oral Q12H   Continuous Infusions: . sodium chloride 125  mL/hr at 01/22/13 [phone]

## 2013-01-22 NOTE — H&P (Signed)
[facility] History and Physical  [name] AVW:[id number] DOB: 1980-04-27    PCP:   Billee Cashing, MD   Chief Complaint: Sickle cell crisis   HPI: [name] is an 32 y.o. male with hx of sickle cell disease, but infrequent sickle cell crisis, last admitted over a year ago, hx chest syndrome, s/p CCY, presents today to the ER with usual back and leg pain typical of sickle cell crisis.  There has been no chest pain or shortness of breath, fever, or chills.  Evaluation in the ER included a reticulocyte count of 24 percent,  Hb of 9.9 grams per dL , with normal renal function test. His total bili was 3.8, In the ER,  IVF and IV pain medication was given, but patient continue to have pain, and hospitalist was asked to admit for sickle cell crisis.  Rewiew of Systems:  Constitutional: Negative for malaise, fever and chills. No significant weight loss or weight gain Eyes: Negative for eye pain, redness and discharge, diplopia, visual changes, or flashes of light. ENMT: Negative for ear pain, hoarseness, nasal congestion, sinus pressure and sore throat. No headaches; tinnitus, drooling, or problem swallowing. Cardiovascular: Negative for chest pain, palpitations, diaphoresis, dyspnea and peripheral edema. ; No orthopnea, PND Respiratory: Negative for cough, hemoptysis, wheezing and stridor. No pleuritic chestpain. Gastrointestinal: Negative for nausea, vomiting, diarrhea, constipation, abdominal pain, melena, blood in stool, hematemesis, jaundice and rectal bleeding.    Genitourinary: Negative for frequency, dysuria, incontinence,flank pain and hematuria; Musculoskeletal: Negative for swelling and trauma.;  Skin: . Negative for pruritus, rash, abrasions, bruising and skin lesion.; ulcerations Neuro: Negative for headache, lightheadedness and neck stiffness. Negative for weakness, altered level of consciousness , altered mental status, extremity weakness, burning feet, involuntary  movement, seizure and syncope.  Psych: negative for anxiety, depression, insomnia, tearfulness, panic attacks, hallucinations, paranoia, suicidal or homicidal ideation     Past Medical History  Diagnosis Date  . Sickle cell anemia     Past Surgical History  Procedure Laterality Date  . Cholecystectomy      Medications:  HOME MEDS: Prior to Admission medications   Medication Sig Start Date End Date Taking? Authorizing Provider  ALPRAZolam Prudy Feeler) 1 MG tablet Take 1 mg by mouth 3 (three) times daily as needed for anxiety.  10/31/11  Yes Josph Macho, MD  Cyanocobalamin (VITAMIN B 12 PO) Take 1 tablet by mouth.   Yes Historical Provider, MD  folic acid (FOLVITE) 1 MG tablet Take 1 mg by mouth daily.     Yes Historical Provider, MD  hydroxyurea (HYDREA) 500 MG capsule Take 2 capsules (1,000 mg total) by mouth daily. May take with food to minimize GI side effects. 11/29/11  Yes Josph Macho, MD  Multiple Vitamin (MULITIVITAMIN WITH MINERALS) TABS Take 1 tablet by mouth daily.   Yes Historical Provider, MD  oxyCODONE (OXYCONTIN) 80 MG 12 hr tablet Take 1 tablet (80 mg total) by mouth every 12 (twelve) hours. 12/10/12  Yes Josph Macho, MD  oxycodone (ROXICODONE) 30 MG immediate release tablet Take 1 tablet (30 mg total) by mouth every 6 (six) hours as needed. 01/08/13  Yes Josph Macho, MD  promethazine (PHENERGAN) 25 MG tablet Take 1 tablet (25 mg total) by mouth every 6 (six) hours as needed for nausea. 12/25/12  Yes Josph Macho, MD  Sodium Fluoride (PREVIDENT 5000 BOOSTER PLUS) 1.1 % PSTE Take by mouth as needed. 11/03/12   Historical Provider, MD  Allergies:  Allergies  Allergen Reactions  . Morphine And Related Rash  . Dilaudid [Hydromorphone Hcl]     dilusional only with 4mg  dose. Can't take "too much"  . Promethazine Hcl Hives    Social History:   reports that he has never smoked. He has never used smokeless tobacco. He reports that he drinks alcohol. He reports  that he does not use illicit drugs.  Family History: Family History  Problem Relation Age of Onset  . Sickle cell trait       Physical Exam: Filed Vitals:   01/22/13 0330  BP: 152/98  Pulse: 86  Temp: 98.8 F (37.1 C)  TempSrc: Oral  Resp: 20  Height: 5\' 5"  (1.651 m)  Weight: 70.308 kg (155 lb)  SpO2: 95%   Blood pressure 152/98, pulse 86, temperature 98.8 F (37.1 C), temperature source Oral, resp. rate 20, height 5\' 5"  (1.651 m), weight 70.308 kg (155 lb), SpO2 95.00%.  GEN:  Pleasant patient lying in the stretcher in no acute distress; cooperative with exam. PSYCH:  alert and oriented x4; does not appear anxious or depressed; affect is appropriate. HEENT: Mucous membranes pink and anicteric; PERRLA; EOM intact; no cervical lymphadenopathy nor thyromegaly or carotid bruit; no JVD; There were no stridor. Neck is very supple. Breasts:: Not examined CHEST WALL: No tenderness CHEST: Normal respiration, clear to auscultation bilaterally.  HEART: Regular rate and rhythm.  There are no murmur, rub, or gallops.   BACK: No kyphosis or scoliosis; no CVA tenderness ABDOMEN: soft and non-tender; no masses, no organomegaly, normal abdominal bowel sounds; no pannus; no intertriginous candida. There is no rebound and no distention. Rectal Exam: Not done EXTREMITIES: No bone or joint deformity; age-appropriate arthropathy of the hands and knees; no edema; no ulcerations.  There is no calf tenderness. Genitalia: not examined PULSES: 2+ and symmetric SKIN: Normal hydration no rash or ulceration CNS: Cranial nerves 2-12 grossly intact no focal lateralizing neurologic deficit.  Speech is fluent; uvula elevated with phonation, facial symmetry and tongue midline. DTR are normal bilaterally, cerebella exam is intact, barbinski is negative and strengths are equaled bilaterally.  No sensory loss.   Labs on Admission:  Basic Metabolic Panel:  Recent Labs Lab 01/22/13 0522  NA 138  K 3.7  CL  104  CO2 25  GLUCOSE 118*  BUN 10  CREATININE 0.97  CALCIUM 9.1   Liver Function Tests:  Recent Labs Lab 01/22/13 0522  AST 39*  ALT 21  ALKPHOS 82  BILITOT 3.8*  PROT 7.4  ALBUMIN 4.2   No results found for this basename: LIPASE, AMYLASE,  in the last 168 hours CBC:  Recent Labs Lab 01/22/13 0522  WBC 12.0*  NEUTROABS 5.7  HGB 9.9*  HCT 27.2*  MCV 85.3  PLT 219   CBG: No results found for this basename: GLUCAP,  in the last 168 hours    Radiological Exams on Admission: No results found.  Assessment/Plan Present on Admission:  . Sickle cell anemia with crisis . Dehydration  PLAN:  This patient presents with sickle cell crisis and will be admitted for further treatment.  Will treat with IVF, IV pain medications as tolerated, and supplement Oxygen.  Patient is stable, full code, and will be admitted to general medical floor. Home meds will be continued except some vitamin supplements.  Will [facility] patient to [facility] service.  He has done well with his hx of sickle cell in the past.  Thank you for allowing me to partake  in the care of your nice patient.    Other plans as per orders.  Code Status: FULL Unk Lightning, MD. [facility] Pager [phone] 7pm to 7am.  01/22/2013, 6:49 AM

## 2013-01-22 NOTE — Progress Notes (Signed)
Pt arrived to room 1319 via stretcher from ED at 0700.  Pt is alert and oriented and ambulating in room.  Oriented pt to room and unit.  Pt c/o generalized pain. Call bell is within reach.  Will continue to monitor.

## 2013-01-22 NOTE — Progress Notes (Signed)
eLink Physician-Brief Progress Note Patient Name: [name] DOB: June 07, 1980 MRN: [id number]  Date of Service  01/22/2013   HPI/Events of Note   Acute delirium Haldol may be contraindicated due to allergy   eICU Interventions   Started Precedex gtt   Intervention Category Major Interventions: Delirium, psychosis, severe agitation - evaluation and management  [name] 01/22/2013, 4:42 PM

## 2013-01-22 NOTE — ED Notes (Signed)
Pt c/o sickle cell pain that began a few hours ago; pt states that he is having back, bilateral shoulder, chest and arm pain; pt states that this is typical pain for him.

## 2013-01-22 NOTE — Progress Notes (Signed)
01/22/13 2015- Pt has not voided since 1200. Bladder scan done showed . MD notified and received order for foley insertion.

## 2013-01-22 NOTE — ED Notes (Signed)
Called to give report to floor. Nurse not available to take report and will call back.

## 2013-01-22 NOTE — Progress Notes (Signed)
Went into pt room around 1420, pt was asleep, pulse ox was placed on pt's fingers oxygen stats were 66 percent on room air.  Placed the pt on 5L nasal canula oxygen stats increased to the 90s.  Pt was able to awaken but was very irritable and not following commands.  He was itching and moving all over the bed.  The pt kept trying to remove the oxygen.  Staff had to stay in the room to ensure he kept the oxygen on.  Dr. Elisabeth Pigeon was paged and made aware of the pt's condition.  A rapid response was called since we could not calm the pt, manage his pain and itching, and he kept removing his oxygen.  The pt has transferred to stepdown unit.  His girlfriend accompanied him.

## 2013-01-22 NOTE — ED Provider Notes (Signed)
CSN: [id number]     Arrival date & time 01/22/13  0325 History   First MD Initiated Contact with Patient 01/22/13 [phone]     Chief Complaint  Patient presents with  . Sickle Cell Pain Crisis   (Consider location/radiation/quality/duration/timing/severity/associated sxs/prior Treatment) Patient is a 32 y.o. male presenting with sickle cell pain. The history is provided by the patient.  Sickle Cell Pain Crisis  patient here complaining of whole-body pain similar to his sickle cell crisis. Denies any fever or chills. No cough or congestion. No headache or blurred vision. No neck pain or photophobia. Denies abdominal pain. No dark or bloody urine. Pain is characterized as dull and constant and made better with nothing. Used his home opiates without relief. States he feels this way he used to get admitted to the hospital.  Past Medical History  Diagnosis Date  . Sickle cell anemia    Past Surgical History  Procedure Laterality Date  . Cholecystectomy     Family History  Problem Relation Age of Onset  . Sickle cell trait     History  Substance Use Topics  . Smoking status: Never Smoker   . Smokeless tobacco: Never Used  . Alcohol Use: Yes     Comment: occassionally    Review of Systems  All other systems reviewed and are negative.    Allergies  Morphine and related; Dilaudid; and Promethazine hcl  Home Medications   Current Outpatient Rx  Name  Route  Sig  Dispense  Refill  . ALPRAZolam (XANAX) 1 MG tablet      1 mg 3 (three) times daily as needed.          . Cyanocobalamin (VITAMIN B 12 PO)   Oral   Take 1 tablet by mouth.         . folic acid (FOLVITE) 1 MG tablet   Oral   Take 1 mg by mouth daily.           . hydroxyurea (HYDREA) 500 MG capsule   Oral   Take 2 capsules (1,000 mg total) by mouth daily. May take with food to minimize GI side effects.   60 capsule   2   . Multiple Vitamin (MULITIVITAMIN WITH MINERALS) TABS   Oral   Take 1 tablet by mouth  daily.         Marland Kitchen oxyCODONE (OXYCONTIN) 80 MG 12 hr tablet   Oral   Take 1 tablet (80 mg total) by mouth every 12 (twelve) hours.   60 tablet   0   . OxyCODONE (OXYCONTIN) 80 mg T12A 12 hr tablet   Oral   Take 1 tablet (80 mg total) by mouth every 12 (twelve) hours.   60 tablet   0   . oxycodone (ROXICODONE) 30 MG immediate release tablet   Oral   Take 1 tablet (30 mg total) by mouth every 6 (six) hours as needed.   120 tablet   0   . PREVIDENT 5000 BOOSTER PLUS 1.1 % PSTE   Oral   Take by mouth as needed.          . promethazine (PHENERGAN) 25 MG tablet   Oral   Take 1 tablet (25 mg total) by mouth every 6 (six) hours as needed for nausea.   90 tablet   3    BP 152/98  Pulse 86  Temp(Src) 98.8 F (37.1 C) (Oral)  Resp 20  Ht 5\' 5"  (1.651 m)  Wt 155 lb (70.308  kg)  BMI 25.79 kg/m2  SpO2 95% Physical Exam  Nursing note and vitals reviewed. Constitutional: He is oriented to person, place, and time. He appears well-developed and well-nourished.  Non-toxic appearance. No distress.  HENT:  Head: Normocephalic and atraumatic.  Eyes: Conjunctivae, EOM and lids are normal. Pupils are equal, round, and reactive to light.  Neck: Normal range of motion. Neck supple. No tracheal deviation present. No mass present.  Cardiovascular: Normal rate, regular rhythm and normal heart sounds.  Exam reveals no gallop.   No murmur heard. Pulmonary/Chest: Effort normal and breath sounds normal. No stridor. No respiratory distress. He has no decreased breath sounds. He has no wheezes. He has no rhonchi. He has no rales.  Abdominal: Soft. Normal appearance and bowel sounds are normal. He exhibits no distension. There is no tenderness. There is no rebound and no CVA tenderness.  Musculoskeletal: Normal range of motion. He exhibits no edema and no tenderness.  Neurological: He is alert and oriented to person, place, and time. He has normal strength. No cranial nerve deficit or sensory  deficit. GCS eye subscore is 4. GCS verbal subscore is 5. GCS motor subscore is 6.  Skin: Skin is warm and dry. No abrasion and no rash noted.  Psychiatric: He has a normal mood and affect. His speech is normal and behavior is normal.    ED Course  Procedures (including critical care time) Labs Review Labs Reviewed  CBC WITH DIFFERENTIAL  COMPREHENSIVE METABOLIC PANEL  RETICULOCYTES   Imaging Review No results found.  EKG Interpretation   None       MDM  No diagnosis found. Patient given IV fluids and opiates medications x3 with IV-hydromorphone. Pain still remains and patient will be admitted for further management by hospitalist service    Toy Baker, MD 01/22/13 [phone]

## 2013-01-23 ENCOUNTER — Encounter (HOSPITAL_COMMUNITY): Payer: Self-pay | Admitting: Radiology

## 2013-01-23 ENCOUNTER — Inpatient Hospital Stay (HOSPITAL_COMMUNITY): Payer: Medicare Other

## 2013-01-23 DIAGNOSIS — J9601 Acute respiratory failure with hypoxia: Secondary | ICD-10-CM | POA: Diagnosis present

## 2013-01-23 DIAGNOSIS — D72829 Elevated white blood cell count, unspecified: Secondary | ICD-10-CM

## 2013-01-23 LAB — COMPREHENSIVE METABOLIC PANEL
AST: 91 U/L — ABNORMAL HIGH (ref 0–37)
Albumin: 3.7 g/dL (ref 3.5–5.2)
Alkaline Phosphatase: 73 U/L (ref 39–117)
CO2: 24 mEq/L (ref 19–32)
Calcium: 8.4 mg/dL (ref 8.4–10.5)
Chloride: 111 mEq/L (ref 96–112)
Creatinine, Ser: 1.29 mg/dL (ref 0.50–1.35)
GFR calc Af Amer: 83 mL/min — ABNORMAL LOW (ref >90)
GFR calc non Af Amer: 72 mL/min — ABNORMAL LOW (ref >90)
Glucose, Bld: 122 mg/dL — ABNORMAL HIGH (ref 70–99)
Total Bilirubin: 3.9 mg/dL — ABNORMAL HIGH (ref 0.3–1.2)

## 2013-01-23 LAB — CBC
HCT: 22 % — ABNORMAL LOW (ref 39.0–52.0)
Hemoglobin: 8.5 g/dL — ABNORMAL LOW (ref 13.0–17.0)
MCH: 31.1 pg (ref 26.0–34.0)
MCV: 80.6 fL (ref 78.0–100.0)
RBC: 2.73 MIL/uL — ABNORMAL LOW (ref 4.22–5.81)
RDW: 22.1 % — ABNORMAL HIGH (ref 11.5–15.5)
WBC: 13.8 10*3/uL — ABNORMAL HIGH (ref 4.0–10.5)

## 2013-01-23 LAB — FERRITIN: Ferritin: 442 ng/mL — ABNORMAL HIGH (ref 22–322)

## 2013-01-23 LAB — MRSA PCR SCREENING: MRSA by PCR: NEGATIVE

## 2013-01-23 MED ORDER — HYDROMORPHONE HCL PF 1 MG/ML IJ SOLN: 1.0000 mg | INTRAMUSCULAR | Status: DC | PRN

## 2013-01-23 MED ORDER — DEXMEDETOMIDINE HCL IN NACL 200 MCG/50ML IV SOLN
0.2000 ug/kg/h | INTRAVENOUS | Status: DC
  Administered 2013-01-23: 1.2 ug/kg/h via INTRAVENOUS
  Administered 2013-01-23: 1 ug/kg/h via INTRAVENOUS
  Administered 2013-01-23: 0.5 ug/kg/h via INTRAVENOUS
  Administered 2013-01-23 – 2013-01-24 (×6): 0.8 ug/kg/h via INTRAVENOUS
  Filled 2013-01-23: qty 100
  Filled 2013-01-23 (×2): qty 50
  Filled 2013-01-23: qty 100
  Filled 2013-01-23: qty 50
  Filled 2013-01-23: qty 100

## 2013-01-23 MED ORDER — IOHEXOL 350 MG/ML SOLN
100.0000 mL | Freq: Once | INTRAVENOUS | Status: AC | PRN
  Administered 2013-01-23: 100 mL via INTRAVENOUS

## 2013-01-23 MED ORDER — HYDROMORPHONE HCL PF 1 MG/ML IJ SOLN
1.0000 mg | INTRAMUSCULAR | Status: DC | PRN
  Administered 2013-01-23: 2 mg via INTRAVENOUS
  Administered 2013-01-23: 1 mg via INTRAVENOUS
  Administered 2013-01-23: 2 mg via INTRAVENOUS
  Administered 2013-01-24 (×2): 1 mg via INTRAVENOUS
  Administered 2013-01-24 (×2): 2 mg via INTRAVENOUS
  Administered 2013-01-24 – 2013-01-25 (×7): 1 mg via INTRAVENOUS
  Administered 2013-01-25 (×2): 2 mg via INTRAVENOUS
  Filled 2013-01-23: qty 1
  Filled 2013-01-23: qty 2
  Filled 2013-01-23 (×3): qty 1
  Filled 2013-01-23: qty 2
  Filled 2013-01-23 (×2): qty 1
  Filled 2013-01-23 (×2): qty 2
  Filled 2013-01-23 (×3): qty 1
  Filled 2013-01-23: qty 2
  Filled 2013-01-23 (×2): qty 1
  Filled 2013-01-23: qty 2
  Filled 2013-01-23: qty 1

## 2013-01-23 MED ORDER — HYDROMORPHONE HCL PF 1 MG/ML IJ SOLN: 1.0000 mg | Freq: Two times a day (BID) | INTRAMUSCULAR | Status: DC | PRN

## 2013-01-23 MED ORDER — HYDROMORPHONE HCL PF 1 MG/ML IJ SOLN
1.0000 mg | INTRAMUSCULAR | Status: AC | PRN
  Administered 2013-01-23 (×3): 1 mg via INTRAVENOUS
  Filled 2013-01-23 (×3): qty 1

## 2013-01-23 NOTE — Progress Notes (Addendum)
[facility] PROGRESS NOTE  [name] ZOX:[id number] DOB: 03-06-1981 DOA: 01/22/2013 PCP: Billee Cashing, MD  Brief narrative: 32 year old male with past medical history of sickle cell disease who presented to [facility] ED 01/22/2013 with worsening generalized pain typical of his sickle cell crisis pain. Patient became confused yesterday, was found to be in hypoxic respiratory failure requiring transfer to the step down unit. Due to ongoing acute delirium, agitation and confusion patient was started on Precedex. Precedex has been discontinued this am and patient now oriented, alert.  Assessment/Plan:   Principal Problem:  Acute hypoxic respiratory failure - Perhaps secondary to narcotics. Obtain CT chest angio to rule out PE. Obtain chest x-ray. - Continue to monitor in step down unit. - oxygen support via nasal canula to keep O2 saturation above 90%  Active problems:  Drug induced encephalopathy - Secondary to narcotics - Patient now oriented. The Precedex started 10/16  is now discontinued Sickle cell anemia with crisis  - Continue current pain regimen with Dilaudid 1 mg every 2 hours IV as needed; continue OxyContin 80 mg every 12 hours scheduled as patient home medication regimen  - May use oxycodone 30 mg by mouth every 6 hours as needed for moderate pain.  - Adjuvant pain therapy: none  - Wean IV narcotics when pain rated less than 7/10. Today the patient's pain is rated at level 8/10.  - When able to tolerate oral therapy, convert at 50-75% of IV dose & give PRNs Q 2-3 hours.  - Monitor CBC. Last hemoglobin 10/16 was 9.9.  - Reticulocyte count 24.7 on 10/16 - Monitor bilirubin/LDH Q 72 hours to monitor hemolysis. Check ferritin adn LDH today   - Benadryl IV 25 mg every 6 hours as needed for itching  - Continue hydroxyurea and folic acid  - K pad when necessary    Code Status: full code  Family Communication: no family at the bedside  Disposition Plan:now is SDU,  continue to monitor in SDU for another 24 hours  Manson Passey, MD  [facility]  Pager [phone]   Consultants:  PCCM  Procedures:  None  Antibiotics:  None    If 7PM-7AM, please contact night-coverage [email or web address] Password [email or web address] 01/23/2013, 10:28 AM   LOS: 1 day    HPI/Subjective: Now stable. Yesterday confused, acute delirium as described above.  Objective: Filed Vitals:   01/23/13 0600 01/23/13 0800 01/23/13 0950 01/23/13 1000  BP: 100/48 105/51  102/48  Pulse: 78 81 96 94  Temp:  97.9 F (36.6 C)    TempSrc:  Oral    Resp: 14 16 26 17   Height:      Weight:      SpO2: 95% 96% 86% 86%    Intake/Output Summary (Last 24 hours) at 01/23/13 1028 Last data filed at 01/23/13 1000  Gross per 24 hour  Intake 3242.82 ml  Output   1720 ml  Net 1522.82 ml    Exam:   General:  Pt is alert, not in acute distress  Cardiovascular: Regular rate and rhythm, S1/S2 appreciated  Respiratory: Clear to auscultation bilaterally, no wheezing, no crackles, no rhonchi  Abdomen: Soft, non tender, non distended, bowel sounds present, no guarding  Extremities: No edema, pulses DP and PT palpable bilaterally  Neuro: Grossly nonfocal  Data Reviewed: Basic Metabolic Panel:  Recent Labs Lab 01/22/13 0522  NA 138  K 3.7  CL 104  CO2 25  GLUCOSE 118*  BUN 10  CREATININE 0.97  CALCIUM 9.1   Liver  Function Tests:  Recent Labs Lab 01/22/13 0522  AST 39*  ALT 21  ALKPHOS 82  BILITOT 3.8*  PROT 7.4  ALBUMIN 4.2   No results found for this basename: LIPASE, AMYLASE,  in the last 168 hours No results found for this basename: AMMONIA,  in the last 168 hours CBC:  Recent Labs Lab 01/22/13 0522  WBC 12.0*  NEUTROABS 5.7  HGB 9.9*  HCT 27.2*  MCV 85.3  PLT 219   Cardiac Enzymes: No results found for this basename: CKTOTAL, CKMB, CKMBINDEX, TROPONINI,  in the last 168 hours BNP: No components found with this basename: POCBNP,  CBG: No results found  for this basename: GLUCAP,  in the last 168 hours  No results found for this or any previous visit (from the past 240 hour(s)).   Studies: No results found.  Scheduled Meds: . docusate sodium  100 mg Oral BID  . folic acid  1 mg Oral Daily  . heparin  5,000 Units Subcutaneous Q8H  . hydroxyurea  1,000 mg Oral Q breakfast  . OxyCODONE  80 mg Oral Q12H   Continuous Infusions: . sodium chloride 125 mL/hr at 01/23/13 0824  . dexmedetomidine Stopped (01/23/13 0720)

## 2013-01-23 NOTE — Progress Notes (Signed)
Patient woke up acutely complaining of bilateral arm cramping and pain, 10/10.  Also complaining of congestion with minor bloody nose noted.  Water bottle added to nasal cannula.  Patient c/o dry eyes, assisted patient in removing his contacts and irrigated eyes with sterile saline for comfort.  Dilaudid 1mg  IV given for patient c/o pain.  Will continue to monitor.

## 2013-01-23 NOTE — Clinical Documentation Improvement (Signed)
THIS DOCUMENT IS NOT A PERMANENT PART OF THE MEDICAL RECORD  Please update your documentation with the medical record to reflect your response to this query. If you need help knowing how to do this please call [phone].  01/23/13  Dear Dr.Shakiyah Cirilo,   In an effort to better capture your patient's severity of illness, reflect appropriate length of stay and utilization of resources, a review of the patient medical record has revealed the following indicators.    Based on your clinical judgment, please clarify and document in a progress note and/or discharge summary the clinical condition associated with the following supporting information:  In responding to this query please exercise your independent judgment.  The fact that a query is asked, does not imply that any particular answer is desired or expected.   Pt w/  Acute Delirium 2/2 narcotics  Clarification Needed   Please clarify if the Acute Delirium 2/2 narcotics can be further specified as one of the diagnoses listed below and document in pn or d/c summary.     Possible Clinical Conditions?  _______Encephalopathy (describe type if known)                       Anoxic                       Septic                       Drug induced                         Hypertensive                       Metabolic                       _______ Toxic  _______Other Condition__________________ _______Cannot Clinically Determine   Supporting Information:  Risk Factors:  Sickle Cell Anemia  Resp Failure  Delirium 2/2 narcotics  Psychosis  Signs & Symptoms: Pn 01/23/13 Acute delirium  - Secondary to narcotics  Yesterday confused, acute delirium as described above.  Diagnostics:  Treatment: Xanax 1mg  Reviewed: change made in progress note 01/23/13 Thank You,  Enis Slipper  RN, BSN, MSN/Inf, CCDS Clinical Documentation Specialist [facility] Dept Pager: [phone] / E-mail:  [email or web address]  [phone] Health Information Management [place]

## 2013-01-24 ENCOUNTER — Inpatient Hospital Stay (HOSPITAL_COMMUNITY): Payer: Medicare Other

## 2013-01-24 DIAGNOSIS — G929 Unspecified toxic encephalopathy: Secondary | ICD-10-CM | POA: Diagnosis present

## 2013-01-24 DIAGNOSIS — G92 Toxic encephalopathy: Secondary | ICD-10-CM | POA: Diagnosis present

## 2013-01-24 DIAGNOSIS — K5900 Constipation, unspecified: Secondary | ICD-10-CM

## 2013-01-24 DIAGNOSIS — R509 Fever, unspecified: Secondary | ICD-10-CM | POA: Diagnosis present

## 2013-01-24 LAB — URINALYSIS, ROUTINE W REFLEX MICROSCOPIC
Bilirubin Urine: NEGATIVE
Glucose, UA: NEGATIVE mg/dL
Ketones, ur: NEGATIVE mg/dL
Protein, ur: NEGATIVE mg/dL
pH: 5.5 (ref 5.0–8.0)

## 2013-01-24 LAB — URINE MICROSCOPIC-ADD ON

## 2013-01-24 MED ORDER — VITAMINS A & D EX OINT
TOPICAL_OINTMENT | CUTANEOUS | Status: AC
  Administered 2013-01-25: 5
  Filled 2013-01-24: qty 10

## 2013-01-24 MED ORDER — PIPERACILLIN-TAZOBACTAM 3.375 G IVPB
3.3750 g | Freq: Three times a day (TID) | INTRAVENOUS | Status: DC
  Administered 2013-01-24 – 2013-01-26 (×7): 3.375 g via INTRAVENOUS
  Filled 2013-01-24 (×9): qty 50

## 2013-01-24 MED ORDER — POLYETHYLENE GLYCOL 3350 17 G PO PACK
17.0000 g | PACK | Freq: Two times a day (BID) | ORAL | Status: DC
  Administered 2013-01-24 – 2013-01-26 (×6): 17 g via ORAL
  Filled 2013-01-24 (×6): qty 1

## 2013-01-24 MED ORDER — DOCUSATE SODIUM 100 MG PO CAPS
100.0000 mg | ORAL_CAPSULE | Freq: Two times a day (BID) | ORAL | Status: DC
  Administered 2013-01-24 – 2013-01-26 (×6): 100 mg via ORAL
  Filled 2013-01-24 (×6): qty 1

## 2013-01-24 MED ORDER — SIMETHICONE 80 MG PO CHEW
80.0000 mg | CHEWABLE_TABLET | Freq: Two times a day (BID) | ORAL | Status: DC
  Administered 2013-01-24 – 2013-01-26 (×5): 80 mg via ORAL
  Filled 2013-01-24 (×6): qty 1

## 2013-01-24 MED ORDER — VANCOMYCIN HCL IN DEXTROSE 1-5 GM/200ML-% IV SOLN
1000.0000 mg | Freq: Two times a day (BID) | INTRAVENOUS | Status: DC
  Administered 2013-01-24 – 2013-01-26 (×5): 1000 mg via INTRAVENOUS
  Filled 2013-01-24 (×6): qty 200

## 2013-01-24 MED ORDER — SODIUM CHLORIDE 0.9 % IJ SOLN: 10.0000 mL | INTRAMUSCULAR | Status: DC | PRN

## 2013-01-24 MED ORDER — VITAMINS A & D EX OINT
TOPICAL_OINTMENT | CUTANEOUS | Status: AC
  Administered 2013-01-24: 5
  Filled 2013-01-24: qty 5

## 2013-01-24 MED ORDER — DEXMEDETOMIDINE HCL IN NACL 200 MCG/50ML IV SOLN
0.2000 ug/kg/h | INTRAVENOUS | Status: DC
  Administered 2013-01-24: 0.301 ug/kg/h via INTRAVENOUS
  Administered 2013-01-25 (×2): 0.698 ug/kg/h via INTRAVENOUS
  Administered 2013-01-25 (×2): 0.376 ug/kg/h via INTRAVENOUS
  Administered 2013-01-25: 0.322 ug/kg/h via INTRAVENOUS
  Filled 2013-01-24 (×4): qty 50

## 2013-01-24 MED ORDER — SODIUM CHLORIDE 0.9 % IJ SOLN
10.0000 mL | Freq: Two times a day (BID) | INTRAMUSCULAR | Status: DC
  Administered 2013-01-24 – 2013-01-26 (×5): 10 mL

## 2013-01-24 NOTE — Progress Notes (Signed)
ANTIBIOTIC CONSULT NOTE - INITIAL  Pharmacy Consult for Vancomycin, Zosyn Indication: Empiric coverage, PNA or UTI  Allergies  Allergen Reactions  . Morphine And Related Rash  . Dilaudid [Hydromorphone Hcl]     dilusional only with 4mg  dose. Can't take "too much"  . Promethazine Hcl Hives    Patient Measurements: Height: 5\' 6"  (167.6 cm) Weight: 164 lb 3.9 oz (74.5 kg) IBW/kg (Calculated) : 63.8  Vital Signs: Temp: 99.4 F (37.4 C) (10/18 0800) Temp src: Axillary (10/18 0800) BP: 111/74 mmHg (10/18 0700) Pulse Rate: 69 (10/18 0700) Intake/Output from previous day: 10/17 0701 - 10/18 0700 In: 3256 [I.V.:3256] Out: 3525 [Urine:3525]  Labs:  Recent Labs  01/22/13 0522 01/23/13 1240  WBC 12.0* 13.8*  HGB 9.9* 8.5*  PLT 219 220  CREATININE 0.97 1.29   Estimated Creatinine Clearance: 73.5 ml/min (by C-G formula based on Cr of 1.29).  Microbiology: Recent Results (from the past 720 hour(s))  MRSA PCR SCREENING     Status: None   Collection Time    01/23/13  7:24 AM      Result Value Range Status   MRSA by PCR NEGATIVE  NEGATIVE Final   Comment:            The GeneXpert MRSA Assay (FDA     approved for NASAL specimens     only), is one component of a     comprehensive MRSA colonization     surveillance program. It is not     intended to diagnose MRSA     infection nor to guide or     monitor treatment for     MRSA infections.    Medical History: Past Medical History  Diagnosis Date  . Sickle cell anemia     Medications:  Anti-infectives   None     Assessment: 32 yo M admitted 10/16 with worsening of sickle cell pain, in sickle cell crisis.  He was transferred to SD on 10/16 for hypoxic respiratory failure, and on 10/18 pharmacy is asked to dose vancomycin and zosyn for possible infection and fevers.   Tm 101.4, currently 99.4  WBC 13.8  SCr 1.29, with CrCl ~ 73  CXR without signs of pneumonia; UA and urine culture today and blood  cultures  Goal of Therapy:  Vancomycin trough level 15-20 mcg/ml Appropriate abx dosing, eradication of infection.   Plan:   Zosyn 3.375g IV Q8H infused over 4hrs.  Vancomycin 1g IV q12h.  Measure Vanc trough at steady state.  Follow up renal fxn and culture results.   Lynann Beaver PharmD, BCPS Pager [phone] 01/24/2013 11:27 AM

## 2013-01-24 NOTE — Progress Notes (Addendum)
[facility] PROGRESS NOTE  [name] ZOX:[id number] DOB: 1981/01/30 DOA: 01/22/2013 PCP: Billee Cashing, MD  Brief narrative: 32 year old male with past medical history of sickle cell disease who presented to [facility] ED 01/22/2013 with worsening generalized pain typical of his sickle cell crisis pain. Patient became confused, was found to be in hypoxic respiratory failure requiring transfer to the step down unit 01/22/2013. Due to ongoing acute delirium, agitation and confusion patient required brief course of Precedex. Precedex restarted 10/17 at about 3 pm for agitation.   Assessment/Plan:   Principal Problem:  Acute hypoxic respiratory failure  - Perhaps secondary to narcotics.  CT chest angio negative for PE. CXR shows pulmonary vascular congestion - Continue to monitor in step down unit.  - oxygen support via nasal canula to keep O2 saturation above 90%   Active problems:  Drug induced encephalopathy  - Secondary to narcotics  - Patient again on precedex; appreciate PCCM consult Fever - unclear etiology - CXR with no signs of pneumonia; obtain UA and urine culture today and blood cultures - since pt spiked fever again today i think it is reasonable to start vancomycin and zosyn and taper as results of cultures become available  Abdominal distention identified on imaging study - not distended on physical exam - abdominal x ray done today showed gas in bowel but no obstruction; pt passing flatus and had 1 BM yesterday - bowel regimen with colace and miralax BID - add simethicone BID Sickle cell anemia with crisis  - Continue current pain regimen with Dilaudid 1-2 mg every 2 hours IV as needed; continue OxyContin 80 mg every 12 hours scheduled as patient home medication regimen  - May use oxycodone 30 mg by mouth every 6 hours as needed for moderate pain.  - Adjuvant pain therapy: none  - Wean IV narcotics when pain rated less than 7/10. Pain today 8/10.  - When able to  tolerate oral therapy, convert at 50-75% of IV dose & give PRNs Q 2-3 hours.  - Monitor CBC. Last hemoglobin 10/17 was 8.5 - Reticulocyte count 24.7 on 10/16  - Monitor bilirubin/LDH Q 72 hours to monitor hemolysis. Ferritin 10/17 was /442 and LDH 568 on 10/17 - Benadryl IV 25 mg every 6 hours as needed for itching  - Continue hydroxyurea and folic acid  - K pad when necessary   Code Status: full code  Family Communication: no family at the bedside  Disposition Plan: continue to monitor on SDU  Manson Passey, MD  [facility]  Pager [phone]   Consultants:  PCCM for precedex only  Procedures:  None  Antibiotics:  Vancomycin 01/24/2013 --> Zosyn 01/24/2013 -->    If 7PM-7AM, please contact night-coverage [email or web address] Password [email or web address] 01/24/2013, 6:55 AM   LOS: 2 days    HPI/Subjective: No overnight events.  Objective: Filed Vitals:   01/24/13 0300 01/24/13 0400 01/24/13 0500 01/24/13 0600  BP: 100/58 111/65 104/64 113/72  Pulse: 75 72 71 69  Temp:  97.8 F (36.6 C)    TempSrc:  Axillary    Resp: 12 12 11 11   Height:      Weight:      SpO2: 98% 100% 100% 100%    Intake/Output Summary (Last 24 hours) at 01/24/13 0655 Last data filed at 01/24/13 0600  Gross per 24 hour  Intake 3241.1 ml  Output   3400 ml  Net -158.9 ml    Exam:   General:  Pt is sleeping, not in acute distress  Cardiovascular: Regular rate and rhythm, S1/S2 appreciated    Respiratory: Clear to auscultation bilaterally, no wheezing, no crackles, no rhonchi  Abdomen: Soft, non tender, non distended, bowel sounds present, no guarding  Extremities: No edema, pulses DP and PT palpable bilaterally  Neuro: Grossly nonfocal  Data Reviewed: Basic Metabolic Panel:  Recent Labs Lab 01/22/13 0522 01/23/13 1240  NA 138 141  K 3.7 4.3  CL 104 111  CO2 25 24  GLUCOSE 118* 122*  BUN 10 20  CREATININE 0.97 1.29  CALCIUM 9.1 8.4   Liver Function Tests:  Recent Labs Lab  01/22/13 0522 01/23/13 1240  AST 39* 91*  ALT 21 33  ALKPHOS 82 73  BILITOT 3.8* 3.9*  PROT 7.4 6.5  ALBUMIN 4.2 3.7   No results found for this basename: LIPASE, AMYLASE,  in the last 168 hours No results found for this basename: AMMONIA,  in the last 168 hours CBC:  Recent Labs Lab 01/22/13 0522 01/23/13 1240  WBC 12.0* 13.8*  NEUTROABS 5.7  --   HGB 9.9* 8.5*  HCT 27.2* 22.0*  MCV 85.3 80.6  PLT 219 220   Cardiac Enzymes: No results found for this basename: CKTOTAL, CKMB, CKMBINDEX, TROPONINI,  in the last 168 hours BNP: No components found with this basename: POCBNP,  CBG: No results found for this basename: GLUCAP,  in the last 168 hours  Recent Results (from the past 240 hour(s))  MRSA PCR SCREENING     Status: None   Collection Time    01/23/13  7:24 AM      Result Value Range Status   MRSA by PCR NEGATIVE  NEGATIVE Final     Studies: Ct Angio Chest Pe W/cm &/or Wo Cm 01/23/2013    IMPRESSION: 1. No large or central pulmonary emboli. Examination limitations as above.  2.  Cardiomegaly.  3.  Evidence of mild pulmonary edema.  4.  Stomach significantly distended with air and fluid.   Electronically Signed   By: Jerene Dilling M.D.   On: 01/23/2013 12:29   Dg Chest Port 1 View 01/23/2013     IMPRESSION: Cardiomegaly and pulmonary vascular congestion with mild interstitial edema.   Electronically Signed   By: Annia Belt M.D.   On: 01/23/2013 11:26    Scheduled Meds: . docusate sodium  100 mg Oral BID  . folic acid  1 mg Oral Daily  . heparin  5,000 Units Subcutaneous Q8H  . hydroxyurea  1,000 mg Oral Q breakfast  . OxyCODONE  80 mg Oral Q12H   Continuous Infusions: . sodium chloride 125 mL/hr at 01/24/13 0030  . dexmedetomidine 0.8 mcg/kg/hr (01/24/13 0600)

## 2013-01-24 NOTE — Progress Notes (Signed)
Peripherally Inserted Central Catheter/Midline Placement  The IV Nurse has discussed with the patient and/or persons authorized to consent for the patient, the purpose of this procedure and the potential benefits and risks involved with this procedure.  The benefits include less needle sticks, lab draws from the catheter and patient may be discharged home with the catheter.  Risks include, but not limited to, infection, bleeding, blood clot (thrombus formation), and puncture of an artery; nerve damage and irregular heat beat.  Alternatives to this procedure were also discussed.  PICC/Midline Placement Documentation  PICC / Midline Double Lumen 01/24/13 PICC Left Basilic 44 cm 2 cm (Active)       [name] 01/24/2013, 4:38 PM

## 2013-01-25 DIAGNOSIS — J9600 Acute respiratory failure, unspecified whether with hypoxia or hypercapnia: Secondary | ICD-10-CM

## 2013-01-25 DIAGNOSIS — D5700 Hb-SS disease with crisis, unspecified: Principal | ICD-10-CM

## 2013-01-25 DIAGNOSIS — R509 Fever, unspecified: Secondary | ICD-10-CM

## 2013-01-25 DIAGNOSIS — R404 Transient alteration of awareness: Secondary | ICD-10-CM

## 2013-01-25 LAB — URINE CULTURE

## 2013-01-25 MED ORDER — OXYCODONE HCL ER 20 MG PO T12A
60.0000 mg | EXTENDED_RELEASE_TABLET | Freq: Two times a day (BID) | ORAL | Status: DC
  Administered 2013-01-25 (×2): 60 mg via ORAL
  Filled 2013-01-25 (×2): qty 3

## 2013-01-25 MED ORDER — HYDROXYZINE HCL 50 MG PO TABS
50.0000 mg | ORAL_TABLET | Freq: Four times a day (QID) | ORAL | Status: DC | PRN
  Administered 2013-01-25 – 2013-01-26 (×3): 50 mg via ORAL
  Filled 2013-01-25 (×4): qty 1

## 2013-01-25 MED ORDER — OXYCODONE HCL 5 MG PO TABS
15.0000 mg | ORAL_TABLET | Freq: Four times a day (QID) | ORAL | Status: DC | PRN
  Administered 2013-01-25 (×2): 15 mg via ORAL
  Filled 2013-01-25 (×2): qty 3

## 2013-01-25 MED ORDER — HYDROMORPHONE HCL PF 1 MG/ML IJ SOLN
1.0000 mg | Freq: Once | INTRAMUSCULAR | Status: AC
  Administered 2013-01-25: 1 mg via INTRAVENOUS

## 2013-01-25 NOTE — Progress Notes (Addendum)
[facility] PROGRESS NOTE  EVENS MENO UXL:[id number] DOB: 02-22-1981 DOA: 01/22/2013 PCP: Billee Cashing, MD  Brief narrative: 32 year old male with past medical history of sickle cell disease who presented to [facility] ED 01/22/2013 with worsening generalized pain typical of his sickle cell crisis pain. Patient became confused, was found to be in hypoxic respiratory failure requiring transfer to the step down unit 01/22/2013. Due to ongoing acute delirium, agitation and confusion patient required brief course of Precedex. Precedex restarted so PCCM asked for input on management.   Assessment/Plan:   Principal Problem:  Acute hypoxic respiratory failure  - Perhaps secondary to narcotics. CT chest angio negative for PE. CXR shows pulmonary vascular congestion  - Continue to monitor in step down unit.  - oxygen support via nasal canula to keep O2 saturation above 90%  - respiratory status stable Active problems:  Drug induced encephalopathy  - Secondary to narcotics  - Patient again on precedex; appreciate PCCM consult  Fever  - unclear etiology  - CXR with no signs of pneumonia; follow up results of blood cultures; UA showed small leukocytes, follow up results of urine culture - no fever overnight since abx started  Abdominal distention identified on imaging study  - not distended on physical exam  - abdominal x ray done today showed gas in bowel but no obstruction; pt passing flatus and had BM - bowel regimen with colace and miralax BID  - continue simethicone BID  Sickle cell anemia with crisis  - Continue current pain regimen with Dilaudid 1-2 mg every 2 hours IV as needed; continue OxyContin but decrease to 60 mg every 12 hours scheduled as patient home medication regimen  - May use oxycodone but decrease to 15 mg by mouth every 6 hours as needed for moderate pain.  - Adjuvant pain therapy: none  - Wean IV narcotics when pain rated less than 7/10.   - When able to tolerate  oral therapy, convert at 50-75% of IV dose & give PRNs Q 2-3 hours.  - Monitor CBC. Last hemoglobin 10/17 was 8.5  - Reticulocyte count 24.7 on 10/16  - Monitor bilirubin/LDH Q 72 hours to monitor hemolysis. Ferritin 10/17 was 442 and LDH 568 on 10/17  - Benadryl IV 25 mg every 6 hours as needed for itching  - Continue hydroxyurea and folic acid  - K pad when necessary   Code Status: full code  Family Communication: no family at the bedside  Disposition Plan: continue to monitor on SDU   Manson Passey, MD  [facility]  Pager [phone]   Consultants:  PCCM  Procedures:  None  Antibiotics:  Vancomycin 01/24/2013 -->  Zosyn 01/24/2013 -->    If 7PM-7AM, please contact night-coverage [email or web address] Password [email or web address] 01/25/2013, 11:11 AM   LOS: 3 days    HPI/Subjective: No events overnight.  Objective: Filed Vitals:   01/25/13 0300 01/25/13 0400 01/25/13 0500 01/25/13 0800  BP: 127/39 109/64 137/64   Pulse: 109 109 108   Temp:  98.7 F (37.1 C)  98.9 F (37.2 C)  TempSrc:  Oral  Oral  Resp: 25 19 24    Height:      Weight:      SpO2: 95% 92% 92%     Intake/Output Summary (Last 24 hours) at 01/25/13 1111 Last data filed at 01/25/13 0509  Gross per 24 hour  Intake 1631.63 ml  Output   1725 ml  Net -93.37 ml    Exam:   General:  Pt is disoriented,  not in acute distress  Cardiovascular: Regular rate and rhythm, S1/S2a ppeciated  Respiratory: Clear to auscultation bilaterally, no wheezing, no crackles, no rhonchi  Abdomen: Soft, non tender, non distended, bowel sounds present, no guarding  Extremities: No edema, pulses DP and PT palpable bilaterally  Neuro: Grossly nonfocal  Data Reviewed: Basic Metabolic Panel:  Recent Labs Lab 01/22/13 0522 01/23/13 1240  NA 138 141  K 3.7 4.3  CL 104 111  CO2 25 24  GLUCOSE 118* 122*  BUN 10 20  CREATININE 0.97 1.29  CALCIUM 9.1 8.4   Liver Function Tests:  Recent Labs Lab 01/22/13 0522  01/23/13 1240  AST 39* 91*  ALT 21 33  ALKPHOS 82 73  BILITOT 3.8* 3.9*  PROT 7.4 6.5  ALBUMIN 4.2 3.7   No results found for this basename: LIPASE, AMYLASE,  in the last 168 hours No results found for this basename: AMMONIA,  in the last 168 hours CBC:  Recent Labs Lab 01/22/13 0522 01/23/13 1240  WBC 12.0* 13.8*  NEUTROABS 5.7  --   HGB 9.9* 8.5*  HCT 27.2* 22.0*  MCV 85.3 80.6  PLT 219 220   Cardiac Enzymes: No results found for this basename: CKTOTAL, CKMB, CKMBINDEX, TROPONINI,  in the last 168 hours BNP: No components found with this basename: POCBNP,  CBG: No results found for this basename: GLUCAP,  in the last 168 hours  Recent Results (from the past 240 hour(s))  MRSA PCR SCREENING     Status: None   Collection Time    01/23/13  7:24 AM      Result Value Range Status   MRSA by PCR NEGATIVE  NEGATIVE Final   Comment:            The GeneXpert MRSA Assay (FDA     approved for NASAL specimens     only), is one component of a     comprehensive MRSA colonization     surveillance program. It is not     intended to diagnose MRSA     infection nor to guide or     monitor treatment for     MRSA infections.     Studies: Ct Angio Chest Pe W/cm &/or Wo Cm  01/23/2013   CLINICAL DATA:  History of sickle cell disease. Question pulmonary embolism.  EXAM: CT ANGIOGRAPHY CHEST WITH CONTRAST  TECHNIQUE: Multidetector CT imaging of the chest was performed using the standard protocol during bolus administration of intravenous contrast. Multiplanar CT image reconstructions including MIPs were obtained to evaluate the vascular anatomy.  CONTRAST:  OMNIPAQUE IOHEXOL 350 MG/ML SOLN  COMPARISON:  02/04/2012 and multiple priors  FINDINGS: Assessment for pulmonary emboli is limited by patient motion and contrast bolus timing. No large or central pulmonary emboli are identified. No pericardial effusion. The heart is enlarged. Thoracic aorta is normal in caliber. Visualized  thyroid is unremarkable.  No pathologically enlarged hilar, mediastinal, or axillary lymph nodes are seen. Airways are patent. Ground-glass opacities noted in the lower lobes, likely reflecting mild edema and atelectasis. No pleural effusion or pneumothorax is identified.  The stomach is partially visualized and significantly distended with fluid and air. Visualized upper abdomen is otherwise unremarkable. No evidence of acute osseous abnormality. There are sclerotic changes in endplate and can cavities, typical of sickle cell anemia.  Review of the MIP images confirms the above findings.  IMPRESSION: 1. No large or central pulmonary emboli. Examination limitations as above.  2.  Cardiomegaly.  3.  Evidence of mild pulmonary edema.  4.  Stomach significantly distended with air and fluid.   Electronically Signed   By: Jerene Dilling M.D.   On: 01/23/2013 12:29   Dg Chest Port 1 View  01/23/2013   CLINICAL DATA:  Hypoxia. Sickle cell.  EXAM: PORTABLE CHEST - 1 VIEW  COMPARISON:  Chest radiograph 02/05/2012.  FINDINGS: Stable cardiomegaly. Pulmonary vascular congestion as well as mild interstitial pulmonary edema. No large consolidative pulmonary opacities. No pleural effusion or pneumothorax. Osseous sequelae of sickle cell.  IMPRESSION: Cardiomegaly and pulmonary vascular congestion with mild interstitial edema.   Electronically Signed   By: Annia Belt M.D.   On: 01/23/2013 11:26   Dg Abd Portable 1v  01/24/2013   CLINICAL DATA:  Followup abdominal distention.  EXAM: PORTABLE ABDOMEN - 1 VIEW  COMPARISON:  None.  FINDINGS: Increased air is noted in the bowel, but there is no significant bowel dilation to suggest obstruction.  Clips are upper quadrant reflect a prior cholecystectomy. The soft tissues are otherwise unremarkable.  There are bone changes of longstanding sickle cell disease.  IMPRESSION: Increased bowel gas, but no evidence of obstruction.   Electronically Signed   By: Amie Portland M.D.    On: 01/24/2013 07:55    Scheduled Meds: . docusate sodium  100 mg Oral BID  . folic acid  1 mg Oral Daily  . heparin  5,000 Units Subcutaneous Q8H  . hydroxyurea  1,000 mg Oral Q breakfast  . OxyCODONE  80 mg Oral Q12H  . piperacillin-tazobactam (ZOSYN)  IV  3.375 g Intravenous Q8H  . polyethylene glycol  17 g Oral BID  . simethicone  80 mg Oral BID  . vancomycin  1,000 mg Intravenous Q12H   Continuous Infusions: . sodium chloride 125 mL/hr at 01/24/13 0030

## 2013-01-25 NOTE — Progress Notes (Signed)
Pt very agitated, continues to remove O2 which is reading in the low 80's. Has made  abrasions to his face and body due to continued itching mitten placed. Benadryl iv given with no relief. Pt becoming more agitated  Precedex drip at it's max. Paged MD Katrinka Blazing   Made him aware of the Pt's increase agitation.  Will continue to monitor, family member at he bedside for support.

## 2013-01-25 NOTE — Consult Note (Signed)
PULMONARY  / CRITICAL CARE MEDICINE  Name: [name] MRN: [id number] DOB: [birth year]    ADMISSION DATE:  01/22/2013 CONSULTATION DATE:  01/25/13  REFERRING MD :  Dr Elisabeth Pigeon, [facility]:  [facility]  CHIEF COMPLAINT:  Agitation  BRIEF PATIENT DESCRIPTION: 32 yo man with SSA, admitted with painful crisis 10/16. Has has periods of inconsolable pain, associated delirium since admit.  Of note his medical hx indicates that he does have delusions in setting higher dose narcotics. Precedex was initiated 10/16 briefly, then again 10/17. Currently at Precedex 0.5. PCCM consulted re: management of the Precedex gtt. Has had some hypoxemia due to narcotics and dependent atelectasis - CT-PA was negative for PE on 10/17.  Course this admission also c/b fever, unclear etiology. Started empirically on vanco and pip/tazo on 10/18, cultures obtained.   SIGNIFICANT EVENTS / STUDIES:  CT-PA 10/17 >> no PE, cardiomegaly, ? Mild pulm edema, distended stomach w fluid and air  LINES / TUBES: L UE PICC 10/18 >>   CULTURES: Blood 10/18 >>  Urine 10/18 >>   ANTIBIOTICS: Vanco 10/18 (fever, empiric) >>  Zosyn 10/18 (fever, empiric) >>   PAST MEDICAL HISTORY :  Past Medical History  Diagnosis Date  . Sickle cell anemia    Past Surgical History  Procedure Laterality Date  . Cholecystectomy     Prior to Admission medications   Medication Sig Start Date End Date Taking? Authorizing Provider  ALPRAZolam Prudy Feeler) 1 MG tablet Take 1 mg by mouth 3 (three) times daily as needed for anxiety.  10/31/11  Yes Josph Macho, MD  Cyanocobalamin (VITAMIN B 12 PO) Take 1 tablet by mouth.   Yes Historical Provider, MD  folic acid (FOLVITE) 1 MG tablet Take 1 mg by mouth daily.     Yes Historical Provider, MD  hydroxyurea (HYDREA) 500 MG capsule Take 2 capsules (1,000 mg total) by mouth daily. May take with food to minimize GI side effects. 11/29/11  Yes Josph Macho, MD  Multiple Vitamin (MULITIVITAMIN  WITH MINERALS) TABS Take 1 tablet by mouth daily.   Yes Historical Provider, MD  oxyCODONE (OXYCONTIN) 80 MG 12 hr tablet Take 1 tablet (80 mg total) by mouth every 12 (twelve) hours. 12/10/12  Yes Josph Macho, MD  oxycodone (ROXICODONE) 30 MG immediate release tablet Take 1 tablet (30 mg total) by mouth every 6 (six) hours as needed. 01/08/13  Yes Josph Macho, MD  promethazine (PHENERGAN) 25 MG tablet Take 1 tablet (25 mg total) by mouth every 6 (six) hours as needed for nausea. 12/25/12  Yes Josph Macho, MD  Sodium Fluoride (PREVIDENT 5000 BOOSTER PLUS) 1.1 % PSTE Take by mouth as needed. 11/03/12   Historical Provider, MD   Allergies  Allergen Reactions  . Morphine And Related Rash  . Dilaudid [Hydromorphone Hcl]     dilusional only with 4mg  dose. Can't take "too much"  . Promethazine Hcl Hives    FAMILY HISTORY:  Family History  Problem Relation Age of Onset  . Sickle cell trait     SOCIAL HISTORY:  reports that he has never smoked. He has never used smokeless tobacco. He reports that he drinks alcohol. He reports that he does not use illicit drugs.  REVIEW OF SYSTEMS:   Constitutional: Negative for fever, chills, weight loss, malaise/fatigue and diaphoresis.  HENT: Negative for hearing loss, ear pain, nosebleeds, congestion, sore throat, neck pain, tinnitus and ear discharge.   Eyes: Negative for blurred vision, double  vision, photophobia, pain, discharge and redness.  Respiratory: Negative for cough, hemoptysis, sputum production, shortness of breath, wheezing and stridor.   Cardiovascular: Negative for chest pain, palpitations, orthopnea, claudication, leg swelling and PND.  Gastrointestinal: Negative for heartburn, nausea, vomiting, abdominal pain, diarrhea, constipation, blood in stool and melena.  Genitourinary: Negative for dysuria, urgency, frequency, hematuria and flank pain.  Musculoskeletal: Negative for myalgias, back pain, joint pain and falls.  Skin: Negative  for itching and rash.  Neurological: Negative for dizziness, tingling, tremors, sensory change, speech change, focal weakness, seizures, loss of consciousness, weakness and headaches.  Endo/Heme/Allergies: Negative for environmental allergies and polydipsia. Does not bruise/bleed easily.  SUBJECTIVE:  Sleepy, denies current pain (just received dilaudid)  VITAL SIGNS: Temp:  [96.4 F (35.8 C)-99 F (37.2 C)] 98.9 F (37.2 C) (10/19 0800) Pulse Rate:  [64-109] 108 (10/19 0500) Resp:  [11-25] 24 (10/19 0500) BP: (102-137)/(39-64) 137/64 mmHg (10/19 0500) SpO2:  [92 %-100 %] 92 % (10/19 0500)  PHYSICAL EXAMINATION: General:  Lethargic young man, lying in bed Neuro:  Wakes to stim but quite sleepy, moves ext's HEENT:  OP clear, pupils small and sluggish Neck:  No stridor Cardiovascular:  Regular, no M Lungs:  Decreased both bases, L > R, few basilar crackles Abdomen:  Soft, mildly distended, + BS Musculoskeletal:  No edema or deformities Skin:  No rash   Recent Labs Lab 01/22/13 0522 01/23/13 1240  NA 138 141  K 3.7 4.3  CL 104 111  CO2 25 24  BUN 10 20  CREATININE 0.97 1.29  GLUCOSE 118* 122*    Recent Labs Lab 01/22/13 0522 01/23/13 1240  HGB 9.9* 8.5*  HCT 27.2* 22.0*  WBC 12.0* 13.8*  PLT 219 220   Ct Angio Chest Pe W/cm &/or Wo Cm  01/23/2013   CLINICAL DATA:  History of sickle cell disease. Question pulmonary embolism.  EXAM: CT ANGIOGRAPHY CHEST WITH CONTRAST  TECHNIQUE: Multidetector CT imaging of the chest was performed using the standard protocol during bolus administration of intravenous contrast. Multiplanar CT image reconstructions including MIPs were obtained to evaluate the vascular anatomy.  CONTRAST:  OMNIPAQUE IOHEXOL 350 MG/ML SOLN  COMPARISON:  02/04/2012 and multiple priors  FINDINGS: Assessment for pulmonary emboli is limited by patient motion and contrast bolus timing. No large or central pulmonary emboli are identified. No pericardial  effusion. The heart is enlarged. Thoracic aorta is normal in caliber. Visualized thyroid is unremarkable.  No pathologically enlarged hilar, mediastinal, or axillary lymph nodes are seen. Airways are patent. Ground-glass opacities noted in the lower lobes, likely reflecting mild edema and atelectasis. No pleural effusion or pneumothorax is identified.  The stomach is partially visualized and significantly distended with fluid and air. Visualized upper abdomen is otherwise unremarkable. No evidence of acute osseous abnormality. There are sclerotic changes in endplate and can cavities, typical of sickle cell anemia.  Review of the MIP images confirms the above findings.  IMPRESSION: 1. No large or central pulmonary emboli. Examination limitations as above.  2.  Cardiomegaly.  3.  Evidence of mild pulmonary edema.  4.  Stomach significantly distended with air and fluid.   Electronically Signed   By: Jerene Dilling M.D.   On: 01/23/2013 12:29   Dg Chest Port 1 View  01/23/2013   CLINICAL DATA:  Hypoxia. Sickle cell.  EXAM: PORTABLE CHEST - 1 VIEW  COMPARISON:  Chest radiograph 02/05/2012.  FINDINGS: Stable cardiomegaly. Pulmonary vascular congestion as well as mild interstitial pulmonary edema. No large  consolidative pulmonary opacities. No pleural effusion or pneumothorax. Osseous sequelae of sickle cell.  IMPRESSION: Cardiomegaly and pulmonary vascular congestion with mild interstitial edema.   Electronically Signed   By: Annia Belt M.D.   On: 01/23/2013 11:26   Dg Abd Portable 1v  01/24/2013   CLINICAL DATA:  Followup abdominal distention.  EXAM: PORTABLE ABDOMEN - 1 VIEW  COMPARISON:  None.  FINDINGS: Increased air is noted in the bowel, but there is no significant bowel dilation to suggest obstruction.  Clips are upper quadrant reflect a prior cholecystectomy. The soft tissues are otherwise unremarkable.  There are bone changes of longstanding sickle cell disease.  IMPRESSION: Increased bowel gas, but  no evidence of obstruction.   Electronically Signed   By: Amie Portland M.D.   On: 01/24/2013 07:55    ASSESSMENT / PLAN:  1. Delirium  - appears to relate directly to his pain status, either due to inconsolable pain or more recently in the hospitalization to his narcotic dosing.  2. Sickle Cell Anemia, Painful Crisis without evidence Acute Chest Syndrome 3. Fever, unclear etiology 4. Hypoxemia, due to narcotic resp suppression and dependent atx  Recommendations:  - The patient is hypersomnolent at this time, was oriented on questioning during my exam. His delirium waxes and wanes and appears to be directly related to his narcotic dosing. I would stop the precedex and continue to manage his pain insuring lowest dose dilaudid necessary given his proven sensitivity to the med. Xanax prn OK but would also be careful with this med given the potential for delirium.  - continue SSA management as per [facility] plans - consider d/c abx when cx's return since no clear infectious etiology for fever and these may change metabolism of his narcs - push mobility, up to chair, pulm hygiene - we will check on him 10/20 to insure that he tolerated d/c precedex.   Levy Pupa, MD, PhD 01/25/2013, 9:50 AM [facility] [phone] or if no answer [phone]

## 2013-01-25 NOTE — Progress Notes (Signed)
eLink Physician-Brief Progress Note Patient Name: [name] DOB: [birth year] MRN: [id number]  Date of Service  01/25/2013   HPI/Events of Note   Patient very uncomfortable and agitated.  On precedex since 10/16.  Also c/o severe itching.  Multiple pain medications and xanax on the MAR.    eICU Interventions  Benadryl stopped. Atarax ordered. No change in pain medication or anxiolytics.      Deanna Artis 01/25/2013, 6:19 AM

## 2013-01-26 MED ORDER — FENTANYL CITRATE 0.05 MG/ML IJ SOLN
50.0000 ug | INTRAMUSCULAR | Status: DC | PRN
  Administered 2013-01-26: 50 ug via INTRAVENOUS

## 2013-01-26 MED ORDER — FENTANYL CITRATE 0.05 MG/ML IJ SOLN
50.0000 ug | INTRAMUSCULAR | Status: DC | PRN
  Administered 2013-01-26: 50 ug via INTRAVENOUS
  Filled 2013-01-26 (×2): qty 2

## 2013-01-26 MED ORDER — DEXMEDETOMIDINE HCL IN NACL 200 MCG/50ML IV SOLN
0.2000 ug/kg/h | INTRAVENOUS | Status: AC
  Administered 2013-01-26 (×3): 0.5 ug/kg/h via INTRAVENOUS
  Filled 2013-01-26 (×4): qty 50

## 2013-01-26 MED ORDER — OXYCODONE HCL 5 MG PO TABS: 10.0000 mg | ORAL_TABLET | Freq: Four times a day (QID) | ORAL | Status: DC | PRN

## 2013-01-26 MED ORDER — OXYCODONE HCL ER 20 MG PO T12A
80.0000 mg | EXTENDED_RELEASE_TABLET | Freq: Two times a day (BID) | ORAL | Status: DC
  Administered 2013-01-26 (×2): 80 mg via ORAL
  Filled 2013-01-26 (×2): qty 4

## 2013-01-26 MED ORDER — FENTANYL CITRATE 0.05 MG/ML IJ SOLN: 50.0000 ug | Freq: Once | INTRAMUSCULAR | Status: DC

## 2013-01-26 MED ORDER — OXYCODONE HCL 5 MG PO TABS
30.0000 mg | ORAL_TABLET | Freq: Four times a day (QID) | ORAL | Status: DC | PRN
  Administered 2013-01-26 (×2): 30 mg via ORAL
  Filled 2013-01-26 (×2): qty 6

## 2013-01-26 MED ORDER — OXYCODONE HCL ER 20 MG PO T12A: 40.0000 mg | EXTENDED_RELEASE_TABLET | Freq: Two times a day (BID) | ORAL | Status: DC

## 2013-01-26 NOTE — Progress Notes (Signed)
Pt now asking questions about transferring to [facility]. Advised that NP will not transfer him however rounding MD's in the am would be able to discuss that with him further.  He stated he will wait for his girlfriend to arrive then he decide.

## 2013-01-26 NOTE — Progress Notes (Addendum)
[facility] PROGRESS NOTE  RANGER PETRICH ZOX:[id number] DOB: 1980/07/03 DOA: 01/22/2013 PCP: Billee Cashing, MD  Brief narrative: 32 year old male with past medical history of sickle cell disease who presented to [facility] ED 01/22/2013 with worsening generalized pain typical of his sickle cell crisis pain. Patient became confused, was found to be in hypoxic respiratory failure requiring transfer to the step down unit 01/22/2013. Due to ongoing acute delirium, agitation and confusion patient required brief course of Precedex. Precedex has been used on and off now for past few days, mostly being restarted at night when pt exhibits agitation. He is on polypharmacy however besides dilaudid IV PRN he is on his usual home medication regimen. This home medication regimen has been decreased over past 24 hours, specifically OxyContin and oxycodone.  Assessment/Plan:   Principal Problem:  Acute hypoxic respiratory failure  - Perhaps secondary to narcotics. CT chest angio negative for PE. CXR showed pulmonary vascular congestion  - Continue to monitor in step down unit. O2 saturation again dropped to 86% overnight. - oxygen support via nasal canula to keep O2 saturation above 90%  - appreciate PCCM following Active problems:  Drug induced encephalopathy  - Secondary to narcotics  - Patient again on precedex; follow up PCCM to address the intermittent use of Precedex for agitation Fever  - unclear etiology; had low grade fever overnight, 99.8 F - CXR with no signs of pneumonia;  UA showed small leukocytes, follow up results of urine culture - on vanco and zosyn until final culture results available Abdominal distention identified on imaging study  - abdominal x ray showed gas in bowel but no obstruction; pt passing flatus and had BM - bowel regimen with colace and miralax BID  - continue simethicone BID  Sickle cell anemia with crisis  - Continue current pain regimen with Dilaudid 1-2 mg every 2  hours IV as needed; continue OxyContin but decrease further to 40 mg every 12 hours scheduled (home dose 80 mg Q 12 hrs)  - May use oxycodone but decrease further to 10 mg by mouth every 6 hours as needed for moderate pain (from original dose 30 mg) - Adjuvant pain therapy: none  - When able to tolerate oral therapy, convert at 50-75% of IV dose & give PRNs Q 2-3 hours.  - Monitor CBC. Last hemoglobin 10/17 was 8.5. Check CBC today - Reticulocyte count 24.7 on 10/16  - Monitor bilirubin/LDH Q 72 hours to monitor hemolysis. Ferritin 10/17 was 442 and LDH 568 on 10/17  - Benadryl IV 25 mg every 6 hours as needed for itching  - Continue hydroxyurea and folic acid  - K pad when necessary   Code Status: full code  Family Communication: no family at the bedside  Disposition Plan: continue to monitor on SDU   Manson Passey, MD  [facility]  Pager [phone]   Consultants:  PCCM  Procedures:  None  Antibiotics:  Vancomycin 01/24/2013 -->  Zosyn 01/24/2013 -->   If 7PM-7AM, please contact night-coverage [email or web address] Password [email or web address] 01/26/2013, 7:15 AM   LOS: 4 days    HPI/Subjective: Again agitated and started overnight on precedex.  Objective: Filed Vitals:   01/26/13 0500 01/26/13 0530 01/26/13 0600 01/26/13 0630  BP: 143/82 144/76 135/75 137/85  Pulse: 72 76 73 70  Temp:  99 F (37.2 C)    TempSrc:  Axillary    Resp: 18 19 18 19   Height:      Weight:      SpO2: 100% 100%  100% 100%    Intake/Output Summary (Last 24 hours) at 01/26/13 0715 Last data filed at 01/26/13 1610  Gross per 24 hour  Intake 5259.7 ml  Output   2450 ml  Net 2809.7 ml    Exam:   General:  Pt is alert, not in acute distress  Cardiovascular: Regular rate and rhythm, S1/S2, no murmurs, no rubs, no gallops  Respiratory: Clear to auscultation bilaterally, no wheezing, no crackles, no rhonchi  Abdomen: Soft, non tender, non distended, bowel sounds present, no guarding  Extremities: No  edema, pulses DP and PT palpable bilaterally  Neuro: Grossly nonfocal  Data Reviewed: Basic Metabolic Panel:  Recent Labs Lab 01/22/13 0522 01/23/13 1240  NA 138 141  K 3.7 4.3  CL 104 111  CO2 25 24  GLUCOSE 118* 122*  BUN 10 20  CREATININE 0.97 1.29  CALCIUM 9.1 8.4   Liver Function Tests:  Recent Labs Lab 01/22/13 0522 01/23/13 1240  AST 39* 91*  ALT 21 33  ALKPHOS 82 73  BILITOT 3.8* 3.9*  PROT 7.4 6.5  ALBUMIN 4.2 3.7   No results found for this basename: LIPASE, AMYLASE,  in the last 168 hours No results found for this basename: AMMONIA,  in the last 168 hours CBC:  Recent Labs Lab 01/22/13 0522 01/23/13 1240  WBC 12.0* 13.8*  NEUTROABS 5.7  --   HGB 9.9* 8.5*  HCT 27.2* 22.0*  MCV 85.3 80.6  PLT 219 220   Cardiac Enzymes: No results found for this basename: CKTOTAL, CKMB, CKMBINDEX, TROPONINI,  in the last 168 hours BNP: No components found with this basename: POCBNP,  CBG: No results found for this basename: GLUCAP,  in the last 168 hours  Recent Results (from the past 240 hour(s))  MRSA PCR SCREENING     Status: None   Collection Time    01/23/13  7:24 AM      Result Value Range Status   MRSA by PCR NEGATIVE  NEGATIVE Final   Comment:            The GeneXpert MRSA Assay (FDA     approved for NASAL specimens     only), is one component of a     comprehensive MRSA colonization     surveillance program. It is not     intended to diagnose MRSA     infection nor to guide or     monitor treatment for     MRSA infections.  URINE CULTURE     Status: None   Collection Time    01/24/13  8:20 AM      Result Value Range Status   Specimen Description URINE, CATHETERIZED   Final   Special Requests NONE   Final   Culture  Setup Time     Final   Value: 01/24/2013 16:41     Performed at [facility] Count     Final   Value: NO GROWTH     Performed at [facility]   Culture     Final   Value: NO GROWTH     Performed  at [facility]   Report Status 01/25/2013 FINAL   Final  CULTURE, BLOOD (ROUTINE X 2)     Status: None   Collection Time    01/24/13 11:16 AM      Result Value Range Status   Specimen Description BLOOD LEFT HAND   Final   Special Requests  Final   Value: BOTTLES DRAWN AEROBIC AND ANAEROBIC 1.5CC BOTH BOTTLES   Culture  Setup Time     Final   Value: 01/24/2013 17:41     Performed at [facility]   Culture     Final   Value:        BLOOD CULTURE RECEIVED NO GROWTH TO DATE CULTURE WILL BE HELD FOR 5 DAYS BEFORE ISSUING A FINAL NEGATIVE REPORT     Performed at [facility]   Report Status PENDING   Incomplete  CULTURE, BLOOD (ROUTINE X 2)     Status: None   Collection Time    01/24/13 11:21 AM      Result Value Range Status   Specimen Description BLOOD LEFT ARM   Final   Special Requests     Final   Value: BOTTLES DRAWN AEROBIC AND ANAEROBIC 1.5CC BOTH BOTTLES   Culture  Setup Time     Final   Value: 01/24/2013 17:41     Performed at [facility]   Culture     Final   Value:        BLOOD CULTURE RECEIVED NO GROWTH TO DATE CULTURE WILL BE HELD FOR 5 DAYS BEFORE ISSUING A FINAL NEGATIVE REPORT     Performed at [facility]   Report Status PENDING   Incomplete     Studies: Dg Abd Portable 1v  01/24/2013   CLINICAL DATA:  Followup abdominal distention.  EXAM: PORTABLE ABDOMEN - 1 VIEW  COMPARISON:  None.  FINDINGS: Increased air is noted in the bowel, but there is no significant bowel dilation to suggest obstruction.  Clips are upper quadrant reflect a prior cholecystectomy. The soft tissues are otherwise unremarkable.  There are bone changes of longstanding sickle cell disease.  IMPRESSION: Increased bowel gas, but no evidence of obstruction.   Electronically Signed   By: Amie Portland M.D.   On: 01/24/2013 07:55    Scheduled Meds: . docusate sodium  100 mg Oral BID  . folic acid  1 mg Oral Daily  . heparin  5,000 Units Subcutaneous Q8H   . hydroxyurea  1,000 mg Oral Q breakfast  . OxyCODONE  60 mg Oral Q12H  . piperacillin-tazobactam (ZOSYN)  IV  3.375 g Intravenous Q8H  . polyethylene glycol  17 g Oral BID  . simethicone  80 mg Oral BID  . sodium chloride  10-40 mL Intracatheter Q12H  . vancomycin  1,000 mg Intravenous Q12H   Continuous Infusions: . sodium chloride 125 mL/hr at 01/26/13 0614  . dexmedetomidine 0.5 mcg/kg/hr (01/26/13 0601)

## 2013-01-26 NOTE — Progress Notes (Signed)
eLink Physician-Brief Progress Note Patient Name: [name] DOB: 1981/03/24 MRN: [id number]  Date of Service  01/26/2013   HPI/Events of Note  Continued issues of pain control, polypharmacy, agitation.  Had dropped his sats and was placed on a 100% NRB but had also received, oxycodone scheduled, oxy prn, diluadid prn, xanax scheduled.  Has been restless, agitated, now c/o of headache throughout the night. Concern for giving haldol in setting of allergy to phenergan.  eICU Interventions  Plan: Feel there is not other option that to place back on precedex for the night. Issues of polypharmacy need to be addressed during AM rounds   Intervention Category Major Interventions: Delirium, psychosis, severe agitation - evaluation and management  [name] 01/26/2013, 2:29 AM

## 2013-01-26 NOTE — Progress Notes (Signed)
CARE MANAGEMENT NOTE 01/26/2013  Patient:  [name], [name]   Account Number:  [id number]  Date Initiated:  01/22/2013  Documentation initiated by:  Savreen Gebhardt,TYMEEKA  Subjective/Objective Assessment:   31 yo male admitted with sickle cell crisis. PCP: Billee Cashing, MD     Action/Plan:   Home when stable   Anticipated DC Date:  01/29/2013   Anticipated DC Plan:  HOME/SELF CARE  In-house referral  NA      DC Planning Services  CM consult      Choice offered to / List presented to:  NA   DME arranged  NA      DME agency  NA     HH arranged  NA      HH agency  NA   Status of service:  In process, will continue to follow Medicare Important Message given?   (If response is "NO", the following Medicare IM given date fields will be blank) Date Medicare IM given:   Date Additional Medicare IM given:    Discharge Disposition:    Per UR Regulation:  Reviewed for med. necessity/level of care/duration of stay  If discussed at Long Length of Stay Meetings, dates discussed:    Comments:  [id number]/Effrey Davidow Stark Jock, BSN, [place] [phone] Chart Reviewed for discharge and hospital needs. Discharge needs at time of review:  patient remains due to control of pain and sepsis. Review of patient progress due on [id number].   01/22/13 1342 Tymeeka Dakiyah Heinke,RN,MSn [phone] Chart reviewed for utilization of services. No needs idnetified at this time.

## 2013-01-26 NOTE — Progress Notes (Signed)
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 Has has periods of inconsolable pain, associated delirium since admit.  Of note his medical hx indicates that he does have delusions in setting higher dose narcotics. Precedex was initiated 10/16 briefly, then again 10/17. Currently at Precedex 0.5. PCCM consulted re: management of the Precedex gtt. Has had some hypoxemia due to narcotics and dependent atelectasis - CT-PA was negative for PE on 10/17.  Course this admission also c/b fever, unclear etiology. Started empirically on vanco and pip/tazo on 10/18, cultures obtained.   SIGNIFICANT EVENTS / STUDIES:  CT-PA 10/17 >> no PE, cardiomegaly, ? Mild pulm edema, distended stomach w fluid and air  LINES / TUBES: L UE PICC 10/18 >>   CULTURES: Blood 10/18 >>  Urine 10/18 >>   ANTIBIOTICS: Vanco 10/18 (fever, empiric) >>  Zosyn 10/18 (fever, empiric) >>    SUBJECTIVE:  Sleepy, says pain 7 out of 10, acute agitation overnight with dilaudid and needed precedex. VITAL SIGNS: Temp:  [97.6 F (36.4 C)-99.8 F (37.7 C)] 97.6 F (36.4 C) (10/20 0814) Pulse Rate:  [66-123] 66 (10/20 0800) Resp:  [14-28] 17 (10/20 0800) BP: (126-164)/(57-91) 139/91 mmHg (10/20 0800) SpO2:  [86 %-100 %] 100 % (10/20 0800) FiO2 (%):  [100 %] 100 % (10/20 0200)  PHYSICAL EXAMINATION: General:  Lethargic young man, lying in bed on precedex Neuro:  Wakes to stim but quite sleepy, moves ext's HEENT:  OP clear, pupils small and sluggish Neck:  No stridor Cardiovascular:  Regular, no M Lungs:  Decreased both bases, L > R, few basilar crackles Abdomen:  Soft, mildly distended, + BS Musculoskeletal:  No edema or deformities Skin:  No  rash   Recent Labs Lab 01/22/13 0522 01/23/13 1240  NA 138 141  K 3.7 4.3  CL 104 111  CO2 25 24  BUN 10 20  CREATININE 0.97 1.29  GLUCOSE 118* 122*    Recent Labs Lab 01/22/13 0522 01/23/13 1240  HGB 9.9* 8.5*  HCT 27.2* 22.0*  WBC 12.0* 13.8*  PLT 219 220   No results found.  ASSESSMENT / PLAN:  1. Delirium  - continues to appears to relate directly to his pain status, either due to inconsolable pain or more recently in the hospitalization to his narcotic dosing.  Also hx of delirium with dilaudid use in 2013.  He is on oxycontin 80q12h at home and uses 30 of roxi q6h prn  Now on precedex drip  2. Sickle Cell Anemia, Painful Crisis without evidence Acute Chest Syndrome 3. Fever, unclear etiology 4. Hypoxemia, due to narcotic resp suppression and dependent atx  Recommendations:  - I would TRY TO GET OFF PRECEDEX, STOP DILAUDID, increase oxycontin to 80bid and prn roxi at 30 q6h > - continue SSA management as per [facility] plans - consider d/c abx when cx's return since no clear infectious etiology for fever and these may change metabolism of his narcs - push mobility, up to chair, pulm hygiene  Caryl Bis  [phone]  Cell  [phone]  If no response or cell goes to voicemail, call beeper [phone]  01/26/2013, 10:19 AM [facility]

## 2013-01-26 NOTE — Progress Notes (Signed)
Event: Received several calls regarding pt not happy w/ transition to PO pain regimen. Pt states it's not working. Pt wants IV Dilaudid but unable to receive d/t h/o encephalopathy and hypoxia related to IV Dilaudid. He is allergic to Morphine.  I agreed to give Fentanyl 50 mcg IV now then q3h in addition to his PO meds x 3 doses of Fentanyl. 20 minutes after pt received PO meds (Oxycontin 80mg  and Oxy IR 30mg ) and IV Fentanyl he reported to RN meds not working and he now wants to leave AMA and go to [place]. NP to bedside. Subjective: Pt reports he is having severe pain and his PO pain med regimen is not working. Also reports very little relief w/ IV Fentanyl. Pt states he wants to leave AMA and go to [facility]. Objective: Mr Zeiner is a 32 yo AA male with SSA, admitted 01/22/13 with painful crisis. Has had periods of inconsolable pain, associated delirium since admit with IV Dilaudid. His medical hx indicates that he does have delusions in setting of higher dose narcotics. PCCM was consulted and Precedex was initiated 10/16 briefly, then again 10/17. Precedex was titrated off today and pt was placed on his home PO pain med regimen. At bedside pt noted standing at bedside bent over, holding open to a visitors arm and grimacing. VSS. Pt at times appears unable to speak d/t pain.  Assessment/Plan: 1. 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 Pain score currentlyDiscussed pt w/ Dr Toniann Fail given his report to want to leave AMA. Will Repaet Fentanyl 50 mcq IV and increase from q3h to q2h PRN in addition to his PO regimen in hopes of managing pain well enough to get pt through until am as he very much wants to see Dr Myna Hidalgo (requesting MD in am consult w/ Dr Drue Dun). Will continue to monitor closely in SDU.  Leanne Chang, NP-C [facility] Pager [phone]

## 2013-01-26 NOTE — Progress Notes (Signed)
Pt refusing to take PO OxyContin IR 80mg , stated that was his home medication and he gets IV pain meds in the hospital. Reviewing the chart I discussed with pt that he has reactions to Morphine (rash), Promethazine HcL = (Hives) and Dilaudid (Dilusions, confusion w/difficulty breathing). Paged [facility] NP to bedside. K Schorr, NP ([facility]) Rx Fentanyl q3hrs PRN with order end time of 7:30.   Pt wants Dr. Ross Marcus to see him the the a.m.. I advised him to discuss this with rounding Dr. Elisabeth Pigeon in the a.m. And she can order an consult if she feels it is needed.   Pt later took his PO OxyContin IR 80mg  along with 50 mcq of Fentanyl.

## 2013-01-27 DIAGNOSIS — M8700 Idiopathic aseptic necrosis of unspecified bone: Secondary | ICD-10-CM | POA: Insufficient documentation

## 2013-01-27 NOTE — Progress Notes (Signed)
Follow up:  Notified 30 minutes after bedside visit that right after pt received repeat dose of IV Fentanyl he demanded his PICC and foley be removed and he left AMA. AMA form signed. Pt left ambulatory w/ girlfriend and 2 male visitors.   Leanne Chang, NP-C [facility] Pager [phone]

## 2013-01-27 NOTE — Progress Notes (Signed)
Pt has signed himself out AMA.

## 2013-01-28 DIAGNOSIS — D5700 Hb-SS disease with crisis, unspecified: Secondary | ICD-10-CM | POA: Insufficient documentation

## 2013-01-29 NOTE — Discharge Summary (Signed)
Physician Discharge Summary  [name] WUJ:[id number] DOB: 23-Sep-1980 DOA: 01/22/2013  PCP: Billee Cashing, MD  Admit date: 01/22/2013 Discharge date: 01/29/2013  Recommendations for Outpatient Follow-up:  1. Pt left AMA; I was not physically present as the time when pt left  Discharge Diagnoses:  Principal Problem:   Acute respiratory failure with hypoxia Active Problems:   Sickle cell anemia with crisis   Acute delirium   Fever   Encephalopathy, toxic   Pruritus   Leukocytosis   Constipation   Dehydration    Discharge Condition: left AMA  Diet recommendation: as tolerated  History of present illness:  32 year old male with past medical history of sickle cell disease who presented to [facility] ED 01/22/2013 with worsening generalized pain typical of his sickle cell crisis pain. Patient became confused 01/22/2013, was found to be in hypoxic respiratory failure requiring transfer to the step down unit 01/22/2013. Due to ongoing acute delirium, agitation and confusion patient required brief course of Precedex. Precedex has been used on and off now for past few days, mostly being restarted at night when pt exhibits agitation. He is on polypharmacy however besides dilaudid IV PRN he is on his usual home medication regimen. This home medication regimen has been decreased over past 24 hours, specifically OxyContin and oxycodone.   Assessment/Plan:   Principal Problem:  Acute hypoxic respiratory failure  - Perhaps secondary to narcotics. CT chest angio negative for PE. CXR showed pulmonary vascular congestion  - dillaudid was d/c'ed by PCCM and pt subsequently left AMA Active problems:  Drug induced encephalopathy  - Secondary to dilaudid Fever  - unclear etiology; had low grade fever overnight, 99.8 F  - CXR with no signs of pneumonia; UA showed small leukocytes - treated with vanco and zosyn; then left AMA Abdominal distention identified on imaging study  - abdominal x ray  showed gas in bowel but no obstruction; pt passing flatus and had BM  - bowel regimen with colace and miralax BID  Sickle cell anemia with crisis  - Inpatient regimen: OxyContin  80 mg Q 12 hrs, oxycodone 30 mg every 6 hours PO PRN; no dilaudid due to side effect of acute delirium - Hydroxyurea and folic acid   Code Status: full code  Family Communication: no family at the bedside  Disposition Plan: left AMA  Manson Passey, MD  [facility]  Pager [phone]   Consultants:  PCCM  Procedures:  None  Antibiotics:  Vancomycin 01/24/2013 --> left AMA Zosyn 01/24/2013 --> left AMA  Signed:  Manson Passey, MD  [facility] 01/29/2013, 9:12 AM  Pager #: [phone]   Discharge Exam: Filed Vitals:   01/26/13 2300  BP: 161/75  Pulse: 105  Temp:   Resp: 14   Filed Vitals:   01/26/13 2000 01/26/13 2100 01/26/13 2200 01/26/13 2300  BP: 157/71 152/72 160/64 161/75  Pulse: 100 102 101 105  Temp: 98.8 F (37.1 C)     TempSrc: Oral     Resp: 16 10 15 14   Height:      Weight:      SpO2: 93% 90% 99% 92%    Physical exam: pt left [facility]  Discharge Instructions   Future Appointments Provider Department Dept Phone   02/26/2013 10:30 AM [facility] [phone]   02/26/2013 11:00 AM Josph Macho, MD [facility] [phone]   02/26/2013 11:30 AM [facility] [phone]       Medication List    ASK your doctor about these medications       ALPRAZolam 1 MG tablet  Commonly known as:  XANAX  Take 1 mg by mouth 3 (three) times daily as needed for anxiety.     folic acid 1 MG tablet  Commonly known as:  FOLVITE  Take 1 mg by mouth daily.     hydroxyurea 500 MG capsule  Commonly known as:  HYDREA  Take 2 capsules (1,000 mg total) by mouth daily. May take with food to minimize GI side effects.     multivitamin with minerals Tabs tablet   Take 1 tablet by mouth daily.     oxyCODONE 80 MG 12 hr tablet  Commonly known as:  OXYCONTIN  Take 1 tablet (80 mg total) by mouth every 12 (twelve) hours.     oxycodone 30 MG immediate release tablet  Commonly known as:  ROXICODONE  Take 1 tablet (30 mg total) by mouth every 6 (six) hours as needed.     PREVIDENT 5000 BOOSTER PLUS 1.1 % Pste  Generic drug:  Sodium Fluoride  Take by mouth as needed.     promethazine 25 MG tablet  Commonly known as:  PHENERGAN  Take 1 tablet (25 mg total) by mouth every 6 (six) hours as needed for nausea.     VITAMIN B 12 PO  Take 1 tablet by mouth.          The results of significant diagnostics from this hospitalization (including imaging, microbiology, ancillary and laboratory) are listed below for reference.    Significant Diagnostic Studies: Ct Angio Chest Pe W/cm &/or Wo Cm  01/23/2013   CLINICAL DATA:  History of sickle cell disease. Question pulmonary embolism.  EXAM: CT ANGIOGRAPHY CHEST WITH CONTRAST  TECHNIQUE: Multidetector CT imaging of the chest was performed using the standard protocol during bolus administration of intravenous contrast. Multiplanar CT image reconstructions including MIPs were obtained to evaluate the vascular anatomy.  CONTRAST:  OMNIPAQUE IOHEXOL 350 MG/ML SOLN  COMPARISON:  02/04/2012 and multiple priors  FINDINGS: Assessment for pulmonary emboli is limited by patient motion and contrast bolus timing. No large or central pulmonary emboli are identified. No pericardial effusion. The heart is enlarged. Thoracic aorta is normal in caliber. Visualized thyroid is unremarkable.  No pathologically enlarged hilar, mediastinal, or axillary lymph nodes are seen. Airways are patent. Ground-glass opacities noted in the lower lobes, likely reflecting mild edema and atelectasis. No pleural effusion or pneumothorax is identified.  The stomach is partially visualized and significantly distended with fluid and air. Visualized  upper abdomen is otherwise unremarkable. No evidence of acute osseous abnormality. There are sclerotic changes in endplate and can cavities, typical of sickle cell anemia.  Review of the MIP images confirms the above findings.  IMPRESSION: 1. No large or central pulmonary emboli. Examination limitations as above.  2.  Cardiomegaly.  3.  Evidence of mild pulmonary edema.  4.  Stomach significantly distended with air and fluid.   Electronically Signed   By: Jerene Dilling M.D.   On: 01/23/2013 12:29   Dg Chest Port 1 View  01/23/2013   CLINICAL DATA:  Hypoxia. Sickle cell.  EXAM: PORTABLE CHEST - 1 VIEW  COMPARISON:  Chest radiograph 02/05/2012.  FINDINGS: Stable cardiomegaly. Pulmonary vascular congestion as well as mild interstitial pulmonary edema. No large consolidative pulmonary opacities. No pleural effusion or pneumothorax. Osseous sequelae of sickle cell.  IMPRESSION: Cardiomegaly  and pulmonary vascular congestion with mild interstitial edema.   Electronically Signed   By: Annia Belt M.D.   On: 01/23/2013 11:26   Dg Abd Portable 1v  01/24/2013   CLINICAL DATA:  Followup abdominal distention.  EXAM: PORTABLE ABDOMEN - 1 VIEW  COMPARISON:  None.  FINDINGS: Increased air is noted in the bowel, but there is no significant bowel dilation to suggest obstruction.  Clips are upper quadrant reflect a prior cholecystectomy. The soft tissues are otherwise unremarkable.  There are bone changes of longstanding sickle cell disease.  IMPRESSION: Increased bowel gas, but no evidence of obstruction.   Electronically Signed   By: Amie Portland M.D.   On: 01/24/2013 07:55    Microbiology: Recent Results (from the past 240 hour(s))  MRSA PCR SCREENING     Status: None   Collection Time    01/23/13  7:24 AM      Result Value Range Status   MRSA by PCR NEGATIVE  NEGATIVE Final   Comment:            The GeneXpert MRSA Assay (FDA     approved for NASAL specimens     only), is one component of a      comprehensive MRSA colonization     surveillance program. It is not     intended to diagnose MRSA     infection nor to guide or     monitor treatment for     MRSA infections.  URINE CULTURE     Status: None   Collection Time    01/24/13  8:20 AM      Result Value Range Status   Specimen Description URINE, CATHETERIZED   Final   Special Requests NONE   Final   Culture  Setup Time     Final   Value: 01/24/2013 16:41     Performed at [facility] Count     Final   Value: NO GROWTH     Performed at [facility]   Culture     Final   Value: NO GROWTH     Performed at [facility]   Report Status 01/25/2013 FINAL   Final  CULTURE, BLOOD (ROUTINE X 2)     Status: None   Collection Time    01/24/13 11:16 AM      Result Value Range Status   Specimen Description BLOOD LEFT HAND   Final   Special Requests     Final   Value: BOTTLES DRAWN AEROBIC AND ANAEROBIC 1.5CC BOTH BOTTLES   Culture  Setup Time     Final   Value: 01/24/2013 17:41     Performed at [facility]   Culture     Final   Value:        BLOOD CULTURE RECEIVED NO GROWTH TO DATE CULTURE WILL BE HELD FOR 5 DAYS BEFORE ISSUING A FINAL NEGATIVE REPORT     Performed at [facility]   Report Status PENDING   Incomplete  CULTURE, BLOOD (ROUTINE X 2)     Status: None   Collection Time    01/24/13 11:21 AM      Result Value Range Status   Specimen Description BLOOD LEFT ARM   Final   Special Requests     Final   Value: BOTTLES DRAWN AEROBIC AND ANAEROBIC 1.5CC BOTH BOTTLES   Culture  Setup Time     Final   Value: 01/24/2013 17:41  Performed at [facility]     Final   Value:        BLOOD CULTURE RECEIVED NO GROWTH TO DATE CULTURE WILL BE HELD FOR 5 DAYS BEFORE ISSUING A FINAL NEGATIVE REPORT     Performed at [facility]   Report Status PENDING   Incomplete     Labs: Basic Metabolic Panel:  Recent Labs Lab 01/23/13 1240  NA 141  K 4.3  CL  111  CO2 24  GLUCOSE 122*  BUN 20  CREATININE 1.29  CALCIUM 8.4   Liver Function Tests:  Recent Labs Lab 01/23/13 1240  AST 91*  ALT 33  ALKPHOS 73  BILITOT 3.9*  PROT 6.5  ALBUMIN 3.7   No results found for this basename: LIPASE, AMYLASE,  in the last 168 hours No results found for this basename: AMMONIA,  in the last 168 hours CBC:  Recent Labs Lab 01/23/13 1240  WBC 13.8*  HGB 8.5*  HCT 22.0*  MCV 80.6  PLT 220   Cardiac Enzymes: No results found for this basename: CKTOTAL, CKMB, CKMBINDEX, TROPONINI,  in the last 168 hours BNP: BNP (last 3 results)  Recent Labs  02/04/12 1641  PROBNP 158.2*   CBG: No results found for this basename: GLUCAP,  in the last 168 hours  Time coordinating discharge: Over 30 minutes

## 2013-01-30 LAB — CULTURE, BLOOD (ROUTINE X 2): Culture: NO GROWTH

## 2013-02-06 DIAGNOSIS — F419 Anxiety disorder, unspecified: Secondary | ICD-10-CM | POA: Insufficient documentation

## 2013-02-24 ENCOUNTER — Ambulatory Visit: Payer: Medicare Other | Admitting: Hematology & Oncology

## 2013-02-24 ENCOUNTER — Other Ambulatory Visit: Payer: Medicare Other | Admitting: Lab

## 2013-02-25 ENCOUNTER — Telehealth: Payer: Self-pay | Admitting: Hematology & Oncology

## 2013-02-25 NOTE — Telephone Encounter (Signed)
Pt aware of 12-19 appointment and that he has to come in early for lab and could wait up to an hour to see MD

## 2013-02-25 NOTE — Telephone Encounter (Signed)
Pt called cx 11-20 said he was in the hospital wanted to reschedule for Friday. Transferred call to RN for triage. Pt also mentioned needing meds

## 2013-02-26 ENCOUNTER — Other Ambulatory Visit: Payer: Medicare Other | Admitting: Lab

## 2013-02-26 ENCOUNTER — Ambulatory Visit: Payer: Medicare Other | Admitting: Hematology & Oncology

## 2013-02-26 ENCOUNTER — Encounter: Payer: Self-pay | Admitting: *Deleted

## 2013-02-27 ENCOUNTER — Telehealth: Payer: Self-pay | Admitting: *Deleted

## 2013-02-27 NOTE — Telephone Encounter (Signed)
Spoke with Dr Erling Cruz who has been the attending MD for [name] since admission to [facility]. She plans to D/C him home on 03/01/13. He has been admitted twice since leaving [facility] AMA. She is going to wean him off the PCA and transition him over to his chronic meds. Agreement was made to provide him with a weeks worth of Oxycontin and Oxycodone upon discharge with future rxs coming from Dr Myna Hidalgo. He has a f/u appt with him on 03/27/13. Will provide additional rxs to get him through to his appt per Dr Myna Hidalgo. Dr. Jennette Kettle will instruct him to call the office next week.

## 2013-03-11 ENCOUNTER — Other Ambulatory Visit: Payer: Self-pay | Admitting: *Deleted

## 2013-03-11 DIAGNOSIS — D5700 Hb-SS disease with crisis, unspecified: Secondary | ICD-10-CM

## 2013-03-11 MED ORDER — OXYCODONE HCL ER 80 MG PO T12A: 80.0000 mg | EXTENDED_RELEASE_TABLET | Freq: Two times a day (BID) | ORAL | Status: DC

## 2013-03-11 MED ORDER — OXYCODONE HCL 30 MG PO TABS: 30.0000 mg | ORAL_TABLET | Freq: Four times a day (QID) | ORAL | Status: DC | PRN

## 2013-03-11 NOTE — Telephone Encounter (Signed)
Pt called this morning requesting rx for Oxycontin and Roxicodone. He last had his narcotics filled on 03/01/13 at [facility] for a 7 day supply. He was given a weeks worth and states he has been out for 3 days.  Rx's routed to Dr Myna Hidalgo for approval. Will place at the front desk for pick up after signed.

## 2013-03-16 NOTE — Progress Notes (Signed)
DIAGNOSIS:  Hemoglobin SS disease.  CURRENT THERAPY: 1. Folic acid 1 mg p.o. daily. 2. Hydrea 500 mg p.o. b.i.d.  INTERIM HISTORY:  [name] comes in for his followup.  He is doing fairly well.  He had a little bit of nausea.  This has been going on for a couple weeks.  We will go ahead and give him a little Phenergan to take if he needs it.  He has had no real abdominal pain.  He has had no sickle cell crises. He has had no cough or shortness of breath.  There has been no change in bowel or bladder habits.  There has been no diarrhea.  He has had no fever.  There has been no leg swelling.  When we last saw him in July, his ferritin was 333 with an iron saturation of 43%.  PHYSICAL EXAMINATION:  General:  This is a well-developed, well- nourished African American gentleman in no obvious distress.  Vital Signs:  Temperature of 98.7, pulse 80, respiratory rate 16, blood pressure 119/59.  Weight is 163.  Head and Neck:  Normocephalic, atraumatic skull.  There are no ocular or oral lesions.  There are no palpable cervical or supraclavicular lymph nodes.  Lungs:  Clear bilaterally.  Cardiac:  Regular rate and rhythm with normal S1 and S2. There are no murmurs, rubs, or bruits.  Abdomen:  Soft.  He has good bowel sounds.  There is no fluid wave.  There is no palpable hepatosplenomegaly.  Extremities:  No clubbing, cyanosis, or edema.  He has good range of motion of his joints.  He has good muscle strength in the upper and lower extremities.  Skin:  No rashes, ecchymosis, or petechia.  There are no ulcerations in his lower legs.  Neurological: No focal neurological deficits.  LABORATORY STUDIES:  White cell count is 7.1, hemoglobin 10.4, hematocrit 28.6, platelet count 286.  MCV is 87.  IMPRESSION:  [name] is a 32 year old African American gentleman with hemoglobin SS disease.  So far, he has been doing fairly well.  He has been out of the hospital now, I think, for about 8  months or so.  He is on OxyContin and oxycodone, which have helped him quite a bit.  He is very functional.  I do not think we have to phlebotomize him right now.  We will go ahead and see if the Phenergan can help with his nausea that he has.  I am not sure as to why he has the nausea.  He certainly does not have a gallbladder.  There is no evidence of iron overload.  It may be a viral syndrome that he could have had.  I will go ahead and plan to get him back in 2 more months.  I do not see that we need any blood work in between visits.    ______________________________ Josph Macho, M.D. PRE/MEDQ  D:  12/25/2012  T:  03/15/2013  Job:  [id number]

## 2013-03-27 ENCOUNTER — Ambulatory Visit (HOSPITAL_BASED_OUTPATIENT_CLINIC_OR_DEPARTMENT_OTHER): Payer: Medicare Other | Admitting: Hematology & Oncology

## 2013-03-27 ENCOUNTER — Telehealth: Payer: Self-pay | Admitting: Hematology & Oncology

## 2013-03-27 ENCOUNTER — Other Ambulatory Visit (HOSPITAL_BASED_OUTPATIENT_CLINIC_OR_DEPARTMENT_OTHER): Payer: Medicare Other | Admitting: Lab

## 2013-03-27 VITALS — BP 132/70 | HR 90 | Temp 98.7°F | Resp 18 | Ht 66.0 in | Wt 157.0 lb

## 2013-03-27 DIAGNOSIS — J189 Pneumonia, unspecified organism: Secondary | ICD-10-CM

## 2013-03-27 DIAGNOSIS — D5700 Hb-SS disease with crisis, unspecified: Secondary | ICD-10-CM

## 2013-03-27 LAB — CMP (CANCER CENTER ONLY)
ALT(SGPT): 23 U/L (ref 10–47)
Alkaline Phosphatase: 93 U/L — ABNORMAL HIGH (ref 26–84)
Glucose, Bld: 120 mg/dL — ABNORMAL HIGH (ref 73–118)
Sodium: 139 mEq/L (ref 128–145)
Total Bilirubin: 2.6 mg/dl — ABNORMAL HIGH (ref 0.20–1.60)
Total Protein: 8.2 g/dL — ABNORMAL HIGH (ref 6.4–8.1)

## 2013-03-27 LAB — CBC WITH DIFFERENTIAL (CANCER CENTER ONLY)
BASO#: 0.1 10*3/uL (ref 0.0–0.2)
BASO%: 0.6 % (ref 0.0–2.0)
EOS%: 0.9 % (ref 0.0–7.0)
HCT: 31.3 % — ABNORMAL LOW (ref 38.7–49.9)
LYMPH#: 2.1 10*3/uL (ref 0.9–3.3)
LYMPH%: 25.8 % (ref 14.0–48.0)
MCHC: 34.8 g/dL (ref 32.0–35.9)
MCV: 85 fL (ref 82–98)
MONO#: 0.8 10*3/uL (ref 0.1–0.9)
MONO%: 9.2 % (ref 0.0–13.0)
NEUT#: 5.2 10*3/uL (ref 1.5–6.5)
NEUT%: 63.5 % (ref 40.0–80.0)
RDW: 21 % — ABNORMAL HIGH (ref 11.1–15.7)

## 2013-03-27 MED ORDER — OXYCODONE HCL 30 MG PO TABS: 30.0000 mg | ORAL_TABLET | Freq: Four times a day (QID) | ORAL | Status: DC | PRN

## 2013-03-27 MED ORDER — OXYCODONE HCL ER 80 MG PO T12A: 80.0000 mg | EXTENDED_RELEASE_TABLET | Freq: Two times a day (BID) | ORAL | Status: DC

## 2013-03-27 NOTE — Telephone Encounter (Signed)
Mailed 05-07-13 schedule

## 2013-03-27 NOTE — Progress Notes (Signed)
This office note has been dictated.

## 2013-03-28 NOTE — Progress Notes (Signed)
DIAGNOSIS:  Hemoglobin SS disease.  CURRENT THERAPY: 1. Folic acid 1 mg p.o. daily. 2. Hydrea 500 mg p.o. b.i.d.  INTERIM HISTORY:  [name] comes in for a followup.  We last saw him back in September.  Unfortunately, since then, he has really had a tough time.  He apparently was admitted over to [facility] in October.  I was on vacation when he was in the hospital.  Unfortunately, the things did not typically get done.  He subsequently had to go over to [facility].  He has been seen out at [facility] before.  He actually has moved out to [facility].  His fiancee got a job out there. As such, he moved out there and they are, I guess, trying to buy a house.  He was in [facility] for probably about, I think, 10 days or so.  He is still having some discomfort.  He is not sure what triggered the last crisis that he had.  He has had no problems with cough or shortness breath.  He has had no fever.  He has had no change in bowel or bladder habits.  He has had no joint or leg swelling.  When we last checked his sickle cell studies, in September, his hemoglobin S was 83% and hemoglobin F was 14%.  As such, he typically has a higher hemoglobin S level which is protective.  He has had no bleeding.  He has had no headache.  He has had no visual issues.  PHYSICAL EXAMINATION:  General:  This is a fairly well-developed, well- nourished African American gentleman in no obvious distress.  Vital Signs:  Temperature of 98.7, pulse 90, respiratory rate 18, and blood pressure 132/70.  Weight is 157 pounds.  HEENT:  Shows a normocephalic, atraumatic skull.  There are no ocular or oral lesions.  There are no palpable cervical or supraclavicular lymph nodes.  Lungs:  Clear bilaterally.  Cardiac:  Regular rate and rhythm with a normal S1 and S2. There are no murmurs, rubs, or bruits.  Abdomen:  Soft.  He has good bowel sounds.  There is no fluid wave.  There is no palpable abdominal mass.  There  is no palpable hepatosplenomegaly.  Back:  Shows no tenderness over the spine, ribs, or hips.  Extremities:  Show no clubbing, cyanosis, or edema.  He has good range of motion of his joints.  There is no tenderness over his long bones.  Skin:  Shows no rashes, ecchymoses, or oral ulcerations.  Neurologic:  There is no neurological deficits.  LABORATORY STUDIES:  White cell count is 8.2, hemoglobin 10.9, hematocrit 31.3, platelet count 358.  MCV is 85.  His bilirubin is 2.6.  IMPRESSION:  [name] is a nice 32 year old African American gentleman with hemoglobin SS disease.  He is feeling better now.  Again, he had a pretty tough time with his last crisis.  Again, he has moved out to [facility]  He still wants to see [place].  However, I think if he has the crisis, he is probably going to go out to [facility] to be taken care of.  We would try to give him a __________ when necessary.  We have done some "mini" exchanges on him.  We typically do not do this in the office unless he really is having issues.  He continues his folic acid and Hydrea.  I went ahead and refilled his pain medication.  Since he has to come here for, I thought I  save an extra trip and we just had it dated for January 3rd.  I will plan to get him back to see me in another 6 weeks or so.  I told him to make sure that he continues on his folic acid and Hydrea. He is very diligent in taking this.  I have also told him to make sure that he stays well hydrated.    ______________________________ Josph Macho, M.D. PRE/MEDQ  D:  03/27/2013  T:  03/28/2013  Job:  [id number]

## 2013-03-30 LAB — IRON AND TIBC CHCC
%SAT: 30 % (ref 20–55)
Iron: 93 ug/dL (ref 42–163)
TIBC: 315 ug/dL (ref 202–409)

## 2013-03-30 LAB — FERRITIN CHCC: Ferritin: 131 ng/ml (ref 22–316)

## 2013-03-31 LAB — HEMOGLOBINOPATHY EVALUATION
Hgb A: 22.2 % — ABNORMAL LOW (ref 96.8–97.8)
Hgb S Quant: 65.9 % — ABNORMAL HIGH

## 2013-05-07 ENCOUNTER — Ambulatory Visit (HOSPITAL_BASED_OUTPATIENT_CLINIC_OR_DEPARTMENT_OTHER): Payer: Medicare Other | Admitting: Hematology & Oncology

## 2013-05-07 ENCOUNTER — Encounter: Payer: Self-pay | Admitting: Hematology & Oncology

## 2013-05-07 ENCOUNTER — Other Ambulatory Visit (HOSPITAL_BASED_OUTPATIENT_CLINIC_OR_DEPARTMENT_OTHER): Payer: Medicare Other | Admitting: Lab

## 2013-05-07 ENCOUNTER — Ambulatory Visit: Payer: Medicare Other

## 2013-05-07 VITALS — BP 124/69 | HR 83 | Temp 98.7°F | Resp 18 | Ht 65.0 in | Wt 160.0 lb

## 2013-05-07 DIAGNOSIS — D571 Sickle-cell disease without crisis: Secondary | ICD-10-CM

## 2013-05-07 DIAGNOSIS — D5700 Hb-SS disease with crisis, unspecified: Secondary | ICD-10-CM

## 2013-05-07 LAB — IRON AND TIBC CHCC
%SAT: 65 % — AB (ref 20–55)
IRON: 190 ug/dL — AB (ref 42–163)
TIBC: 293 ug/dL (ref 202–409)
UIBC: 103 ug/dL — AB (ref 117–376)

## 2013-05-07 LAB — CBC WITH DIFFERENTIAL (CANCER CENTER ONLY)
BASO#: 0.1 10*3/uL (ref 0.0–0.2)
BASO%: 0.7 % (ref 0.0–2.0)
EOS%: 2.6 % (ref 0.0–7.0)
Eosinophils Absolute: 0.2 10*3/uL (ref 0.0–0.5)
HCT: 27.9 % — ABNORMAL LOW (ref 38.7–49.9)
HGB: 10 g/dL — ABNORMAL LOW (ref 13.0–17.1)
LYMPH#: 2.9 10*3/uL (ref 0.9–3.3)
LYMPH%: 32.8 % (ref 14.0–48.0)
MCH: 30.6 pg (ref 28.0–33.4)
MCHC: 35.8 g/dL (ref 32.0–35.9)
MCV: 85 fL (ref 82–98)
MONO#: 1.2 10*3/uL — ABNORMAL HIGH (ref 0.1–0.9)
MONO%: 12.8 % (ref 0.0–13.0)
NEUT#: 4.6 10*3/uL (ref 1.5–6.5)
NEUT%: 51.1 % (ref 40.0–80.0)
PLATELETS: 301 10*3/uL (ref 145–400)
RBC: 3.27 10*6/uL — ABNORMAL LOW (ref 4.20–5.70)
RDW: 22.9 % — AB (ref 11.1–15.7)
WBC: 9 10*3/uL (ref 4.0–10.0)

## 2013-05-07 LAB — CMP (CANCER CENTER ONLY)
ALBUMIN: 4 g/dL (ref 3.3–5.5)
ALT(SGPT): 26 U/L (ref 10–47)
AST: 50 U/L — ABNORMAL HIGH (ref 11–38)
Alkaline Phosphatase: 97 U/L — ABNORMAL HIGH (ref 26–84)
BUN, Bld: 7 mg/dL (ref 7–22)
CALCIUM: 9.2 mg/dL (ref 8.0–10.3)
CO2: 27 meq/L (ref 18–33)
Chloride: 105 mEq/L (ref 98–108)
Creat: 0.9 mg/dl (ref 0.6–1.2)
GLUCOSE: 82 mg/dL (ref 73–118)
Potassium: 4 mEq/L (ref 3.3–4.7)
Sodium: 139 mEq/L (ref 128–145)
TOTAL PROTEIN: 7.8 g/dL (ref 6.4–8.1)
Total Bilirubin: 3.1 mg/dl — ABNORMAL HIGH (ref 0.20–1.60)

## 2013-05-07 LAB — TECHNOLOGIST REVIEW CHCC SATELLITE

## 2013-05-07 LAB — FERRITIN CHCC: Ferritin: 354 ng/ml — ABNORMAL HIGH (ref 22–316)

## 2013-05-07 MED ORDER — OXYCODONE HCL ER 80 MG PO T12A: 80.0000 mg | EXTENDED_RELEASE_TABLET | Freq: Two times a day (BID) | ORAL | Status: DC

## 2013-05-07 MED ORDER — OXYCODONE HCL 30 MG PO TABS: 30.0000 mg | ORAL_TABLET | Freq: Four times a day (QID) | ORAL | Status: DC | PRN

## 2013-05-11 ENCOUNTER — Ambulatory Visit: Payer: Medicare Other | Admitting: Hematology & Oncology

## 2013-05-11 ENCOUNTER — Other Ambulatory Visit: Payer: Medicare Other | Admitting: Lab

## 2013-05-11 LAB — HEMOGLOBINOPATHY EVALUATION
HGB A2 QUANT: 2.9 % (ref 2.2–3.2)
HGB F QUANT: 11.9 % — AB (ref 0.0–2.0)
Hemoglobin Other: 0 %
Hgb A: 11.2 % — ABNORMAL LOW (ref 96.8–97.8)
Hgb S Quant: 74 % — ABNORMAL HIGH

## 2013-05-11 LAB — RETICULOCYTES (CHCC)
ABS Retic: 461.5 10*3/uL — ABNORMAL HIGH (ref 19.0–186.0)
RBC.: 3.32 MIL/uL — ABNORMAL LOW (ref 4.22–5.81)
Retic Ct Pct: 13.9 % — ABNORMAL HIGH (ref 0.4–2.3)

## 2013-05-11 LAB — LACTATE DEHYDROGENASE: LDH: 450 U/L — AB (ref 94–250)

## 2013-05-11 NOTE — Progress Notes (Signed)
This office note has been dictated.

## 2013-05-12 NOTE — Progress Notes (Signed)
DIAGNOSIS:  Hemoglobin SS disease.  CURRENT THERAPY: 1. Folic acid 1 mg p.o. daily. 2. Hydrea 500 mg p.o. b.i.d.  INTERIM HISTORY:  [name] comes in for follow up.  He is doing fairly well.  He has had no issues with septic crisis.  He is on OxyContin and oxycodone.  These do seem to help him quite a bit.  He has had no problems with the flu.  There has been no fever.  He has had no change in bowel or bladder habits.  We never had to worry about iron overload with him to date.  When we last saw him in December, his hemoglobin A level was 22%, hemoglobin S was 66%.  He has had no rash.  There has been no skin ulcer.  He has had no leg swelling.  PHYSICAL EXAMINATION:  General:  This is a well-developed, well- nourished black gentleman in no obvious distress.  Vital Signs: Temperature of 98.7, pulse 83, respiratory rate 18, blood pressure is 124/69, and weight is 160 pounds.  Head and Neck:  Normocephalic and atraumatic skull.  There are no ocular or oral lesions.  There are no palpable cervical or supraclavicular lymph nodes.  Lungs:  Clear bilaterally.  Cardiac:  Regular rate and rhythm with a normal S1, S2. There are no murmurs, rubs, or bruits.  Abdomen:  Soft.  He has good bowel sounds.  There is no fluid wave.  There is no palpable hepatosplenomegaly.  Back:  No tenderness over the spine, ribs, or hips. Extremities:  No clubbing, cyanosis, or edema.  He has good range motion of the joints.  Skin:  No rashes or ulcerations.  LABORATORY STUDIES:  White cell count is 9.0, hemoglobin 10, hematocrit 28, platelet count 301.  Retic count is 14%.  Ferritin is 354 with an iron saturation of 65%.  IMPRESSION:  [name] is a nice 33 year old African American gentleman.  He has hemoglobin SS disease.  He is doing well.  He lives in [place] now.  I think that if he has a crisis, he probably will [facility].  He has been seen there before.  I will plan to get him back to  see me in another couple months.  I do like to watch his hemoglobin.  Sometimes, we do phlebotomize him just to make sure his hemoglobin does not get too high, which can cause some hyperviscosity issues.    ______________________________ [name], M.D. PRE/MEDQ  D:  05/11/2013  T:  05/12/2013  Job:  [id number]

## 2013-06-09 ENCOUNTER — Other Ambulatory Visit: Payer: Self-pay | Admitting: *Deleted

## 2013-06-09 ENCOUNTER — Ambulatory Visit: Payer: Self-pay | Admitting: Family Medicine

## 2013-06-09 DIAGNOSIS — D5700 Hb-SS disease with crisis, unspecified: Secondary | ICD-10-CM

## 2013-06-09 MED ORDER — OXYCODONE HCL 30 MG PO TABS: 30.0000 mg | ORAL_TABLET | Freq: Four times a day (QID) | ORAL | Status: DC | PRN

## 2013-06-09 MED ORDER — OXYCODONE HCL ER 80 MG PO T12A: 80.0000 mg | EXTENDED_RELEASE_TABLET | Freq: Two times a day (BID) | ORAL | Status: DC

## 2013-07-08 ENCOUNTER — Other Ambulatory Visit: Payer: Self-pay | Admitting: Nurse Practitioner

## 2013-07-08 DIAGNOSIS — D5700 Hb-SS disease with crisis, unspecified: Secondary | ICD-10-CM

## 2013-07-09 ENCOUNTER — Ambulatory Visit (HOSPITAL_BASED_OUTPATIENT_CLINIC_OR_DEPARTMENT_OTHER): Payer: Medicare Other

## 2013-07-09 ENCOUNTER — Telehealth: Payer: Self-pay | Admitting: Hematology & Oncology

## 2013-07-09 ENCOUNTER — Other Ambulatory Visit (HOSPITAL_BASED_OUTPATIENT_CLINIC_OR_DEPARTMENT_OTHER): Payer: Medicare Other | Admitting: Lab

## 2013-07-09 ENCOUNTER — Ambulatory Visit (HOSPITAL_BASED_OUTPATIENT_CLINIC_OR_DEPARTMENT_OTHER): Payer: Medicare Other | Admitting: Hematology & Oncology

## 2013-07-09 ENCOUNTER — Other Ambulatory Visit: Payer: Self-pay | Admitting: *Deleted

## 2013-07-09 VITALS — BP 122/71 | HR 76 | Temp 98.7°F | Resp 14 | Wt 162.0 lb

## 2013-07-09 DIAGNOSIS — J40 Bronchitis, not specified as acute or chronic: Secondary | ICD-10-CM

## 2013-07-09 DIAGNOSIS — D5700 Hb-SS disease with crisis, unspecified: Secondary | ICD-10-CM

## 2013-07-09 DIAGNOSIS — D571 Sickle-cell disease without crisis: Secondary | ICD-10-CM

## 2013-07-09 LAB — CBC WITH DIFFERENTIAL (CANCER CENTER ONLY)
BASO#: 0.1 10*3/uL (ref 0.0–0.2)
BASO%: 0.5 % (ref 0.0–2.0)
EOS ABS: 0.2 10*3/uL (ref 0.0–0.5)
EOS%: 1.6 % (ref 0.0–7.0)
HEMATOCRIT: 30 % — AB (ref 38.7–49.9)
HEMOGLOBIN: 11 g/dL — AB (ref 13.0–17.1)
LYMPH#: 2.3 10*3/uL (ref 0.9–3.3)
LYMPH%: 21.2 % (ref 14.0–48.0)
MCH: 30.6 pg (ref 28.0–33.4)
MCHC: 36.7 g/dL — ABNORMAL HIGH (ref 32.0–35.9)
MCV: 84 fL (ref 82–98)
MONO#: 1.3 10*3/uL — AB (ref 0.1–0.9)
MONO%: 11.5 % (ref 0.0–13.0)
NEUT#: 7.1 10*3/uL — ABNORMAL HIGH (ref 1.5–6.5)
NEUT%: 65.2 % (ref 40.0–80.0)
Platelets: 298 10*3/uL (ref 145–400)
RBC: 3.59 10*6/uL — AB (ref 4.20–5.70)
RDW: 22.5 % — AB (ref 11.1–15.7)

## 2013-07-09 LAB — TECHNOLOGIST REVIEW CHCC SATELLITE

## 2013-07-09 MED ORDER — OXYCODONE HCL ER 80 MG PO T12A: 80.0000 mg | EXTENDED_RELEASE_TABLET | Freq: Two times a day (BID) | ORAL | Status: DC

## 2013-07-09 MED ORDER — OXYCODONE HCL 30 MG PO TABS: 30.0000 mg | ORAL_TABLET | Freq: Four times a day (QID) | ORAL | Status: DC | PRN

## 2013-07-09 MED ORDER — DEXTROSE 5 % IV SOLN
2.0000 g | Freq: Once | INTRAVENOUS | Status: AC
  Administered 2013-07-09: 2 g via INTRAVENOUS
  Filled 2013-07-09: qty 2

## 2013-07-09 MED ORDER — SODIUM CHLORIDE 0.9 % IV SOLN
Freq: Once | INTRAVENOUS | Status: AC
  Administered 2013-07-09: 14:00:00 via INTRAVENOUS

## 2013-07-09 NOTE — Patient Instructions (Signed)
Ceftriaxone injection What is this medicine? CEFTRIAXONE (sef try AX one) is a cephalosporin antibiotic. It is used to treat certain kinds of bacterial infections. It will not work for colds, flu, or other viral infections. This medicine may be used for other purposes; ask your health care provider or pharmacist if you have questions. COMMON BRAND NAME(S): Rocephin What should I tell my health care provider before I take this medicine? They need to know if you have any of these conditions: -any chronic illness -bowel disease, like colitis -both kidney and liver disease -high bilirubin level in newborn patients -an unusual or allergic reaction to ceftriaxone, other cephalosporin or penicillin antibiotics, foods, dyes or preservatives -pregnant or trying to get pregnant -breast-feeding How should I use this medicine? This medicine is injected into a muscle or infused it into a vein. It is usually given in a medical office or clinic. If you are to give this medicine you will be taught how to inject it. Follow instructions carefully. Use your doses at regular intervals. Do not take your medicine more often than directed. Do not skip doses or stop your medicine early even if you feel better. Do not stop taking except on your doctor's advice. Talk to your pediatrician regarding the use of this medicine in children. Special care may be needed. Overdosage: If you think you have taken too much of this medicine contact a poison control center or emergency room at once. NOTE: This medicine is only for you. Do not share this medicine with others. What if I miss a dose? If you miss a dose, take it as soon as you can. If it is almost time for your next dose, take only that dose. Do not take double or extra doses. What may interact with this medicine? Do not take this medicine with any of the following medications: -intravenous calcium This list may not describe all possible interactions. Give your health  care provider a list of all the medicines, herbs, non-prescription drugs, or dietary supplements you use. Also tell them if you smoke, drink alcohol, or use illegal drugs. Some items may interact with your medicine. What should I watch for while using this medicine? Tell your doctor or health care professional if your symptoms do not improve or if they get worse. Do not treat diarrhea with over the counter products. Contact your doctor if you have diarrhea that lasts more than 2 days or if it is severe and watery. If you are being treated for a sexually transmitted disease, avoid sexual contact until you have finished your treatment. Having sex can infect your sexual partner. Calcium may bind to this medicine and cause lung or kidney problems. Avoid calcium products while taking this medicine and for 48 hours after taking the last dose of this medicine. What side effects may I notice from receiving this medicine? Side effects that you should report to your doctor or health care professional as soon as possible: -allergic reactions like skin rash, itching or hives, swelling of the face, lips, or tongue -breathing problems -fever, chills -irregular heartbeat -pain when passing urine -seizures -stomach pain, cramps -unusual bleeding, bruising -unusually weak or tired Side effects that usually do not require medical attention (report to your doctor or health care professional if they continue or are bothersome): -diarrhea -dizzy, drowsy -headache -nausea, vomiting -pain, swelling, irritation where injected -stomach upset -sweating This list may not describe all possible side effects. Call your doctor for medical advice about side effects. You may report  side effects to FDA at 1-800-FDA-1088. Where should I keep my medicine? Keep out of the reach of children. Store at room temperature below 25 degrees C (77 degrees F). Protect from light. Throw away any unused vials after the expiration  date. NOTE: This sheet is a summary. It may not cover all possible information. If you have questions about this medicine, talk to your doctor, pharmacist, or health care provider.  2014, Elsevier/Gold Standard. (2012-04-07 15:34:57)    Therapeutic Phlebotomy Care After Refer to this sheet in the next few weeks. These instructions provide you with information on caring for yourself after your procedure. Your caregiver may also give you more specific instructions. Your treatment has been planned according to current medical practices, but problems sometimes occur. Call your caregiver if you have any problems or questions after your procedure. HOME CARE INSTRUCTIONS Most people can go back to their normal activities right away. Before you leave, be sure to ask if there is anything you should or should not do. In general, it would be wise to:  Keep the bandage dry. You can remove the bandage after about 5 hours.  Eat well-balanced meals for the next 24 hours.  Drink enough fluids to keep your urine clear or pale yellow.  Avoid drinking alcohol minimally until after eating.  Avoid smoking for at least 30 minutes after the procedure.  Avoid strenous physical activity or heavy lifting or pulling for about 5 hours after the procedure.  Athletes should avoid strenous exercise for 12 hours or more.  Change positions slowly for the remainder of the day to prevent lightheadedness or fainting.  If you feel lightheaded, lie down until the feeling subsides.  If you have bleeding from the needle insertion site, elevate your arm and press firmly on the site until the bleeding stops.  If bruising or bleeding appears under the skin, apply ice to the area for 15 to 20 minutes, 3 to 4 times per day. Put the ice in a plastic bag and place a towel between the bag of ice and your skin. Do this while you are awake for the first 24 hours. The ice packs can be stopped before 24 hours if the swelling goes  away. If swelling persists after 24 hours, a warm, moist washcloth can be applied to the area for 15 to 20 minutes, 3 to 4 times per day. The warm, moist treatments can be stopped when the swelling goes away.  It is important to continue further therapeutic phlebotomy as directed by your caregiver. SEEK MEDICAL CARE IF:  There is bleeding or fluid leaking from the needle insertion site.  The needle insertion site becomes swollen, red, or sore.  You feel lightheaded, dizzy or nauseated, and the feeling does not go away.  You notice new bruising at the needle insertion site.  You feel more weak or tired than normal.  You develop a fever. SEEK IMMEDIATE MEDICAL CARE IF:   There is increased bleeding, pain, or swelling from the needle insertion site.  You have severe nausea or vomiting.  You have chest pain.  You have trouble breathing. MAKE SURE YOU:  Understand these instructions.  Will watch your condition.  Will get help right away if you are not doing well or get worse. Document Released: 08/28/2010 Document Revised: 06/18/2011 Document Reviewed: 08/28/2010 [facility] Patient Information 2014 [employer] [place].

## 2013-07-09 NOTE — Telephone Encounter (Signed)
Per in basket see pt back in 6 weeks, pt changed appointment to 7 weeks.

## 2013-07-09 NOTE — Progress Notes (Signed)
[name] presents today for phlebotomy per MD orders. Phlebotomy procedure started at 1357 and ended at 1420. 500 grams removed. Patient observed for 30 minutes after procedure without any incident. Patient tolerated procedure well. Phlebotomy was slow but successful. Nourishments provided.  IV needle removed intact.

## 2013-07-09 NOTE — Progress Notes (Signed)
Hematology and Oncology Follow Up Visit  JASKARN SCHWEER [id number] [birth year] 33 y.o. 07/09/2013   Principle Diagnosis:   Hemoglobin SS disease  Current Therapy:    Folic acid 1 mg by mouth daily  Hydrea 500 mg by mouth twice a day     Interim History:  Mr.  Shorey is back for followup. He's had a little bit of bronchitis over the past couple weeks. Doesn't seem to help. He went to urgent care. He had a chest x-ray done which he says did not show any pneumonia. He's been on inhalers. He's been on antibiotics. He still has a cough. The cough is productive of a greenish mucus.  We will go ahead and give him a dose of Rocephin in the office today. I will then put him on Augmentin.  He's not had any problems crisis. He does have some achiness in his bones. He's had no swelling in his joints.  There's been no fever. He's had no bleeding. He's had no headache. There's been no change in bowel or bladder habits.  His iron studies that we have been checking have been excellent. Back in January, his ferritin was 354 with an iron saturation of 65%.  Medications: Current outpatient prescriptions:ALPRAZolam (XANAX) 1 MG tablet, Take 1 mg by mouth 3 (three) times daily as needed for anxiety. , Disp: , Rfl: ;  Cyanocobalamin (VITAMIN B 12 PO), Take 1 tablet by mouth., Disp: , Rfl: ;  folic acid (FOLVITE) 1 MG tablet, Take 1 mg by mouth daily.  , Disp: , Rfl:  hydroxyurea (HYDREA) 500 MG capsule, Take 2 capsules (1,000 mg total) by mouth daily. May take with food to minimize GI side effects., Disp: 60 capsule, Rfl: 2;  Multiple Vitamin (MULITIVITAMIN WITH MINERALS) TABS, Take 1 tablet by mouth daily., Disp: , Rfl: ;  OxyCODONE (OXYCONTIN) 80 mg T12A 12 hr tablet, Take 1 tablet (80 mg total) by mouth every 12 (twelve) hours. Fill on 05/12/2013, Disp: 60 tablet, Rfl: 0 oxycodone (ROXICODONE) 30 MG immediate release tablet, Take 1 tablet (30 mg total) by mouth every 6 (six) hours as needed. Fill on 05/12/2013,  Disp: 120 tablet, Rfl: 0;  prochlorperazine (COMPAZINE) 10 MG tablet, Take 10 mg by mouth as needed. , Disp: , Rfl: ;  senna-docusate (SENOKOT-S) 8.6-50 MG per tablet, Take by mouth., Disp: , Rfl:  No current facility-administered medications for this visit. Facility-Administered Medications Ordered in Other Visits: 0.9 %  sodium chloride infusion, , Intravenous, Once, Josph Macho, MD;  cefTRIAXone (ROCEPHIN) 2 g in dextrose 5 % 50 mL IVPB, 2 g, Intravenous, Once, Josph Macho, MD  Allergies:  Allergies  Allergen Reactions  . Morphine And Related Rash  . Dilaudid [Hydromorphone Hcl]     dilusional only with 4mg  dose. Can't take "too much"  . Morphine Hives and Itching  . Promethazine Hcl Hives    Past Medical History, Surgical history, Social history, and Family History were reviewed and updated.  Review of Systems: As above  Physical Exam:  weight is 162 lb (73.483 kg). His oral temperature is 98.7 F (37.1 C). His blood pressure is 122/71 and his pulse is 76. His respiration is 14.   Well-developed well-nourished Afro-American gentleman. Lungs show some slight crackles bilaterally. She has good breath sounds bilaterally. Cardiac exam regular in rhythm with no murmurs rubs or bruits. Abdomen is soft. There is a palpable liver edge. Has the splenomegaly. Back exam shows no tenderness over the spine ribs or hips.  Extremities shows no clubbing cyanosis or edema. Neurological exam shows no focal neurological deficits. Head and neck exam shows no scleral icterus.  Lab Results  Component Value Date   WBC 10.1 Corrected for nRBC* 07/09/2013   HGB 11.0* 07/09/2013   HCT 30.0* 07/09/2013   MCV 84 07/09/2013   PLT 298 07/09/2013     Chemistry      Component Value Date/Time   NA 139 05/07/2013 1030   NA 141 01/23/2013 1240   K 4.0 05/07/2013 1030   K 4.3 01/23/2013 1240   CL 105 05/07/2013 1030   CL 111 01/23/2013 1240   CO2 27 05/07/2013 1030   CO2 24 01/23/2013 1240   BUN 7 05/07/2013  1030   BUN 20 01/23/2013 1240   CREATININE 0.9 05/07/2013 1030   CREATININE 1.29 01/23/2013 1240      Component Value Date/Time   CALCIUM 9.2 05/07/2013 1030   CALCIUM 8.4 01/23/2013 1240   ALKPHOS 97* 05/07/2013 1030   ALKPHOS 73 01/23/2013 1240   AST 50* 05/07/2013 1030   AST 91* 01/23/2013 1240   ALT 26 05/07/2013 1030   ALT 33 01/23/2013 1240   BILITOT 3.10* 05/07/2013 1030   BILITOT 3.9* 01/23/2013 1240         Impression and Plan: [name] is a 33 year old African American gentleman. He has hemoglobin SS disease. We aren't try to be aggressive with keeping him out of the hospital.  Again we will give him a dose of Rocephin. I will then put him on Augmentin for 10 days.  I will phlebotomize him 1 unit today. We will give him IV fluids.  We will go ahead and refill his medications. These have really helped him and have made him functional.  I want to see him back in about 6 weeks.  I do not see that we have to do any exchanges on him right now.    Josph MachoENNEVER,Jeferson Boozer R, MD 4/2/20152:15 PM

## 2013-07-10 ENCOUNTER — Encounter: Payer: Self-pay | Admitting: *Deleted

## 2013-07-10 ENCOUNTER — Other Ambulatory Visit: Payer: Self-pay | Admitting: *Deleted

## 2013-07-10 DIAGNOSIS — J180 Bronchopneumonia, unspecified organism: Secondary | ICD-10-CM

## 2013-07-10 LAB — FERRITIN CHCC: FERRITIN: 243 ng/mL (ref 22–316)

## 2013-07-10 LAB — IRON AND TIBC CHCC
%SAT: 30 % (ref 20–55)
IRON: 106 ug/dL (ref 42–163)
TIBC: 350 ug/dL (ref 202–409)
UIBC: 244 ug/dL (ref 117–376)

## 2013-07-10 MED ORDER — AMOXICILLIN-POT CLAVULANATE 875-125 MG PO TABS: 1.0000 | ORAL_TABLET | Freq: Two times a day (BID) | ORAL | Status: DC

## 2013-07-13 LAB — COMPREHENSIVE METABOLIC PANEL
ALT: 43 U/L (ref 0–53)
AST: 53 U/L — ABNORMAL HIGH (ref 0–37)
Albumin: 4.4 g/dL (ref 3.5–5.2)
Alkaline Phosphatase: 109 U/L (ref 39–117)
BUN: 8 mg/dL (ref 6–23)
CO2: 23 mEq/L (ref 19–32)
CREATININE: 0.78 mg/dL (ref 0.50–1.35)
Calcium: 9.4 mg/dL (ref 8.4–10.5)
Chloride: 107 mEq/L (ref 96–112)
GLUCOSE: 83 mg/dL (ref 70–99)
Potassium: 4.3 mEq/L (ref 3.5–5.3)
Sodium: 139 mEq/L (ref 135–145)
Total Bilirubin: 4.1 mg/dL — ABNORMAL HIGH (ref 0.2–1.2)
Total Protein: 7.3 g/dL (ref 6.0–8.3)

## 2013-07-13 LAB — HEMOGLOBINOPATHY EVALUATION
HGB F QUANT: 13.4 % — AB (ref 0.0–2.0)
HGB S QUANTITAION: 83.5 % — AB
Hemoglobin Other: 0 %
Hgb A2 Quant: 3.1 % (ref 2.2–3.2)
Hgb A: 0 % — ABNORMAL LOW (ref 96.8–97.8)

## 2013-07-13 LAB — RETICULOCYTES (CHCC)
ABS Retic: 487.1 10*3/uL — ABNORMAL HIGH (ref 19.0–186.0)
RBC.: 3.69 MIL/uL — ABNORMAL LOW (ref 4.22–5.81)
Retic Ct Pct: 13.2 % — ABNORMAL HIGH (ref 0.4–2.3)

## 2013-07-13 LAB — LACTATE DEHYDROGENASE: LDH: 338 U/L — AB (ref 94–250)

## 2013-08-07 ENCOUNTER — Other Ambulatory Visit: Payer: Self-pay | Admitting: Nurse Practitioner

## 2013-08-07 DIAGNOSIS — D5700 Hb-SS disease with crisis, unspecified: Secondary | ICD-10-CM

## 2013-08-07 MED ORDER — OXYCODONE HCL 30 MG PO TABS: 30.0000 mg | ORAL_TABLET | Freq: Four times a day (QID) | ORAL | Status: DC | PRN

## 2013-08-07 MED ORDER — OXYCODONE HCL ER 80 MG PO T12A: 80.0000 mg | EXTENDED_RELEASE_TABLET | Freq: Two times a day (BID) | ORAL | Status: DC

## 2013-08-20 ENCOUNTER — Other Ambulatory Visit: Payer: Medicare Other | Admitting: Lab

## 2013-08-20 ENCOUNTER — Ambulatory Visit: Payer: Medicare Other | Admitting: Hematology & Oncology

## 2013-08-27 ENCOUNTER — Ambulatory Visit (HOSPITAL_BASED_OUTPATIENT_CLINIC_OR_DEPARTMENT_OTHER): Payer: Medicare Other | Admitting: Hematology & Oncology

## 2013-08-27 ENCOUNTER — Encounter: Payer: Self-pay | Admitting: Hematology & Oncology

## 2013-08-27 ENCOUNTER — Other Ambulatory Visit (HOSPITAL_BASED_OUTPATIENT_CLINIC_OR_DEPARTMENT_OTHER): Payer: Medicare Other | Admitting: Lab

## 2013-08-27 VITALS — BP 116/66 | HR 65 | Temp 97.9°F | Resp 18 | Ht 66.0 in | Wt 166.0 lb

## 2013-08-27 DIAGNOSIS — D571 Sickle-cell disease without crisis: Secondary | ICD-10-CM

## 2013-08-27 DIAGNOSIS — D5700 Hb-SS disease with crisis, unspecified: Secondary | ICD-10-CM

## 2013-08-27 LAB — CBC WITH DIFFERENTIAL (CANCER CENTER ONLY)
BASO#: 0.1 10*3/uL (ref 0.0–0.2)
BASO%: 1.1 % (ref 0.0–2.0)
EOS%: 5.1 % (ref 0.0–7.0)
Eosinophils Absolute: 0.5 10*3/uL (ref 0.0–0.5)
HCT: 30.9 % — ABNORMAL LOW (ref 38.7–49.9)
HEMOGLOBIN: 11 g/dL — AB (ref 13.0–17.1)
LYMPH#: 2.4 10*3/uL (ref 0.9–3.3)
LYMPH%: 25.3 % (ref 14.0–48.0)
MCH: 29.4 pg (ref 28.0–33.4)
MCHC: 35.6 g/dL (ref 32.0–35.9)
MCV: 83 fL (ref 82–98)
MONO#: 0.9 10*3/uL (ref 0.1–0.9)
MONO%: 9.7 % (ref 0.0–13.0)
NEUT#: 5.6 10*3/uL (ref 1.5–6.5)
NEUT%: 58.8 % (ref 40.0–80.0)
Platelets: 200 10*3/uL (ref 145–400)
RBC: 3.74 10*6/uL — AB (ref 4.20–5.70)
RDW: 23.6 % — ABNORMAL HIGH (ref 11.1–15.7)
WBC: 8.9 10*3/uL (ref 4.0–10.0)

## 2013-08-27 LAB — CHCC SATELLITE - SMEAR

## 2013-08-27 LAB — TECHNOLOGIST REVIEW CHCC SATELLITE

## 2013-08-27 MED ORDER — OXYCODONE HCL ER 80 MG PO T12A: 80.0000 mg | EXTENDED_RELEASE_TABLET | Freq: Two times a day (BID) | ORAL | Status: DC

## 2013-08-27 MED ORDER — OXYCODONE HCL 30 MG PO TABS: ORAL_TABLET | ORAL | Status: DC

## 2013-08-28 LAB — IRON AND TIBC CHCC
%SAT: 47 % (ref 20–55)
IRON: 168 ug/dL — AB (ref 42–163)
TIBC: 359 ug/dL (ref 202–409)
UIBC: 191 ug/dL (ref 117–376)

## 2013-08-28 LAB — FERRITIN CHCC: Ferritin: 87 ng/ml (ref 22–316)

## 2013-08-29 NOTE — Progress Notes (Signed)
Hematology and Oncology Follow Up Visit  CORTNEY MCCORQUODALE [id number] Apr 04, 1981 33 y.o. 08/29/2013   Principle Diagnosis:   Hemoglobin SS disease  Current Therapy:    Folic acid 1 mg by mouth daily  Hydrea 500 mg by mouth twice a day     Interim History:  Mr.  Drakos is back for followup. He's been doing well since last saw him in April. He has had no sickle crises.  He's had no cough or shortness of breath. He did have some bronchitis. This has resolved.  He's had no fever. There's been no abdominal pain.  His pain medications have helped quite a bit. He has been functional.  There's been no issues with the Hydrea. Recheck his hemoglobin like for his back in April. He at 13% hemoglobin F. His hemoglobin S was 83%.  Medications: Current outpatient prescriptions:ALPRAZolam (XANAX) 1 MG tablet, Take 1 mg by mouth 3 (three) times daily as needed for anxiety. , Disp: , Rfl: ;  Cyanocobalamin (VITAMIN B 12 PO), Take 1 tablet by mouth., Disp: , Rfl: ;  hydroxyurea (HYDREA) 500 MG capsule, Take 2 capsules (1,000 mg total) by mouth daily. May take with food to minimize GI side effects., Disp: 60 capsule, Rfl: 2 Multiple Vitamin (MULITIVITAMIN WITH MINERALS) TABS, Take 1 tablet by mouth daily., Disp: , Rfl: ;  OxyCODONE (OXYCONTIN) 80 mg T12A 12 hr tablet, Take 1 tablet (80 mg total) by mouth every 12 (twelve) hours. Fill on 09/04/2013, Disp: 60 tablet, Rfl: 0;  oxycodone (ROXICODONE) 30 MG immediate release tablet, Fill on 09/04/2013, Disp: 120 tablet, Rfl: 0;  prochlorperazine (COMPAZINE) 10 MG tablet, Take 10 mg by mouth as needed. , Disp: , Rfl:  senna-docusate (SENOKOT-S) 8.6-50 MG per tablet, Take by mouth., Disp: , Rfl:   Allergies:  Allergies  Allergen Reactions  . Morphine And Related Rash  . Promethazine Hcl     Has hallucination when this drug mixed with other drugs  . Dilaudid [Hydromorphone Hcl]     dilusional only with 4mg  dose. Can't take "too much"  . Morphine Hives and  Itching    Past Medical History, Surgical history, Social history, and Family History were reviewed and updated.  Review of Systems: As above  Physical Exam:  height is 5\' 6"  (1.676 m) and weight is 166 lb (75.297 kg). His oral temperature is 97.9 F (36.6 C). His blood pressure is 116/66 and his pulse is 65. His respiration is 18.   Well-developed and well-nourished African American gentleman. Head and neck exam shows no ocular or oral lesions. There is no scleral icterus. Lungs are clear. Cardiac exam regular in rhythm with no murmurs rubs or bruits. Abdomen soft. Has good bowel sounds. There is no fluid wave. There is no palpable liver or spleen tip. Back exam shows no tenderness over the spine. Extremities shows no clubbing cyanosis or edema. Skin exam no rashes or ulcerations. Neurological exam is nonfocal.  Lab Results  Component Value Date   WBC 8.9 08/27/2013   HGB 11.0* 08/27/2013   HCT 30.9* 08/27/2013   MCV 83 08/27/2013   PLT 200 08/27/2013     Chemistry      Component Value Date/Time   NA 139 07/09/2013 1129   NA 139 05/07/2013 1030   K 4.3 07/09/2013 1129   K 4.0 05/07/2013 1030   CL 107 07/09/2013 1129   CL 105 05/07/2013 1030   CO2 23 07/09/2013 1129   CO2 27 05/07/2013 1030   BUN  8 07/09/2013 1129   BUN 7 05/07/2013 1030   CREATININE 0.78 07/09/2013 1129   CREATININE 0.9 05/07/2013 1030      Component Value Date/Time   CALCIUM 9.4 07/09/2013 1129   CALCIUM 9.2 05/07/2013 1030   ALKPHOS 109 07/09/2013 1129   ALKPHOS 97* 05/07/2013 1030   AST 53* 07/09/2013 1129   AST 50* 05/07/2013 1030   ALT 43 07/09/2013 1129   ALT 26 05/07/2013 1030   BILITOT 4.1* 07/09/2013 1129   BILITOT 3.10* 05/07/2013 1030      Reticulocyte count is 13%. Haptoglobin is less than 25. Ferritin is 87. Iron saturation is 47%.   Impression and Plan: [name] is 33 year old gentleman with hemoglobin SS disease. He is doing well. So far, there's been no problems with respect to crises. The high hemoglobin F  level should be helping.  We do not need to phlebotomize or do an exchange transfusion.  I'll plan to get him back in 2 months time now. I did give him prescriptions for his medications. I she gets these filled in early June [place], I don't want to all weight back here for the the prescriptions I   Josph MachoPeter R Ennever, MD 5/23/20152:26 PM

## 2013-09-01 LAB — RETICULOCYTES (CHCC)
ABS Retic: 496.1 10*3/uL — ABNORMAL HIGH (ref 19.0–186.0)
RBC.: 3.73 MIL/uL — ABNORMAL LOW (ref 4.22–5.81)
Retic Ct Pct: 13.3 % — ABNORMAL HIGH (ref 0.4–2.3)

## 2013-09-01 LAB — HEMOGLOBINOPATHY EVALUATION
HGB F QUANT: 11.2 % — AB (ref 0.0–2.0)
Hemoglobin Other: 0 %
Hgb A2 Quant: 3.4 % — ABNORMAL HIGH (ref 2.2–3.2)
Hgb A: 0 % — ABNORMAL LOW (ref 96.8–97.8)
Hgb S Quant: 85.4 % — ABNORMAL HIGH

## 2013-09-01 LAB — HAPTOGLOBIN

## 2013-09-04 ENCOUNTER — Other Ambulatory Visit: Payer: Self-pay | Admitting: Nurse Practitioner

## 2013-09-04 MED ORDER — ALPRAZOLAM 1 MG PO TABS: 1.0000 mg | ORAL_TABLET | Freq: Three times a day (TID) | ORAL | Status: DC | PRN

## 2013-10-08 ENCOUNTER — Other Ambulatory Visit: Payer: Self-pay | Admitting: *Deleted

## 2013-10-08 DIAGNOSIS — D5700 Hb-SS disease with crisis, unspecified: Secondary | ICD-10-CM

## 2013-10-08 MED ORDER — OXYCODONE HCL ER 80 MG PO T12A: 80.0000 mg | EXTENDED_RELEASE_TABLET | Freq: Two times a day (BID) | ORAL | Status: DC

## 2013-10-08 MED ORDER — OXYCODONE HCL 30 MG PO TABS: 30.0000 mg | ORAL_TABLET | Freq: Four times a day (QID) | ORAL | Status: DC | PRN

## 2013-10-29 ENCOUNTER — Ambulatory Visit (HOSPITAL_BASED_OUTPATIENT_CLINIC_OR_DEPARTMENT_OTHER): Payer: Medicare Other | Admitting: Family

## 2013-10-29 ENCOUNTER — Other Ambulatory Visit (HOSPITAL_BASED_OUTPATIENT_CLINIC_OR_DEPARTMENT_OTHER): Payer: Medicare Other | Admitting: Lab

## 2013-10-29 ENCOUNTER — Encounter: Payer: Self-pay | Admitting: Hematology & Oncology

## 2013-10-29 VITALS — BP 127/71 | HR 71 | Temp 98.2°F | Resp 18 | Ht 66.0 in | Wt 163.0 lb

## 2013-10-29 DIAGNOSIS — D5700 Hb-SS disease with crisis, unspecified: Secondary | ICD-10-CM

## 2013-10-29 DIAGNOSIS — D571 Sickle-cell disease without crisis: Secondary | ICD-10-CM

## 2013-10-29 LAB — IRON AND TIBC CHCC
%SAT: 23 % (ref 20–55)
IRON: 93 ug/dL (ref 42–163)
TIBC: 399 ug/dL (ref 202–409)
UIBC: 305 ug/dL (ref 117–376)

## 2013-10-29 LAB — CBC WITH DIFFERENTIAL (CANCER CENTER ONLY)
BASO#: 0.1 10*3/uL (ref 0.0–0.2)
BASO%: 0.7 % (ref 0.0–2.0)
EOS ABS: 0.1 10*3/uL (ref 0.0–0.5)
EOS%: 1.5 % (ref 0.0–7.0)
HCT: 32.2 % — ABNORMAL LOW (ref 38.7–49.9)
HEMOGLOBIN: 11.7 g/dL — AB (ref 13.0–17.1)
LYMPH#: 2.4 10*3/uL (ref 0.9–3.3)
LYMPH%: 28.6 % (ref 14.0–48.0)
MCH: 29.2 pg (ref 28.0–33.4)
MCHC: 36.3 g/dL — ABNORMAL HIGH (ref 32.0–35.9)
MCV: 80 fL — ABNORMAL LOW (ref 82–98)
MONO#: 1 10*3/uL — AB (ref 0.1–0.9)
MONO%: 11.4 % (ref 0.0–13.0)
NEUT%: 57.8 % (ref 40.0–80.0)
NEUTROS ABS: 4.9 10*3/uL (ref 1.5–6.5)
Platelets: 377 10*3/uL (ref 145–400)
RBC: 4.01 10*6/uL — ABNORMAL LOW (ref 4.20–5.70)
RDW: 22.4 % — ABNORMAL HIGH (ref 11.1–15.7)
WBC: 8.5 10*3/uL (ref 4.0–10.0)

## 2013-10-29 LAB — TECHNOLOGIST REVIEW CHCC SATELLITE

## 2013-10-29 LAB — FERRITIN CHCC: Ferritin: 76 ng/ml (ref 22–316)

## 2013-10-29 MED ORDER — OXYCODONE HCL ER 80 MG PO T12A: 80.0000 mg | EXTENDED_RELEASE_TABLET | Freq: Two times a day (BID) | ORAL | Status: DC

## 2013-10-29 MED ORDER — OXYCODONE HCL 30 MG PO TABS: 30.0000 mg | ORAL_TABLET | Freq: Four times a day (QID) | ORAL | Status: DC | PRN

## 2013-10-29 NOTE — Progress Notes (Signed)
[facility]  Telephone:(336) [phone] Fax:(336) [phone]  ID: [name] OB: 1980/11/13 MR#: [id number] FAO#:[id number]#:[id number] Patient Care Team: Billee CashingWayland McKenzie, MD as PCP - General (Internal Medicine) Josph MachoPeter R Ennever, MD as Consulting Physician (Oncology)  DIAGNOSIS:  Hemoglobin SS disease  INTERVAL HISTORY: [name] is back for his 2 month followup. He's been doing well since last saw him in May. He has had no sickle crisis since October. He denies fever, chills, headache, dizziness, SOB, chest pain, palpitations, cough, rash, abdominal pain, constipation, diarrhea, problems urinating, blood in urine or stool. He denies swelling, tenderness, numbness or tingling in his extremities. His energy is better and he has a good appetite. His pain medications have helped quite a bit. He has been functional. There's been no issues with the Hydrea. Recheck his hemoglobin like for his back in April. He at 13% hemoglobin F. His hemoglobin S was 83%. Overall he seems to be doing quite well.   CURRENT TREATMENT: Folic acid 1 mg by mouth daily  Hydrea 500 mg by mouth twice a day  REVIEW OF SYSTEMS: All other 10 point review of systems is negative.   PAST MEDICAL HISTORY: Past Medical History  Diagnosis Date  . Sickle cell anemia    PAST SURGICAL HISTORY: Past Surgical History  Procedure Laterality Date  . Cholecystectomy     FAMILY HISTORY Family History  Problem Relation Age of Onset  . Sickle cell trait     GYNECOLOGIC HISTORY:  No LMP for male patient.   SOCIAL HISTORY:  History   Social History  . Marital Status: Single    Spouse Name: N/A    Number of Children: N/A  . Years of Education: N/A   Occupational History  . Not on file.   Social History Main Topics  . Smoking status: Never Smoker   . Smokeless tobacco: Never Used     Comment: never used tobacco  . Alcohol Use: Yes     Comment: occassionally  . Drug Use: No  . Sexual Activity: No   Other Topics  Concern  . Not on file   Social History Narrative  . No narrative on file   ADVANCED DIRECTIVES: <no information>  HEALTH MAINTENANCE: History  Substance Use Topics  . Smoking status: Never Smoker   . Smokeless tobacco: Never Used     Comment: never used tobacco  . Alcohol Use: Yes     Comment: occassionally   Colonoscopy: PAP: Bone density: Lipid panel:  Allergies  Allergen Reactions  . Morphine And Related Rash  . Promethazine Hcl     Has hallucination when this drug mixed with other drugs  . Morphine Hives and Itching    Current Outpatient Prescriptions  Medication Sig Dispense Refill  . ALPRAZolam (XANAX) 1 MG tablet Take 1 tablet (1 mg total) by mouth 3 (three) times daily as needed for anxiety.  60 tablet  2  . Cyanocobalamin (VITAMIN B 12 PO) Take 1 tablet by mouth.      . hydroxyurea (HYDREA) 500 MG capsule Take 2 capsules (1,000 mg total) by mouth daily. May take with food to minimize GI side effects.  60 capsule  2  . Multiple Vitamin (MULITIVITAMIN WITH MINERALS) TABS Take 1 tablet by mouth daily.      . OxyCODONE (OXYCONTIN) 80 mg T12A 12 hr tablet Take 1 tablet (80 mg total) by mouth every 12 (twelve) hours.  60 tablet  0  . oxycodone (ROXICODONE) 30 MG immediate release tablet  Take 1 tablet (30 mg total) by mouth every 6 (six) hours as needed for pain.  120 tablet  0  . senna-docusate (SENOKOT-S) 8.6-50 MG per tablet Take by mouth.       No current facility-administered medications for this visit.   OBJECTIVE: Filed Vitals:   10/29/13 1208  BP: 127/71  Pulse: 71  Temp: 98.2 F (36.8 C)  Resp: 18   Body mass index is 26.32 kg/(m^2). ECOG FS:0 - Asymptomatic Ocular: Sclerae unicteric, pupils equal, round and reactive to light Ear-nose-throat: Oropharynx clear, dentition fair Lymphatic: No cervical or supraclavicular adenopathy Lungs no rales or rhonchi, good excursion bilaterally Heart regular rate and rhythm, no murmur appreciated Abd soft,  nontender, positive bowel sounds MSK no focal spinal tenderness, no joint edema Neuro: non-focal, well-oriented, appropriate affect  LAB RESULTS: CMP     Component Value Date/Time   NA 139 07/09/2013 1129   NA 139 05/07/2013 1030   K 4.3 07/09/2013 1129   K 4.0 05/07/2013 1030   CL 107 07/09/2013 1129   CL 105 05/07/2013 1030   CO2 23 07/09/2013 1129   CO2 27 05/07/2013 1030   GLUCOSE 83 07/09/2013 1129   GLUCOSE 82 05/07/2013 1030   BUN 8 07/09/2013 1129   BUN 7 05/07/2013 1030   CREATININE 0.78 07/09/2013 1129   CREATININE 0.9 05/07/2013 1030   CALCIUM 9.4 07/09/2013 1129   CALCIUM 9.2 05/07/2013 1030   PROT 7.3 07/09/2013 1129   PROT 7.8 05/07/2013 1030   ALBUMIN 4.4 07/09/2013 1129   AST 53* 07/09/2013 1129   AST 50* 05/07/2013 1030   ALT 43 07/09/2013 1129   ALT 26 05/07/2013 1030   ALKPHOS 109 07/09/2013 1129   ALKPHOS 97* 05/07/2013 1030   BILITOT 4.1* 07/09/2013 1129   BILITOT 3.10* 05/07/2013 1030   GFRNONAA 72* 01/23/2013 1240   GFRAA 83* 01/23/2013 1240   No results found for this basename: SPEP, UPEP,  kappa and lambda light chains   Lab Results  Component Value Date   WBC 8.5 10/29/2013   NEUTROABS 4.9 10/29/2013   HGB 11.7* 10/29/2013   HCT 32.2* 10/29/2013   MCV 80* 10/29/2013   PLT 377 10/29/2013   No results found for this basename: LABCA2   No components found with this basename: [id number]   No results found for this basename: INR,  in the last 168 hours  STUDIES: No results found.  ASSESSMENT/PLAN: [name] is 33 year old gentleman with hemoglobin SS disease. He is doing well. So far, there's been no problems with respect to crisis. We do not need to phlebotomize or do an exchange transfusion.  He will continue on the same medication regimen as this appears to be working for him.  We will see him back in 3 months for labs and follow-up.   We did give him prescriptions for his pain medications.  All questions were answered an he is in agreement. He knows to call here with any  questions or concerns and to go to the ED in the event of an emergency. We can certainly see him sooner if need be.   [name] Mosher, NP 10/29/2013 12:48 PM

## 2013-11-02 LAB — COMPREHENSIVE METABOLIC PANEL
ALT: 28 U/L (ref 0–53)
AST: 45 U/L — ABNORMAL HIGH (ref 0–37)
Albumin: 4.6 g/dL (ref 3.5–5.2)
Alkaline Phosphatase: 70 U/L (ref 39–117)
BILIRUBIN TOTAL: 3.2 mg/dL — AB (ref 0.2–1.2)
BUN: 11 mg/dL (ref 6–23)
CO2: 20 meq/L (ref 19–32)
Calcium: 9.7 mg/dL (ref 8.4–10.5)
Chloride: 108 mEq/L (ref 96–112)
Creatinine, Ser: 0.97 mg/dL (ref 0.50–1.35)
Glucose, Bld: 76 mg/dL (ref 70–99)
Potassium: 4.5 mEq/L (ref 3.5–5.3)
SODIUM: 137 meq/L (ref 135–145)
TOTAL PROTEIN: 7.8 g/dL (ref 6.0–8.3)

## 2013-11-02 LAB — RETICULOCYTES (CHCC)
ABS Retic: 458.3 10*3/uL — ABNORMAL HIGH (ref 19.0–186.0)
RBC.: 4.02 MIL/uL — ABNORMAL LOW (ref 4.22–5.81)
Retic Ct Pct: 11.4 % — ABNORMAL HIGH (ref 0.4–2.3)

## 2013-11-02 LAB — HEMOGLOBINOPATHY EVALUATION
HGB A2 QUANT: 3.2 % (ref 2.2–3.2)
HGB F QUANT: 11 % — AB (ref 0.0–2.0)
Hemoglobin Other: 0 %
Hgb A: 0 % — ABNORMAL LOW (ref 96.8–97.8)
Hgb S Quant: 85.8 % — ABNORMAL HIGH

## 2013-11-03 ENCOUNTER — Other Ambulatory Visit: Payer: Self-pay | Admitting: Nurse Practitioner

## 2013-11-05 ENCOUNTER — Other Ambulatory Visit: Payer: Self-pay | Admitting: *Deleted

## 2013-11-05 DIAGNOSIS — D5700 Hb-SS disease with crisis, unspecified: Secondary | ICD-10-CM

## 2013-11-05 MED ORDER — OXYCODONE HCL ER 80 MG PO T12A
80.0000 mg | EXTENDED_RELEASE_TABLET | Freq: Two times a day (BID) | ORAL | Status: DC
Start: 2013-11-05 — End: 2013-12-09

## 2013-11-05 MED ORDER — OXYCODONE HCL 30 MG PO TABS: 30.0000 mg | ORAL_TABLET | Freq: Four times a day (QID) | ORAL | Status: DC | PRN

## 2013-12-08 ENCOUNTER — Other Ambulatory Visit: Payer: Self-pay | Admitting: *Deleted

## 2013-12-09 ENCOUNTER — Other Ambulatory Visit: Payer: Self-pay | Admitting: *Deleted

## 2013-12-09 DIAGNOSIS — D5700 Hb-SS disease with crisis, unspecified: Secondary | ICD-10-CM

## 2013-12-09 MED ORDER — OXYCODONE HCL 30 MG PO TABS: 30.0000 mg | ORAL_TABLET | Freq: Four times a day (QID) | ORAL | Status: DC | PRN

## 2013-12-09 MED ORDER — OXYCODONE HCL ER 80 MG PO T12A: 80.0000 mg | EXTENDED_RELEASE_TABLET | Freq: Two times a day (BID) | ORAL | Status: DC

## 2013-12-16 ENCOUNTER — Other Ambulatory Visit: Payer: Self-pay | Admitting: Hematology & Oncology

## 2014-01-06 ENCOUNTER — Other Ambulatory Visit: Payer: Self-pay | Admitting: *Deleted

## 2014-01-06 DIAGNOSIS — D5700 Hb-SS disease with crisis, unspecified: Secondary | ICD-10-CM

## 2014-01-06 MED ORDER — OXYCODONE HCL ER 80 MG PO T12A: 80.0000 mg | EXTENDED_RELEASE_TABLET | Freq: Two times a day (BID) | ORAL | Status: DC

## 2014-01-06 MED ORDER — OXYCODONE HCL 30 MG PO TABS: 30.0000 mg | ORAL_TABLET | Freq: Four times a day (QID) | ORAL | Status: DC | PRN

## 2014-01-21 ENCOUNTER — Encounter: Payer: Self-pay | Admitting: Hematology & Oncology

## 2014-01-21 ENCOUNTER — Other Ambulatory Visit (HOSPITAL_BASED_OUTPATIENT_CLINIC_OR_DEPARTMENT_OTHER): Payer: Medicare Other | Admitting: Lab

## 2014-01-21 ENCOUNTER — Ambulatory Visit (HOSPITAL_BASED_OUTPATIENT_CLINIC_OR_DEPARTMENT_OTHER): Payer: Medicare Other | Admitting: Hematology & Oncology

## 2014-01-21 ENCOUNTER — Ambulatory Visit (HOSPITAL_BASED_OUTPATIENT_CLINIC_OR_DEPARTMENT_OTHER): Payer: Medicare Other

## 2014-01-21 VITALS — BP 116/67 | HR 79 | Temp 97.8°F | Resp 18 | Ht 66.0 in | Wt 163.0 lb

## 2014-01-21 DIAGNOSIS — D72829 Elevated white blood cell count, unspecified: Secondary | ICD-10-CM

## 2014-01-21 DIAGNOSIS — Z23 Encounter for immunization: Secondary | ICD-10-CM

## 2014-01-21 DIAGNOSIS — D571 Sickle-cell disease without crisis: Secondary | ICD-10-CM

## 2014-01-21 DIAGNOSIS — D5700 Hb-SS disease with crisis, unspecified: Secondary | ICD-10-CM

## 2014-01-21 LAB — CBC WITH DIFFERENTIAL (CANCER CENTER ONLY)
BASO#: 0.1 10*3/uL (ref 0.0–0.2)
BASO%: 1.1 % (ref 0.0–2.0)
EOS%: 1.6 % (ref 0.0–7.0)
Eosinophils Absolute: 0.1 10*3/uL (ref 0.0–0.5)
HEMATOCRIT: 29.7 % — AB (ref 38.7–49.9)
HGB: 10.8 g/dL — ABNORMAL LOW (ref 13.0–17.1)
LYMPH#: 2.4 10*3/uL (ref 0.9–3.3)
LYMPH%: 28.5 % (ref 14.0–48.0)
MCH: 29.7 pg (ref 28.0–33.4)
MCHC: 36.4 g/dL — AB (ref 32.0–35.9)
MCV: 82 fL (ref 82–98)
MONO#: 0.9 10*3/uL (ref 0.1–0.9)
MONO%: 10.8 % (ref 0.0–13.0)
NEUT#: 4.8 10*3/uL (ref 1.5–6.5)
NEUT%: 58 % (ref 40.0–80.0)
Platelets: 294 10*3/uL (ref 145–400)
RBC: 3.64 10*6/uL — ABNORMAL LOW (ref 4.20–5.70)
RDW: 23.9 % — ABNORMAL HIGH (ref 11.1–15.7)
WBC: 8.3 10*3/uL (ref 4.0–10.0)

## 2014-01-21 LAB — IRON AND TIBC CHCC
%SAT: 52 % (ref 20–55)
IRON: 183 ug/dL — AB (ref 42–163)
TIBC: 353 ug/dL (ref 202–409)
UIBC: 169 ug/dL (ref 117–376)

## 2014-01-21 LAB — TECHNOLOGIST REVIEW CHCC SATELLITE

## 2014-01-21 LAB — FERRITIN CHCC: FERRITIN: 79 ng/mL (ref 22–316)

## 2014-01-21 MED ORDER — OXYCODONE HCL ER 80 MG PO T12A: 80.0000 mg | EXTENDED_RELEASE_TABLET | Freq: Two times a day (BID) | ORAL | Status: DC

## 2014-01-21 MED ORDER — ALPRAZOLAM 1 MG PO TABS: 1.0000 mg | ORAL_TABLET | Freq: Three times a day (TID) | ORAL | Status: DC | PRN

## 2014-01-21 MED ORDER — INFLUENZA VAC SPLIT QUAD 0.5 ML IM SUSY
0.5000 mL | PREFILLED_SYRINGE | INTRAMUSCULAR | Status: AC
  Administered 2014-01-21: 0.5 mL via INTRAMUSCULAR
  Filled 2014-01-21: qty 0.5

## 2014-01-21 MED ORDER — OXYCODONE HCL 30 MG PO TABS: 30.0000 mg | ORAL_TABLET | Freq: Four times a day (QID) | ORAL | Status: DC | PRN

## 2014-01-22 NOTE — Progress Notes (Signed)
Hematology and Oncology Follow Up Visit  [name] 07/07/1980 33 y.o. 01/22/2014   Principle Diagnosis:   Hemoglobin SS disease  Current Therapy:    Folic acid 1 mg by mouth daily  Hydrea 500 mg by mouth twice a day     Interim History:  [name] is back for followup. He's been doing well since last saw him in July. He has had no sickle crises. He had a pretty good summer. He now lives in [place]. He is with his girlfriend, who hopefully will be his fiance.  He's had no cough or shortness of breath. He did have some bronchitis. This has resolved.  He's had no fever. There's been no abdominal pain.  His pain medications have helped quite a bit. He has been functional.  There's been no issues with the Hydrea. We checked his hemoglobin electrophoresis back in July. He is at 11% hemoglobin F. His hemoglobin S was 86%  Medications: Current outpatient prescriptions:ALPRAZolam (XANAX) 1 MG tablet, Take 1 tablet (1 mg total) by mouth 3 (three) times daily as needed for anxiety., Disp: 90 tablet, Rfl: 0;  Cyanocobalamin (VITAMIN B 12 PO), Take 1 tablet by mouth., Disp: , Rfl: ;  hydroxyurea (HYDREA) 500 MG capsule, Take 2 capsules (1,000 mg total) by mouth daily. May take with food to minimize GI side effects., Disp: 60 capsule, Rfl: 2 Lactulose SOLN, Take by mouth as needed. 1 tsp. As needed, Disp: , Rfl: ;  Multiple Vitamin (MULITIVITAMIN WITH MINERALS) TABS, Take 1 tablet by mouth daily., Disp: , Rfl: ;  OxyCODONE (OXYCONTIN) 80 mg T12A 12 hr tablet, Take 1 tablet (80 mg total) by mouth every 12 (twelve) hours., Disp: 60 tablet, Rfl: 0 oxycodone (ROXICODONE) 30 MG immediate release tablet, Take 1 tablet (30 mg total) by mouth every 6 (six) hours as needed for pain., Disp: 120 tablet, Rfl: 0  Allergies:  Allergies  Allergen Reactions  . Morphine And Related Rash  . Promethazine Hcl     Has hallucination when this drug mixed with other drugs  . Morphine Hives and  Itching    Past Medical History, Surgical history, Social history, and Family History were reviewed and updated.  Review of Systems: As above  Physical Exam:  height is 5\' 6"  (1.676 m) and weight is 163 lb (73.936 kg). His oral temperature is 97.8 F (36.6 C). His blood pressure is 116/67 and his pulse is 79. His respiration is 18.   Well-developed and well-nourished African American gentleman. Head and neck exam shows no ocular or oral lesions. There is no scleral icterus. Lungs are clear. Cardiac exam regular in rhythm with no murmurs rubs or bruits. Abdomen soft. Has good bowel sounds. There is no fluid wave. There is no palpable liver or spleen tip. Back exam shows no tenderness over the spine. Extremities shows no clubbing cyanosis or edema. Skin exam no rashes or ulcerations. Neurological exam is nonfocal.  Lab Results  Component Value Date   WBC 8.3 01/21/2014   HGB 10.8* 01/21/2014   HCT 29.7* 01/21/2014   MCV 82 01/21/2014   PLT 294 01/21/2014     Chemistry      Component Value Date/Time   NA 137 01/21/2014 1114   NA 139 05/07/2013 1030   K 4.1 01/21/2014 1114   K 4.0 05/07/2013 1030   CL 107 01/21/2014 1114   CL 105 05/07/2013 1030   CO2 22 01/21/2014 1114   CO2 27 05/07/2013 1030  BUN 11 01/21/2014 1114   BUN 7 05/07/2013 1030   CREATININE 0.88 01/21/2014 1114   CREATININE 0.9 05/07/2013 1030      Component Value Date/Time   CALCIUM 9.9 01/21/2014 1114   CALCIUM 9.2 05/07/2013 1030   ALKPHOS 77 01/21/2014 1114   ALKPHOS 97* 05/07/2013 1030   AST 48* 01/21/2014 1114   AST 50* 05/07/2013 1030   ALT 26 01/21/2014 1114   ALT 26 05/07/2013 1030   BILITOT 3.2* 01/21/2014 1114   BILITOT 3.10* 05/07/2013 1030      Ferritin is 79. Iron saturation is 52%. Total iron is 183.  Impression and Plan: [name] is 33 year old gentleman with hemoglobin SS disease. He is doing well. So far, there's been no problems with respect to crises. The high hemoglobin F level should be  helping.  We do not need to phlebotomize or do an exchange transfusion.  I'll plan to get him back in 2 months time now. I did give him prescriptions for his medications. Josph MachoENNEVER,PETER R, MD 10/16/20156:29 PM

## 2014-01-25 LAB — COMPREHENSIVE METABOLIC PANEL
ALT: 26 U/L (ref 0–53)
AST: 48 U/L — ABNORMAL HIGH (ref 0–37)
Albumin: 4.5 g/dL (ref 3.5–5.2)
Alkaline Phosphatase: 77 U/L (ref 39–117)
BUN: 11 mg/dL (ref 6–23)
CALCIUM: 9.9 mg/dL (ref 8.4–10.5)
CHLORIDE: 107 meq/L (ref 96–112)
CO2: 22 meq/L (ref 19–32)
CREATININE: 0.88 mg/dL (ref 0.50–1.35)
Glucose, Bld: 86 mg/dL (ref 70–99)
Potassium: 4.1 mEq/L (ref 3.5–5.3)
Sodium: 137 mEq/L (ref 135–145)
Total Bilirubin: 3.2 mg/dL — ABNORMAL HIGH (ref 0.2–1.2)
Total Protein: 7.5 g/dL (ref 6.0–8.3)

## 2014-01-25 LAB — HEMOGLOBINOPATHY EVALUATION
HGB A2 QUANT: 3.2 % (ref 2.2–3.2)
Hemoglobin Other: 0 %
Hgb A: 0 % — ABNORMAL LOW (ref 96.8–97.8)
Hgb F Quant: 12.1 % — ABNORMAL HIGH (ref 0.0–2.0)
Hgb S Quant: 84.7 % — ABNORMAL HIGH

## 2014-01-26 DIAGNOSIS — G8929 Other chronic pain: Secondary | ICD-10-CM | POA: Insufficient documentation

## 2014-02-02 ENCOUNTER — Telehealth: Payer: Self-pay | Admitting: Hematology & Oncology

## 2014-02-02 NOTE — Telephone Encounter (Signed)
[name] from [facility] called schedule 11-2 appointment and will fax records

## 2014-02-08 ENCOUNTER — Ambulatory Visit: Payer: Medicare Other | Admitting: Family

## 2014-02-08 ENCOUNTER — Other Ambulatory Visit: Payer: Medicare Other | Admitting: Lab

## 2014-03-09 ENCOUNTER — Other Ambulatory Visit: Payer: Self-pay | Admitting: *Deleted

## 2014-03-09 DIAGNOSIS — D5700 Hb-SS disease with crisis, unspecified: Secondary | ICD-10-CM

## 2014-03-09 MED ORDER — OXYCODONE HCL ER 80 MG PO T12A: 80.0000 mg | EXTENDED_RELEASE_TABLET | Freq: Two times a day (BID) | ORAL | Status: DC

## 2014-03-09 MED ORDER — ALPRAZOLAM 1 MG PO TABS: 1.0000 mg | ORAL_TABLET | Freq: Three times a day (TID) | ORAL | Status: DC | PRN

## 2014-03-09 MED ORDER — OXYCODONE HCL 30 MG PO TABS: 30.0000 mg | ORAL_TABLET | Freq: Four times a day (QID) | ORAL | Status: DC | PRN

## 2014-03-10 ENCOUNTER — Other Ambulatory Visit: Payer: Self-pay | Admitting: *Deleted

## 2014-03-10 DIAGNOSIS — D5700 Hb-SS disease with crisis, unspecified: Secondary | ICD-10-CM

## 2014-03-10 MED ORDER — ALPRAZOLAM 1 MG PO TABS: 1.0000 mg | ORAL_TABLET | Freq: Three times a day (TID) | ORAL | Status: DC | PRN

## 2014-03-10 MED ORDER — OXYCODONE HCL ER 80 MG PO T12A: 80.0000 mg | EXTENDED_RELEASE_TABLET | Freq: Two times a day (BID) | ORAL | Status: DC

## 2014-03-10 MED ORDER — OXYCODONE HCL 30 MG PO TABS: 30.0000 mg | ORAL_TABLET | Freq: Four times a day (QID) | ORAL | Status: DC | PRN

## 2014-03-24 ENCOUNTER — Encounter: Payer: Self-pay | Admitting: Hematology & Oncology

## 2014-03-24 ENCOUNTER — Ambulatory Visit (HOSPITAL_BASED_OUTPATIENT_CLINIC_OR_DEPARTMENT_OTHER): Payer: Medicare Other

## 2014-03-24 ENCOUNTER — Other Ambulatory Visit (HOSPITAL_BASED_OUTPATIENT_CLINIC_OR_DEPARTMENT_OTHER): Payer: Medicare Other | Admitting: Lab

## 2014-03-24 ENCOUNTER — Ambulatory Visit (HOSPITAL_BASED_OUTPATIENT_CLINIC_OR_DEPARTMENT_OTHER): Payer: Medicare Other | Admitting: Hematology & Oncology

## 2014-03-24 VITALS — BP 116/70 | HR 84 | Temp 98.5°F | Resp 18 | Ht 66.0 in | Wt 162.0 lb

## 2014-03-24 VITALS — BP 124/68 | HR 71 | Temp 97.9°F | Resp 18

## 2014-03-24 DIAGNOSIS — D571 Sickle-cell disease without crisis: Secondary | ICD-10-CM

## 2014-03-24 DIAGNOSIS — D5700 Hb-SS disease with crisis, unspecified: Secondary | ICD-10-CM

## 2014-03-24 LAB — CBC WITH DIFFERENTIAL (CANCER CENTER ONLY)
BASO#: 0 10*3/uL (ref 0.0–0.2)
BASO%: 0.5 % (ref 0.0–2.0)
EOS ABS: 0.1 10*3/uL (ref 0.0–0.5)
EOS%: 1.3 % (ref 0.0–7.0)
HEMATOCRIT: 34.2 % — AB (ref 38.7–49.9)
HGB: 12.2 g/dL — ABNORMAL LOW (ref 13.0–17.1)
LYMPH#: 1.9 10*3/uL (ref 0.9–3.3)
LYMPH%: 22.7 % (ref 14.0–48.0)
MCH: 30 pg (ref 28.0–33.4)
MCHC: 35.7 g/dL (ref 32.0–35.9)
MCV: 84 fL (ref 82–98)
MONO#: 0.8 10*3/uL (ref 0.1–0.9)
MONO%: 10.2 % (ref 0.0–13.0)
NEUT#: 5.4 10*3/uL (ref 1.5–6.5)
NEUT%: 65.3 % (ref 40.0–80.0)
Platelets: 330 10*3/uL (ref 145–400)
RBC: 4.06 10*6/uL — AB (ref 4.20–5.70)
RDW: 19.3 % — ABNORMAL HIGH (ref 11.1–15.7)
WBC: 8.3 10*3/uL (ref 4.0–10.0)

## 2014-03-24 LAB — CMP (CANCER CENTER ONLY)
ALBUMIN: 4.1 g/dL (ref 3.3–5.5)
ALK PHOS: 95 U/L — AB (ref 26–84)
ALT(SGPT): 36 U/L (ref 10–47)
AST: 42 U/L — AB (ref 11–38)
BUN: 12 mg/dL (ref 7–22)
CHLORIDE: 107 meq/L (ref 98–108)
CO2: 24 mEq/L (ref 18–33)
Calcium: 9.4 mg/dL (ref 8.0–10.3)
Creat: 1 mg/dl (ref 0.6–1.2)
Glucose, Bld: 100 mg/dL (ref 73–118)
POTASSIUM: 3.9 meq/L (ref 3.3–4.7)
SODIUM: 138 meq/L (ref 128–145)
TOTAL PROTEIN: 8.1 g/dL (ref 6.4–8.1)
Total Bilirubin: 2 mg/dl — ABNORMAL HIGH (ref 0.20–1.60)

## 2014-03-24 LAB — IRON AND TIBC CHCC
%SAT: 18 % — AB (ref 20–55)
IRON: 64 ug/dL (ref 42–163)
TIBC: 360 ug/dL (ref 202–409)
UIBC: 296 ug/dL (ref 117–376)

## 2014-03-24 LAB — FERRITIN CHCC: Ferritin: 48 ng/ml (ref 22–316)

## 2014-03-24 LAB — CHCC SATELLITE - SMEAR

## 2014-03-24 MED ORDER — OXYCODONE HCL ER 80 MG PO T12A: 80.0000 mg | EXTENDED_RELEASE_TABLET | Freq: Two times a day (BID) | ORAL | Status: DC

## 2014-03-24 MED ORDER — ALPRAZOLAM 1 MG PO TABS: 1.0000 mg | ORAL_TABLET | Freq: Three times a day (TID) | ORAL | Status: DC | PRN

## 2014-03-24 MED ORDER — OXYCODONE HCL 30 MG PO TABS: 30.0000 mg | ORAL_TABLET | Freq: Four times a day (QID) | ORAL | Status: DC | PRN

## 2014-03-24 NOTE — Progress Notes (Signed)
Hematology and Oncology Follow Up Visit  Kerrie PleasureYaw N Tibbits [id number] 1980-04-23 33 y.o. 03/24/2014   Principle Diagnosis:   Hemoglobin SS disease  Current Therapy:    Folic acid 1 mg by mouth daily  Hydrea 500 mg by mouth twice a day  Phlebotomy to maintain his hemoglobin below 11.     Interim History:  Mr.  [name] is back for followup. He's been doing well since last saw him in October. He had a sickle crises in late October. He was hospitalized for about 8 -9 days.  Since then, he's been doing well. He's had no problems with rashes. He's had no problems with bowels or bladder. He's had no leg swelling. He's had no joint issues. He's had no fever. There's been no abdominal pain.  His pain medications have helped quite a bit. He has been functional.  There's been no issues with the Hydrea. We checked his hemoglobin electrophoresis back in October. He is at 12 % hemoglobin F. His hemoglobin S was 85%  Medications: Current outpatient prescriptions: [START ON 04/07/2014] ALPRAZolam (XANAX) 1 MG tablet, Take 1 tablet (1 mg total) by mouth 3 (three) times daily as needed for anxiety., Disp: 90 tablet, Rfl: 0;  Cyanocobalamin (VITAMIN B 12 PO), Take 1 tablet by mouth., Disp: , Rfl: ;  hydroxyurea (HYDREA) 500 MG capsule, Take 2 capsules (1,000 mg total) by mouth daily. May take with food to minimize GI side effects., Disp: 60 capsule, Rfl: 2 Lactulose SOLN, Take by mouth as needed. 1 tsp. As needed, Disp: , Rfl: ;  Multiple Vitamin (MULITIVITAMIN WITH MINERALS) TABS, Take 1 tablet by mouth daily., Disp: , Rfl: ;  [START ON 04/07/2014] OxyCODONE (OXYCONTIN) 80 mg T12A 12 hr tablet, Take 1 tablet (80 mg total) by mouth every 12 (twelve) hours. Do not fill until 12/30, Disp: 60 tablet, Rfl: 0 oxycodone (ROXICODONE) 30 MG immediate release tablet, Take 1 tablet (30 mg total) by mouth every 6 (six) hours as needed for pain. Do not fill until 12/30, Disp: 120 tablet, Rfl: 0  Allergies:  Allergies   Allergen Reactions  . Morphine And Related Rash  . Promethazine Hcl     Has hallucination when this drug mixed with other drugs  . Morphine Hives and Itching    Past Medical History, Surgical history, Social history, and Family History were reviewed and updated.  Review of Systems: As above  Physical Exam:  height is 5\' 6"  (1.676 m) and weight is 162 lb (73.483 kg). His oral temperature is 98.5 F (36.9 C). His blood pressure is 116/70 and his pulse is 84. His respiration is 18.   Well-developed and well-nourished African American gentleman. Head and neck exam shows no ocular or oral lesions. There is no scleral icterus. Lungs are clear. Cardiac exam regular rate and 1 mL rhythm with no murmurs rubs or bruits. Abdomen soft. Has good bowel sounds. There is no fluid wave. There is no palpable liver or spleen tip. Back exam shows no tenderness over the spine. Extremities shows no clubbing cyanosis or edema. Skin exam no rashes or ulcerations. Neurological exam is nonfocal.  Lab Results  Component Value Date   WBC 8.3 03/24/2014   HGB 12.2* 03/24/2014   HCT 34.2* 03/24/2014   MCV 84 03/24/2014   PLT 330 03/24/2014     Chemistry      Component Value Date/Time   NA 138 03/24/2014 1031   NA 137 01/21/2014 1114   K 3.9 03/24/2014 1031  K 4.1 01/21/2014 1114   CL 107 03/24/2014 1031   CL 107 01/21/2014 1114   CO2 24 03/24/2014 1031   CO2 22 01/21/2014 1114   BUN 12 03/24/2014 1031   BUN 11 01/21/2014 1114   CREATININE 1.0 03/24/2014 1031   CREATININE 0.88 01/21/2014 1114      Component Value Date/Time   CALCIUM 9.4 03/24/2014 1031   CALCIUM 9.9 01/21/2014 1114   ALKPHOS 95* 03/24/2014 1031   ALKPHOS 77 01/21/2014 1114   AST 42* 03/24/2014 1031   AST 48* 01/21/2014 1114   ALT 36 03/24/2014 1031   ALT 26 01/21/2014 1114   BILITOT 2.00* 03/24/2014 1031   BILITOT 3.2* 01/21/2014 1114        Impression and Plan: [name] is 33 year old gentleman with hemoglobin SS  disease. He is doing well. So far, recently, there's been no problems with respect to crises. The high hemoglobin F level should be helping.  His hemoglobin is a little bit too high today. We will go ahead and phlebotomize him.  I'll plan to get him back in 2 months time now. I did give him prescriptions for his medications. Josph MachoENNEVER,Breyonna Nault R, MD 12/16/201512:04 PM

## 2014-03-24 NOTE — Patient Instructions (Signed)

## 2014-03-24 NOTE — Progress Notes (Signed)
[name] presents today for phlebotomy per MD orders. Phlebotomy procedure started at 1200 and ended at 1210 250 grams removed. Pt clotted after half of bag. Pt requested not to continue. Dr. Myna HidalgoEnnever made aware.  Patient observed for 20 minutes after procedure without any incident. He refused to wait the complete 30 min. VSS.  Patient tolerated procedure well. IV needle removed intact.

## 2014-03-26 LAB — RETICULOCYTES (CHCC)
ABS RETIC: 335 10*3/uL — AB (ref 19.0–186.0)
RBC.: 4.24 MIL/uL (ref 4.22–5.81)
Retic Ct Pct: 7.9 % — ABNORMAL HIGH (ref 0.4–2.3)

## 2014-03-26 LAB — HEMOGLOBINOPATHY EVALUATION
HEMOGLOBIN OTHER: 0 %
HGB A2 QUANT: 3 % (ref 2.2–3.2)
HGB A: 24.6 % — AB (ref 96.8–97.8)
HGB S QUANTITAION: 63.6 % — AB
Hgb F Quant: 8.8 % — ABNORMAL HIGH (ref 0.0–2.0)

## 2014-05-12 ENCOUNTER — Other Ambulatory Visit: Payer: Self-pay | Admitting: Nurse Practitioner

## 2014-05-12 DIAGNOSIS — D5700 Hb-SS disease with crisis, unspecified: Secondary | ICD-10-CM

## 2014-05-12 MED ORDER — ALPRAZOLAM 1 MG PO TABS: 1.0000 mg | ORAL_TABLET | Freq: Three times a day (TID) | ORAL | Status: DC | PRN

## 2014-05-12 MED ORDER — OXYCODONE HCL 30 MG PO TABS: 30.0000 mg | ORAL_TABLET | Freq: Four times a day (QID) | ORAL | Status: DC | PRN

## 2014-05-12 MED ORDER — OXYCODONE HCL ER 80 MG PO T12A: 80.0000 mg | EXTENDED_RELEASE_TABLET | Freq: Two times a day (BID) | ORAL | Status: DC

## 2014-05-26 ENCOUNTER — Ambulatory Visit (HOSPITAL_BASED_OUTPATIENT_CLINIC_OR_DEPARTMENT_OTHER): Payer: PRIVATE HEALTH INSURANCE | Admitting: Hematology & Oncology

## 2014-05-26 ENCOUNTER — Other Ambulatory Visit (HOSPITAL_BASED_OUTPATIENT_CLINIC_OR_DEPARTMENT_OTHER): Payer: PRIVATE HEALTH INSURANCE | Admitting: Lab

## 2014-05-26 ENCOUNTER — Encounter: Payer: Self-pay | Admitting: Hematology & Oncology

## 2014-05-26 VITALS — BP 114/62 | HR 77 | Temp 98.2°F | Resp 18 | Ht 66.0 in | Wt 167.0 lb

## 2014-05-26 DIAGNOSIS — D5700 Hb-SS disease with crisis, unspecified: Secondary | ICD-10-CM

## 2014-05-26 LAB — CBC WITH DIFFERENTIAL (CANCER CENTER ONLY)
BASO#: 0.1 10*3/uL (ref 0.0–0.2)
BASO%: 0.9 % (ref 0.0–2.0)
EOS%: 1.1 % (ref 0.0–7.0)
Eosinophils Absolute: 0.1 10*3/uL (ref 0.0–0.5)
HCT: 32.3 % — ABNORMAL LOW (ref 38.7–49.9)
HGB: 11.7 g/dL — ABNORMAL LOW (ref 13.0–17.1)
LYMPH#: 2.8 10*3/uL (ref 0.9–3.3)
LYMPH%: 34.2 % (ref 14.0–48.0)
MCH: 29 pg (ref 28.0–33.4)
MCHC: 36.2 g/dL — AB (ref 32.0–35.9)
MCV: 80 fL — ABNORMAL LOW (ref 82–98)
MONO#: 0.9 10*3/uL (ref 0.1–0.9)
MONO%: 11.4 % (ref 0.0–13.0)
NEUT#: 4.3 10*3/uL (ref 1.5–6.5)
NEUT%: 52.4 % (ref 40.0–80.0)
PLATELETS: 362 10*3/uL (ref 145–400)
RBC: 4.04 10*6/uL — ABNORMAL LOW (ref 4.20–5.70)
RDW: 23.7 % — ABNORMAL HIGH (ref 11.1–15.7)
WBC: 8.2 10*3/uL (ref 4.0–10.0)

## 2014-05-26 LAB — IRON AND TIBC CHCC
%SAT: 18 % — AB (ref 20–55)
Iron: 71 ug/dL (ref 42–163)
TIBC: 388 ug/dL (ref 202–409)
UIBC: 317 ug/dL (ref 117–376)

## 2014-05-26 LAB — CMP (CANCER CENTER ONLY)
ALT(SGPT): 40 U/L (ref 10–47)
AST: 54 U/L — AB (ref 11–38)
Albumin: 4.1 g/dL (ref 3.3–5.5)
Alkaline Phosphatase: 105 U/L — ABNORMAL HIGH (ref 26–84)
BUN, Bld: 9 mg/dL (ref 7–22)
CHLORIDE: 105 meq/L (ref 98–108)
CO2: 24 mEq/L (ref 18–33)
CREATININE: 0.9 mg/dL (ref 0.6–1.2)
Calcium: 8.8 mg/dL (ref 8.0–10.3)
GLUCOSE: 86 mg/dL (ref 73–118)
POTASSIUM: 4.4 meq/L (ref 3.3–4.7)
Sodium: 140 mEq/L (ref 128–145)
Total Bilirubin: 2.8 mg/dl — ABNORMAL HIGH (ref 0.20–1.60)
Total Protein: 7.7 g/dL (ref 6.4–8.1)

## 2014-05-26 LAB — CHCC SATELLITE - SMEAR

## 2014-05-26 LAB — TECHNOLOGIST REVIEW CHCC SATELLITE

## 2014-05-26 LAB — FERRITIN CHCC: FERRITIN: 66 ng/mL (ref 22–316)

## 2014-05-26 MED ORDER — OXYCODONE HCL ER 80 MG PO T12A: 80.0000 mg | EXTENDED_RELEASE_TABLET | Freq: Two times a day (BID) | ORAL | Status: DC

## 2014-05-26 MED ORDER — ALPRAZOLAM 1 MG PO TABS: 1.0000 mg | ORAL_TABLET | Freq: Three times a day (TID) | ORAL | Status: DC | PRN

## 2014-05-26 MED ORDER — OXYCODONE HCL 30 MG PO TABS: 30.0000 mg | ORAL_TABLET | Freq: Four times a day (QID) | ORAL | Status: DC | PRN

## 2014-05-26 NOTE — Progress Notes (Signed)
Hematology and Oncology Follow Up Visit  [name] [id number] May 07, 1980 34 y.o. 05/26/2014   Principle Diagnosis:   Hemoglobin SS disease  Current Therapy:    Folic acid 1 mg by mouth daily  Hydrea 500 mg by mouth twice a day  Phlebotomy to maintain his hemoglobin below 11.     Interim History:  [name] is back for followup. He's been doing well since last saw him in December He had a sickle crises in late October. He was hospitalized for about 8 -9 days.  We did go ahead and phlebotomize him voice saw him in December. I really want to try to keep his hemoglobin below 11.  After we had seen him,, he's been doing well. He's had no problems with rashes. He's had no problems with bowels or bladder. He's had no leg swelling. He's had no joint issues. He's had no fever. There's been no abdominal pain.  His pain medications have helped quite a bit. He has been functional.  There's been no issues with the Hydrea. We checked his hemoglobin electrophoresis back in October. He is at 8 % hemoglobin F. His hemoglobin S was 64%. Hemoglobin A was 25%.  Medications:  Current outpatient prescriptions:  .  ALPRAZolam (XANAX) 1 MG tablet, Take 1 tablet (1 mg total) by mouth 3 (three) times daily as needed for anxiety., Disp: 90 tablet, Rfl: 0 .  Cyanocobalamin (VITAMIN B 12 PO), Take 1 tablet by mouth., Disp: , Rfl:  .  hydroxyurea (HYDREA) 500 MG capsule, Take 2 capsules (1,000 mg total) by mouth daily. May take with food to minimize GI side effects., Disp: 60 capsule, Rfl: 2 .  Lactulose SOLN, Take by mouth as needed. 1 tsp. As needed, Disp: , Rfl:  .  Multiple Vitamin (MULITIVITAMIN WITH MINERALS) TABS, Take 1 tablet by mouth daily., Disp: , Rfl:  .  OxyCODONE (OXYCONTIN) 80 mg T12A 12 hr tablet, Take 1 tablet (80 mg total) by mouth every 12 (twelve) hours. Do not fill until 12/30, Disp: 60 tablet, Rfl: 0 .  oxycodone (ROXICODONE) 30 MG immediate release tablet, Take 1 tablet (30 mg  total) by mouth every 6 (six) hours as needed for pain. Do not fill until 12/30, Disp: 120 tablet, Rfl: 0  Allergies:  Allergies  Allergen Reactions  . Morphine And Related Rash  . Promethazine Hcl     Has hallucination when this drug mixed with other drugs  . Morphine Hives and Itching    Past Medical History, Surgical history, Social history, and Family History were reviewed and updated.  Review of Systems: As above  Physical Exam:  height is 5\' 6"  (1.676 m) and weight is 167 lb (75.751 kg). His oral temperature is 98.2 F (36.8 C). His blood pressure is 114/62 and his pulse is 77. His respiration is 18.   Well-developed and well-nourished African American gentleman. Head and neck exam shows no ocular or oral lesions. There is no scleral icterus. Lungs are clear. Cardiac exam regular rate and rhythm with no murmurs rubs or bruits. Abdomen soft. Has good bowel sounds. There is no fluid wave. There is no palpable liver or spleen tip. Back exam shows no tenderness over the spine. Extremities shows no clubbing cyanosis or edema. Skin exam no rashes or ulcerations. Neurological exam is nonfocal.  Lab Results  Component Value Date   WBC 8.2 05/26/2014   HGB 11.7* 05/26/2014   HCT 32.3* 05/26/2014   MCV 80* 05/26/2014   PLT  362 05/26/2014     Chemistry      Component Value Date/Time   NA 140 05/26/2014 1114   NA 137 01/21/2014 1114   K 4.4 05/26/2014 1114   K 4.1 01/21/2014 1114   CL 105 05/26/2014 1114   CL 107 01/21/2014 1114   CO2 24 05/26/2014 1114   CO2 22 01/21/2014 1114   BUN 9 05/26/2014 1114   BUN 11 01/21/2014 1114   CREATININE 0.9 05/26/2014 1114   CREATININE 0.88 01/21/2014 1114      Component Value Date/Time   CALCIUM 8.8 05/26/2014 1114   CALCIUM 9.9 01/21/2014 1114   ALKPHOS 105* 05/26/2014 1114   ALKPHOS 77 01/21/2014 1114   AST 54* 05/26/2014 1114   AST 48* 01/21/2014 1114   ALT 40 05/26/2014 1114   ALT 26 01/21/2014 1114   BILITOT 2.80* 05/26/2014  1114   BILITOT 3.2* 01/21/2014 1114        Impression and Plan: [name] is 34 year old gentleman with hemoglobin SS disease. He is doing well. So far, recently, there's been no problems with respect to crises. The high hemoglobin F level should be helping.  His hemoglobin is a little bit too high today. I talked to him about phlebotomizing him. He does not want to be phlebotomize. He says that he really feels well. As such, we will hold off on phlebotomizing him.  I'll plan to get him back in 2 months time now. I did give him prescriptions for his medications.    Josph Macho, MD 2/17/20161:57 PM

## 2014-05-27 ENCOUNTER — Telehealth: Payer: Self-pay | Admitting: Hematology & Oncology

## 2014-05-27 NOTE — Telephone Encounter (Signed)
Faxed SICKLE CELL AUTHORIZATION REQUEST form to:  [facility] [address] [place] [address] F: [phone] P: [phone]   Case: [id number]    COPY SCANNED

## 2014-05-28 LAB — HEMOGLOBINOPATHY EVALUATION
Hemoglobin Other: 0 %
Hgb A2 Quant: 3.3 % — ABNORMAL HIGH (ref 2.2–3.2)
Hgb A: 7.3 % — ABNORMAL LOW (ref 96.8–97.8)
Hgb F Quant: 10 % — ABNORMAL HIGH (ref 0.0–2.0)
Hgb S Quant: 79.4 % — ABNORMAL HIGH

## 2014-05-28 LAB — RETICULOCYTES (CHCC)
ABS Retic: 464 10*3/uL — ABNORMAL HIGH (ref 19.0–186.0)
RBC.: 4.07 MIL/uL — AB (ref 4.22–5.81)
Retic Ct Pct: 11.4 % — ABNORMAL HIGH (ref 0.4–2.3)

## 2014-06-23 DIAGNOSIS — R112 Nausea with vomiting, unspecified: Secondary | ICD-10-CM | POA: Diagnosis not present

## 2014-06-23 DIAGNOSIS — K529 Noninfective gastroenteritis and colitis, unspecified: Secondary | ICD-10-CM | POA: Diagnosis not present

## 2014-06-23 DIAGNOSIS — N179 Acute kidney failure, unspecified: Secondary | ICD-10-CM | POA: Diagnosis not present

## 2014-06-23 DIAGNOSIS — R197 Diarrhea, unspecified: Secondary | ICD-10-CM | POA: Diagnosis not present

## 2014-06-23 DIAGNOSIS — E860 Dehydration: Secondary | ICD-10-CM | POA: Diagnosis not present

## 2014-06-28 DIAGNOSIS — J9601 Acute respiratory failure with hypoxia: Secondary | ICD-10-CM | POA: Diagnosis not present

## 2014-06-28 DIAGNOSIS — M87312 Other secondary osteonecrosis, left shoulder: Secondary | ICD-10-CM | POA: Diagnosis not present

## 2014-06-28 DIAGNOSIS — R0600 Dyspnea, unspecified: Secondary | ICD-10-CM | POA: Diagnosis not present

## 2014-06-28 DIAGNOSIS — F419 Anxiety disorder, unspecified: Secondary | ICD-10-CM | POA: Diagnosis present

## 2014-06-28 DIAGNOSIS — D5701 Hb-SS disease with acute chest syndrome: Secondary | ICD-10-CM | POA: Diagnosis not present

## 2014-06-28 DIAGNOSIS — N179 Acute kidney failure, unspecified: Secondary | ICD-10-CM | POA: Diagnosis not present

## 2014-06-28 DIAGNOSIS — D5700 Hb-SS disease with crisis, unspecified: Secondary | ICD-10-CM | POA: Diagnosis not present

## 2014-06-28 DIAGNOSIS — I517 Cardiomegaly: Secondary | ICD-10-CM | POA: Diagnosis not present

## 2014-06-28 DIAGNOSIS — M25512 Pain in left shoulder: Secondary | ICD-10-CM | POA: Diagnosis not present

## 2014-06-28 DIAGNOSIS — J9621 Acute and chronic respiratory failure with hypoxia: Secondary | ICD-10-CM | POA: Diagnosis not present

## 2014-06-28 DIAGNOSIS — R5081 Fever presenting with conditions classified elsewhere: Secondary | ICD-10-CM | POA: Diagnosis present

## 2014-06-28 DIAGNOSIS — M87012 Idiopathic aseptic necrosis of left shoulder: Secondary | ICD-10-CM | POA: Diagnosis present

## 2014-06-28 DIAGNOSIS — M87311 Other secondary osteonecrosis, right shoulder: Secondary | ICD-10-CM | POA: Diagnosis present

## 2014-06-28 DIAGNOSIS — R918 Other nonspecific abnormal finding of lung field: Secondary | ICD-10-CM | POA: Diagnosis not present

## 2014-06-28 DIAGNOSIS — E874 Mixed disorder of acid-base balance: Secondary | ICD-10-CM | POA: Diagnosis not present

## 2014-07-01 ENCOUNTER — Telehealth: Payer: Self-pay | Admitting: Hematology & Oncology

## 2014-07-01 NOTE — Telephone Encounter (Signed)
Dr. Emmit Alexandersamen from [facility] called said pt was there and needed follow up, she is aware pt has 3-31 appointment and said that would be fine, she will let him know to keep that appointment. I left our RN message to check care everywhere for notes and to call her at [phone] if she can't see notes from [facility].

## 2014-07-06 ENCOUNTER — Other Ambulatory Visit: Payer: Self-pay | Admitting: Nurse Practitioner

## 2014-07-06 DIAGNOSIS — D5700 Hb-SS disease with crisis, unspecified: Secondary | ICD-10-CM

## 2014-07-06 MED ORDER — OXYCODONE HCL ER 80 MG PO T12A: 80.0000 mg | EXTENDED_RELEASE_TABLET | Freq: Two times a day (BID) | ORAL | Status: DC

## 2014-07-06 MED ORDER — OXYCODONE HCL 30 MG PO TABS: 30.0000 mg | ORAL_TABLET | Freq: Four times a day (QID) | ORAL | Status: DC | PRN

## 2014-07-06 MED ORDER — ALPRAZOLAM 1 MG PO TABS: 1.0000 mg | ORAL_TABLET | Freq: Three times a day (TID) | ORAL | Status: DC | PRN

## 2014-07-07 ENCOUNTER — Ambulatory Visit: Payer: Medicare Other | Admitting: Hematology & Oncology

## 2014-07-07 ENCOUNTER — Other Ambulatory Visit: Payer: Medicare Other | Admitting: Lab

## 2014-07-08 ENCOUNTER — Ambulatory Visit: Payer: Medicare Other | Admitting: Hematology & Oncology

## 2014-07-08 ENCOUNTER — Other Ambulatory Visit: Payer: Medicare Other

## 2014-07-23 ENCOUNTER — Other Ambulatory Visit (HOSPITAL_BASED_OUTPATIENT_CLINIC_OR_DEPARTMENT_OTHER): Payer: PRIVATE HEALTH INSURANCE

## 2014-07-23 ENCOUNTER — Encounter: Payer: Self-pay | Admitting: Hematology & Oncology

## 2014-07-23 ENCOUNTER — Ambulatory Visit (HOSPITAL_BASED_OUTPATIENT_CLINIC_OR_DEPARTMENT_OTHER): Payer: PRIVATE HEALTH INSURANCE | Admitting: Hematology & Oncology

## 2014-07-23 ENCOUNTER — Telehealth: Payer: Self-pay | Admitting: Hematology & Oncology

## 2014-07-23 VITALS — BP 119/69 | HR 66 | Temp 98.3°F | Resp 18 | Ht 66.0 in | Wt 162.0 lb

## 2014-07-23 DIAGNOSIS — D5700 Hb-SS disease with crisis, unspecified: Secondary | ICD-10-CM

## 2014-07-23 DIAGNOSIS — D571 Sickle-cell disease without crisis: Secondary | ICD-10-CM

## 2014-07-23 DIAGNOSIS — D5720 Sickle-cell/Hb-C disease without crisis: Secondary | ICD-10-CM | POA: Diagnosis not present

## 2014-07-23 LAB — IRON AND TIBC
%SAT: 28 % (ref 20–55)
Iron: 110 ug/dL (ref 42–165)
TIBC: 396 ug/dL (ref 215–435)
UIBC: 286 ug/dL (ref 125–400)

## 2014-07-23 LAB — CMP (CANCER CENTER ONLY)
ALT: 24 U/L (ref 10–47)
AST: 31 U/L (ref 11–38)
Albumin: 3.7 g/dL (ref 3.3–5.5)
Alkaline Phosphatase: 138 U/L — ABNORMAL HIGH (ref 26–84)
BUN, Bld: 11 mg/dL (ref 7–22)
CHLORIDE: 106 meq/L (ref 98–108)
CO2: 26 mEq/L (ref 18–33)
CREATININE: 1 mg/dL (ref 0.6–1.2)
Calcium: 9.9 mg/dL (ref 8.0–10.3)
Glucose, Bld: 76 mg/dL (ref 73–118)
Potassium: 3.6 mEq/L (ref 3.3–4.7)
Sodium: 139 mEq/L (ref 128–145)
TOTAL PROTEIN: 8.2 g/dL — AB (ref 6.4–8.1)
Total Bilirubin: 1.3 mg/dl (ref 0.20–1.60)

## 2014-07-23 LAB — FERRITIN: Ferritin: 27 ng/mL (ref 22–322)

## 2014-07-23 LAB — CBC WITH DIFFERENTIAL (CANCER CENTER ONLY)
BASO#: 0.1 10*3/uL (ref 0.0–0.2)
BASO%: 2.6 % — ABNORMAL HIGH (ref 0.0–2.0)
EOS ABS: 0.2 10*3/uL (ref 0.0–0.5)
EOS%: 4.1 % (ref 0.0–7.0)
HCT: 32.5 % — ABNORMAL LOW (ref 38.7–49.9)
HEMOGLOBIN: 10.9 g/dL — AB (ref 13.0–17.1)
LYMPH#: 1.5 10*3/uL (ref 0.9–3.3)
LYMPH%: 36.8 % (ref 14.0–48.0)
MCH: 30.9 pg (ref 28.0–33.4)
MCHC: 33.5 g/dL (ref 32.0–35.9)
MCV: 92 fL (ref 82–98)
MONO#: 0.7 10*3/uL (ref 0.1–0.9)
MONO%: 17.7 % — AB (ref 0.0–13.0)
NEUT#: 1.6 10*3/uL (ref 1.5–6.5)
NEUT%: 38.8 % — AB (ref 40.0–80.0)
Platelets: 306 10*3/uL (ref 145–400)
RBC: 3.53 10*6/uL — ABNORMAL LOW (ref 4.20–5.70)
RDW: 20.3 % — AB (ref 11.1–15.7)
WBC: 4.2 10*3/uL (ref 4.0–10.0)

## 2014-07-23 MED ORDER — ALPRAZOLAM 1 MG PO TABS: 1.0000 mg | ORAL_TABLET | Freq: Three times a day (TID) | ORAL | Status: DC | PRN

## 2014-07-23 MED ORDER — OXYCODONE HCL ER 80 MG PO T12A: 80.0000 mg | EXTENDED_RELEASE_TABLET | Freq: Two times a day (BID) | ORAL | Status: DC

## 2014-07-23 MED ORDER — OXYCODONE HCL 30 MG PO TABS: 30.0000 mg | ORAL_TABLET | Freq: Four times a day (QID) | ORAL | Status: DC | PRN

## 2014-07-23 NOTE — Telephone Encounter (Signed)
Faxed SICKLE CELL AUTHORIZATION REQUEST form to:  [facility] [address] [place] [address] F: [phone] P: [phone]   Case: [id number] Dos: 07/23/2014 - 04/09/2015       COPY SCANNED

## 2014-07-23 NOTE — Progress Notes (Signed)
Hematology and Oncology Follow Up Visit  [name] [id number] [birth year] 34 y.o. 07/23/2014   Principle Diagnosis:   Hemoglobin SS disease  Current Therapy:    Folic acid 1 mg by mouth daily  Hydrea 500 mg by mouth twice a day  Phlebotomy to maintain his hemoglobin below 11.     Interim History:  [name] is back for followup. He was hospitalized at [facility] about a month ago. 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 He had a 8 unit exchange over [facility].  He feels pretty good right now. He is not complaining of any pain.  He's had no nausea or vomiting. He's had no cough. There's been no chest wall pain. They were concerned that he may have had acute chest syndrome area and that's why that they were very aggressive with exchanging him. After the exchange, his hemoglobin S was 30%.  He, again, feels pretty good. He's had no leg swelling. He's had no back discomfort. He's had no headache.  Medications:  Current outpatient prescriptions:  .  ALPRAZolam (XANAX) 1 MG tablet, Take 1 tablet (1 mg total) by mouth 3 (three) times daily as needed for anxiety., Disp: 90 tablet, Rfl: 0 .  Cyanocobalamin (VITAMIN B 12 PO), Take 1 tablet by mouth., Disp: , Rfl:  .  hydroxyurea (HYDREA) 500 MG capsule, Take 2 capsules (1,000 mg total) by mouth daily. May take with food to minimize GI side effects., Disp: 60 capsule, Rfl: 2 .  Lactulose SOLN, Take by mouth as needed. 1 tsp. As needed, Disp: , Rfl:  .  Multiple Vitamin (MULITIVITAMIN WITH MINERALS) TABS, Take 1 tablet by mouth daily., Disp: , Rfl:  .  ondansetron (ZOFRAN-ODT) 4 MG disintegrating tablet, , Disp: , Rfl: 2 .  OxyCODONE (OXYCONTIN) 80 mg T12A 12 hr tablet, Take 1 tablet (80 mg total) by mouth every 12 (twelve) hours., Disp: 60 tablet, Rfl: 0 .  oxycodone (ROXICODONE) 30 MG immediate release tablet, Take 1 tablet (30 mg total) by mouth every 6 (six) hours as needed for pain., Disp: 120 tablet, Rfl:  0 .  loperamide (IMODIUM) 2 MG capsule, Take 2 mg by mouth as needed. , Disp: , Rfl: 3  Allergies:  Allergies  Allergen Reactions  . Morphine And Related Rash  . Promethazine Hcl     Has hallucination when this drug mixed with other drugs  . Morphine Hives and Itching  . Promethazine Hcl Other (See Comments)    Has hallucination when this drug mixed with other drugs    Past Medical History, Surgical history, Social history, and Family History were reviewed and updated.  Review of Systems: As above  Physical Exam:  height is 5\' 6"  (1.676 m) and weight is 162 lb (73.483 kg). His oral temperature is 98.3 F (36.8 C). His blood pressure is 119/69 and his pulse is 66. His respiration is 18.   Well-developed and well-nourished African American gentleman. Head and neck exam shows no ocular or oral lesions. There is no scleral icterus. Lungs are clear. Cardiac exam regular rate and rhythm with no murmurs rubs or bruits. Abdomen soft. Has good bowel sounds. There is no fluid wave. There is no palpable liver or spleen tip. Back exam shows no tenderness over the spine. Extremities shows no clubbing cyanosis or edema. Skin exam no rashes or ulcerations. Neurological exam is nonfocal.  Lab Results  Component Value Date   WBC 4.2 07/23/2014   HGB 10.9*  07/23/2014   HCT 32.5* 07/23/2014   MCV 92 07/23/2014   PLT 306 07/23/2014     Chemistry      Component Value Date/Time   NA 139 07/23/2014 0904   NA 137 01/21/2014 1114   K 3.6 07/23/2014 0904   K 4.1 01/21/2014 1114   CL 106 07/23/2014 0904   CL 107 01/21/2014 1114   CO2 26 07/23/2014 0904   CO2 22 01/21/2014 1114   BUN 11 07/23/2014 0904   BUN 11 01/21/2014 1114   CREATININE 1.0 07/23/2014 0904   CREATININE 0.88 01/21/2014 1114      Component Value Date/Time   CALCIUM 9.9 07/23/2014 0904   CALCIUM 9.9 01/21/2014 1114   ALKPHOS 138* 07/23/2014 0904   ALKPHOS 77 01/21/2014 1114   AST 31 07/23/2014 0904   AST 48* 01/21/2014  1114   ALT 24 07/23/2014 0904   ALT 26 01/21/2014 1114   BILITOT 1.30 07/23/2014 0904   BILITOT 3.2* 01/21/2014 1114        Impression and Plan: [name] is 34 year old gentleman with hemoglobin SS disease. He looks quite good. I'm glad that he had the 8 units exchange. Hopefully, we will find that his hemoglobin S level will continue to be on the low side.  He's looking for to the [facility] playoffs. He has tickets for the [employer].  I don't think he needs any phlebotomies today.  I want to see him back in 6 weeks.  I refilled his prescriptions to be filled on May 5. It is easier when he comes in since he lives in [place] and I do want him driving all evacuated just for prescriptions.    Josph Macho, MD 4/15/201611:27 AM

## 2014-07-27 LAB — HEMOGLOBINOPATHY EVALUATION
Hemoglobin Other: 0 %
Hgb A2 Quant: 3.1 % (ref 2.2–3.2)
Hgb A: 35.8 % — ABNORMAL LOW (ref 96.8–97.8)
Hgb F Quant: 6.9 % — ABNORMAL HIGH (ref 0.0–2.0)
Hgb S Quant: 54.2 % — ABNORMAL HIGH

## 2014-07-29 DIAGNOSIS — D573 Sickle-cell trait: Secondary | ICD-10-CM | POA: Diagnosis not present

## 2014-07-29 DIAGNOSIS — H3520 Other non-diabetic proliferative retinopathy, unspecified eye: Secondary | ICD-10-CM | POA: Diagnosis not present

## 2014-07-29 DIAGNOSIS — H52223 Regular astigmatism, bilateral: Secondary | ICD-10-CM | POA: Diagnosis not present

## 2014-07-29 DIAGNOSIS — H25013 Cortical age-related cataract, bilateral: Secondary | ICD-10-CM | POA: Diagnosis not present

## 2014-07-29 DIAGNOSIS — H179 Unspecified corneal scar and opacity: Secondary | ICD-10-CM | POA: Diagnosis not present

## 2014-07-29 DIAGNOSIS — H5213 Myopia, bilateral: Secondary | ICD-10-CM | POA: Diagnosis not present

## 2014-09-02 ENCOUNTER — Ambulatory Visit (HOSPITAL_BASED_OUTPATIENT_CLINIC_OR_DEPARTMENT_OTHER): Payer: Medicare Other

## 2014-09-02 ENCOUNTER — Other Ambulatory Visit: Payer: Self-pay | Admitting: *Deleted

## 2014-09-02 ENCOUNTER — Other Ambulatory Visit (HOSPITAL_BASED_OUTPATIENT_CLINIC_OR_DEPARTMENT_OTHER): Payer: Medicare Other

## 2014-09-02 ENCOUNTER — Ambulatory Visit (HOSPITAL_BASED_OUTPATIENT_CLINIC_OR_DEPARTMENT_OTHER): Payer: Medicare Other | Admitting: Family

## 2014-09-02 VITALS — BP 127/66 | HR 69 | Resp 20

## 2014-09-02 VITALS — BP 121/72 | HR 74 | Temp 98.1°F | Resp 18 | Wt 166.0 lb

## 2014-09-02 DIAGNOSIS — D5700 Hb-SS disease with crisis, unspecified: Secondary | ICD-10-CM | POA: Diagnosis not present

## 2014-09-02 DIAGNOSIS — D571 Sickle-cell disease without crisis: Secondary | ICD-10-CM

## 2014-09-02 LAB — RETICULOCYTES (CHCC)
ABS RETIC: 284.5 10*3/uL — AB (ref 19.0–186.0)
RBC.: 4.31 MIL/uL (ref 4.22–5.81)
RETIC CT PCT: 6.6 % — AB (ref 0.4–2.3)

## 2014-09-02 LAB — FERRITIN CHCC: Ferritin: 46 ng/ml (ref 22–316)

## 2014-09-02 LAB — CBC WITH DIFFERENTIAL (CANCER CENTER ONLY)
BASO#: 0.1 10*3/uL (ref 0.0–0.2)
BASO%: 1 % (ref 0.0–2.0)
EOS ABS: 0.2 10*3/uL (ref 0.0–0.5)
EOS%: 2.4 % (ref 0.0–7.0)
HCT: 34.4 % — ABNORMAL LOW (ref 38.7–49.9)
HGB: 12 g/dL — ABNORMAL LOW (ref 13.0–17.1)
LYMPH#: 1.9 10*3/uL (ref 0.9–3.3)
LYMPH%: 26.4 % (ref 14.0–48.0)
MCH: 28.5 pg (ref 28.0–33.4)
MCHC: 34.9 g/dL (ref 32.0–35.9)
MCV: 82 fL (ref 82–98)
MONO#: 0.9 10*3/uL (ref 0.1–0.9)
MONO%: 12.1 % (ref 0.0–13.0)
NEUT#: 4.2 10*3/uL (ref 1.5–6.5)
NEUT%: 58.1 % (ref 40.0–80.0)
PLATELETS: 420 10*3/uL — AB (ref 145–400)
RBC: 4.21 10*6/uL (ref 4.20–5.70)
RDW: 21 % — AB (ref 11.1–15.7)
WBC: 7.2 10*3/uL (ref 4.0–10.0)

## 2014-09-02 LAB — IRON AND TIBC CHCC
%SAT: 20 % (ref 20–55)
Iron: 86 ug/dL (ref 42–163)
TIBC: 428 ug/dL — AB (ref 202–409)
UIBC: 342 ug/dL (ref 117–376)

## 2014-09-02 LAB — COMPREHENSIVE METABOLIC PANEL
ALBUMIN: 4.8 g/dL (ref 3.5–5.2)
ALT: 23 U/L (ref 0–53)
AST: 34 U/L (ref 0–37)
Alkaline Phosphatase: 138 U/L — ABNORMAL HIGH (ref 39–117)
BUN: 11 mg/dL (ref 6–23)
CHLORIDE: 106 meq/L (ref 96–112)
CO2: 20 meq/L (ref 19–32)
CREATININE: 0.9 mg/dL (ref 0.50–1.35)
Calcium: 10.3 mg/dL (ref 8.4–10.5)
GLUCOSE: 83 mg/dL (ref 70–99)
POTASSIUM: 4.3 meq/L (ref 3.5–5.3)
SODIUM: 139 meq/L (ref 135–145)
Total Bilirubin: 1.9 mg/dL — ABNORMAL HIGH (ref 0.2–1.2)
Total Protein: 8.6 g/dL — ABNORMAL HIGH (ref 6.0–8.3)

## 2014-09-02 LAB — CHCC SATELLITE - SMEAR

## 2014-09-02 MED ORDER — OXYCODONE HCL 30 MG PO TABS: 30.0000 mg | ORAL_TABLET | Freq: Four times a day (QID) | ORAL | Status: DC | PRN

## 2014-09-02 MED ORDER — HYDROXYUREA 500 MG PO CAPS: 1000.0000 mg | ORAL_CAPSULE | Freq: Every day | ORAL | Status: AC

## 2014-09-02 MED ORDER — LACTULOSE SOLN: 5.0000 mL | Status: AC | PRN

## 2014-09-02 MED ORDER — OXYCODONE HCL ER 80 MG PO T12A: 80.0000 mg | EXTENDED_RELEASE_TABLET | Freq: Two times a day (BID) | ORAL | Status: DC

## 2014-09-02 MED ORDER — LACTULOSE SOLN: 5.0000 mL | Status: DC | PRN

## 2014-09-02 MED ORDER — ALPRAZOLAM 1 MG PO TABS: 1.0000 mg | ORAL_TABLET | Freq: Three times a day (TID) | ORAL | Status: DC | PRN

## 2014-09-02 NOTE — Progress Notes (Signed)
Hematology and Oncology Follow Up Visit  [name] [id number] May 05, 1980 34 y.o. 09/02/2014   Principle Diagnosis:  Hemoglobin SS disease  Current Therapy:   Folic acid 1 mg by mouth daily Hydrea 500 mg by mouth twice a day Phlebotomy to maintain his hemoglobin below 11    Interim History:  [name] is here today for a follow-up. He is doing better. His last crisis was in March and he was hospitalized and exchanged at [facility]. He has had a few pain crises but has been able to deal with them at home and not go to the ED.  His Hgb S was 54% in April.  He denies pain at this time. He has had no SOB, blurred vision, rash, chest pain, palpitations, abdominal pain, constipation, diarrhea, blood in urine or stool.  No swelling, tenderness, numbness or tingling in his extremities.  His appetite is good and he is staying hydrated.   Medications:    Medication List       This list is accurate as of: 09/02/14  1:04 PM.  Always use your most recent med list.               ALPRAZolam 1 MG tablet  Commonly known as:  XANAX  Take 1 tablet (1 mg total) by mouth 3 (three) times daily as needed for anxiety.     hydroxyurea 500 MG capsule  Commonly known as:  HYDREA  Take 2 capsules (1,000 mg total) by mouth daily. May take with food to minimize GI side effects.     Lactulose Soln  Take 5 mLs by mouth as needed. 1 tsp. As needed     loperamide 2 MG capsule  Commonly known as:  IMODIUM  Take 2 mg by mouth as needed.     multivitamin with minerals Tabs tablet  Take 1 tablet by mouth daily.     ondansetron 4 MG disintegrating tablet  Commonly known as:  ZOFRAN-ODT     OxyCODONE 80 mg T12a 12 hr tablet  Commonly known as:  OXYCONTIN  Take 1 tablet (80 mg total) by mouth every 12 (twelve) hours.     oxycodone 30 MG immediate release tablet  Commonly known as:  ROXICODONE  Take 1 tablet (30 mg total) by mouth every 6 (six) hours as needed for pain.     VITAMIN B 12 PO  Take  1 tablet by mouth.        Allergies:  Allergies  Allergen Reactions  . Morphine And Related Rash  . Promethazine Hcl     Has hallucination when this drug mixed with other drugs  . Morphine Hives and Itching  . Promethazine Hcl Other (See Comments)    Has hallucination when this drug mixed with other drugs    Past Medical History, Surgical history, Social history, and Family History were reviewed and updated.  Review of Systems: All other 10 point review of systems is negative.   Physical Exam:  weight is 166 lb (75.297 kg). His oral temperature is 98.1 F (36.7 C). His blood pressure is 121/72 and his pulse is 74. His respiration is 18.   Wt Readings from Last 3 Encounters:  09/02/14 166 lb (75.297 kg)  07/23/14 162 lb (73.483 kg)  05/26/14 167 lb (75.751 kg)    Ocular: Sclerae unicteric, pupils equal, round and reactive to light Ear-nose-throat: Oropharynx clear, dentition fair Lymphatic: No cervical or supraclavicular adenopathy Lungs no rales or rhonchi, good excursion bilaterally Heart regular rate  and rhythm, no murmur appreciated Abd soft, nontender, positive bowel sounds MSK no focal spinal tenderness, no joint edema Neuro: non-focal, well-oriented, appropriate affect  Lab Results  Component Value Date   WBC 7.2 09/02/2014   HGB 12.0* 09/02/2014   HCT 34.4* 09/02/2014   MCV 82 09/02/2014   PLT 420* 09/02/2014   Lab Results  Component Value Date   FERRITIN 27 07/23/2014   IRON 110 07/23/2014   TIBC 396 07/23/2014   UIBC 286 07/23/2014   IRONPCTSAT 28 07/23/2014   Lab Results  Component Value Date   RETICCTPCT 11.4* 05/26/2014   RBC 4.21 09/02/2014   RETICCTABS 464.0* 05/26/2014   No results found for: KPAFRELGTCHN, LAMBDASER, KAPLAMBRATIO No results found for: Loel LoftyGGSERUM, IGA, IGMSERUM Lab Results  Component Value Date   TOTALPROTELP 6.4 03/01/2010   ALBUMINELP 53.7* 03/01/2010   A1GS 5.3* 03/01/2010   A2GS 6.8* 03/01/2010   BETS 5.9  03/01/2010   BETA2SER 7.3* 03/01/2010   GAMS 21.0* 03/01/2010   MSPIKE NOT DETECTED 03/01/2010   SPEI  03/01/2010    (NOTE) Nonspecific diffuse polyclonal type increase in gamma globulins. Reviewed by Dallas BreedingJanice J. Hessling, MD, PhD, FCAP (Electronic Signature on File)     Chemistry      Component Value Date/Time   NA 139 07/23/2014 0904   NA 137 01/21/2014 1114   K 3.6 07/23/2014 0904   K 4.1 01/21/2014 1114   CL 106 07/23/2014 0904   CL 107 01/21/2014 1114   CO2 26 07/23/2014 0904   CO2 22 01/21/2014 1114   BUN 11 07/23/2014 0904   BUN 11 01/21/2014 1114   CREATININE 1.0 07/23/2014 0904   CREATININE 0.88 01/21/2014 1114      Component Value Date/Time   CALCIUM 9.9 07/23/2014 0904   CALCIUM 9.9 01/21/2014 1114   ALKPHOS 138* 07/23/2014 0904   ALKPHOS 77 01/21/2014 1114   AST 31 07/23/2014 0904   AST 48* 01/21/2014 1114   ALT 24 07/23/2014 0904   ALT 26 01/21/2014 1114   BILITOT 1.30 07/23/2014 0904   BILITOT 3.2* 01/21/2014 1114     Impression and Plan: [name] is 34 year old gentleman with hemoglobin SS disease. He is doing well and has not had a major crisis since March.  His Hgb S in April was 54%.  His Hgb today is 12.0 so we will phlebotomize him.  We also refilled his Hydrea, pain medication and xanax today.  We will plan to see him back in 6 weeks for labs and follow-up.  He knows to call here with any questions or concerns. We can certainly see him sooner if need be.   Verdie MosherINCINNATI,SARAH M, NP 5/26/20161:04 PM

## 2014-09-02 NOTE — Progress Notes (Signed)
Kerrie PleasureYaw N Snipe presents today for phlebotomy per MD orders. Phlebotomy procedure started at 1205 and ended at 1210. Initially cannulated with 20G needle without success. Used 16G on posterior right arm with good results. Approximately 500ml removed. Patient observed for 30 minutes after procedure without any incident. Patient tolerated procedure well. IV needle removed intact.

## 2014-09-02 NOTE — Patient Instructions (Signed)

## 2014-10-12 ENCOUNTER — Other Ambulatory Visit: Payer: Self-pay | Admitting: *Deleted

## 2014-10-12 DIAGNOSIS — D5700 Hb-SS disease with crisis, unspecified: Secondary | ICD-10-CM

## 2014-10-12 MED ORDER — OXYCODONE HCL 30 MG PO TABS: 30.0000 mg | ORAL_TABLET | Freq: Four times a day (QID) | ORAL | Status: DC | PRN

## 2014-10-12 MED ORDER — OXYCODONE HCL ER 80 MG PO T12A: 80.0000 mg | EXTENDED_RELEASE_TABLET | Freq: Two times a day (BID) | ORAL | Status: DC

## 2014-10-14 ENCOUNTER — Encounter: Payer: Self-pay | Admitting: Hematology & Oncology

## 2014-10-14 ENCOUNTER — Ambulatory Visit (HOSPITAL_BASED_OUTPATIENT_CLINIC_OR_DEPARTMENT_OTHER): Payer: Medicare Other | Admitting: Hematology & Oncology

## 2014-10-14 ENCOUNTER — Other Ambulatory Visit (HOSPITAL_BASED_OUTPATIENT_CLINIC_OR_DEPARTMENT_OTHER): Payer: Medicare Other

## 2014-10-14 VITALS — BP 115/67 | HR 74 | Temp 97.4°F | Resp 18 | Ht 66.0 in | Wt 165.0 lb

## 2014-10-14 DIAGNOSIS — D5700 Hb-SS disease with crisis, unspecified: Secondary | ICD-10-CM

## 2014-10-14 DIAGNOSIS — D571 Sickle-cell disease without crisis: Secondary | ICD-10-CM | POA: Diagnosis not present

## 2014-10-14 DIAGNOSIS — F064 Anxiety disorder due to known physiological condition: Secondary | ICD-10-CM

## 2014-10-14 DIAGNOSIS — G4701 Insomnia due to medical condition: Secondary | ICD-10-CM

## 2014-10-14 LAB — CBC WITH DIFFERENTIAL (CANCER CENTER ONLY)
BASO#: 0.1 10*3/uL (ref 0.0–0.2)
BASO%: 0.9 % (ref 0.0–2.0)
EOS ABS: 0.1 10*3/uL (ref 0.0–0.5)
EOS%: 1.7 % (ref 0.0–7.0)
HEMATOCRIT: 33.2 % — AB (ref 38.7–49.9)
HGB: 11.6 g/dL — ABNORMAL LOW (ref 13.0–17.1)
LYMPH#: 2.3 10*3/uL (ref 0.9–3.3)
LYMPH%: 29.5 % (ref 14.0–48.0)
MCH: 28.2 pg (ref 28.0–33.4)
MCHC: 34.9 g/dL (ref 32.0–35.9)
MCV: 81 fL — ABNORMAL LOW (ref 82–98)
MONO#: 0.8 10*3/uL (ref 0.1–0.9)
MONO%: 10.9 % (ref 0.0–13.0)
NEUT#: 4.4 10*3/uL (ref 1.5–6.5)
NEUT%: 57 % (ref 40.0–80.0)
PLATELETS: 446 10*3/uL — AB (ref 145–400)
RBC: 4.12 10*6/uL — ABNORMAL LOW (ref 4.20–5.70)
RDW: 21.6 % — AB (ref 11.1–15.7)
WBC: 7.6 10*3/uL (ref 4.0–10.0)

## 2014-10-14 LAB — TECHNOLOGIST REVIEW CHCC SATELLITE

## 2014-10-14 LAB — CHCC SATELLITE - SMEAR

## 2014-10-14 MED ORDER — ALPRAZOLAM 1 MG PO TABS: 1.0000 mg | ORAL_TABLET | Freq: Three times a day (TID) | ORAL | Status: DC | PRN

## 2014-10-14 MED ORDER — OXYCODONE HCL ER 80 MG PO T12A: 80.0000 mg | EXTENDED_RELEASE_TABLET | Freq: Two times a day (BID) | ORAL | Status: DC

## 2014-10-14 MED ORDER — OXYCODONE HCL 30 MG PO TABS: 30.0000 mg | ORAL_TABLET | Freq: Four times a day (QID) | ORAL | Status: DC | PRN

## 2014-10-14 MED ORDER — TRAZODONE HCL 100 MG PO TABS: 100.0000 mg | ORAL_TABLET | Freq: Every evening | ORAL | Status: DC | PRN

## 2014-10-14 NOTE — Progress Notes (Signed)
Hematology and Oncology Follow Up Visit  [name] [id number] 02-25-1981 34 y.o. 10/14/2014   Principle Diagnosis:   Hemoglobin SS disease  Current Therapy:    Folic acid 1 mg by mouth daily  Hydrea 500 mg by mouth twice a day  Phlebotomy to maintain his hemoglobin below 11.     Interim History:  [name] is back for followup. He is doing okay. He's had no problems over the summer so far. He is drinking a lot of fluid. He's had no crises. He's had no cough. He's had no shortness of breath. He's had no change in bowel or bladder habits. He's had no fever. His had no leg swelling. He's had no joint issues.   We last saw him in May, his ferritin was only 46 with iron saturation of 20%. His last hemoglobin electrophoresis done in April showed 36% hemoglobin A and 54% hemoglobin S. He did have 7% hemoglobin F.  He's had no nausea or vomiting. His appetite has been good. Swelling.   Medications:  Current outpatient prescriptions:  .  ALPRAZolam (XANAX) 1 MG tablet, Take 1 tablet (1 mg total) by mouth 3 (three) times daily as needed for anxiety. Do not fill until August 4th, Disp: 90 tablet, Rfl: 0 .  Cyanocobalamin (VITAMIN B 12 PO), Take 1 tablet by mouth., Disp: , Rfl:  .  hydroxyurea (HYDREA) 500 MG capsule, Take 2 capsules (1,000 mg total) by mouth daily. May take with food to minimize GI side effects., Disp: 60 capsule, Rfl: 2 .  Lactulose SOLN, Take 5 mLs by mouth as needed. 1 tsp. As needed, Disp: 500 mL, Rfl: 2 .  loperamide (IMODIUM) 2 MG capsule, Take 2 mg by mouth as needed. , Disp: , Rfl: 3 .  Multiple Vitamin (MULITIVITAMIN WITH MINERALS) TABS, Take 1 tablet by mouth daily., Disp: , Rfl:  .  OxyCODONE (OXYCONTIN) 80 mg T12A 12 hr tablet, Take 1 tablet (80 mg total) by mouth every 12 (twelve) hours., Disp: 60 tablet, Rfl: 0 .  oxycodone (ROXICODONE) 30 MG immediate release tablet, Take 1 tablet (30 mg total) by mouth every 6 (six) hours as needed for pain., Disp: 120  tablet, Rfl: 0 .  traZODone (DESYREL) 100 MG tablet, Take 1 tablet (100 mg total) by mouth at bedtime as needed for sleep., Disp: 30 tablet, Rfl: 3  Allergies:  Allergies  Allergen Reactions  . Morphine And Related Rash  . Promethazine Hcl     Has hallucination when this drug mixed with other drugs  . Morphine Hives and Itching  . Promethazine Hcl Other (See Comments)    Has hallucination when this drug mixed with other drugs    Past Medical History, Surgical history, Social history, and Family History were reviewed and updated.  Review of Systems: As above  Physical Exam:  height is 5\' 6"  (1.676 m) and weight is 165 lb (74.844 kg). His oral temperature is 97.4 F (36.3 C). His blood pressure is 115/67 and his pulse is 74. His respiration is 18.   Well-developed and well-nourished African American gentleman. Head and neck exam shows no ocular or oral lesions. There is no scleral icterus. Lungs are clear. Cardiac exam regular rate and rhythm with no murmurs rubs or bruits. Abdomen soft. Has good bowel sounds. There is no fluid wave. There is no palpable liver or spleen tip. Back exam shows no tenderness over the spine. Extremities shows no clubbing cyanosis or edema. Skin exam no rashes or  ulcerations. Neurological exam is nonfocal.  Lab Results  Component Value Date   WBC 7.6 10/14/2014   HGB 11.6* 10/14/2014   HCT 33.2* 10/14/2014   MCV 81* 10/14/2014   PLT 446* 10/14/2014     Chemistry      Component Value Date/Time   NA 138 10/14/2014 1112   NA 139 07/23/2014 0904   K 4.1 10/14/2014 1112   K 3.6 07/23/2014 0904   CL 109 10/14/2014 1112   CL 106 07/23/2014 0904   CO2 19 10/14/2014 1112   CO2 26 07/23/2014 0904   BUN 11 10/14/2014 1112   BUN 11 07/23/2014 0904   CREATININE 0.88 10/14/2014 1112   CREATININE 1.0 07/23/2014 0904      Component Value Date/Time   CALCIUM 9.8 10/14/2014 1112   CALCIUM 9.9 07/23/2014 0904   ALKPHOS 98 10/14/2014 1112   ALKPHOS 138*  07/23/2014 0904   AST 30 10/14/2014 1112   AST 31 07/23/2014 0904   ALT 19 10/14/2014 1112   ALT 24 07/23/2014 0904   BILITOT 1.8* 10/14/2014 1112   BILITOT 1.30 07/23/2014 0904        Impression and Plan: [name] is 34 year old gentleman with hemoglobin SS disease. He looks quite good.   Again, I will hold on doing a phlebotomy on him.  I want to see him back in about 2 months. I refilled his ascriptions.Josph Macho, MD 7/7/20165:01 PM

## 2014-10-15 LAB — IRON AND TIBC CHCC
%SAT: 11 % — AB (ref 20–55)
IRON: 43 ug/dL (ref 42–163)
TIBC: 400 ug/dL (ref 202–409)
UIBC: 357 ug/dL (ref 117–376)

## 2014-10-15 LAB — FERRITIN CHCC: FERRITIN: 31 ng/mL (ref 22–316)

## 2014-10-18 LAB — COMPREHENSIVE METABOLIC PANEL
ALT: 19 U/L (ref 0–53)
AST: 30 U/L (ref 0–37)
Albumin: 4.3 g/dL (ref 3.5–5.2)
Alkaline Phosphatase: 98 U/L (ref 39–117)
BILIRUBIN TOTAL: 1.8 mg/dL — AB (ref 0.2–1.2)
BUN: 11 mg/dL (ref 6–23)
CO2: 19 mEq/L (ref 19–32)
CREATININE: 0.88 mg/dL (ref 0.50–1.35)
Calcium: 9.8 mg/dL (ref 8.4–10.5)
Chloride: 109 mEq/L (ref 96–112)
Glucose, Bld: 75 mg/dL (ref 70–99)
Potassium: 4.1 mEq/L (ref 3.5–5.3)
Sodium: 138 mEq/L (ref 135–145)
Total Protein: 7.6 g/dL (ref 6.0–8.3)

## 2014-10-18 LAB — RETICULOCYTES (CHCC)
ABS RETIC: 382.7 10*3/uL — AB (ref 19.0–186.0)
RBC.: 4.16 MIL/uL — AB (ref 4.22–5.81)
RETIC CT PCT: 9.2 % — AB (ref 0.4–2.3)

## 2014-10-18 LAB — HEMOGLOBINOPATHY EVALUATION
HGB A2 QUANT: 3.6 % — AB (ref 2.2–3.2)
Hemoglobin Other: 0 %
Hgb A: 7.2 % — ABNORMAL LOW (ref 96.8–97.8)
Hgb F Quant: 11.2 % — ABNORMAL HIGH (ref 0.0–2.0)
Hgb S Quant: 78 % — ABNORMAL HIGH

## 2014-12-08 ENCOUNTER — Other Ambulatory Visit: Payer: Self-pay | Admitting: *Deleted

## 2014-12-08 DIAGNOSIS — D5700 Hb-SS disease with crisis, unspecified: Secondary | ICD-10-CM

## 2014-12-08 DIAGNOSIS — G4701 Insomnia due to medical condition: Secondary | ICD-10-CM

## 2014-12-08 DIAGNOSIS — F064 Anxiety disorder due to known physiological condition: Secondary | ICD-10-CM

## 2014-12-08 MED ORDER — ALPRAZOLAM 1 MG PO TABS: 1.0000 mg | ORAL_TABLET | Freq: Three times a day (TID) | ORAL | Status: DC | PRN

## 2014-12-08 MED ORDER — OXYCODONE HCL ER 80 MG PO T12A: 80.0000 mg | EXTENDED_RELEASE_TABLET | Freq: Two times a day (BID) | ORAL | Status: DC

## 2014-12-08 MED ORDER — OXYCODONE HCL 30 MG PO TABS: 30.0000 mg | ORAL_TABLET | Freq: Four times a day (QID) | ORAL | Status: DC | PRN

## 2014-12-16 ENCOUNTER — Ambulatory Visit (HOSPITAL_BASED_OUTPATIENT_CLINIC_OR_DEPARTMENT_OTHER): Payer: Medicare Other

## 2014-12-16 ENCOUNTER — Ambulatory Visit (HOSPITAL_BASED_OUTPATIENT_CLINIC_OR_DEPARTMENT_OTHER): Payer: Medicare Other | Admitting: Hematology & Oncology

## 2014-12-16 ENCOUNTER — Other Ambulatory Visit (HOSPITAL_BASED_OUTPATIENT_CLINIC_OR_DEPARTMENT_OTHER): Payer: Medicare Other

## 2014-12-16 ENCOUNTER — Encounter: Payer: Self-pay | Admitting: Hematology & Oncology

## 2014-12-16 VITALS — BP 112/65 | HR 67 | Temp 98.0°F | Resp 16 | Ht 66.0 in | Wt 170.0 lb

## 2014-12-16 DIAGNOSIS — Z0000 Encounter for general adult medical examination without abnormal findings: Secondary | ICD-10-CM | POA: Diagnosis not present

## 2014-12-16 DIAGNOSIS — D5700 Hb-SS disease with crisis, unspecified: Secondary | ICD-10-CM

## 2014-12-16 DIAGNOSIS — G4701 Insomnia due to medical condition: Secondary | ICD-10-CM

## 2014-12-16 DIAGNOSIS — Z23 Encounter for immunization: Secondary | ICD-10-CM

## 2014-12-16 DIAGNOSIS — F064 Anxiety disorder due to known physiological condition: Secondary | ICD-10-CM | POA: Diagnosis not present

## 2014-12-16 DIAGNOSIS — F418 Other specified anxiety disorders: Secondary | ICD-10-CM

## 2014-12-16 LAB — CBC WITH DIFFERENTIAL (CANCER CENTER ONLY)
BASO#: 0.1 10*3/uL (ref 0.0–0.2)
BASO%: 0.9 % (ref 0.0–2.0)
EOS ABS: 0.1 10*3/uL (ref 0.0–0.5)
EOS%: 1.8 % (ref 0.0–7.0)
HCT: 32.9 % — ABNORMAL LOW (ref 38.7–49.9)
HEMOGLOBIN: 11.1 g/dL — AB (ref 13.0–17.1)
LYMPH#: 2 10*3/uL (ref 0.9–3.3)
LYMPH%: 25.7 % (ref 14.0–48.0)
MCH: 22.6 pg — ABNORMAL LOW (ref 28.0–33.4)
MCHC: 33.7 g/dL (ref 32.0–35.9)
MCV: 67 fL — ABNORMAL LOW (ref 82–98)
MONO#: 0.9 10*3/uL (ref 0.1–0.9)
MONO%: 11.9 % (ref 0.0–13.0)
NEUT#: 4.7 10*3/uL (ref 1.5–6.5)
NEUT%: 59.7 % (ref 40.0–80.0)
PLATELETS: 399 10*3/uL (ref 145–400)
RBC: 4.92 10*6/uL (ref 4.20–5.70)
RDW: 23.7 % — ABNORMAL HIGH (ref 11.1–15.7)
WBC: 7.8 10*3/uL (ref 4.0–10.0)

## 2014-12-16 LAB — COMPREHENSIVE METABOLIC PANEL (CC13)
ALT: 38 U/L (ref 0–55)
ANION GAP: 9 meq/L (ref 3–11)
AST: 41 U/L — ABNORMAL HIGH (ref 5–34)
Albumin: 4.1 g/dL (ref 3.5–5.0)
Alkaline Phosphatase: 126 U/L (ref 40–150)
BILIRUBIN TOTAL: 1.69 mg/dL — AB (ref 0.20–1.20)
BUN: 12.9 mg/dL (ref 7.0–26.0)
CALCIUM: 9.7 mg/dL (ref 8.4–10.4)
CO2: 19 meq/L — AB (ref 22–29)
CREATININE: 1 mg/dL (ref 0.7–1.3)
Chloride: 113 mEq/L — ABNORMAL HIGH (ref 98–109)
EGFR: >90 mL/min/{1.73_m2} (ref >90)
Glucose: 90 mg/dl (ref 70–140)
Potassium: 4.3 mEq/L (ref 3.5–5.1)
Sodium: 142 mEq/L (ref 136–145)
TOTAL PROTEIN: 7.7 g/dL (ref 6.4–8.3)

## 2014-12-16 LAB — IRON AND TIBC CHCC
%SAT: 11 % — AB (ref 20–55)
IRON: 47 ug/dL (ref 42–163)
TIBC: 425 ug/dL — ABNORMAL HIGH (ref 202–409)
UIBC: 378 ug/dL — AB (ref 117–376)

## 2014-12-16 LAB — TECHNOLOGIST REVIEW CHCC SATELLITE

## 2014-12-16 LAB — FERRITIN CHCC: Ferritin: 35 ng/ml (ref 22–316)

## 2014-12-16 MED ORDER — OXYCODONE HCL ER 80 MG PO T12A: 80.0000 mg | EXTENDED_RELEASE_TABLET | Freq: Two times a day (BID) | ORAL | Status: DC

## 2014-12-16 MED ORDER — INFLUENZA VAC SPLIT QUAD 0.5 ML IM SUSY
0.5000 mL | PREFILLED_SYRINGE | Freq: Once | INTRAMUSCULAR | Status: AC
  Administered 2014-12-16: 0.5 mL via INTRAMUSCULAR
  Filled 2014-12-16: qty 0.5

## 2014-12-16 MED ORDER — ALPRAZOLAM 1 MG PO TABS: 1.0000 mg | ORAL_TABLET | Freq: Three times a day (TID) | ORAL | Status: DC | PRN

## 2014-12-16 MED ORDER — OXYCODONE HCL 30 MG PO TABS: 30.0000 mg | ORAL_TABLET | Freq: Four times a day (QID) | ORAL | Status: DC | PRN

## 2014-12-16 NOTE — Patient Instructions (Signed)

## 2014-12-16 NOTE — Progress Notes (Signed)
No phlebotomy today per dr. ennever 

## 2014-12-16 NOTE — Progress Notes (Signed)
Hematology and Oncology Follow Up Visit  [name] [id number] 1981-02-24 34 y.o. 12/16/2014   Principle Diagnosis:   Hemoglobin SS disease  Current Therapy:    Folic acid 1 mg by mouth daily  Hydrea 500 mg by mouth twice a day  Phlebotomy to maintain his hemoglobin below 11.     Interim History:  Mr.  [name] is back for followup. He is doing okay. He's had no problems over the summer . He will be going to [place] next week. He'll be going on vacation. He has fiance are going.  He's had no issues with crises. He's had no shortness of breath. He's had no chest wall pain. He's had no change in bowel or bladder habits.  He's had no rashes.  He's had no headache.  His pain medication regimen is doing quite well for him. He's had no nausea or vomiting. His appetite has been good.   Overall, his performance status is ECOG 0   Medications:  Current outpatient prescriptions:  .  ALPRAZolam (XANAX) 1 MG tablet, Take 1 tablet (1 mg total) by mouth 3 (three) times daily as needed for anxiety., Disp: 90 tablet, Rfl: 0 .  Cyanocobalamin (VITAMIN B 12 PO), Take 1 tablet by mouth., Disp: , Rfl:  .  hydroxyurea (HYDREA) 500 MG capsule, Take 2 capsules (1,000 mg total) by mouth daily. May take with food to minimize GI side effects., Disp: 60 capsule, Rfl: 2 .  Lactulose SOLN, Take 5 mLs by mouth as needed. 1 tsp. As needed, Disp: 500 mL, Rfl: 2 .  loperamide (IMODIUM) 2 MG capsule, Take 2 mg by mouth as needed. , Disp: , Rfl: 3 .  Multiple Vitamin (MULITIVITAMIN WITH MINERALS) TABS, Take 1 tablet by mouth daily., Disp: , Rfl:  .  OxyCODONE (OXYCONTIN) 80 mg T12A 12 hr tablet, Take 1 tablet (80 mg total) by mouth every 12 (twelve) hours., Disp: 60 tablet, Rfl: 0 .  oxycodone (ROXICODONE) 30 MG immediate release tablet, Take 1 tablet (30 mg total) by mouth every 6 (six) hours as needed for pain., Disp: 120 tablet, Rfl: 0 .  traZODone (DESYREL) 100 MG tablet, Take 1 tablet (100 mg  total) by mouth at bedtime as needed for sleep., Disp: 30 tablet, Rfl: 3  Allergies:  Allergies  Allergen Reactions  . Morphine And Related Rash  . Promethazine Hcl     Has hallucination when this drug mixed with other drugs  . Morphine Hives and Itching  . Promethazine Hcl Other (See Comments)    Has hallucination when this drug mixed with other drugs    Past Medical History, Surgical history, Social history, and Family History were reviewed and updated.  Review of Systems: As above  Physical Exam:  height is 5\' 6"  (1.676 m) and weight is 170 lb (77.111 kg). His oral temperature is 98 F (36.7 C). His blood pressure is 112/65 and his pulse is 67. His respiration is 16.   Well-developed and well-nourished African American gentleman. Head and neck exam shows no ocular or oral lesions. There is no scleral icterus. Lungs are clear. Cardiac exam regular rate and rhythm with no murmurs rubs or bruits. Abdomen is soft.  He has good bowel sounds. There is no fluid wave. There is no palpable liver or spleen tip. Back exam shows no tenderness over the spine. Extremities shows no clubbing cyanosis or edema. Skin exam no rashes or ulcerations. Neurological exam is nonfocal.  Lab Results  Component Value Date  WBC 7.8 12/16/2014   HGB 11.1* 12/16/2014   HCT 32.9* 12/16/2014   MCV 67* 12/16/2014   PLT 399 12/16/2014     Chemistry      Component Value Date/Time   NA 142 12/16/2014 1027   NA 138 10/14/2014 1112   NA 139 07/23/2014 0904   K 4.3 12/16/2014 1027   K 4.1 10/14/2014 1112   K 3.6 07/23/2014 0904   CL 109 10/14/2014 1112   CL 106 07/23/2014 0904   CO2 19* 12/16/2014 1027   CO2 19 10/14/2014 1112   CO2 26 07/23/2014 0904   BUN 12.9 12/16/2014 1027   BUN 11 10/14/2014 1112   BUN 11 07/23/2014 0904   CREATININE 1.0 12/16/2014 1027   CREATININE 0.88 10/14/2014 1112   CREATININE 1.0 07/23/2014 0904      Component Value Date/Time   CALCIUM 9.7 12/16/2014 1027   CALCIUM  9.8 10/14/2014 1112   CALCIUM 9.9 07/23/2014 0904   ALKPHOS 126 12/16/2014 1027   ALKPHOS 98 10/14/2014 1112   ALKPHOS 138* 07/23/2014 0904   AST 41* 12/16/2014 1027   AST 30 10/14/2014 1112   AST 31 07/23/2014 0904   ALT 38 12/16/2014 1027   ALT 19 10/14/2014 1112   ALT 24 07/23/2014 0904   BILITOT 1.69* 12/16/2014 1027   BILITOT 1.8* 10/14/2014 1112   BILITOT 1.30 07/23/2014 0904        Impression and Plan: [name] is 34 year old gentleman with hemoglobin SS disease. He looks quite good.   Again, I will hold on doing a phlebotomy on him.  I told him to make sure he drinks a lot of fluid when he goes out into [place] I also told him to watch out for the Zika virus mosquito.   I want to see him back in about 2 months. I refilled his ascriptions.Josph Macho, MD 9/8/20161:40 PM

## 2014-12-20 LAB — HEMOGLOBINOPATHY EVALUATION
HGB A2 QUANT: 3.3 % — AB (ref 2.2–3.2)
HGB A: 0 % — AB (ref 96.8–97.8)
HGB F QUANT: 7.3 % — AB (ref 0.0–2.0)
HGB S QUANTITAION: 89.4 % — AB
Hemoglobin Other: 0 %

## 2014-12-20 LAB — RETICULOCYTES (CHCC)
ABS Retic: 210 10*3/uL — ABNORMAL HIGH (ref 19.0–186.0)
RBC.: 5 MIL/uL (ref 4.22–5.81)
Retic Ct Pct: 4.2 % — ABNORMAL HIGH (ref 0.4–2.3)

## 2014-12-27 DIAGNOSIS — R6510 Systemic inflammatory response syndrome (SIRS) of non-infectious origin without acute organ dysfunction: Secondary | ICD-10-CM | POA: Insufficient documentation

## 2014-12-27 DIAGNOSIS — J9600 Acute respiratory failure, unspecified whether with hypoxia or hypercapnia: Secondary | ICD-10-CM | POA: Insufficient documentation

## 2014-12-27 DIAGNOSIS — I517 Cardiomegaly: Secondary | ICD-10-CM | POA: Diagnosis not present

## 2014-12-27 DIAGNOSIS — F419 Anxiety disorder, unspecified: Secondary | ICD-10-CM | POA: Diagnosis not present

## 2014-12-27 DIAGNOSIS — D5700 Hb-SS disease with crisis, unspecified: Secondary | ICD-10-CM | POA: Diagnosis not present

## 2014-12-27 DIAGNOSIS — G8929 Other chronic pain: Secondary | ICD-10-CM | POA: Diagnosis not present

## 2014-12-27 DIAGNOSIS — R0602 Shortness of breath: Secondary | ICD-10-CM | POA: Diagnosis not present

## 2014-12-27 DIAGNOSIS — D571 Sickle-cell disease without crisis: Secondary | ICD-10-CM | POA: Diagnosis not present

## 2014-12-27 DIAGNOSIS — R079 Chest pain, unspecified: Secondary | ICD-10-CM | POA: Diagnosis not present

## 2014-12-27 DIAGNOSIS — D5701 Hb-SS disease with acute chest syndrome: Secondary | ICD-10-CM | POA: Diagnosis not present

## 2014-12-27 DIAGNOSIS — M25551 Pain in right hip: Secondary | ICD-10-CM | POA: Diagnosis not present

## 2014-12-27 DIAGNOSIS — J9601 Acute respiratory failure with hypoxia: Secondary | ICD-10-CM | POA: Diagnosis not present

## 2014-12-27 DIAGNOSIS — R112 Nausea with vomiting, unspecified: Secondary | ICD-10-CM | POA: Diagnosis not present

## 2015-01-05 ENCOUNTER — Telehealth: Payer: Self-pay | Admitting: Hematology & Oncology

## 2015-01-05 NOTE — Telephone Encounter (Signed)
AUTH: [id number]       CPT:   82956 - Phlebotomy       VALID: 08/08/2014 - 10/07/2014  DX: D57.00  AUTH: [id number] CPT:   95284 - Phlebotomy       VALID: 09/08/2014 - 10/07/2014  AUTH: [id number] CPT:   64403       VALID: 04/09/2014 - 04/09/2015

## 2015-01-07 ENCOUNTER — Encounter: Payer: Self-pay | Admitting: *Deleted

## 2015-01-13 ENCOUNTER — Other Ambulatory Visit: Payer: Medicare Other

## 2015-01-13 ENCOUNTER — Ambulatory Visit: Payer: Medicare Other | Admitting: Family

## 2015-02-09 ENCOUNTER — Other Ambulatory Visit: Payer: Self-pay | Admitting: *Deleted

## 2015-02-09 DIAGNOSIS — D5700 Hb-SS disease with crisis, unspecified: Secondary | ICD-10-CM

## 2015-02-09 DIAGNOSIS — Z0000 Encounter for general adult medical examination without abnormal findings: Secondary | ICD-10-CM

## 2015-02-09 DIAGNOSIS — G4701 Insomnia due to medical condition: Secondary | ICD-10-CM

## 2015-02-09 DIAGNOSIS — F064 Anxiety disorder due to known physiological condition: Secondary | ICD-10-CM

## 2015-02-09 MED ORDER — ALPRAZOLAM 1 MG PO TABS: 1.0000 mg | ORAL_TABLET | Freq: Three times a day (TID) | ORAL | Status: DC | PRN

## 2015-02-09 MED ORDER — OXYCODONE HCL 30 MG PO TABS: 30.0000 mg | ORAL_TABLET | Freq: Four times a day (QID) | ORAL | Status: DC | PRN

## 2015-02-09 MED ORDER — OXYCODONE HCL ER 80 MG PO T12A: 80.0000 mg | EXTENDED_RELEASE_TABLET | Freq: Two times a day (BID) | ORAL | Status: DC

## 2015-02-17 ENCOUNTER — Ambulatory Visit (HOSPITAL_BASED_OUTPATIENT_CLINIC_OR_DEPARTMENT_OTHER): Payer: Medicare Other | Admitting: Hematology & Oncology

## 2015-02-17 ENCOUNTER — Ambulatory Visit: Payer: Medicare Other

## 2015-02-17 ENCOUNTER — Other Ambulatory Visit (HOSPITAL_BASED_OUTPATIENT_CLINIC_OR_DEPARTMENT_OTHER): Payer: Medicare Other

## 2015-02-17 VITALS — BP 123/64 | HR 101 | Temp 98.9°F | Resp 20 | Wt 164.5 lb

## 2015-02-17 DIAGNOSIS — D5700 Hb-SS disease with crisis, unspecified: Secondary | ICD-10-CM | POA: Diagnosis not present

## 2015-02-17 DIAGNOSIS — G4701 Insomnia due to medical condition: Secondary | ICD-10-CM | POA: Diagnosis not present

## 2015-02-17 DIAGNOSIS — F064 Anxiety disorder due to known physiological condition: Secondary | ICD-10-CM | POA: Diagnosis not present

## 2015-02-17 DIAGNOSIS — Z0000 Encounter for general adult medical examination without abnormal findings: Secondary | ICD-10-CM

## 2015-02-17 DIAGNOSIS — F418 Other specified anxiety disorders: Secondary | ICD-10-CM

## 2015-02-17 LAB — CBC WITH DIFFERENTIAL (CANCER CENTER ONLY)
BASO#: 0.1 10*3/uL (ref 0.0–0.2)
BASO%: 0.6 % (ref 0.0–2.0)
EOS ABS: 0.1 10*3/uL (ref 0.0–0.5)
EOS%: 1.5 % (ref 0.0–7.0)
HCT: 31.3 % — ABNORMAL LOW (ref 38.7–49.9)
HGB: 10.8 g/dL — ABNORMAL LOW (ref 13.0–17.1)
LYMPH#: 3.1 10*3/uL (ref 0.9–3.3)
LYMPH%: 32 % (ref 14.0–48.0)
MCH: 28 pg (ref 28.0–33.4)
MCHC: 34.5 g/dL (ref 32.0–35.9)
MCV: 81 fL — AB (ref 82–98)
MONO#: 0.7 10*3/uL (ref 0.1–0.9)
MONO%: 7.4 % (ref 0.0–13.0)
NEUT#: 5.6 10*3/uL (ref 1.5–6.5)
NEUT%: 58.5 % (ref 40.0–80.0)
PLATELETS: 387 10*3/uL (ref 145–400)
RBC: 3.86 10*6/uL — ABNORMAL LOW (ref 4.20–5.70)
RDW: 23.4 % — AB (ref 11.1–15.7)
WBC: 9.6 10*3/uL (ref 4.0–10.0)

## 2015-02-17 LAB — COMPREHENSIVE METABOLIC PANEL (CC13)
ALK PHOS: 94 U/L (ref 40–150)
ALT: 32 U/L (ref 0–55)
AST: 43 U/L — AB (ref 5–34)
Albumin: 4.1 g/dL (ref 3.5–5.0)
Anion Gap: 8 mEq/L (ref 3–11)
BUN: 9.3 mg/dL (ref 7.0–26.0)
CHLORIDE: 109 meq/L (ref 98–109)
CO2: 21 meq/L — AB (ref 22–29)
Calcium: 9.8 mg/dL (ref 8.4–10.4)
Creatinine: 0.9 mg/dL (ref 0.7–1.3)
GLUCOSE: 130 mg/dL (ref 70–140)
POTASSIUM: 3.7 meq/L (ref 3.5–5.1)
SODIUM: 138 meq/L (ref 136–145)
Total Bilirubin: 2.18 mg/dL — ABNORMAL HIGH (ref 0.20–1.20)
Total Protein: 7.6 g/dL (ref 6.4–8.3)

## 2015-02-17 LAB — TECHNOLOGIST REVIEW CHCC SATELLITE

## 2015-02-17 MED ORDER — OXYCODONE HCL ER 80 MG PO T12A: 80.0000 mg | EXTENDED_RELEASE_TABLET | Freq: Two times a day (BID) | ORAL | Status: DC

## 2015-02-17 MED ORDER — OXYCODONE HCL 30 MG PO TABS: 30.0000 mg | ORAL_TABLET | Freq: Four times a day (QID) | ORAL | Status: DC | PRN

## 2015-02-17 MED ORDER — ALPRAZOLAM 1 MG PO TABS: 1.0000 mg | ORAL_TABLET | Freq: Three times a day (TID) | ORAL | Status: DC | PRN

## 2015-02-17 NOTE — Progress Notes (Signed)
No phlebotomy today per Dr Ennever 

## 2015-02-17 NOTE — Progress Notes (Signed)
Hematology and Oncology Follow Up Visit  [name] [id number] 23-Apr-1980 34 y.o. 02/17/2015   Principle Diagnosis:   Hemoglobin SS disease  Current Therapy:    Folic acid 1 mg by mouth daily  Hydrea 500 mg by mouth twice a day  Phlebotomy to maintain his hemoglobin below 11.     Interim History:  Mr.  [name] is back for followup. He is doing okay. He had a good time down in [place]. He was down there in September for vacation.  Unfortunately, [facility] for [facility]. In reviewing the notes, it sounds like he had acute chest syndrome. He was not exchanged. They just came supportive care with fluids and oxygen.  Currently, he is doing okay. He is looking for to the holidays.  He is still engaged. He thinks he will be getting married next year.  He's had no chest wall pain currently. He's had no cough. He's had no fever.  He's had no bleeding.  He's had no leg swelling.   Overall, his performance status is ECOG 0   Medications:  Current outpatient prescriptions:  .  ALPRAZolam (XANAX) 1 MG tablet, Take 1 tablet (1 mg total) by mouth 3 (three) times daily as needed for anxiety., Disp: 90 tablet, Rfl: 0 .  Cyanocobalamin (VITAMIN B 12 PO), Take 1 tablet by mouth., Disp: , Rfl:  .  hydroxyurea (HYDREA) 500 MG capsule, Take 2 capsules (1,000 mg total) by mouth daily. May take with food to minimize GI side effects., Disp: 60 capsule, Rfl: 2 .  Lactulose SOLN, Take 5 mLs by mouth as needed. 1 tsp. As needed, Disp: 500 mL, Rfl: 2 .  Multiple Vitamin (MULITIVITAMIN WITH MINERALS) TABS, Take 1 tablet by mouth daily., Disp: , Rfl:  .  oxyCODONE (OXYCONTIN) 80 mg 12 hr tablet, Take 1 tablet (80 mg total) by mouth every 12 (twelve) hours., Disp: 60 tablet, Rfl: 0 .  oxycodone (ROXICODONE) 30 MG immediate release tablet, Take 1 tablet (30 mg total) by mouth every 6 (six) hours as needed for pain., Disp: 120 tablet, Rfl: 0 .  traZODone  (DESYREL) 100 MG tablet, Take 1 tablet (100 mg total) by mouth at bedtime as needed for sleep., Disp: 30 tablet, Rfl: 3  Allergies:  Allergies  Allergen Reactions  . Morphine And Related Rash  . Promethazine Hcl     Has hallucination when this drug mixed with other drugs  . Morphine Hives and Itching  . Promethazine Hcl Other (See Comments)    Has hallucination when this drug mixed with other drugs    Past Medical History, Surgical history, Social history, and Family History were reviewed and updated.  Review of Systems: As above  Physical Exam:  weight is 164 lb 8 oz (74.617 kg). His oral temperature is 98.9 F (37.2 C). His blood pressure is 123/64 and his pulse is 101. His respiration is 20.   Well-developed and well-nourished African American gentleman. Head and neck exam shows no ocular or oral lesions. There is no scleral icterus. Lungs are clear. Cardiac exam regular rate and rhythm with no murmurs rubs or bruits. Abdomen is soft.  He has good bowel sounds. There is no fluid wave. There is no palpable liver or spleen tip. Back exam shows no tenderness over the spine. Extremities shows no clubbing cyanosis or edema. Skin exam no rashes or ulcerations. Neurological exam is nonfocal.  Lab Results  Component Value Date   WBC  9.6 02/17/2015   HGB 10.8* 02/17/2015   HCT 31.3* 02/17/2015   MCV 81* 02/17/2015   PLT 387 02/17/2015     Chemistry      Component Value Date/Time   NA 142 12/16/2014 1027   NA 138 10/14/2014 1112   NA 139 07/23/2014 0904   K 4.3 12/16/2014 1027   K 4.1 10/14/2014 1112   K 3.6 07/23/2014 0904   CL 109 10/14/2014 1112   CL 106 07/23/2014 0904   CO2 19* 12/16/2014 1027   CO2 19 10/14/2014 1112   CO2 26 07/23/2014 0904   BUN 12.9 12/16/2014 1027   BUN 11 10/14/2014 1112   BUN 11 07/23/2014 0904   CREATININE 1.0 12/16/2014 1027   CREATININE 0.88 10/14/2014 1112   CREATININE 1.0 07/23/2014 0904      Component Value Date/Time   CALCIUM 9.7  12/16/2014 1027   CALCIUM 9.8 10/14/2014 1112   CALCIUM 9.9 07/23/2014 0904   ALKPHOS 126 12/16/2014 1027   ALKPHOS 98 10/14/2014 1112   ALKPHOS 138* 07/23/2014 0904   AST 41* 12/16/2014 1027   AST 30 10/14/2014 1112   AST 31 07/23/2014 0904   ALT 38 12/16/2014 1027   ALT 19 10/14/2014 1112   ALT 24 07/23/2014 0904   BILITOT 1.69* 12/16/2014 1027   BILITOT 1.8* 10/14/2014 1112   BILITOT 1.30 07/23/2014 0904        Impression and Plan: [name] is 34 year old gentleman with hemoglobin SS disease. He looks quite good. I feel bad that he was hospitalized at [facility] back in October.  Overall, he is done pretty good this year. I think he was hospitalized twice.  I'll plan to get him back in 2 months. I did go ahead and refill his prescriptions.    Josph Macho, MD 11/10/20161:16 PM

## 2015-02-18 LAB — IRON AND TIBC CHCC
%SAT: 11 % — ABNORMAL LOW (ref 20–55)
Iron: 43 ug/dL (ref 42–163)
TIBC: 374 ug/dL (ref 202–409)
UIBC: 331 ug/dL (ref 117–376)

## 2015-02-18 LAB — FERRITIN CHCC: Ferritin: 54 ng/ml (ref 22–316)

## 2015-02-21 LAB — HEMOGLOBINOPATHY EVALUATION
Hemoglobin Other: 0 %
Hgb A2 Quant: 3 % (ref 2.2–3.2)
Hgb A: 0 % — ABNORMAL LOW (ref 96.8–97.8)
Hgb F Quant: 11.5 % — ABNORMAL HIGH (ref 0.0–2.0)
Hgb S Quant: 85.5 % — ABNORMAL HIGH

## 2015-02-21 LAB — RETICULOCYTES (CHCC)
ABS RETIC: 420.7 10*3/uL — AB (ref 19.0–186.0)
RBC.: 3.86 MIL/uL — AB (ref 4.22–5.81)
RETIC CT PCT: 10.9 % — AB (ref 0.4–2.3)

## 2015-03-30 ENCOUNTER — Ambulatory Visit
Admission: EM | Admit: 2015-03-30 | Discharge: 2015-03-30 | Disposition: A | Discharge status: Home / Self Care | Payer: Medicare Other | Attending: Family Medicine | Admitting: Family Medicine

## 2015-03-30 ENCOUNTER — Encounter: Payer: Self-pay | Admitting: Emergency Medicine

## 2015-03-30 ENCOUNTER — Ambulatory Visit
Admission: EM | Admit: 2015-03-30 | Discharge: 2015-03-30 | Discharge status: Absent without leave | Payer: Medicare Other

## 2015-03-30 ENCOUNTER — Telehealth: Payer: Self-pay | Admitting: Hematology & Oncology

## 2015-03-30 DIAGNOSIS — J0110 Acute frontal sinusitis, unspecified: Secondary | ICD-10-CM

## 2015-03-30 MED ORDER — BENZONATATE 100 MG PO CAPS: 100.0000 mg | ORAL_CAPSULE | Freq: Three times a day (TID) | ORAL | Status: DC | PRN

## 2015-03-30 MED ORDER — AMOXICILLIN-POT CLAVULANATE 875-125 MG PO TABS: 1.0000 | ORAL_TABLET | Freq: Two times a day (BID) | ORAL | Status: DC

## 2015-03-30 NOTE — Telephone Encounter (Signed)
MEDICARE PART D  - (oral meds) ID: [id number]    P: [phone]

## 2015-03-30 NOTE — ED Provider Notes (Signed)
[facility]  ____________________________________________  Time seen: Approximately 2:29 PM  I have reviewed the triage vital signs and the nursing notes.   HISTORY  Chief Complaint URI and Cough  HPI [name] is a 34 y.o. male presents with a complaint of one week of runny nose, nasal congestion and sinus pressure. Reports intermittent cough. Patient reports that cough is primarily at night and he can feel the drainage in the back of his throat. Patient states that he was being able to blow nose and get thick greenish drainage however states last 2 days, feels like he is not able to produce the mucus and it feels clogged in sinuses. Patient reports current sinus discomfort is 4 out of 10 aching and pressure. Denies pain radiation. Patient does report that he has been trying multiple over-the-counter cough and congestion medicines including Mucinex without any improvement. Patient denies known fever but reports he did feel warm one day. Reports continues to eat and drink well.  Denies chest pain, shortness of breath, abdominal pain, neck pain, back pain, leg swelling, vomiting, nausea, diarrhea, constipation or joint pain.Denies weakness, dizziness, or rash.   PCP: Dr. Candida PeelingMcKinzie.   Past Medical History  Diagnosis Date  . Sickle cell anemia (HCC)    Patient reports last sickle cell crisis was in September.  Patient Active Problem List   Diagnosis Date Noted  . Acute respiratory failure (HCC) 12/27/2014  . Systemic inflammatory response syndrome (SIRS) (HCC) 12/27/2014  . Chronic pain 01/26/2014  . Anxiety disorder 02/06/2013  . 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 Avascular bone necrosis (HCC) 01/27/2013  . Fever 01/24/2013  . Encephalopathy, toxic 01/24/2013  . Acute respiratory failure with hypoxia (HCC) 01/23/2013  . Acute delirium 02/06/2012  . Dehydration 02/04/2012  . Constipation 09/13/2011  . Leukocytosis 09/11/2011  . Sickle cell anemia with  crisis (HCC) 04/16/2011  . Pruritus 04/16/2011    Past Surgical History  Procedure Laterality Date  . Cholecystectomy      Current Outpatient Rx  Name  Route  Sig  Dispense  Refill  . ALPRAZolam (XANAX) 1 MG tablet   Oral   Take 1 tablet (1 mg total) by mouth 3 (three) times daily as needed for anxiety.   90 tablet   0   . Cyanocobalamin (VITAMIN B 12 PO)   Oral   Take 1 tablet by mouth.         . hydroxyurea (HYDREA) 500 MG capsule   Oral   Take 2 capsules (1,000 mg total) by mouth daily. May take with food to minimize GI side effects.   60 capsule   2   . Lactulose SOLN   Oral   Take 5 mLs by mouth as needed. 1 tsp. As needed   500 mL   2   . Multiple Vitamin (MULITIVITAMIN WITH MINERALS) TABS   Oral   Take 1 tablet by mouth daily.         .           . oxycodone (ROXICODONE) 30 MG immediate release tablet   Oral   Take 1 tablet (30 mg total) by mouth every 6 (six) hours as needed for pain.   120 tablet   0   . traZODone (DESYREL) 100 MG tablet   Oral   Take 1 tablet (100 mg total) by mouth at bedtime as needed for sleep.   30 tablet   3     Allergies Morphine and related;  Promethazine hcl; Morphine; and Promethazine hcl  Family History  Problem Relation Age of Onset  . Sickle cell trait      Social History Social History  Substance Use Topics  . Smoking status: Never Smoker   . Smokeless tobacco: Never Used     Comment: never used tobacco  . Alcohol Use: 0.0 oz/week    0 Standard drinks or equivalent per week     Comment: occassionally    Review of Systems Constitutional: No fever/chills. Felt warm one day. Eyes: No visual changes. ENT: No sore throat. Positive runny nose, nasal congestion, sinus pressure and intermittent cough. Cardiovascular: Denies chest pain. Respiratory: Denies shortness of breath. Gastrointestinal: No abdominal pain.  No nausea, no vomiting.  No diarrhea.  No constipation. Genitourinary: Negative for  dysuria. Musculoskeletal: Negative for back pain. Skin: Negative for rash. Neurological: Negative for headaches, focal weakness or numbness.  10-point ROS otherwise negative.  ____________________________________________   PHYSICAL EXAM:  VITAL SIGNS: ED Triage Vitals  Enc Vitals Group     BP 03/30/15 1357 129/65 mmHg     Pulse Rate 03/30/15 1357 76     Resp 03/30/15 1357 18     Temp 03/30/15 1357 98.3 F (36.8 C)     Temp Source 03/30/15 1357 Oral     SpO2 03/30/15 1357 98 %     Weight 03/30/15 1357 170 lb (77.111 kg)     Height 03/30/15 1357  (1.676 m)     Head Cir --      Peak Flow --      Pain Score 03/30/15 1400 5     Pain Loc --      Pain Edu? --      Excl. in GC? --     Constitutional: Alert and oriented. Well appearing and in no acute distress. Eyes: Conjunctivae are normal. PERRL. EOMI. Head: Atraumatic. No maxillary sinus tenderness to palpation, moderate frontal sinus tenderness to palpation bilaterally. No swelling. No erythema.  Ears: no erythema, normal TMs bilaterally.   Nose: Nasal congestion with bilateral nasal turbinate erythema and edema.  Mouth/Throat: Mucous membranes are moist.  Oropharynx non-erythematous. No tonsillar swelling or exudate. Neck: No stridor.  No cervical spine tenderness to palpation. Hematological/Lymphatic/Immunilogical: No cervical lymphadenopathy. Cardiovascular: Normal rate, regular rhythm. Grossly normal heart sounds.  Good peripheral circulation. Respiratory: Normal respiratory effort.  No retractions. Lungs CTAB. No wheezes, rales or rhonchi. Good air movement. Gastrointestinal: Soft and nontender. No distention. Normal Bowel sounds. No CVA tenderness. Musculoskeletal: No lower or upper extremity tenderness nor edema. Bilateral pedal pulses equal and easily palpated. No cervical, thoracic or lumbar tenderness to palpation. Neurologic:  Normal speech and language. No gross focal neurologic deficits are appreciated. No  gait instability. Skin:  Skin is warm, dry and intact. No rash noted. Psychiatric: Mood and affect are normal. Speech and behavior are normal.  ____________________________________________   LABS (all labs ordered are listed, but only abnormal results are displayed)  Labs Reviewed - No data to display ____________________________________________   INITIAL IMPRESSION / ASSESSMENT AND PLAN / ED COURSE  Pertinent labs & imaging results that were available during my care of the patient were reviewed by me and considered in my medical decision making (see chart for details).  Very well-appearing patient. No acute distress. Presents for the complaints of 1 week of runny nose, nasal congestion, sinus pressure as well as intermittent cough. Reports unchanged by with over-the-counter medications. Lungs clear throughout. Abdomen soft and nontender. Afebrile. Vital signs  stable.Frontal sinus tenderness to palpation as well with nasal congestion nasal turbinate erythema and edema. Suspect frontal sinusitis. Will treat with oral Augmentin and when necessary Tessalon Perles. Encouraged rest, fluids, closely monitoring symptoms. Discussed follow up with Primary care physician this week. Discussed follow up and return parameters including no resolution or any worsening concerns. Patient verbalized understanding and agreed to plan.   ____________________________________________   FINAL CLINICAL IMPRESSION(S) / ED DIAGNOSES  Final diagnoses:  Acute frontal sinusitis, recurrence not specified       Renford Dills, NP 03/30/15 1503

## 2015-03-30 NOTE — ED Notes (Signed)
Cold and cough symptoms since Thursday last week. Subjective fever.

## 2015-04-05 ENCOUNTER — Telehealth: Payer: Self-pay

## 2015-04-05 NOTE — Telephone Encounter (Signed)
Received fax from [employer] to inform our office that our request for pt to receive OxyContin 80mg  60 tabs/month has been approved through 03/29/2016. Copy of this letter mailed to pt's home address. dph

## 2015-04-14 ENCOUNTER — Other Ambulatory Visit: Payer: Self-pay | Admitting: *Deleted

## 2015-04-14 DIAGNOSIS — F064 Anxiety disorder due to known physiological condition: Secondary | ICD-10-CM

## 2015-04-14 DIAGNOSIS — D5700 Hb-SS disease with crisis, unspecified: Secondary | ICD-10-CM

## 2015-04-14 DIAGNOSIS — G4701 Insomnia due to medical condition: Secondary | ICD-10-CM

## 2015-04-14 DIAGNOSIS — Z0000 Encounter for general adult medical examination without abnormal findings: Secondary | ICD-10-CM

## 2015-04-15 ENCOUNTER — Other Ambulatory Visit: Payer: Self-pay | Admitting: *Deleted

## 2015-04-15 DIAGNOSIS — D5700 Hb-SS disease with crisis, unspecified: Secondary | ICD-10-CM

## 2015-04-15 DIAGNOSIS — G4701 Insomnia due to medical condition: Secondary | ICD-10-CM

## 2015-04-15 DIAGNOSIS — Z0000 Encounter for general adult medical examination without abnormal findings: Secondary | ICD-10-CM

## 2015-04-15 DIAGNOSIS — F064 Anxiety disorder due to known physiological condition: Secondary | ICD-10-CM

## 2015-04-15 MED ORDER — OXYCODONE HCL ER 80 MG PO T12A: 80.0000 mg | EXTENDED_RELEASE_TABLET | Freq: Two times a day (BID) | ORAL | Status: DC

## 2015-04-15 MED ORDER — OXYCODONE HCL 30 MG PO TABS: 30.0000 mg | ORAL_TABLET | Freq: Four times a day (QID) | ORAL | Status: DC | PRN

## 2015-04-15 MED ORDER — ALPRAZOLAM 1 MG PO TABS: 1.0000 mg | ORAL_TABLET | Freq: Three times a day (TID) | ORAL | Status: DC | PRN

## 2015-04-15 MED FILL — ALPRAZolam 1 MG TABS: 1 | 30 days supply | Qty: 90 | Fill #0

## 2015-04-15 MED FILL — oxyCODONE HCL 30 MG TABS: 30 | 30 days supply | Qty: 120 | Fill #0

## 2015-04-19 ENCOUNTER — Telehealth: Payer: Self-pay | Admitting: Hematology & Oncology

## 2015-04-19 NOTE — Telephone Encounter (Signed)
[employer] has APPROVED the OXYCONTIN 80 MG TAB and is good until 04/14/2016.    Event: [id number]     P: [phone]     COPY SCANNED

## 2015-04-21 ENCOUNTER — Other Ambulatory Visit (HOSPITAL_BASED_OUTPATIENT_CLINIC_OR_DEPARTMENT_OTHER): Payer: Medicare Other

## 2015-04-21 ENCOUNTER — Ambulatory Visit: Payer: Medicare Other

## 2015-04-21 ENCOUNTER — Encounter: Payer: Self-pay | Admitting: Hematology & Oncology

## 2015-04-21 ENCOUNTER — Ambulatory Visit (HOSPITAL_BASED_OUTPATIENT_CLINIC_OR_DEPARTMENT_OTHER): Payer: Medicare Other | Admitting: Hematology & Oncology

## 2015-04-21 VITALS — BP 120/66 | HR 73 | Temp 98.6°F | Resp 16 | Ht 66.0 in | Wt 171.0 lb

## 2015-04-21 DIAGNOSIS — G4701 Insomnia due to medical condition: Secondary | ICD-10-CM

## 2015-04-21 DIAGNOSIS — D5700 Hb-SS disease with crisis, unspecified: Secondary | ICD-10-CM | POA: Diagnosis not present

## 2015-04-21 DIAGNOSIS — Z0000 Encounter for general adult medical examination without abnormal findings: Secondary | ICD-10-CM | POA: Diagnosis not present

## 2015-04-21 DIAGNOSIS — F064 Anxiety disorder due to known physiological condition: Secondary | ICD-10-CM

## 2015-04-21 DIAGNOSIS — F418 Other specified anxiety disorders: Secondary | ICD-10-CM | POA: Diagnosis not present

## 2015-04-21 DIAGNOSIS — D571 Sickle-cell disease without crisis: Secondary | ICD-10-CM | POA: Diagnosis not present

## 2015-04-21 LAB — CBC WITH DIFFERENTIAL (CANCER CENTER ONLY)
BASO#: 0.1 10*3/uL (ref 0.0–0.2)
BASO%: 0.7 % (ref 0.0–2.0)
EOS%: 1.5 % (ref 0.0–7.0)
Eosinophils Absolute: 0.1 10*3/uL (ref 0.0–0.5)
HEMATOCRIT: 29.6 % — AB (ref 38.7–49.9)
HEMOGLOBIN: 10.3 g/dL — AB (ref 13.0–17.1)
LYMPH#: 2.1 10*3/uL (ref 0.9–3.3)
LYMPH%: 25.4 % (ref 14.0–48.0)
MCH: 27 pg — ABNORMAL LOW (ref 28.0–33.4)
MCHC: 34.8 g/dL (ref 32.0–35.9)
MCV: 78 fL — ABNORMAL LOW (ref 82–98)
MONO#: 1 10*3/uL — ABNORMAL HIGH (ref 0.1–0.9)
MONO%: 12.2 % (ref 0.0–13.0)
NEUT%: 60.2 % (ref 40.0–80.0)
NEUTROS ABS: 5.1 10*3/uL (ref 1.5–6.5)
Platelets: 380 10*3/uL (ref 145–400)
RBC: 3.82 10*6/uL — ABNORMAL LOW (ref 4.20–5.70)
RDW: 25.1 % — AB (ref 11.1–15.7)
WBC: 8.4 10*3/uL (ref 4.0–10.0)

## 2015-04-21 LAB — COMPREHENSIVE METABOLIC PANEL
ALT: 22 U/L (ref 0–55)
ANION GAP: 6 meq/L (ref 3–11)
AST: 39 U/L — ABNORMAL HIGH (ref 5–34)
Albumin: 4.1 g/dL (ref 3.5–5.0)
Alkaline Phosphatase: 114 U/L (ref 40–150)
BILIRUBIN TOTAL: 1.79 mg/dL — AB (ref 0.20–1.20)
BUN: 10.1 mg/dL (ref 7.0–26.0)
CHLORIDE: 111 meq/L — AB (ref 98–109)
CO2: 24 meq/L (ref 22–29)
Calcium: 9.5 mg/dL (ref 8.4–10.4)
Creatinine: 0.9 mg/dL (ref 0.7–1.3)
Glucose: 94 mg/dl (ref 70–140)
Potassium: 3.8 mEq/L (ref 3.5–5.1)
Sodium: 142 mEq/L (ref 136–145)
TOTAL PROTEIN: 8 g/dL (ref 6.4–8.3)

## 2015-04-21 LAB — TECHNOLOGIST REVIEW CHCC SATELLITE

## 2015-04-21 MED ORDER — ALPRAZOLAM 1 MG PO TABS: 1.0000 mg | ORAL_TABLET | Freq: Three times a day (TID) | ORAL | Status: DC | PRN

## 2015-04-21 MED ORDER — OXYCODONE HCL ER 80 MG PO T12A: 80.0000 mg | EXTENDED_RELEASE_TABLET | Freq: Two times a day (BID) | ORAL | Status: DC

## 2015-04-21 MED ORDER — OXYCODONE HCL 30 MG PO TABS: 30.0000 mg | ORAL_TABLET | Freq: Four times a day (QID) | ORAL | Status: DC | PRN

## 2015-04-21 NOTE — Progress Notes (Signed)
No Phlebotomy today per Dr Ennever 

## 2015-04-21 NOTE — Progress Notes (Signed)
Hematology and Oncology Follow Up Visit  BREANDAN PEOPLE [id number] 1980/07/23 35 y.o. 04/21/2015   Principle Diagnosis:   Hemoglobin SS disease  Current Therapy:    Folic acid 1 mg by mouth daily  Hydrea 500 mg by mouth twice a day  Phlebotomy to maintain his hemoglobin below 11.     Interim History:  Mr.  Schopf is back for followup. He is doing okay. He got to the cause days without any problem. Had a very nice Christmas and New Year's. He's watching a lot of sports. As always, we talked about the playoffs.  He's had no problems with crisis. He's not been hospitalized. He's had no fever. He's had no rashes. He's had no cough. He's had no fever. He's had no headache.  He's had no change in bowel or bladder habits.  He's had no joint swelling or pain.   So far, iron overload has not her pump for him. Back in November, his ferritin was only 54 with iron saturation of 11%.  He continues to have a high hemoglobin F level. Back in November he had 11.5% hemoglobin F. I did this is quite protective for him. Maybe the Hydrea that he is taking is helping this.  Overall, his performance status is ECOG 0   Medications:  Current outpatient prescriptions:  .  ALPRAZolam (XANAX) 1 MG tablet, Take 1 tablet (1 mg total) by mouth 3 (three) times daily as needed for anxiety., Disp: 90 tablet, Rfl: 0 .  amoxicillin-clavulanate (AUGMENTIN) 875-125 MG tablet, Take 1 tablet by mouth every 12 (twelve) hours., Disp: 20 tablet, Rfl: 0 .  benzonatate (TESSALON PERLES) 100 MG capsule, Take 1 capsule (100 mg total) by mouth 3 (three) times daily as needed for cough., Disp: 15 capsule, Rfl: 0 .  Cyanocobalamin (VITAMIN B 12 PO), Take 1 tablet by mouth., Disp: , Rfl:  .  hydroxyurea (HYDREA) 500 MG capsule, Take 2 capsules (1,000 mg total) by mouth daily. May take with food to minimize GI side effects., Disp: 60 capsule, Rfl: 2 .  Lactulose SOLN, Take 5 mLs by mouth as needed. 1 tsp. As needed, Disp: 500  mL, Rfl: 2 .  Multiple Vitamin (MULITIVITAMIN WITH MINERALS) TABS, Take 1 tablet by mouth daily., Disp: , Rfl:  .  oxyCODONE (OXYCONTIN) 80 mg 12 hr tablet, Take 1 tablet (80 mg total) by mouth every 12 (twelve) hours., Disp: 60 tablet, Rfl: 0 .  oxycodone (ROXICODONE) 30 MG immediate release tablet, Take 1 tablet (30 mg total) by mouth every 6 (six) hours as needed for pain., Disp: 120 tablet, Rfl: 0 .  traZODone (DESYREL) 100 MG tablet, Take 1 tablet (100 mg total) by mouth at bedtime as needed for sleep., Disp: 30 tablet, Rfl: 3  Allergies:  Allergies  Allergen Reactions  . Morphine And Related Rash  . Promethazine Hcl     Has hallucination when this drug mixed with other drugs  . Morphine Hives and Itching  . Promethazine Hcl Other (See Comments)    Has hallucination when this drug mixed with other drugs    Past Medical History, Surgical history, Social history, and Family History were reviewed and updated.  Review of Systems: As above  Physical Exam:  height is 5\' 6"  (1.676 m) and weight is 171 lb (77.565 kg). His oral temperature is 98.6 F (37 C). His blood pressure is 120/66 and his pulse is 73. His respiration is 16.   Well-developed and well-nourished African American gentleman. Head and  neck exam shows no ocular or oral lesions. There is no scleral icterus. Lungs are clear. Cardiac exam regular rate and rhythm with no murmurs rubs or bruits. Abdomen is soft.  He has good bowel sounds. There is no fluid wave. There is no palpable liver or spleen tip. Back exam shows no tenderness over the spine. Extremities shows no clubbing cyanosis or edema. Skin exam no rashes or ulcerations. Neurological exam is nonfocal.  Lab Results  Component Value Date   WBC 8.4 04/21/2015   HGB 10.3* 04/21/2015   HCT 29.6* 04/21/2015   MCV 78* 04/21/2015   PLT 380 04/21/2015     Chemistry      Component Value Date/Time   NA 138 02/17/2015 1155   NA 138 10/14/2014 1112   NA 139 07/23/2014  0904   K 3.7 02/17/2015 1155   K 4.1 10/14/2014 1112   K 3.6 07/23/2014 0904   CL 109 10/14/2014 1112   CL 106 07/23/2014 0904   CO2 21* 02/17/2015 1155   CO2 19 10/14/2014 1112   CO2 26 07/23/2014 0904   BUN 9.3 02/17/2015 1155   BUN 11 10/14/2014 1112   BUN 11 07/23/2014 0904   CREATININE 0.9 02/17/2015 1155   CREATININE 0.88 10/14/2014 1112   CREATININE 1.0 07/23/2014 0904      Component Value Date/Time   CALCIUM 9.8 02/17/2015 1155   CALCIUM 9.8 10/14/2014 1112   CALCIUM 9.9 07/23/2014 0904   ALKPHOS 94 02/17/2015 1155   ALKPHOS 98 10/14/2014 1112   ALKPHOS 138* 07/23/2014 0904   AST 43* 02/17/2015 1155   AST 30 10/14/2014 1112   AST 31 07/23/2014 0904   ALT 32 02/17/2015 1155   ALT 19 10/14/2014 1112   ALT 24 07/23/2014 0904   BILITOT 2.18* 02/17/2015 1155   BILITOT 1.8* 10/14/2014 1112   BILITOT 1.30 07/23/2014 0904        Impression and Plan: [name] is 35 year old gentleman with hemoglobin SS disease. He looks quite good.   He does not be phlebotomized.  I'm glad that he had no problems over the holidays.   I did go ahead and refill his prescriptions. I just do not want to see him come back to pick prescriptions of all the way from [place].   We will go ahead and plan to get him back in 3 months now. did go ahead and refill his prescriptions.    Josph MachoENNEVER,PETER R, MD 1/12/20172:35 PM

## 2015-04-22 LAB — IRON AND TIBC
%SAT: 12 % — ABNORMAL LOW (ref 20–55)
Iron: 49 ug/dL (ref 42–163)
TIBC: 401 ug/dL (ref 202–409)
UIBC: 352 ug/dL (ref 117–376)

## 2015-04-22 LAB — FERRITIN: Ferritin: 35 ng/ml (ref 22–316)

## 2015-04-22 LAB — RETICULOCYTES: RETICULOCYTE COUNT: 10.1 % — AB (ref 0.6–2.6)

## 2015-04-26 LAB — HEMOGLOBINOPATHY EVALUATION
HEMOGLOBIN A2 QUANTITATION: 4.4 % — AB (ref 0.7–3.1)
HGB C: 0 %
HGB S: 87.5 % — AB
Hemoglobin F Quantitation: 8.1 % — ABNORMAL HIGH (ref 0.0–2.0)
Hgb A: 0 % — ABNORMAL LOW (ref 94.0–98.0)

## 2015-05-12 ENCOUNTER — Encounter: Payer: Self-pay | Admitting: *Deleted

## 2015-05-20 MED FILL — OxyCONTIN 80 MG T12A: 80 | 30 days supply | Qty: 60 | Fill #0

## 2015-06-15 ENCOUNTER — Other Ambulatory Visit: Payer: Self-pay | Admitting: *Deleted

## 2015-06-15 DIAGNOSIS — G4701 Insomnia due to medical condition: Secondary | ICD-10-CM

## 2015-06-15 DIAGNOSIS — Z0000 Encounter for general adult medical examination without abnormal findings: Secondary | ICD-10-CM

## 2015-06-15 DIAGNOSIS — D5700 Hb-SS disease with crisis, unspecified: Secondary | ICD-10-CM

## 2015-06-15 DIAGNOSIS — F064 Anxiety disorder due to known physiological condition: Secondary | ICD-10-CM

## 2015-06-15 MED ORDER — OXYCODONE HCL 30 MG PO TABS: 30.0000 mg | ORAL_TABLET | Freq: Four times a day (QID) | ORAL | Status: DC | PRN

## 2015-06-15 MED ORDER — ALPRAZOLAM 1 MG PO TABS: 1.0000 mg | ORAL_TABLET | Freq: Three times a day (TID) | ORAL | Status: DC | PRN

## 2015-06-15 MED ORDER — OXYCODONE HCL ER 80 MG PO T12A: 80.0000 mg | EXTENDED_RELEASE_TABLET | Freq: Two times a day (BID) | ORAL | Status: DC

## 2015-06-16 MED FILL — ALPRAZolam 1 MG TABS: 1 | 30 days supply | Qty: 90 | Fill #0

## 2015-07-13 ENCOUNTER — Other Ambulatory Visit: Payer: Self-pay | Admitting: *Deleted

## 2015-07-13 DIAGNOSIS — G4701 Insomnia due to medical condition: Secondary | ICD-10-CM

## 2015-07-13 DIAGNOSIS — D5700 Hb-SS disease with crisis, unspecified: Secondary | ICD-10-CM

## 2015-07-13 DIAGNOSIS — Z0000 Encounter for general adult medical examination without abnormal findings: Secondary | ICD-10-CM

## 2015-07-13 DIAGNOSIS — F064 Anxiety disorder due to known physiological condition: Secondary | ICD-10-CM

## 2015-07-13 MED ORDER — ALPRAZOLAM 1 MG PO TABS: 1.0000 mg | ORAL_TABLET | Freq: Three times a day (TID) | ORAL | Status: DC | PRN

## 2015-07-13 MED ORDER — OXYCODONE HCL 30 MG PO TABS
30.0000 mg | ORAL_TABLET | Freq: Four times a day (QID) | ORAL | Status: DC | PRN
Start: 2015-07-13 — End: 2015-08-18

## 2015-07-13 MED ORDER — OXYCODONE HCL ER 80 MG PO T12A
80.0000 mg | EXTENDED_RELEASE_TABLET | Freq: Two times a day (BID) | ORAL | Status: DC
Start: 2015-07-13 — End: 2015-08-18

## 2015-07-16 DIAGNOSIS — S1180XA Unspecified open wound of other specified part of neck, initial encounter: Secondary | ICD-10-CM | POA: Diagnosis not present

## 2015-07-16 DIAGNOSIS — S1093XA Contusion of unspecified part of neck, initial encounter: Secondary | ICD-10-CM | POA: Diagnosis not present

## 2015-07-16 DIAGNOSIS — S1193XA Puncture wound without foreign body of unspecified part of neck, initial encounter: Secondary | ICD-10-CM | POA: Diagnosis not present

## 2015-07-16 DIAGNOSIS — R52 Pain, unspecified: Secondary | ICD-10-CM | POA: Diagnosis not present

## 2015-07-16 DIAGNOSIS — S12500A Unspecified displaced fracture of sixth cervical vertebra, initial encounter for closed fracture: Secondary | ICD-10-CM | POA: Diagnosis not present

## 2015-07-16 DIAGNOSIS — Z043 Encounter for examination and observation following other accident: Secondary | ICD-10-CM | POA: Diagnosis not present

## 2015-07-16 DIAGNOSIS — R918 Other nonspecific abnormal finding of lung field: Secondary | ICD-10-CM | POA: Diagnosis not present

## 2015-07-16 DIAGNOSIS — F430 Acute stress reaction: Secondary | ICD-10-CM | POA: Diagnosis not present

## 2015-07-16 DIAGNOSIS — S0292XA Unspecified fracture of facial bones, initial encounter for closed fracture: Secondary | ICD-10-CM | POA: Diagnosis not present

## 2015-07-16 DIAGNOSIS — S1190XA Unspecified open wound of unspecified part of neck, initial encounter: Secondary | ICD-10-CM | POA: Diagnosis not present

## 2015-07-16 DIAGNOSIS — S12600A Unspecified displaced fracture of seventh cervical vertebra, initial encounter for closed fracture: Secondary | ICD-10-CM | POA: Diagnosis not present

## 2015-07-16 DIAGNOSIS — G8911 Acute pain due to trauma: Secondary | ICD-10-CM | POA: Diagnosis not present

## 2015-07-16 DIAGNOSIS — W3400XA Accidental discharge from unspecified firearms or gun, initial encounter: Secondary | ICD-10-CM | POA: Diagnosis not present

## 2015-07-16 DIAGNOSIS — J9601 Acute respiratory failure with hypoxia: Secondary | ICD-10-CM | POA: Diagnosis not present

## 2015-07-16 DIAGNOSIS — T794XXA Traumatic shock, initial encounter: Secondary | ICD-10-CM | POA: Diagnosis present

## 2015-07-16 DIAGNOSIS — S0101XA Laceration without foreign body of scalp, initial encounter: Secondary | ICD-10-CM | POA: Diagnosis present

## 2015-07-16 DIAGNOSIS — D571 Sickle-cell disease without crisis: Secondary | ICD-10-CM | POA: Diagnosis not present

## 2015-07-16 DIAGNOSIS — I726 Aneurysm of vertebral artery: Secondary | ICD-10-CM | POA: Diagnosis not present

## 2015-07-16 DIAGNOSIS — Z4682 Encounter for fitting and adjustment of non-vascular catheter: Secondary | ICD-10-CM | POA: Diagnosis not present

## 2015-07-16 DIAGNOSIS — S162XXA Laceration of muscle, fascia and tendon at neck level, initial encounter: Secondary | ICD-10-CM | POA: Diagnosis present

## 2015-07-16 DIAGNOSIS — I517 Cardiomegaly: Secondary | ICD-10-CM | POA: Diagnosis not present

## 2015-07-16 DIAGNOSIS — S0181XA Laceration without foreign body of other part of head, initial encounter: Secondary | ICD-10-CM | POA: Diagnosis present

## 2015-07-16 DIAGNOSIS — S12400A Unspecified displaced fracture of fifth cervical vertebra, initial encounter for closed fracture: Secondary | ICD-10-CM | POA: Diagnosis not present

## 2015-07-16 DIAGNOSIS — R41841 Cognitive communication deficit: Secondary | ICD-10-CM | POA: Diagnosis not present

## 2015-07-16 DIAGNOSIS — D62 Acute posthemorrhagic anemia: Secondary | ICD-10-CM | POA: Diagnosis present

## 2015-07-16 DIAGNOSIS — I671 Cerebral aneurysm, nonruptured: Secondary | ICD-10-CM | POA: Diagnosis not present

## 2015-07-16 DIAGNOSIS — D573 Sickle-cell trait: Secondary | ICD-10-CM | POA: Diagnosis not present

## 2015-07-16 DIAGNOSIS — S01511A Laceration without foreign body of lip, initial encounter: Secondary | ICD-10-CM | POA: Diagnosis not present

## 2015-07-16 DIAGNOSIS — S15102A Unspecified injury of left vertebral artery, initial encounter: Secondary | ICD-10-CM | POA: Diagnosis not present

## 2015-07-16 DIAGNOSIS — D689 Coagulation defect, unspecified: Secondary | ICD-10-CM | POA: Diagnosis present

## 2015-07-16 DIAGNOSIS — D500 Iron deficiency anemia secondary to blood loss (chronic): Secondary | ICD-10-CM | POA: Diagnosis not present

## 2015-07-16 DIAGNOSIS — J811 Chronic pulmonary edema: Secondary | ICD-10-CM | POA: Diagnosis not present

## 2015-07-16 DIAGNOSIS — S090XXA Injury of blood vessels of head, not elsewhere classified, initial encounter: Secondary | ICD-10-CM | POA: Diagnosis not present

## 2015-07-16 DIAGNOSIS — Z79891 Long term (current) use of opiate analgesic: Secondary | ICD-10-CM | POA: Diagnosis not present

## 2015-07-21 ENCOUNTER — Ambulatory Visit: Payer: Medicare Other | Admitting: Hematology & Oncology

## 2015-07-21 ENCOUNTER — Other Ambulatory Visit: Payer: Medicare Other

## 2015-07-28 DIAGNOSIS — F4310 Post-traumatic stress disorder, unspecified: Secondary | ICD-10-CM | POA: Diagnosis not present

## 2015-08-10 DIAGNOSIS — S1180XD Unspecified open wound of other specified part of neck, subsequent encounter: Secondary | ICD-10-CM | POA: Diagnosis not present

## 2015-08-16 ENCOUNTER — Encounter: Payer: Self-pay | Admitting: Occupational Therapy

## 2015-08-16 ENCOUNTER — Ambulatory Visit: Payer: Medicare Other | Attending: Family Medicine | Admitting: Occupational Therapy

## 2015-08-16 ENCOUNTER — Ambulatory Visit: Payer: Medicare Other | Admitting: Occupational Therapy

## 2015-08-16 DIAGNOSIS — M25612 Stiffness of left shoulder, not elsewhere classified: Secondary | ICD-10-CM | POA: Diagnosis not present

## 2015-08-16 DIAGNOSIS — M6281 Muscle weakness (generalized): Secondary | ICD-10-CM | POA: Insufficient documentation

## 2015-08-16 DIAGNOSIS — M25512 Pain in left shoulder: Secondary | ICD-10-CM | POA: Diagnosis not present

## 2015-08-16 NOTE — Patient Instructions (Addendum)
1. Grip Strengthening (Resistive Putty)   Squeeze putty using thumb and all fingers. Repeat _20___ times. Do __2__ sessions per day.   Keep doing pendulum exercises  Do the three exercises we did on your videotape (shoulder flexion and abduction to 90*, IR/ER). Start with 1 set of 10 repetitions and you will do that 2 times per day every day. Work your way up to [employer]10 reps x2, twice a day as your pain allows.   We will start more aggressive strengthening when your precautions are lifted.       Copyright  VHI. All rights reserved.
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
## 2015-08-18 ENCOUNTER — Other Ambulatory Visit: Payer: Self-pay

## 2015-08-18 DIAGNOSIS — F064 Anxiety disorder due to known physiological condition: Secondary | ICD-10-CM

## 2015-08-18 DIAGNOSIS — D5700 Hb-SS disease with crisis, unspecified: Secondary | ICD-10-CM

## 2015-08-18 DIAGNOSIS — G4701 Insomnia due to medical condition: Secondary | ICD-10-CM

## 2015-08-18 DIAGNOSIS — Z0000 Encounter for general adult medical examination without abnormal findings: Secondary | ICD-10-CM

## 2015-08-18 MED ORDER — OXYCODONE HCL ER 80 MG PO T12A
80.0000 mg | EXTENDED_RELEASE_TABLET | Freq: Two times a day (BID) | ORAL | Status: DC
Start: 2015-08-18 — End: 2015-09-21

## 2015-08-18 MED ORDER — OXYCODONE HCL 30 MG PO TABS: 30.0000 mg | ORAL_TABLET | Freq: Four times a day (QID) | ORAL | Status: DC | PRN

## 2015-08-19 ENCOUNTER — Other Ambulatory Visit: Payer: Self-pay | Admitting: *Deleted

## 2015-08-19 DIAGNOSIS — G4701 Insomnia due to medical condition: Secondary | ICD-10-CM

## 2015-08-19 DIAGNOSIS — D5700 Hb-SS disease with crisis, unspecified: Secondary | ICD-10-CM

## 2015-08-19 DIAGNOSIS — Z0000 Encounter for general adult medical examination without abnormal findings: Secondary | ICD-10-CM

## 2015-08-19 DIAGNOSIS — F064 Anxiety disorder due to known physiological condition: Secondary | ICD-10-CM

## 2015-08-19 MED ORDER — ALPRAZOLAM 1 MG PO TABS: 1.0000 mg | ORAL_TABLET | Freq: Three times a day (TID) | ORAL | Status: DC | PRN

## 2015-08-22 MED FILL — oxyCODONE HCL 30 MG TABS: 30 | 30 days supply | Qty: 120 | Fill #0

## 2015-08-22 MED FILL — ALPRAZolam 1 MG TABS: 1 | 30 days supply | Qty: 90 | Fill #0

## 2015-08-22 MED FILL — OxyCONTIN 80 MG T12A: 80 | 30 days supply | Qty: 60 | Fill #0

## 2015-08-24 DIAGNOSIS — I729 Aneurysm of unspecified site: Secondary | ICD-10-CM | POA: Diagnosis not present

## 2015-08-31 DIAGNOSIS — I729 Aneurysm of unspecified site: Secondary | ICD-10-CM | POA: Diagnosis not present

## 2015-09-01 ENCOUNTER — Ambulatory Visit: Payer: Medicare Other | Admitting: *Deleted

## 2015-09-06 ENCOUNTER — Encounter: Payer: Medicare Other | Admitting: Occupational Therapy

## 2015-09-08 DIAGNOSIS — M25512 Pain in left shoulder: Secondary | ICD-10-CM | POA: Diagnosis not present

## 2015-09-08 DIAGNOSIS — S12401D Unspecified nondisplaced fracture of fifth cervical vertebra, subsequent encounter for fracture with routine healing: Secondary | ICD-10-CM | POA: Diagnosis not present

## 2015-09-08 DIAGNOSIS — M542 Cervicalgia: Secondary | ICD-10-CM | POA: Diagnosis not present

## 2015-09-08 DIAGNOSIS — G8929 Other chronic pain: Secondary | ICD-10-CM | POA: Diagnosis not present

## 2015-09-08 DIAGNOSIS — M4322 Fusion of spine, cervical region: Secondary | ICD-10-CM | POA: Diagnosis not present

## 2015-09-12 ENCOUNTER — Encounter: Payer: Self-pay | Admitting: Occupational Therapy

## 2015-09-12 ENCOUNTER — Ambulatory Visit: Payer: Medicare Other | Attending: Family Medicine | Admitting: Occupational Therapy

## 2015-09-12 DIAGNOSIS — M25512 Pain in left shoulder: Secondary | ICD-10-CM

## 2015-09-12 DIAGNOSIS — M6281 Muscle weakness (generalized): Secondary | ICD-10-CM | POA: Diagnosis not present

## 2015-09-12 DIAGNOSIS — M25612 Stiffness of left shoulder, not elsewhere classified: Secondary | ICD-10-CM | POA: Insufficient documentation

## 2015-09-12 NOTE — Therapy (Signed)
[facility] [address] [place], [place], [address] Phone: [phone]   Fax:  [phone]  Occupational Therapy Treatment  Patient Details  Name: [name] MRN: [id number] Date of Birth: 1980-09-21 Referring Provider: Thornton Papas  Encounter Date: 09/12/2015      OT End of Session - 09/12/15 0956    Visit Number 2   Number of Visits 16   Date for OT Re-Evaluation 10/11/15   Authorization Type medicare   Authorization Time Period 60 days - pt will need G code and PN every 10 visits   Authorization - Visit Number 2   Authorization - Number of Visits 10   OT Start Time 0930   OT Stop Time 0952   OT Time Calculation (min) 22 min   Activity Tolerance Patient tolerated treatment well      Past Medical History  Diagnosis Date  . Sickle cell anemia [facility])     Past Surgical History  Procedure Laterality Date  . Cholecystectomy      There were no vitals filed for this visit.      Subjective Assessment - 09/12/15 0933    Pertinent History see care everywhere and snapshot   Currently in Pain? Yes   Pain Score 7    Pain Location Shoulder   Pain Orientation Left   Pain Descriptors / Indicators Throbbing;Shooting   Pain Type Chronic pain   Pain Onset More than a month ago   Pain Frequency Constant   Aggravating Factors  laying on it makes it worse   Pain Relieving Factors pain meds, I haven't tried any heat or ice   Multiple Pain Sites Yes   Pain Score 6   Pain Location Back  pt states this is from sickle cell and is always there.  Pt being followed by provider- see Dr. Antonieta Pert notes        TX:  Pt returns today for first visit since eval and first visit cleared of  c spine precautions (pt no longer required to wear cervical collar). Pt was issued HEP at first visit within precautions and has been compliant with HEP.  Re-eval shows: 5/5 UE strength Grip strength WFL's AROM WFL's for BUE's   Cervical AROM WFL's however painful. Ability to lift 5 pound object from overhead shelf without difficulty  Pt has met all goals except pain goals. Pt remains with significant pain in neck and L shoulder. Feel pain is likely orthopedic in nature at this point and shared findings with pt.  Pt stated that neurologist at [facility] felt the same and has referred pt to see an orthopedic specialist this week.  Recommended that pt discuss with orthopedic MD possible referral for PT (ortho) to address neck and shoulder pain. Pt in agreement with plan. Pt given address and fax and phone information for [place] if MD is in agreement. Will d/c pt today from OT.                         OT Short Term Goals - 09/12/15 0951    OT SHORT TERM GOAL #1   Title Pt will be mod I with HEP - 09/13/2015   Status Achieved   OT SHORT TERM GOAL #2   Title Pt will report no more than 6/10 pain in shoulder with functional movement during ADL's   Status Not Met   OT SHORT TERM GOAL #3   Title Pt will  increase grip strength by at least 5 pounds to assis with functional tasks (baseline=65 pounds)   Status Achieved   OT SHORT TERM GOAL #4   Title Pt will verablize understanding of cervical precautions   Status Achieved   OT SHORT TERM GOAL #5   Title Pt will demonstrate at least 110* of shoulder flexion for overhead reach activities.    Status Achieved           OT Long Term Goals - September 19, 2015 1761    OT LONG TERM GOAL #1   Title Pt will be mod I with upgraded HEP Prn- 10/11/2015   Status Achieved   OT LONG TERM GOAL #2   Title Pt will report no more than 3/10 shoulder pain LUE with functional tasks   Status Not Met   OT LONG TERM GOAL #3   Title Pt will demonstrate at least 7 pounds of increased grip strength to assist with functional tasks (baseline=65)   Status Achieved   OT LONG TERM GOAL #4   Title Pt will demonstrate at least 120* of shoulder flexion for overhead reach for  functional tasks.   Status Achieved   OT LONG TERM GOAL #5   Title Pt will be able to lift at least a 5 pound object off an overhead shelf x5 without dropping.   Status Achieved               Plan - 2015-09-19 6073    Clinical Impression Statement Pt returns today for first visit since eval.  See body of note for details - all neurological sequelae have resolved. Pt remains with neck and shoulder pain which appear to be orthopedic in nature.  Pt scheduled to see orthopedic dr at [facility] this week and will discuss referral to PT  (ortho) to address. Reviewed today's findings with pt and recommendations and pt in agreement.  D/C from OT today.   Rehab Potential Good   Clinical Impairments Affecting Rehab Potential pain, (c spine restrictions lifted)   OT Frequency 2x / week   OT Duration 8 weeks   OT Treatment/Interventions Self-care/ADL training;Moist Heat;Ultrasound;Therapeutic exercise;Neuromuscular education;DME and/or AE instruction;Passive range of motion;Manual Therapy;Therapeutic activities;Patient/family education   Plan d/c from OT    Consulted and Agree with Plan of Care Patient      Patient will benefit from skilled therapeutic intervention in order to improve the following deficits and impairments:  Decreased range of motion, Decreased strength, Impaired UE functional use, Impaired sensation, Increased muscle spasms, Pain  Visit Diagnosis: Pain in left shoulder  Stiffness of left shoulder, not elsewhere classified  Muscle weakness (generalized)      G-Codes - 09-19-2015 0956    Functional Assessment Tool Used skilled clinical observation, MMT, dynamometer   Functional Limitation Carrying, moving and handling objects   Carrying, Moving and Handling Objects Current Status (X1062) At least 1 percent but less than 20 percent impaired, limited or restricted   Carrying, Moving and Handling Objects Goal Status (I9485) At least 1 percent but less than 20 percent impaired,  limited or restricted   Carrying, Moving and Handling Objects Discharge Status [phone]) At least 1 percent but less than 20 percent impaired, limited or restricted      Problem List Patient Active Problem List   Diagnosis Date Noted  . Acute respiratory failure ([place]) 12/27/2014  . Systemic inflammatory response syndrome (SIRS) ([place]) 12/27/2014  . Chronic pain 01/26/2014  . Anxiety disorder 02/06/2013  . Hb-SS disease with crisis ([place]) 01/28/2013  .  Avascular bone necrosis ([place]) 01/27/2013  . Fever 01/24/2013  . Encephalopathy, toxic 01/24/2013  . Acute respiratory failure with hypoxia ([place]) 01/23/2013  . Acute delirium 02/06/2012  . Dehydration 02/04/2012  . Constipation 09/13/2011  . Leukocytosis 09/11/2011  . Sickle cell anemia with crisis ([place]) 04/16/2011  . Pruritus 04/16/2011    Quay Burow, OTR/L 09/12/2015, 9:57 AM  [facility] [address] [place], [place], [address] Phone: [phone]   Fax:  [phone]  Name: CAILAN GENERAL MRN: [id number] Date of Birth: [birth year]

## 2015-09-13 ENCOUNTER — Ambulatory Visit: Payer: Medicare Other | Admitting: Occupational Therapy

## 2015-09-14 DIAGNOSIS — M7542 Impingement syndrome of left shoulder: Secondary | ICD-10-CM | POA: Diagnosis not present

## 2015-09-20 ENCOUNTER — Encounter: Payer: Medicare Other | Admitting: Occupational Therapy

## 2015-09-21 ENCOUNTER — Other Ambulatory Visit: Payer: Self-pay | Admitting: *Deleted

## 2015-09-21 DIAGNOSIS — G4701 Insomnia due to medical condition: Secondary | ICD-10-CM

## 2015-09-21 DIAGNOSIS — F064 Anxiety disorder due to known physiological condition: Secondary | ICD-10-CM

## 2015-09-21 DIAGNOSIS — Z0000 Encounter for general adult medical examination without abnormal findings: Secondary | ICD-10-CM

## 2015-09-21 DIAGNOSIS — D5700 Hb-SS disease with crisis, unspecified: Secondary | ICD-10-CM

## 2015-09-21 MED ORDER — ALPRAZOLAM 1 MG PO TABS: 1.0000 mg | ORAL_TABLET | Freq: Three times a day (TID) | ORAL | Status: DC | PRN

## 2015-09-21 MED ORDER — OXYCODONE HCL ER 80 MG PO T12A: 80.0000 mg | EXTENDED_RELEASE_TABLET | Freq: Two times a day (BID) | ORAL | Status: DC

## 2015-09-21 MED ORDER — OXYCODONE HCL 30 MG PO TABS
30.0000 mg | ORAL_TABLET | Freq: Four times a day (QID) | ORAL | Status: DC | PRN
Start: 2015-09-21 — End: 2015-10-17

## 2015-09-22 ENCOUNTER — Encounter: Payer: Medicare Other | Admitting: Occupational Therapy

## 2015-09-22 MED FILL — oxyCODONE HCL 30 MG TABS: 30 | 30 days supply | Qty: 120 | Fill #0

## 2015-09-22 MED FILL — OxyCONTIN 80 MG T12A: 80 | 30 days supply | Qty: 60 | Fill #0

## 2015-09-22 MED FILL — ALPRAZolam 1 MG TABS: 1 | 30 days supply | Qty: 90 | Fill #0

## 2015-09-27 ENCOUNTER — Ambulatory Visit: Payer: Medicare Other | Admitting: Occupational Therapy

## 2015-09-27 ENCOUNTER — Encounter: Payer: Self-pay | Admitting: Physical Therapy

## 2015-09-27 ENCOUNTER — Ambulatory Visit: Payer: Medicare Other | Admitting: Physical Therapy

## 2015-09-27 DIAGNOSIS — M6281 Muscle weakness (generalized): Secondary | ICD-10-CM

## 2015-09-27 DIAGNOSIS — M25612 Stiffness of left shoulder, not elsewhere classified: Secondary | ICD-10-CM | POA: Diagnosis not present

## 2015-09-27 DIAGNOSIS — M25512 Pain in left shoulder: Secondary | ICD-10-CM | POA: Diagnosis not present

## 2015-09-27 NOTE — Addendum Note (Signed)
Addended by: Milford CageLEAMON, Jana Swartzlander L on: 09/27/2015 05:09 PM   Modules accepted: Orders
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
## 2015-09-27 NOTE — Addendum Note (Signed)
Addended by: Milford CageLEAMON, Jaramie Bastos L on: 09/27/2015 02:44 PM   Modules accepted: Orders

## 2015-09-29 ENCOUNTER — Encounter: Payer: Medicare Other | Admitting: Occupational Therapy

## 2015-10-04 ENCOUNTER — Encounter: Payer: Medicare Other | Admitting: Occupational Therapy

## 2015-10-04 DIAGNOSIS — H5213 Myopia, bilateral: Secondary | ICD-10-CM | POA: Diagnosis not present

## 2015-10-04 DIAGNOSIS — H2513 Age-related nuclear cataract, bilateral: Secondary | ICD-10-CM | POA: Diagnosis not present

## 2015-10-04 DIAGNOSIS — H52223 Regular astigmatism, bilateral: Secondary | ICD-10-CM | POA: Diagnosis not present

## 2015-10-04 DIAGNOSIS — D571 Sickle-cell disease without crisis: Secondary | ICD-10-CM | POA: Diagnosis not present

## 2015-10-06 ENCOUNTER — Encounter: Payer: Medicare Other | Admitting: Occupational Therapy

## 2015-10-10 ENCOUNTER — Encounter: Payer: Medicare Other | Admitting: Occupational Therapy

## 2015-10-13 ENCOUNTER — Encounter: Payer: Medicare Other | Admitting: Occupational Therapy

## 2015-10-13 ENCOUNTER — Ambulatory Visit: Payer: Medicare Other | Attending: Family Medicine | Admitting: Physical Therapy

## 2015-10-13 DIAGNOSIS — M6281 Muscle weakness (generalized): Secondary | ICD-10-CM | POA: Insufficient documentation

## 2015-10-13 DIAGNOSIS — M25612 Stiffness of left shoulder, not elsewhere classified: Secondary | ICD-10-CM | POA: Diagnosis not present

## 2015-10-13 DIAGNOSIS — M25512 Pain in left shoulder: Secondary | ICD-10-CM | POA: Diagnosis not present

## 2015-10-13 NOTE — Patient Instructions (Addendum)
Flexibility: Corner Stretch     Standing in corner with hands just above shoulder level and feet __12__ inches from corner, lean forward until a comfortable stretch is felt across chest. Hold __30__ seconds. Repeat __3__ times per set. Do __1__ sets per session. Do __2__ sessions per day.  Posterior Capsule-Adduction Stretch (Active)    Stand or sit. Reach one arm across chest. USE OTHER ARM TO PULL Hold _30__ seconds.  Repeat __3_ times per session. Do _2__ sessions per day.  Hug, Standing    Stand, hands grasping behind opposite shoulders. Reach as far around back as possible. Hold __30_ seconds. Repeat _3_ times per session. Do __2_ sessions per day. OR MAY LEAN AWAY FROM STURDY OBJECT   Trigger Point Dry Needling  . What is Trigger Point Dry Needling (DN)? o DN is a physical therapy technique used to treat muscle pain and dysfunction. Specifically, DN helps deactivate muscle trigger points (muscle knots).  o A thin filiform needle is used to penetrate the skin and stimulate the underlying trigger point. The goal is for a local twitch response (LTR) to occur and for the trigger point to relax. No medication of any kind is injected during the procedure.   . What Does Trigger Point Dry Needling Feel Like?  o The procedure feels different for each individual patient. Some patients report that they do not actually feel the needle enter the skin and overall the process is not painful. Very mild bleeding may occur. However, many patients feel a deep cramping in the muscle in which the needle was inserted. This is the local twitch response.   Marland Kitchen. How Will I feel after the treatment? o Soreness is normal, and the onset of soreness may not occur for a few hours. Typically this soreness does not last longer than two days.  o Bruising is uncommon, however; ice can be used to decrease any possible bruising.  o In rare cases feeling tired or nauseous after the treatment is normal. In addition,  your symptoms may get worse before they get better, this period will typically not last longer than 24 hours.   . What Can I do After My Treatment? o Increase your hydration by drinking more water for the next 24 hours. o You may place ice or heat on the areas treated that have become sore, however, do not use heat on inflamed or bruised areas. Heat often brings more relief post needling. o You can continue your regular activities, but vigorous activity is not recommended initially after the treatment for 24 hours. o DN is best combined with other physical therapy such as strengthening, stretching, and other therapies.

## 2015-10-13 NOTE — Therapy (Signed)
[facility] [address] [place], [place], [address] Phone: [phone]   Fax:  [phone]  Physical Therapy Treatment  Patient Details  Name: [name] PleasureYaw N Kreeger MRN: [id number] Date of Birth: 1980/05/19 Referring Provider: Magdalene Patriciayan Leonard Freeman, MD ([facility])  Encounter Date: 10/13/2015      PT End of Session - 10/13/15 1105    Visit Number 2   Number of Visits 8   Date for PT Re-Evaluation 11/22/15   Authorization Type Medicare: Kx mod by 15th visit, and progres by the 10th visit.    PT Start Time 1015   PT Stop Time 1115   PT Time Calculation (min) 60 min      Past Medical History  Diagnosis Date  . Sickle cell anemia [facility](HCC)     Past Surgical History  Procedure Laterality Date  . Cholecystectomy      There were no vitals filed for this visit.      Subjective Assessment - 10/13/15 1017    Subjective I am about the same.    Currently in Pain? Yes   Pain Score 7    Pain Location Shoulder  and neck   Pain Orientation Left   Pain Descriptors / Indicators Throbbing   Aggravating Factors  laying on left side, reaching up sometimes   Pain Relieving Factors nothing really                         [facility] Adult PT Treatment/Exercise - 10/13/15 0001    Shoulder Exercises: Standing   External Rotation 20 reps;Theraband   Theraband Level (Shoulder External Rotation) Level 3 (Green)   Internal Rotation 20 reps;Theraband   Theraband Level (Shoulder Internal Rotation) Level 3 (Green)   Extension 20 reps;Theraband   Row 20 reps;Theraband   Modalities   Modalities Moist Heat   Moist Heat Therapy   Number Minutes Moist Heat 15 Minutes   Moist Heat Location Cervical;Shoulder   Manual Therapy   Manual Therapy Soft tissue mobilization   Soft tissue mobilization soft tissue work and trigger point release to left pec, upper trap, supraspinatus, levator scap   Neck Exercises: Stretches   Upper Trapezius  Stretch 3 reps;30 seconds   Corner Stretch 3 reps;30 seconds   Other Neck Stretches cross body stretch 3 x 30   Other Neck Stretches rhomboid stretch                PT Education - 10/13/15 1139    Education provided Yes   Education Details shoulder stretches   Person(s) Educated Patient   Methods Explanation;Handout   Comprehension Verbalized understanding          PT Short Term Goals - 10/13/15 1145    PT SHORT TERM GOAL #1   Title pt will be I with inital HEP (10/27/2015)   Time 4   Period Weeks   Status Achieved   PT SHORT TERM GOAL #2   Title pt will be able to verbalize and demonstrate proper posture and lifting/ carrying mechanics to prevent and reduce neck and shoulder pain (10/27/2015)   Time 4   Status On-going   PT SHORT TERM GOAL #3   Title pt will demonstrate decreased upper trap spasm and surround muscle tightness to decrease pain to </= 4/10 pain and help promote proper scapulohumeral rhythm (10/27/2015)   Time 4   Period Weeks   Status On-going  PT Long Term Goals - 09/27/15 1143    PT LONG TERM GOAL #1   Title pt will be I with all HEP as of last visit (11/22/15)   Time 8   Period Weeks   Status New   PT LONG TERM GOAL #2   Title pt will improve L shoulder abduction to >/= 140 degrees to promote functional lifting and carrying activities (11/22/2015)   Time 8   Period Weeks   Status New   PT LONG TERM GOAL #3   Title pt will improve L shoulder strength to >/= 4+/5 in all planes to assist with functional lifting and carrying activities with </= 2/10 pain (11/22/2015)   Time 8   Period Weeks   Status New   PT LONG TERM GOAL #4   Title pt will be able to lift and carry >/= 8# at shoulder height and/or higher with </= 2/10 pain to assist with reaching overhead shelfs and function lifting and lowering activities (11/22/2015)   Time 8   Period Weeks   Status New   PT LONG TERM GOAL #5   Title At discharge pt will improve his FOTO score  to >/= 66 to demonstrate improvement in function (11/22/2015)   Time 8   Period Weeks   Status New               Plan - 10/13/15 1106    Clinical Impression Statement Pt is independent with initial HEP. He reports fatigue but no increased pain with HEP. Added pec, rhomboid and cross body stretch to HEP. Spent most time with manual soft tissue work. Numerous triger points which may benefit from dry needling. Pt is receptive to having dry needling. HMP following manual with pt reporting pain levels about the same post treatment.    PT Next Visit Plan review stretches, add scalenes stretch, scap stab strength, dry needle      Patient will benefit from skilled therapeutic intervention in order to improve the following deficits and impairments:  Pain, Improper body mechanics, Postural dysfunction, Decreased endurance, Hypomobility, Decreased strength, Decreased mobility, Increased fascial restricitons, Impaired UE functional use, Decreased activity tolerance, Decreased range of motion  Visit Diagnosis: Pain in left shoulder  Stiffness of left shoulder, not elsewhere classified  Muscle weakness (generalized)     Problem List Patient Active Problem List   Diagnosis Date Noted  . Acute respiratory failure (HCC) 12/27/2014  . Systemic inflammatory response syndrome (SIRS) (HCC) 12/27/2014  . Chronic pain 01/26/2014  . Anxiety disorder 02/06/2013  . 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 Avascular bone necrosis (HCC) 01/27/2013  . Fever 01/24/2013  . Encephalopathy, toxic 01/24/2013  . Acute respiratory failure with hypoxia (HCC) 01/23/2013  . Acute delirium 02/06/2012  . Dehydration 02/04/2012  . Constipation 09/13/2011  . Leukocytosis 09/11/2011  . Sickle cell anemia with crisis (HCC) 04/16/2011  . Pruritus 04/16/2011    Sherrie Mustacheonoho, Torsten Weniger McGee, PTA 10/13/2015, 11:45 AM  [facility] [address] [place], [place],  [address] Phone: [phone]   Fax:  [phone]  Name: [name] MRN: [id number] Date of Birth: 05/22/1980

## 2015-10-17 ENCOUNTER — Ambulatory Visit: Payer: Medicare Other | Admitting: Family

## 2015-10-17 ENCOUNTER — Other Ambulatory Visit: Payer: Self-pay | Admitting: Nurse Practitioner

## 2015-10-17 ENCOUNTER — Other Ambulatory Visit: Payer: Medicare Other

## 2015-10-17 ENCOUNTER — Other Ambulatory Visit (HOSPITAL_BASED_OUTPATIENT_CLINIC_OR_DEPARTMENT_OTHER): Payer: Medicare Other

## 2015-10-17 ENCOUNTER — Encounter: Payer: Self-pay | Admitting: Family

## 2015-10-17 ENCOUNTER — Ambulatory Visit (HOSPITAL_BASED_OUTPATIENT_CLINIC_OR_DEPARTMENT_OTHER): Payer: Medicare Other | Admitting: Family

## 2015-10-17 VITALS — BP 134/60 | HR 75 | Temp 98.5°F | Resp 16 | Ht 66.0 in | Wt 172.0 lb

## 2015-10-17 DIAGNOSIS — D571 Sickle-cell disease without crisis: Secondary | ICD-10-CM

## 2015-10-17 DIAGNOSIS — F418 Other specified anxiety disorders: Secondary | ICD-10-CM | POA: Diagnosis not present

## 2015-10-17 DIAGNOSIS — Z0000 Encounter for general adult medical examination without abnormal findings: Secondary | ICD-10-CM | POA: Diagnosis not present

## 2015-10-17 DIAGNOSIS — G4701 Insomnia due to medical condition: Secondary | ICD-10-CM

## 2015-10-17 DIAGNOSIS — D5700 Hb-SS disease with crisis, unspecified: Secondary | ICD-10-CM | POA: Diagnosis not present

## 2015-10-17 DIAGNOSIS — F064 Anxiety disorder due to known physiological condition: Secondary | ICD-10-CM

## 2015-10-17 LAB — CBC WITH DIFFERENTIAL (CANCER CENTER ONLY)
BASO#: 0.1 10*3/uL (ref 0.0–0.2)
BASO%: 0.5 % (ref 0.0–2.0)
EOS%: 2.2 % (ref 0.0–7.0)
Eosinophils Absolute: 0.3 10*3/uL (ref 0.0–0.5)
HCT: 31.6 % — ABNORMAL LOW (ref 38.7–49.9)
HGB: 11.6 g/dL — ABNORMAL LOW (ref 13.0–17.1)
LYMPH#: 3.4 10*3/uL — ABNORMAL HIGH (ref 0.9–3.3)
LYMPH%: 28.9 % (ref 14.0–48.0)
MCH: 31.4 pg (ref 28.0–33.4)
MCHC: 36.7 g/dL — ABNORMAL HIGH (ref 32.0–35.9)
MCV: 85 fL (ref 82–98)
MONO#: 1.4 10*3/uL — ABNORMAL HIGH (ref 0.1–0.9)
MONO%: 12 % (ref 0.0–13.0)
NEUT#: 6.7 10*3/uL — ABNORMAL HIGH (ref 1.5–6.5)
NEUT%: 56.4 % (ref 40.0–80.0)
Platelets: 305 10*3/uL (ref 145–400)
RBC: 3.7 10*6/uL — AB (ref 4.20–5.70)
RDW: 24.2 % — ABNORMAL HIGH (ref 11.1–15.7)
WBC: 11.9 10*3/uL — AB (ref 4.0–10.0)

## 2015-10-17 LAB — CMP (CANCER CENTER ONLY)
ALBUMIN: 4 g/dL (ref 3.3–5.5)
ALK PHOS: 107 U/L — AB (ref 26–84)
ALT: 31 U/L (ref 10–47)
AST: 42 U/L — AB (ref 11–38)
BILIRUBIN TOTAL: 3.8 mg/dL — AB (ref 0.20–1.60)
BUN, Bld: 11 mg/dL (ref 7–22)
CALCIUM: 9.1 mg/dL (ref 8.0–10.3)
CO2: 26 mEq/L (ref 18–33)
Chloride: 105 mEq/L (ref 98–108)
Creat: 0.8 mg/dl (ref 0.6–1.2)
Glucose, Bld: 98 mg/dL (ref 73–118)
Potassium: 3.9 mEq/L (ref 3.3–4.7)
Sodium: 136 mEq/L (ref 128–145)
Total Protein: 7.5 g/dL (ref 6.4–8.1)

## 2015-10-17 LAB — TECHNOLOGIST REVIEW CHCC SATELLITE: Tech Review: 3

## 2015-10-17 MED ORDER — ALPRAZOLAM 1 MG PO TABS: 1.0000 mg | ORAL_TABLET | Freq: Three times a day (TID) | ORAL | Status: DC | PRN

## 2015-10-17 MED ORDER — OXYCODONE HCL ER 80 MG PO T12A: 80.0000 mg | EXTENDED_RELEASE_TABLET | Freq: Two times a day (BID) | ORAL | Status: DC

## 2015-10-17 MED ORDER — OXYCODONE HCL 30 MG PO TABS: 30.0000 mg | ORAL_TABLET | Freq: Four times a day (QID) | ORAL | Status: DC | PRN
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
## 2015-10-18 LAB — IRON AND TIBC
%SAT: 56 % — ABNORMAL HIGH (ref 20–55)
IRON: 170 ug/dL — AB (ref 42–163)
TIBC: 305 ug/dL (ref 202–409)
UIBC: 135 ug/dL (ref 117–376)

## 2015-10-18 LAB — RETICULOCYTES: Reticulocyte Count: 16.5 % — ABNORMAL HIGH (ref 0.6–2.6)

## 2015-10-18 LAB — FERRITIN: Ferritin: 172 ng/ml (ref 22–316)

## 2015-10-19 LAB — HEMOGLOBINOPATHY EVALUATION
HEMOGLOBIN A2 QUANTITATION: 4.1 % — AB (ref 0.7–3.1)
HEMOGLOBIN F QUANTITATION: 7.4 % — AB (ref 0.0–2.0)
HGB A: 14.9 % — AB (ref 94.0–98.0)
HGB C: 0 %
HGB S: 73.6 % — ABNORMAL HIGH

## 2015-10-20 ENCOUNTER — Ambulatory Visit: Payer: Medicare Other | Admitting: Physical Therapy

## 2015-10-20 DIAGNOSIS — M25612 Stiffness of left shoulder, not elsewhere classified: Secondary | ICD-10-CM | POA: Diagnosis not present

## 2015-10-20 DIAGNOSIS — M6281 Muscle weakness (generalized): Secondary | ICD-10-CM | POA: Diagnosis not present

## 2015-10-20 DIAGNOSIS — M25512 Pain in left shoulder: Secondary | ICD-10-CM | POA: Diagnosis not present

## 2015-10-20 NOTE — Therapy (Signed)
[facility] [address] [place], [place], [address] Phone: [phone]   Fax:  [phone]  Physical Therapy Treatment  Patient Details  Name: [name] MRN: [id number] Date of Birth: [birth year] Referring Provider: Magdalene Patricia, MD ([facility])  Encounter Date: 10/20/2015      PT End of Session - 10/20/15 1103    Visit Number 3   Number of Visits 8   Date for PT Re-Evaluation 11/22/15   Authorization Type Medicare: Kx mod by 15th visit, and progres by the 10th visit.    PT Start Time 1015   PT Stop Time 1105   PT Time Calculation (min) 50 min   Activity Tolerance Patient tolerated treatment well   Behavior During Therapy WFL for tasks assessed/performed      Past Medical History  Diagnosis Date  . Sickle cell anemia [facility])     Past Surgical History  Procedure Laterality Date  . Cholecystectomy      There were no vitals filed for this visit.      Subjective Assessment - 10/20/15 1020    Subjective "still feeling alot of tightness in the shoulders, but I've  been working on my HEP"   Currently in Pain? Yes   Pain Score 6  just took pain meds   Pain Location Neck   Pain Orientation Left   Pain Descriptors / Indicators Sore   Pain Type Chronic pain   Aggravating Factors  Looking to the Left and leaning the right                         [facility] Adult PT Treatment/Exercise - 10/20/15 0001    Shoulder Exercises: ROM/Strengthening   UBE (Upper Arm Bike) L1 x 5 min  forwaed 2:30/ backward 2:30   Manual Therapy   Manual Therapy Myofascial release   Soft tissue mobilization IASTM over the L upper trap   Myofascial Release Fascial sustained stretchin/ rolling over the L upper trap and levator scpaulae   Neck Exercises: Stretches   Upper Trapezius Stretch 2 reps;30 seconds          Trigger Point Dry Needling - 10/20/15 1046    Consent Given? Yes   Education Handout Provided Yes    Muscles Treated Upper Body Upper trapezius   Upper Trapezius Response Twitch reponse elicited;Palpable increased muscle length              PT Education - 10/20/15 1103    Education provided Yes   Education Details dry needling benefits, what to expect and after care   Person(s) Educated Patient   Methods Explanation   Comprehension Verbalized understanding          PT Short Term Goals - 10/13/15 1145    PT SHORT TERM GOAL #1   Title pt will be I with inital HEP (10/27/2015)   Time 4   Period Weeks   Status Achieved   PT SHORT TERM GOAL #2   Title pt will be able to verbalize and demonstrate proper posture and lifting/ carrying mechanics to prevent and reduce neck and shoulder pain (10/27/2015)   Time 4   Status On-going   PT SHORT TERM GOAL #3   Title pt will demonstrate decreased upper trap spasm and surround muscle tightness to decrease pain to </= 4/10 pain and help promote proper scapulohumeral rhythm (10/27/2015)   Time 4   Period Weeks   Status On-going  PT Long Term Goals - 09/27/15 1143    PT LONG TERM GOAL #1   Title pt will be I with all HEP as of last visit (11/22/15)   Time 8   Period Weeks   Status New   PT LONG TERM GOAL #2   Title pt will improve L shoulder abduction to >/= 140 degrees to promote functional lifting and carrying activities (11/22/2015)   Time 8   Period Weeks   Status New   PT LONG TERM GOAL #3   Title pt will improve L shoulder strength to >/= 4+/5 in all planes to assist with functional lifting and carrying activities with </= 2/10 pain (11/22/2015)   Time 8   Period Weeks   Status New   PT LONG TERM GOAL #4   Title pt will be able to lift and carry >/= 8# at shoulder height and/or higher with </= 2/10 pain to assist with reaching overhead shelfs and function lifting and lowering activities (11/22/2015)   Time 8   Period Weeks   Status New   PT LONG TERM GOAL #5   Title At discharge pt will improve his FOTO score  to >/= 66 to demonstrate improvement in function (11/22/2015)   Time 8   Period Weeks   Status New               Plan - 10/20/15 1103    Clinical Impression Statement [name] reports continued tightness in the L upper trap. DN was performed on the L upper trap x 2; pt was monitored throughout treatment. IASTM and myofascial release techniques were performed following DN. Pt reported decreased pain to 3/10 and was able to do exercises following manual. utilized MHP post session to calm down DN soreness.    PT Next Visit Plan assess response to DN, review stretches, add scalenes stretch, scap stab strength,    Consulted and Agree with Plan of Care Patient      Patient will benefit from skilled therapeutic intervention in order to improve the following deficits and impairments:  Pain, Improper body mechanics, Postural dysfunction, Decreased endurance, Hypomobility, Decreased strength, Decreased mobility, Increased fascial restricitons, Impaired UE functional use, Decreased activity tolerance, Decreased range of motion  Visit Diagnosis: Pain in left shoulder  Stiffness of left shoulder, not elsewhere classified  Muscle weakness (generalized)     Problem List Patient Active Problem List   Diagnosis Date Noted  . Acute respiratory failure (HCC) 12/27/2014  . Systemic inflammatory response syndrome (SIRS) (HCC) 12/27/2014  . Chronic pain 01/26/2014  . Anxiety disorder 02/06/2013  . 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 Avascular bone necrosis (HCC) 01/27/2013  . Fever 01/24/2013  . Encephalopathy, toxic 01/24/2013  . Acute respiratory failure with hypoxia (HCC) 01/23/2013  . Acute delirium 02/06/2012  . Dehydration 02/04/2012  . Constipation 09/13/2011  . Leukocytosis 09/11/2011  . Sickle cell anemia with crisis (HCC) 04/16/2011  . Pruritus 04/16/2011   Lulu RidingKristoffer Sheenah Dimitroff PT, DPT, LAT, ATC  10/20/2015  11:07 AM      [facility]  [facility] [address] [place], [place], [address] Phone: [phone]   Fax:  [phone]  Name: Kerrie PleasureYaw N Beaumont MRN: [id number] Date of Birth: July 12, 1980

## 2015-10-27 ENCOUNTER — Ambulatory Visit: Payer: Medicare Other | Admitting: Physical Therapy

## 2015-10-27 DIAGNOSIS — M25612 Stiffness of left shoulder, not elsewhere classified: Secondary | ICD-10-CM | POA: Diagnosis not present

## 2015-10-27 DIAGNOSIS — M6281 Muscle weakness (generalized): Secondary | ICD-10-CM | POA: Diagnosis not present

## 2015-10-27 DIAGNOSIS — M25512 Pain in left shoulder: Secondary | ICD-10-CM | POA: Diagnosis not present

## 2015-10-27 NOTE — Therapy (Signed)
[facility] [address] [place], [place], [address] Phone: [phone]   Fax:  [phone]  Physical Therapy Treatment  Patient Details  Name: [name] MRN: [id number] Date of Birth: [birth year] Referring Provider: Magdalene Patriciayan Leonard Freeman, MD ([facility])  Encounter Date: 10/27/2015      PT End of Session - 10/27/15 1106    Visit Number 4   Number of Visits 8   Date for PT Re-Evaluation 11/22/15   Authorization Type Medicare: Kx mod by 15th visit, and progres by the 10th visit.    PT Start Time 1015   PT Stop Time 1109   PT Time Calculation (min) 54 min   Activity Tolerance Patient tolerated treatment well   Behavior During Therapy WFL for tasks assessed/performed      Past Medical History  Diagnosis Date  . Sickle cell anemia [facility](HCC)     Past Surgical History  Procedure Laterality Date  . Cholecystectomy      There were no vitals filed for this visit.      Subjective Assessment - 10/27/15 1023    Subjective "the DN really helped last session:    Currently in Pain? Yes   Pain Score 6    Pain Location Neck   Pain Orientation Left   Pain Descriptors / Indicators Aching   Pain Type Chronic pain   Pain Onset More than a month ago   Pain Frequency Constant   Aggravating Factors  looking to the L    Pain Relieving Factors stretches                         [facility] Adult PT Treatment/Exercise - 10/27/15 1050    Shoulder Exercises: Standing   Extension 20 reps;Theraband   Theraband Level (Shoulder Extension) Level 3 (Green)   Row AROM;Strengthening;Both;Theraband;20 reps   Theraband Level (Shoulder Row) Level 3 (Green)   Other Standing Exercises D1 -D2 PNF yellow theraband 2 x 10   Shoulder Exercises: ROM/Strengthening   UBE (Upper Arm Bike) L2 x 6 min  forward 3 min/ backward 3 min   Moist Heat Therapy   Number Minutes Moist Heat 10 Minutes   Moist Heat Location Cervical;Shoulder  pt pt  in supine   Manual Therapy   Manual Therapy Taping   Soft tissue mobilization IASTM over the L upper trap   Myofascial Release Fascial sustained stretchin/ rolling over the L upper trap and levator scpaulae   McConnell L upper trap inhibition taping trial  reported decreased tighntess with L rot/ r sidebending   Neck Exercises: Stretches   Upper Trapezius Stretch 30 seconds;3 reps  contract/ relax with 10 sec hold   Levator Stretch 2 reps;30 seconds          Trigger Point Dry Needling - 10/27/15 1026    Consent Given? Yes   Muscles Treated Upper Body Upper trapezius   Upper Trapezius Response Twitch reponse elicited;Palpable increased muscle length  x 3 pistoning and twisting techniques              PT Education - 10/27/15 1105    Education provided Yes   Education Details upper trap inhibition taping to decrease upper trap activations   Person(s) Educated Patient   Methods Explanation;Verbal cues   Comprehension Verbalized understanding          PT Short Term Goals - 10/27/15 1108    PT SHORT TERM GOAL #1   Title pt will  be I with inital HEP (10/27/2015)   Time 4   Period Weeks   Status Achieved   PT SHORT TERM GOAL #2   Title pt will be able to verbalize and demonstrate proper posture and lifting/ carrying mechanics to prevent and reduce neck and shoulder pain (10/27/2015)   Time 4   Period Weeks   Status Achieved   PT SHORT TERM GOAL #3   Title pt will demonstrate decreased upper trap spasm and surround muscle tightness to decrease pain to </= 4/10 pain and help promote proper scapulohumeral rhythm (10/27/2015)   Time 4   Period Weeks   Status On-going           PT Long Term Goals - 10/27/15 1109    PT LONG TERM GOAL #1   Title pt will be I with all HEP as of last visit (11/22/15)   Time 8   Period Weeks   Status On-going   PT LONG TERM GOAL #2   Title pt will improve L shoulder abduction to >/= 140 degrees to promote functional lifting and  carrying activities (11/22/2015)   Time 8   Period Weeks   Status On-going   PT LONG TERM GOAL #3   Title pt will improve L shoulder strength to >/= 4+/5 in all planes to assist with functional lifting and carrying activities with </= 2/10 pain (11/22/2015)   Time 8   Period Weeks   Status On-going   PT LONG TERM GOAL #4   Title pt will be able to lift and carry >/= 8# at shoulder height and/or higher with </= 2/10 pain to assist with reaching overhead shelfs and function lifting and lowering activities (11/22/2015)   Time 8   Period Weeks   Status On-going   PT LONG TERM GOAL #5   Title At discharge pt will improve his FOTO score to >/= 66 to demonstrate improvement in function (11/22/2015)   Time 8   Period Weeks   Status On-going               Plan - 10/27/15 1106    Clinical Impression Statement Mr. Mages states he is alittle more sore today but has been able to do more at home. states the DN helped last session, DN was performed x 3 on the L upper trap; pt monitored throughout session with Soft tissue work performed following. attempted trial of upper trap inhibition taping which he rpeorted signficant relief of tightness. pt was able to perform exercises with report of pain dropping to 3/10.    PT Next Visit Plan assess response to DN / inhibition taping, review stretches, add scalenes stretch, scap stab strength, give D1/D2 as HEP    Consulted and Agree with Plan of Care Patient      Patient will benefit from skilled therapeutic intervention in order to improve the following deficits and impairments:  Pain, Improper body mechanics, Postural dysfunction, Decreased endurance, Hypomobility, Decreased strength, Decreased mobility, Increased fascial restricitons, Impaired UE functional use, Decreased activity tolerance, Decreased range of motion  Visit Diagnosis: Pain in left shoulder  Stiffness of left shoulder, not elsewhere classified  Muscle weakness  (generalized)     Problem List Patient Active Problem List   Diagnosis Date Noted  . Acute respiratory failure (HCC) 12/27/2014  . Systemic inflammatory response syndrome (SIRS) (HCC) 12/27/2014  . Chronic pain 01/26/2014  . Anxiety disorder 02/06/2013  . 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 Avascular bone necrosis (HCC) 01/27/2013  .  Fever 01/24/2013  . Encephalopathy, toxic 01/24/2013  . Acute respiratory failure with hypoxia (HCC) 01/23/2013  . Acute delirium 02/06/2012  . Dehydration 02/04/2012  . Constipation 09/13/2011  . Leukocytosis 09/11/2011  . Sickle cell anemia with crisis (HCC) 04/16/2011  . Pruritus 04/16/2011   Lulu Riding PT, DPT, LAT, ATC  10/27/2015  11:10 AM      [facility] [address] [place], [place], [address] Phone: [phone]   Fax:  [phone]  Name: [name] MRN: [id number] Date of Birth: 1980/07/06

## 2015-11-03 ENCOUNTER — Ambulatory Visit: Payer: Medicare Other | Admitting: Physical Therapy

## 2015-11-03 DIAGNOSIS — M25512 Pain in left shoulder: Secondary | ICD-10-CM

## 2015-11-03 DIAGNOSIS — M6281 Muscle weakness (generalized): Secondary | ICD-10-CM

## 2015-11-03 DIAGNOSIS — M25612 Stiffness of left shoulder, not elsewhere classified: Secondary | ICD-10-CM

## 2015-11-03 NOTE — Therapy (Signed)
[facility] Outpatient Rehabilitation [facility] [address] [place], [place], [address] Phone: [phone]   Fax:  [phone]  Physical Therapy Treatment  Patient Details  Name: [name] MRN: [id number] Date of Birth: Aug 12, 1980 Referring Provider: Magdalene Patricia, MD ([facility])  Encounter Date: 11/03/2015      PT End of Session - 11/03/15 1101    Visit Number 5   Number of Visits 8   Date for PT Re-Evaluation 11/22/15   Authorization Type Medicare: Kx mod by 15th visit, and progres by the 10th visit.    PT Start Time 1015   PT Stop Time 1106   PT Time Calculation (min) 51 min   Activity Tolerance Patient tolerated treatment well   Behavior During Therapy WFL for tasks assessed/performed      Past Medical History:  Diagnosis Date  . Sickle cell anemia (HCC)     Past Surgical History:  Procedure Laterality Date  . CHOLECYSTECTOMY      There were no vitals filed for this visit.      Subjective Assessment - 11/03/15 1018    Subjective " The DN helps for a couple of days then comes back, and today I am feeling more sore at a 7/10"   Pain Score 7    Pain Location Neck   Pain Orientation Left   Pain Descriptors / Indicators Aching   Pain Onset More than a month ago   Aggravating Factors  N/A   Pain Relieving Factors DN,                          [facility] Adult PT Treatment/Exercise - 11/03/15 0001      Shoulder Exercises: ROM/Strengthening   UBE (Upper Arm Bike) L2 x 6 min     Moist Heat Therapy   Number Minutes Moist Heat 10 Minutes   Moist Heat Location Cervical;Shoulder     Manual Therapy   Soft tissue mobilization IASTM over the L upper trap   Myofascial Release Fascial sustained stretchin/ rolling over the L upper trap and levator scpaulae     Neck Exercises: Stretches   Upper Trapezius Stretch 30 seconds;3 reps   Levator Stretch 2 reps;30 seconds   Other Neck Stretches Scalene/ SCM stretch on L 2 x 30  sec          Trigger Point Dry Needling - 11/03/15 1059    Consent Given? Yes   Education Handout Provided Yes   Muscles Treated Upper Body Upper trapezius  L scalene x 1    Upper Trapezius Response Twitch reponse elicited;Palpable increased muscle length  x2 with pistoning/ twisting technique              PT Education - 11/03/15 1100    Education provided Yes   Education Details manual trigger point release techniques and how to perform them using tennis ball or theracane; Also where theracane can be purchased.    Person(s) Educated Patient   Methods Explanation;Verbal cues   Comprehension Verbalized understanding;Verbal cues required          PT Short Term Goals - 10/27/15 1108      PT SHORT TERM GOAL #1   Title pt will be I with inital HEP (10/27/2015)   Time 4   Period Weeks   Status Achieved     PT SHORT TERM GOAL #2   Title pt will be able to verbalize and demonstrate proper posture and lifting/ carrying  mechanics to prevent and reduce neck and shoulder pain (10/27/2015)   Time 4   Period Weeks   Status Achieved     PT SHORT TERM GOAL #3   Title pt will demonstrate decreased upper trap spasm and surround muscle tightness to decrease pain to </= 4/10 pain and help promote proper scapulohumeral rhythm (10/27/2015)   Time 4   Period Weeks   Status On-going           PT Long Term Goals - 10/27/15 1109      PT LONG TERM GOAL #1   Title pt will be I with all HEP as of last visit (11/22/15)   Time 8   Period Weeks   Status On-going     PT LONG TERM GOAL #2   Title pt will improve L shoulder abduction to >/= 140 degrees to promote functional lifting and carrying activities (11/22/2015)   Time 8   Period Weeks   Status On-going     PT LONG TERM GOAL #3   Title pt will improve L shoulder strength to >/= 4+/5 in all planes to assist with functional lifting and carrying activities with </= 2/10 pain (11/22/2015)   Time 8   Period Weeks   Status On-going      PT LONG TERM GOAL #4   Title pt will be able to lift and carry >/= 8# at shoulder height and/or higher with </= 2/10 pain to assist with reaching overhead shelfs and function lifting and lowering activities (11/22/2015)   Time 8   Period Weeks   Status On-going     PT LONG TERM GOAL #5   Title At discharge pt will improve his FOTO score to >/= 66 to demonstrate improvement in function (11/22/2015)   Time 8   Period Weeks   Status On-going               Plan - 11/03/15 1101    Clinical Impression Statement Mr. Landin reprots increased pain to 7/10 today. focused on DN to calm down tightness of L upper trap/ Scalenes; pt monitored throughout treatment. Following Soft tissue work, pt performed MTPR with verbal cues on correct form. utilized MHP post session to calm down soreness.    PT Next Visit Plan assess response to DN / inhibition taping, review stretches, add scalenes stretch, scap stab strength, give D1/D2 as HEP       Patient will benefit from skilled therapeutic intervention in order to improve the following deficits and impairments:     Visit Diagnosis: Pain in left shoulder  Stiffness of left shoulder, not elsewhere classified  Muscle weakness (generalized)     Problem List Patient Active Problem List   Diagnosis Date Noted  . Acute respiratory failure (HCC) 12/27/2014  . Systemic inflammatory response syndrome (SIRS) (HCC) 12/27/2014  . Chronic pain 01/26/2014  . Anxiety disorder 02/06/2013  . 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 Avascular bone necrosis (HCC) 01/27/2013  . Fever 01/24/2013  . Encephalopathy, toxic 01/24/2013  . Acute respiratory failure with hypoxia (HCC) 01/23/2013  . Acute delirium 02/06/2012  . Dehydration 02/04/2012  . Constipation 09/13/2011  . Leukocytosis 09/11/2011  . Sickle cell anemia with crisis (HCC) 04/16/2011  . Pruritus 04/16/2011   Lulu Riding PT, DPT, LAT, ATC  11/03/15  11:08 AM      [facility]  Health Outpatient Rehabilitation [facility] [address] [place], [place], [address] Phone: [phone]   Fax:  [phone]  Name: Quantae Martel  Labelle MRN: [id number] Date of Birth: April 27, 1980

## 2015-11-10 ENCOUNTER — Ambulatory Visit: Payer: Medicare Other | Attending: Family Medicine | Admitting: Physical Therapy

## 2015-11-10 DIAGNOSIS — M25512 Pain in left shoulder: Secondary | ICD-10-CM | POA: Insufficient documentation

## 2015-11-10 DIAGNOSIS — M25612 Stiffness of left shoulder, not elsewhere classified: Secondary | ICD-10-CM | POA: Insufficient documentation

## 2015-11-10 DIAGNOSIS — M6281 Muscle weakness (generalized): Secondary | ICD-10-CM
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
## 2015-11-14 ENCOUNTER — Ambulatory Visit: Payer: Medicare Other | Admitting: Physical Therapy

## 2015-11-14 DIAGNOSIS — M25612 Stiffness of left shoulder, not elsewhere classified: Secondary | ICD-10-CM

## 2015-11-14 DIAGNOSIS — M6281 Muscle weakness (generalized): Secondary | ICD-10-CM

## 2015-11-14 DIAGNOSIS — M25512 Pain in left shoulder: Secondary | ICD-10-CM

## 2015-11-14 NOTE — Therapy (Signed)
[facility] [address] [place], [place], [address] Phone: [phone]   Fax:  [phone]  Physical Therapy Treatment  Patient Details  Name: [name] MRN: [id number] Date of Birth: 02-Aug-1980 Referring Provider: Magdalene Patriciayan Leonard Freeman, MD ([facility])  Encounter Date: 11/14/2015      PT End of Session - 11/14/15 1031    Visit Number 7   Number of Visits 8   Date for PT Re-Evaluation 11/22/15   Authorization Type Medicare: Kx mod by 15th visit, and progres by the 10th visit.    PT Start Time 1015   PT Stop Time 1108   PT Time Calculation (min) 53 min   Activity Tolerance Patient tolerated treatment well   Behavior During Therapy WFL for tasks assessed/performed      Past Medical History:  Diagnosis Date  . Sickle cell anemia (HCC)     Past Surgical History:  Procedure Laterality Date  . CHOLECYSTECTOMY      There were no vitals filed for this visit.      Subjective Assessment - 11/14/15 1020    Subjective "I am doing pretty well"    Currently in Pain? Yes   Pain Score 5    Pain Location Neck   Pain Orientation Left   Pain Descriptors / Indicators Aching   Pain Type Chronic pain   Pain Onset More than a month ago   Pain Frequency Constant   Pain Score 5   Pain Location Neck   Pain Orientation Left   Pain Descriptors / Indicators Aching   Pain Type Chronic pain   Pain Frequency Constant   Aggravating Factors  looking to the L   Pain Relieving Factors pain meds            [facility] PT Assessment - 11/14/15 0001      Observation/Other Assessments   Focus on Therapeutic Outcomes (FOTO)  43% limited     AROM   Left Shoulder ABduction 160 Degrees   Left Shoulder Internal Rotation 92 Degrees   Left Shoulder External Rotation 92 Degrees     Strength   Left Shoulder Flexion 4/5   Left Shoulder Extension 5/5   Left Shoulder ABduction 4/5   Left Shoulder Internal Rotation 4+/5   Left Shoulder  External Rotation 4/5                     [facility] Adult PT Treatment/Exercise - 11/14/15 0001      Shoulder Exercises: Standing   Extension 20 reps;Theraband   Theraband Level (Shoulder Extension) Level 3 (Green)   Row AROM;Strengthening;Both;Theraband;20 reps   Theraband Level (Shoulder Row) Level 3 (Green)   Other Standing Exercises D1 -D2 PNF yellow theraband 2 x 10     Shoulder Exercises: ROM/Strengthening   UBE (Upper Arm Bike) L2 x 6 min     Moist Heat Therapy   Number Minutes Moist Heat 10 Minutes   Moist Heat Location Cervical;Shoulder     Manual Therapy   Soft tissue mobilization IASTM over the L upper trap   Myofascial Release Fascial sustained stretchin/ rolling over the L upper trap and levator scpaulae          Trigger Point Dry Needling - 11/14/15 1035    Consent Given? Yes   Education Handout Provided Yes   Sternocleidomastoid Response Twitch response elicited;Palpable increased muscle length  x 2 with twisting technique, x 2 in scalenes   Upper Trapezius Response Palpable increased muscle  length;Twitch reponse elicited  x 2                 PT Short Term Goals - 10/27/15 1108      PT SHORT TERM GOAL #1   Title pt will be I with inital HEP (10/27/2015)   Time 4   Period Weeks   Status Achieved     PT SHORT TERM GOAL #2   Title pt will be able to verbalize and demonstrate proper posture and lifting/ carrying mechanics to prevent and reduce neck and shoulder pain (10/27/2015)   Time 4   Period Weeks   Status Achieved     PT SHORT TERM GOAL #3   Title pt will demonstrate decreased upper trap spasm and surround muscle tightness to decrease pain to </= 4/10 pain and help promote proper scapulohumeral rhythm (10/27/2015)   Time 4   Period Weeks   Status On-going           PT Long Term Goals - 10/27/15 1109      PT LONG TERM GOAL #1   Title pt will be I with all HEP as of last visit (11/22/15)   Time 8   Period Weeks   Status  On-going     PT LONG TERM GOAL #2   Title pt will improve L shoulder abduction to >/= 140 degrees to promote functional lifting and carrying activities (11/22/2015)   Time 8   Period Weeks   Status On-going     PT LONG TERM GOAL #3   Title pt will improve L shoulder strength to >/= 4+/5 in all planes to assist with functional lifting and carrying activities with </= 2/10 pain (11/22/2015)   Time 8   Period Weeks   Status On-going     PT LONG TERM GOAL #4   Title pt will be able to lift and carry >/= 8# at shoulder height and/or higher with </= 2/10 pain to assist with reaching overhead shelfs and function lifting and lowering activities (11/22/2015)   Time 8   Period Weeks   Status On-going     PT LONG TERM GOAL #5   Title At discharge pt will improve his FOTO score to >/= 66 to demonstrate improvement in function (11/22/2015)   Time 8   Period Weeks   Status On-going               Plan - 11/14/15 1305    Clinical Impression Statement [name] reports decreased pain since the last session. He demonstrates significant improvement in shoulder mobility and some improvement in strength. DN was performed on L scalenes, SCM and uppertrap. Following Soft tissue work, and stretching he was to do exercise with minimal report of soreness post session. MHP to calm down pain.    PT Next Visit Plan assess response to DN / inhibition taping, review stretches, add scalenes stretch, scap stab strength, give D1/D2 as HEP, measure, goals, Renewal   Consulted and Agree with Plan of Care Patient      Patient will benefit from skilled therapeutic intervention in order to improve the following deficits and impairments:  Pain, Improper body mechanics, Postural dysfunction, Decreased endurance, Hypomobility, Decreased strength, Decreased mobility, Increased fascial restricitons, Impaired UE functional use, Decreased activity tolerance, Decreased range of motion  Visit Diagnosis: Pain in left  shoulder  Stiffness of left shoulder, not elsewhere classified  Muscle weakness (generalized)     Problem List Patient Active Problem List   Diagnosis Date  Noted  . Acute respiratory failure (HCC) 12/27/2014  . Systemic inflammatory response syndrome (SIRS) (HCC) 12/27/2014  . Chronic pain 01/26/2014  . Anxiety disorder 02/06/2013  . 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 Avascular bone necrosis (HCC) 01/27/2013  . Fever 01/24/2013  . Encephalopathy, toxic 01/24/2013  . Acute respiratory failure with hypoxia (HCC) 01/23/2013  . Acute delirium 02/06/2012  . Dehydration 02/04/2012  . Constipation 09/13/2011  . Leukocytosis 09/11/2011  . Sickle cell anemia with crisis (HCC) 04/16/2011  . Pruritus 04/16/2011   Lulu Riding PT, DPT, LAT, ATC  11/14/15  1:11 PM      [facility] Outpatient Rehabilitation [facility] [address] [place], [place], [address] Phone: [phone]   Fax:  [phone]  Name: [name] MRN: [id number] Date of Birth: Nov 29, 1980

## 2015-11-22 ENCOUNTER — Other Ambulatory Visit: Payer: Self-pay | Admitting: Nurse Practitioner

## 2015-11-22 ENCOUNTER — Ambulatory Visit: Payer: Medicare Other | Admitting: Physical Therapy

## 2015-11-22 DIAGNOSIS — M25512 Pain in left shoulder: Secondary | ICD-10-CM

## 2015-11-22 DIAGNOSIS — M25612 Stiffness of left shoulder, not elsewhere classified: Secondary | ICD-10-CM

## 2015-11-22 DIAGNOSIS — F064 Anxiety disorder due to known physiological condition: Secondary | ICD-10-CM

## 2015-11-22 DIAGNOSIS — Z0000 Encounter for general adult medical examination without abnormal findings: Secondary | ICD-10-CM

## 2015-11-22 DIAGNOSIS — G4701 Insomnia due to medical condition: Secondary | ICD-10-CM

## 2015-11-22 DIAGNOSIS — M6281 Muscle weakness (generalized): Secondary | ICD-10-CM | POA: Diagnosis not present

## 2015-11-22 DIAGNOSIS — D5700 Hb-SS disease with crisis, unspecified: Secondary | ICD-10-CM

## 2015-11-22 MED ORDER — OXYCODONE HCL 30 MG PO TABS: 30.0000 mg | ORAL_TABLET | Freq: Four times a day (QID) | ORAL | 0 refills | Status: DC | PRN

## 2015-11-22 MED ORDER — OXYCODONE HCL ER 80 MG PO T12A: 80.0000 mg | EXTENDED_RELEASE_TABLET | Freq: Two times a day (BID) | ORAL | 0 refills | Status: DC

## 2015-11-22 MED ORDER — ALPRAZOLAM 1 MG PO TABS: 1.0000 mg | ORAL_TABLET | Freq: Three times a day (TID) | ORAL | 0 refills | Status: DC | PRN

## 2015-11-22 NOTE — Therapy (Signed)
[place], [place], [address] Phone: [phone]   Fax:  [phone]  Physical Therapy Treatment / Re-certification  Patient Details  Name: [name] MRN: [id number] Date of Birth: [birth year] Referring Provider: Jacqualine Mau, MD ([place] Physician)  Encounter Date: 11/22/2015      PT End of Session - 11/22/15 0933    Visit Number 8   Number of Visits 16   Date for PT Re-Evaluation 01/17/16   Authorization Type Medicare: Kx mod by 15th visit, and progres by the 16th visit.    PT Start Time 0932   PT Stop Time 1024   PT Time Calculation (min) 52 min   Activity Tolerance Patient tolerated treatment well   Behavior During Therapy WFL for tasks assessed/performed      Past Medical History:  Diagnosis Date  . Sickle cell anemia (HCC)     Past Surgical History:  Procedure Laterality Date  . CHOLECYSTECTOMY      There were no vitals filed for this visit.      Subjective Assessment - 11/22/15 0933    Subjective "The shoulder is doing really well today"   Currently in Pain? Yes   Pain Score 5    Pain Location Neck   Pain Orientation Left   Pain Descriptors / Indicators Aching   Pain Type Chronic pain   Pain Onset More than a month ago   Pain Frequency Constant   Aggravating Factors  Weather   Pain Relieving Factors stretching, pain medication            [facility] PT Assessment - 11/22/15 0001      AROM   AROM Assessment Site Cervical   Left Shoulder Flexion 160 Degrees   Left Shoulder ABduction 179 Degrees   Left Shoulder Internal Rotation 102 Degrees   Left Shoulder External Rotation 88 Degrees   Cervical Flexion 64   Cervical Extension 50   Cervical - Right Side Bend 40   Cervical - Left Side Bend 48   Cervical - Right Rotation 70   Cervical - Left Rotation 52     Strength   Left Shoulder Flexion 4+/5   Left Shoulder Extension 5/5   Left Shoulder ABduction 4+/5   Left  Shoulder Internal Rotation 4+/5   Left Shoulder External Rotation 4/5                     [facility] Adult PT Treatment/Exercise - 11/22/15 0001      Shoulder Exercises: ROM/Strengthening   UBE (Upper Arm Bike) L1 x 5 min  changing direction at 2:30     Moist Heat Therapy   Number Minutes Moist Heat 10 Minutes   Moist Heat Location Cervical;Shoulder     Manual Therapy   Soft tissue mobilization IASTM over the L upper trap   Myofascial Release Fascial sustained stretchin/ rolling over the L upper trap and levator scpaulae     Neck Exercises: Stretches   Upper Trapezius Stretch 2 reps;30 seconds          Trigger Point Dry Needling - 11/22/15 0953    Consent Given? Yes   Education Handout Provided No  given previously   Upper Trapezius Response Twitch reponse elicited;Palpable increased muscle length  x 3 on L with pistoning technique              PT Education - 11/22/15 1013    Education provided Yes   Education Details HEP  Review    Person(s) Educated Patient   Methods Explanation;Verbal cues   Comprehension Verbalized understanding          PT Short Term Goals - 11/22/15 0943      PT SHORT TERM GOAL #1   Title pt will be I with inital HEP (10/27/2015)   Time 4   Period Weeks   Status Achieved     PT SHORT TERM GOAL #2   Title pt will be able to verbalize and demonstrate proper posture and lifting/ carrying mechanics to prevent and reduce neck and shoulder pain (10/27/2015)   Time 4   Period Weeks   Status Achieved     PT SHORT TERM GOAL #3   Title pt will demonstrate decreased upper trap spasm and surround muscle tightness to decrease pain to </= 4/10 pain and help promote proper scapulohumeral rhythm (10/27/2015)   Baseline 5/10 pain in the shoulder, but improving mobility   Time 4   Period Weeks   Status Partially Met           PT Long Term Goals - 11/22/15 0944      PT LONG TERM GOAL #1   Title pt will be I with all HEP as of  last visit (11/22/15)   Time 8   Period Weeks   Status On-going     PT LONG TERM GOAL #2   Title pt will improve L shoulder abduction to >/= 140 degrees to promote functional lifting and carrying activities (11/22/2015)   Time 8   Period Weeks   Status Achieved     PT LONG TERM GOAL #3   Time 8   Period Weeks   Status On-going     PT LONG TERM GOAL #4   Title pt will be able to lift and carry >/= 8# at shoulder height and/or higher with </= 2/10 pain to assist with reaching overhead shelfs and function lifting and lowering activities (11/22/2015)   Baseline able to lift 8# but continues to have 5/10 pain   Time 8   Period Weeks   Status Partially Met     PT LONG TERM GOAL #5   Title At discharge pt will improve his FOTO score to >/= 66 to demonstrate improvement in function (11/22/2015)   Baseline 51 today   Time 8   Period Weeks   Status Partially Met               Plan - 11/22/15 1008    Clinical Impression Statement [name] is progressing well with physical therapy with improvement with shoulder mobility in all planes and cervical mobility. He continues to report pain at 5/10 noted on the L upper trap and surrounding musculature which limited L rotation and R side bending. He is progressing well toward his goals. Continued DN of the L upper trap; pt monitored throughout treatment. following soft tissue work and stretching performed UBE to retrain muscles. plan to continue with current POC to work toward remaining goals and independent exercise.    Rehab Potential Good   PT Frequency 1x / week   PT Duration 8 weeks   PT Treatment/Interventions ADLs/Self Care Home Management;Cryotherapy;Electrical Stimulation;Iontophoresis 83m/ml Dexamethasone;Moist Heat;Therapeutic exercise;Therapeutic activities;Manual techniques;Taping;Dry needling;Ultrasound;Passive range of motion;Patient/family education   PT Next Visit Plan assess response to DN,review stretches, add scalenes  stretch, scap stab strength, give D1/D2 as HEP, rotator cuff strengthening   Consulted and Agree with Plan of Care Patient      Patient will  benefit from skilled therapeutic intervention in order to improve the following deficits and impairments:  Pain, Improper body mechanics, Postural dysfunction, Decreased endurance, Hypomobility, Decreased strength, Decreased mobility, Increased fascial restricitons, Impaired UE functional use, Decreased activity tolerance, Decreased range of motion  Visit Diagnosis: Pain in left shoulder - Plan: PT plan of care cert/re-cert  Stiffness of left shoulder, not elsewhere classified - Plan: PT plan of care cert/re-cert  Muscle weakness (generalized) - Plan: PT plan of care cert/re-cert     Problem List Patient Active Problem List   Diagnosis Date Noted  . Acute respiratory failure ([place]) 12/27/2014  . Systemic inflammatory response syndrome (SIRS) ([place]) 12/27/2014  . Chronic pain 01/26/2014  . Anxiety disorder 02/06/2013  . 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 Avascular bone necrosis ([place]) 01/27/2013  . Fever 01/24/2013  . Encephalopathy, toxic 01/24/2013  . Acute respiratory failure with hypoxia ([place]) 01/23/2013  . Acute delirium 02/06/2012  . Dehydration 02/04/2012  . Constipation 09/13/2011  . Leukocytosis 09/11/2011  . Sickle cell anemia with crisis ([place]) 04/16/2011  . Pruritus 04/16/2011   [place] PT, DPT, LAT, ATC  11/22/15  10:30 AM      [facility] [address] [place], [place], [address] Phone: [phone]   Fax:  [phone]  Name: [name] MRN: [id number] Date of Birth: 27-Jan-1981

## 2015-11-23 ENCOUNTER — Other Ambulatory Visit: Payer: Self-pay | Admitting: *Deleted

## 2015-11-23 DIAGNOSIS — D5700 Hb-SS disease with crisis, unspecified: Secondary | ICD-10-CM

## 2015-11-23 MED FILL — ALPRAZolam 1 MG TABS: 1 | 30 days supply | Qty: 90 | Fill #0

## 2015-11-23 MED FILL — oxyCODONE HCL 30 MG TABS: 30 | 30 days supply | Qty: 120 | Fill #0

## 2015-11-23 MED FILL — OxyCONTIN 80 MG T12A: 80 | 30 days supply | Qty: 60 | Fill #0

## 2015-11-27 DIAGNOSIS — D5701 Hb-SS disease with acute chest syndrome: Secondary | ICD-10-CM | POA: Diagnosis not present

## 2015-11-27 DIAGNOSIS — J189 Pneumonia, unspecified organism: Secondary | ICD-10-CM | POA: Diagnosis not present

## 2015-11-27 DIAGNOSIS — J9601 Acute respiratory failure with hypoxia: Secondary | ICD-10-CM | POA: Diagnosis not present

## 2015-11-27 DIAGNOSIS — R0789 Other chest pain: Secondary | ICD-10-CM | POA: Diagnosis not present

## 2015-11-27 DIAGNOSIS — D5700 Hb-SS disease with crisis, unspecified: Secondary | ICD-10-CM | POA: Diagnosis not present

## 2015-11-27 DIAGNOSIS — M25562 Pain in left knee: Secondary | ICD-10-CM | POA: Diagnosis not present

## 2015-11-27 DIAGNOSIS — G8929 Other chronic pain: Secondary | ICD-10-CM | POA: Diagnosis not present

## 2015-11-27 DIAGNOSIS — M9050 Osteonecrosis in diseases classified elsewhere, unspecified site: Secondary | ICD-10-CM | POA: Diagnosis not present

## 2015-11-27 DIAGNOSIS — N179 Acute kidney failure, unspecified: Secondary | ICD-10-CM | POA: Diagnosis not present

## 2015-11-28 DIAGNOSIS — F419 Anxiety disorder, unspecified: Secondary | ICD-10-CM | POA: Diagnosis present

## 2015-11-28 DIAGNOSIS — J189 Pneumonia, unspecified organism: Secondary | ICD-10-CM | POA: Diagnosis present

## 2015-11-28 DIAGNOSIS — G8929 Other chronic pain: Secondary | ICD-10-CM | POA: Diagnosis not present

## 2015-11-28 DIAGNOSIS — K5903 Drug induced constipation: Secondary | ICD-10-CM | POA: Diagnosis present

## 2015-11-28 DIAGNOSIS — R0789 Other chest pain: Secondary | ICD-10-CM | POA: Diagnosis not present

## 2015-11-28 DIAGNOSIS — J9601 Acute respiratory failure with hypoxia: Secondary | ICD-10-CM | POA: Diagnosis not present

## 2015-11-28 DIAGNOSIS — D571 Sickle-cell disease without crisis: Secondary | ICD-10-CM | POA: Diagnosis not present

## 2015-11-28 DIAGNOSIS — M9050 Osteonecrosis in diseases classified elsewhere, unspecified site: Secondary | ICD-10-CM | POA: Diagnosis present

## 2015-11-28 DIAGNOSIS — N179 Acute kidney failure, unspecified: Secondary | ICD-10-CM | POA: Diagnosis present

## 2015-11-28 DIAGNOSIS — D5700 Hb-SS disease with crisis, unspecified: Secondary | ICD-10-CM | POA: Diagnosis not present

## 2015-11-28 DIAGNOSIS — D5701 Hb-SS disease with acute chest syndrome: Secondary | ICD-10-CM | POA: Diagnosis not present

## 2015-12-01 ENCOUNTER — Encounter: Payer: Medicare Other | Admitting: Physical Therapy

## 2015-12-08 ENCOUNTER — Ambulatory Visit: Payer: Medicare Other | Admitting: Physical Therapy

## 2015-12-14 ENCOUNTER — Ambulatory Visit: Payer: Medicare Other | Attending: Family Medicine | Admitting: Physical Therapy

## 2015-12-14 DIAGNOSIS — M25612 Stiffness of left shoulder, not elsewhere classified: Secondary | ICD-10-CM

## 2015-12-14 DIAGNOSIS — M25512 Pain in left shoulder: Secondary | ICD-10-CM

## 2015-12-14 DIAGNOSIS — M6281 Muscle weakness (generalized): Secondary | ICD-10-CM | POA: Diagnosis not present

## 2015-12-14 NOTE — Therapy (Signed)
[place], [place], [address] Phone: [phone]   Fax:  [phone]  Physical Therapy Treatment  Patient Details  Name: [name] MRN: [id number] Date of Birth: Oct 23, 1980 Referring Provider: Jacqualine Mau, MD ([place] Physician)  Encounter Date: 12/14/2015      PT End of Session - 12/14/15 1049    Visit Number 9   Number of Visits 16   Date for PT Re-Evaluation 01/17/16   Authorization Type Medicare: Kx mod by 15th visit, and progres by the 16th visit.    PT Start Time 1019   PT Stop Time 1115   PT Time Calculation (min) 56 min      Past Medical History:  Diagnosis Date  . Sickle cell anemia (HCC)     Past Surgical History:  Procedure Laterality Date  . CHOLECYSTECTOMY      There were no vitals filed for this visit.      Subjective Assessment - 12/14/15 1022    Subjective I have been in the hospital   Currently in Pain? Yes   Pain Score 5    Pain Location Neck  and upper trap   Pain Orientation Left   Pain Descriptors / Indicators Aching   Aggravating Factors  weather, no reason   Pain Relieving Factors stretching, pain                          [facility] Adult PT Treatment/Exercise - 12/14/15 0001      Shoulder Exercises: Standing   Horizontal ABduction Strengthening;Both;20 reps;Theraband   External Rotation Strengthening;Both;20 reps;Theraband   Internal Rotation Strengthening;20 reps;Theraband   Theraband Level (Shoulder Internal Rotation) Level 2 (Red)   Flexion 20 reps;Theraband   Theraband Level (Shoulder Flexion) Level 1 (Yellow)   Extension 20 reps;Theraband   Theraband Level (Shoulder Extension) Level 3 (Green)   Row AROM;Strengthening;Both;Theraband;20 reps   Theraband Level (Shoulder Row) Level 3 (Green)   Other Standing Exercises D1 -D2 PNF yellow theraband 2 x 10     Shoulder Exercises: ROM/Strengthening   UBE (Upper Arm Bike) L1 x 5 min   changing direction at 2:30     Neck Exercises: Stretches   Upper Trapezius Stretch 2 reps;30 seconds   Levator Stretch 2 reps;30 seconds   Other Neck Stretches Scalene/ SCM stretch on L 2 x 30 sec                PT Education - 12/14/15 1059    Education provided Yes   Education Details scalenes, levator stretch, yellow band PNF   Person(s) Educated Patient   Methods Explanation;Handout   Comprehension Verbalized understanding          PT Short Term Goals - 12/14/15 1109      PT SHORT TERM GOAL #1   Title pt will be I with inital HEP (10/27/2015)   Status Achieved     PT SHORT TERM GOAL #2   Title pt will be able to verbalize and demonstrate proper posture and lifting/ carrying mechanics to prevent and reduce neck and shoulder pain (10/27/2015)   Status Achieved     PT SHORT TERM GOAL #3   Title pt will demonstrate decreased upper trap spasm and surround muscle tightness to decrease pain to </= 4/10 pain and help promote proper scapulohumeral rhythm (10/27/2015)   Baseline 5/10 pain in the shoulder, but improving mobility   Time 4   Period Weeks  Status Partially Met           PT Long Term Goals - 12/14/15 1110      PT LONG TERM GOAL #1   Title pt will be I with all HEP as of last visit (11/22/15)   Time 8   Period Weeks   Status On-going     PT LONG TERM GOAL #2   Title pt will improve L shoulder abduction to >/= 140 degrees to promote functional lifting and carrying activities (11/22/2015)   Status Achieved     PT LONG TERM GOAL #3   Title pt will improve L shoulder strength to >/= 4+/5 in all planes to assist with functional lifting and carrying activities with </= 2/10 pain (11/22/2015)   Time 8   Period Weeks   Status Unable to assess     PT LONG TERM GOAL #4   Title pt will be able to lift and carry >/= 8# at shoulder height and/or higher with </= 2/10 pain to assist with reaching overhead shelfs and function lifting and lowering activities  (11/22/2015)   Baseline able to lift 8# but continues to have 5/10 pain   Time 8   Period Weeks   Status Partially Met     PT LONG TERM GOAL #5   Title At discharge pt will improve his FOTO score to >/= 66 to demonstrate improvement in function (11/22/2015)   Baseline 51 latest   Time 8   Period Weeks   Status Partially Met               Plan - 12/14/15 1047    Clinical Impression Statement Pt reports recent hospitalization due to sickle cell disease. He returns to PT with a MD order to continue PT. We reviewed neck stretches with pt unable to recall scalenes stretch or levator stretches. Added PNF pattern per PT plan lst visit. Began RTC strengthening with good tolerance. no additional goals met due to recent hospitalization.    PT Next Visit Plan assess response to DN,review stretches, add scalenes stretch, scap stab strength, review D1/D2 as HEP, rotator cuff strengthening      Patient will benefit from skilled therapeutic intervention in order to improve the following deficits and impairments:  Pain, Improper body mechanics, Postural dysfunction, Decreased endurance, Hypomobility, Decreased strength, Decreased mobility, Increased fascial restricitons, Impaired UE functional use, Decreased activity tolerance, Decreased range of motion  Visit Diagnosis: Pain in left shoulder  Stiffness of left shoulder, not elsewhere classified  Muscle weakness (generalized)     Problem List Patient Active Problem List   Diagnosis Date Noted  . Acute respiratory failure ([place]) 12/27/2014  . Systemic inflammatory response syndrome (SIRS) ([place]) 12/27/2014  . Chronic pain 01/26/2014  . Anxiety disorder 02/06/2013  . 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 Avascular bone necrosis ([place]) 01/27/2013  . Fever 01/24/2013  . Encephalopathy, toxic 01/24/2013  . Acute respiratory failure with hypoxia ([place]) 01/23/2013  . Acute delirium 02/06/2012  . Dehydration 02/04/2012  . Constipation  09/13/2011  . Leukocytosis 09/11/2011  . Sickle cell anemia with crisis ([place]) 04/16/2011  . Pruritus 04/16/2011    Dorene Ar, PTA 12/14/2015, 11:23 AM  [facility] [address] [place], [place], [address] Phone: [phone]   Fax:  [phone]  Name: [name] MRN: [id number] Date of Birth: [birth year]

## 2015-12-21 ENCOUNTER — Ambulatory Visit: Payer: Medicare Other | Admitting: Physical Therapy

## 2015-12-21 DIAGNOSIS — M25612 Stiffness of left shoulder, not elsewhere classified: Secondary | ICD-10-CM | POA: Diagnosis not present

## 2015-12-21 DIAGNOSIS — M6281 Muscle weakness (generalized): Secondary | ICD-10-CM

## 2015-12-21 DIAGNOSIS — M25512 Pain in left shoulder: Secondary | ICD-10-CM | POA: Diagnosis not present

## 2015-12-21 NOTE — Patient Instructions (Signed)

## 2015-12-21 NOTE — Therapy (Signed)
[place], [place], [address] Phone: [phone]   Fax:  [phone]  Physical Therapy Treatment  Patient Details  Name: [name] MRN: [id number] Date of Birth: 04/15/1980 Referring Provider: Jacqualine Mau, MD ([place] Physician)  Encounter Date: 12/21/2015      PT End of Session - 12/21/15 1252    Visit Number 10   Number of Visits 16   Date for PT Re-Evaluation 01/17/16   PT Start Time 1020   PT Stop Time 1115   PT Time Calculation (min) 55 min   Activity Tolerance Patient tolerated treatment well   Behavior During Therapy [facility] for tasks assessed/performed      Past Medical History:  Diagnosis Date  . Sickle cell anemia (HCC)     Past Surgical History:  Procedure Laterality Date  . CHOLECYSTECTOMY      There were no vitals filed for this visit.      Subjective Assessment - 12/21/15 1022    Subjective past bad past few days pain has been very bad,  mid scapula.     Currently in Pain? Yes   Pain Score 5    Pain Location Neck   Pain Orientation Left   Pain Descriptors / Indicators Aching   Pain Radiating Towards down to shoulder   Aggravating Factors  weather, no reason at times   Pain Relieving Factors medication   Pain Score 6   Pain Location Shoulder  scapula   Pain Orientation Left   Pain Descriptors / Indicators Tightness  knot   Pain Frequency Constant   Aggravating Factors  lating on it wrong way   Pain Relieving Factors reaching                         [facility] Adult PT Treatment/Exercise - 12/21/15 0001      Self-Care   Self-Care ADL's;Posture     Shoulder Exercises: Supine   External Rotation 10 reps  2 sets,  blue   Other Supine Exercises D1/D2 with yellow bands,  10+ X  each  form improved with repitition.   Other Supine Exercises shoulder press X5, 5 seconds,  head press 5 X 5 seconds     Shoulder Exercises: Standing   External  Rotation 10 reps  AROM   Flexion AROM   ABduction AROM   Other Standing Exercises D1 -D2 PNF yellow theraband 2 x 10  tried, stopped due to pain uncrease.  less pain supine     Moist Heat Therapy   Number Minutes Moist Heat 10 Minutes   Moist Heat Location Shoulder;Cervical     Manual Therapy   Soft tissue mobilization instrument assist, in sidelyingPecs, scapular, periscapular, upper trap fascia work                PT Education - 12/21/15 1252    Education provided Yes   Education Details Posture   Person(s) Educated Patient   Methods Explanation;Demonstration;Verbal cues   Comprehension Verbalized understanding;Returned demonstration;Need further instruction          PT Short Term Goals - 12/21/15 1304      PT SHORT TERM GOAL #1   Title pt will be I with inital HEP (10/27/2015)   Time 4   Period Weeks   Status Achieved     PT SHORT TERM GOAL #2   Title pt will be able to verbalize and demonstrate proper posture and lifting/ carrying mechanics  to prevent and reduce neck and shoulder pain (10/27/2015)   Time 4   Period Weeks   Status Achieved     PT SHORT TERM GOAL #3   Title pt will demonstrate decreased upper trap spasm and surround muscle tightness to decrease pain to </= 4/10 pain and help promote proper scapulohumeral rhythm (10/27/2015)   Baseline 6/10  poor posture   Time 4   Period Weeks   Status On-going           PT Long Term Goals - 12/21/15 1305      PT LONG TERM GOAL #1   Title pt will be I with all HEP as of last visit (11/22/15)   Time 8   Period Weeks     PT LONG TERM GOAL #2   Title pt will improve L shoulder abduction to >/= 140 degrees to promote functional lifting and carrying activities (11/22/2015)   Time 8   Period Weeks   Status Achieved     PT LONG TERM GOAL #3   Title pt will improve L shoulder strength to >/= 4+/5 in all planes to assist with functional lifting and carrying activities with </= 2/10 pain (11/22/2015)    Time 8   Period Weeks   Status Unable to assess     PT LONG TERM GOAL #4   Title pt will be able to lift and carry >/= 8# at shoulder height and/or higher with </= 2/10 pain to assist with reaching overhead shelfs and function lifting and lowering activities (11/22/2015)   Time 8   Status Unable to assess     PT LONG TERM GOAL #5   Title At discharge pt will improve his FOTO score to >/= 66 to demonstrate improvement in function (11/22/2015)   Time 8   Period Weeks               Plan - 12/21/15 1253    Clinical Impression Statement pain decreased to 4/10 post ex/manual.  Patient was using poor form with standing exercises so changed them to hook lying. His pain should improve if works on his posture more.  No new goals met.  He has full left shoulder flexion, ER.   PT Next Visit Plan progress rotator cuff strengthening, stretches.MMT   PT Home Exercise Plan continue,  try some supine.    Consulted and Agree with Plan of Care Patient      Patient will benefit from skilled therapeutic intervention in order to improve the following deficits and impairments:  Pain, Improper body mechanics, Postural dysfunction, Decreased endurance, Hypomobility, Decreased strength, Decreased mobility, Increased fascial restricitons, Impaired UE functional use, Decreased activity tolerance, Decreased range of motion  Visit Diagnosis: Stiffness of left shoulder, not elsewhere classified  Muscle weakness (generalized)     Problem List Patient Active Problem List   Diagnosis Date Noted  . Acute respiratory failure ([place]) 12/27/2014  . Systemic inflammatory response syndrome (SIRS) ([place]) 12/27/2014  . Chronic pain 01/26/2014  . Anxiety disorder 02/06/2013  . 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 Avascular bone necrosis ([place]) 01/27/2013  . Fever 01/24/2013  . Encephalopathy, toxic 01/24/2013  . Acute respiratory failure with hypoxia ([place]) 01/23/2013  . Acute delirium 02/06/2012  .  Dehydration 02/04/2012  . Constipation 09/13/2011  . Leukocytosis 09/11/2011  . Sickle cell anemia with crisis ([place]) 04/16/2011  . Pruritus 04/16/2011    HARRIS,KAREN 12/21/2015, 1:07 PM  [facility] [place],  [place], [address] Phone: [phone]   Fax:  [phone]  Name: [name] MRN: [id number] Date of Birth: Apr 06, 1981   Melvenia Needles, PTA 12/21/15 1:08 PM Phone: [phone] Fax: [phone]

## 2015-12-22 ENCOUNTER — Other Ambulatory Visit: Payer: Self-pay

## 2015-12-22 DIAGNOSIS — D5700 Hb-SS disease with crisis, unspecified: Secondary | ICD-10-CM

## 2015-12-22 DIAGNOSIS — G4701 Insomnia due to medical condition: Secondary | ICD-10-CM

## 2015-12-22 DIAGNOSIS — Z0000 Encounter for general adult medical examination without abnormal findings: Secondary | ICD-10-CM

## 2015-12-22 DIAGNOSIS — F064 Anxiety disorder due to known physiological condition: Secondary | ICD-10-CM

## 2015-12-22 MED ORDER — ALPRAZOLAM 1 MG PO TABS: 1.0000 mg | ORAL_TABLET | Freq: Three times a day (TID) | ORAL | 0 refills | Status: DC | PRN

## 2015-12-22 MED ORDER — OXYCODONE HCL ER 80 MG PO T12A: 80.0000 mg | EXTENDED_RELEASE_TABLET | Freq: Two times a day (BID) | ORAL | 0 refills | Status: DC

## 2015-12-22 MED ORDER — OXYCODONE HCL 30 MG PO TABS: 30.0000 mg | ORAL_TABLET | Freq: Four times a day (QID) | ORAL | 0 refills | Status: DC | PRN

## 2015-12-23 MED FILL — OxyCONTIN 80 MG T12A: 80 | 30 days supply | Qty: 60 | Fill #0

## 2015-12-23 MED FILL — oxyCODONE HCL 30 MG TABS: 30 | 30 days supply | Qty: 120 | Fill #0

## 2015-12-23 MED FILL — ALPRAZolam 1 MG TABS: 1 | 30 days supply | Qty: 90 | Fill #0

## 2015-12-28 ENCOUNTER — Ambulatory Visit: Payer: Medicare Other | Admitting: Physical Therapy

## 2015-12-28 DIAGNOSIS — M6281 Muscle weakness (generalized): Secondary | ICD-10-CM | POA: Diagnosis not present

## 2015-12-28 DIAGNOSIS — M25512 Pain in left shoulder: Secondary | ICD-10-CM | POA: Diagnosis not present

## 2015-12-28 DIAGNOSIS — M25612 Stiffness of left shoulder, not elsewhere classified: Secondary | ICD-10-CM

## 2015-12-28 NOTE — Therapy (Signed)
[facility] [address] [place], [place], [address] Phone: [phone]   Fax:  [phone]  Physical Therapy Treatment  Patient Details  Name: [name] PleasureYaw N Kleine MRN: [id number] Date of Birth: 06-Jun-1980 Referring Provider: Magdalene Patriciayan Leonard Freeman, MD ([facility])  Encounter Date: 12/28/2015      PT End of Session - 12/28/15 1034    Visit Number 11   Number of Visits 16   Date for PT Re-Evaluation 01/17/16   Authorization Type Medicare: Kx mod by 15th visit, and progres by the 16th visit.    PT Start Time 1030  11 minutes late   PT Stop Time 1115   PT Time Calculation (min) 45 min      Past Medical History:  Diagnosis Date  . Sickle cell anemia (HCC)     Past Surgical History:  Procedure Laterality Date  . CHOLECYSTECTOMY      There were no vitals filed for this visit.      Subjective Assessment - 12/28/15 1031    Currently in Pain? Yes   Pain Score 4    Pain Location --  upper trap, left scapula   Pain Orientation Left   Pain Descriptors / Indicators Aching   Aggravating Factors  lay on left side, or prone lying with hand under head   Pain Relieving Factors positioning                         [facility] Adult PT Treatment/Exercise - 12/28/15 0001      Shoulder Exercises: Sidelying   External Rotation Left;10 reps  2 sets   External Rotation Weight (lbs) 1     Shoulder Exercises: Standing   Horizontal ABduction Strengthening;Both;20 reps;Theraband   External Rotation Strengthening;Both;20 reps;Theraband   Extension 20 reps;Theraband   Theraband Level (Shoulder Extension) Level 3 (Green)   Row AROM;Strengthening;Both;Theraband;20 reps   Theraband Level (Shoulder Row) Level 3 (Green)     Shoulder Exercises: ROM/Strengthening   UBE (Upper Arm Bike) L2 x 6 min   Other ROM/Strengthening Exercises Prone over black physioball T, Bent T and Y 10 x 2 each- max cues tactil fro bent T   Other  ROM/Strengthening Exercises lat stretch, rhomboid stretch, corner stretch 60 seconds each      Moist Heat Therapy   Number Minutes Moist Heat 15 Minutes   Moist Heat Location Shoulder;Cervical     Manual Therapy   Manual therapy comments Scapular mobs and soft tissue to left upper trap, left peri scap                   PT Short Term Goals - 12/21/15 1304      PT SHORT TERM GOAL #1   Title pt will be I with inital HEP (10/27/2015)   Time 4   Period Weeks   Status Achieved     PT SHORT TERM GOAL #2   Title pt will be able to verbalize and demonstrate proper posture and lifting/ carrying mechanics to prevent and reduce neck and shoulder pain (10/27/2015)   Time 4   Period Weeks   Status Achieved     PT SHORT TERM GOAL #3   Title pt will demonstrate decreased upper trap spasm and surround muscle tightness to decrease pain to </= 4/10 pain and help promote proper scapulohumeral rhythm (10/27/2015)   Baseline 6/10  poor posture   Time 4   Period Weeks   Status On-going  PT Long Term Goals - 12/21/15 1305      PT LONG TERM GOAL #1   Title pt will be I with all HEP as of last visit (11/22/15)   Time 8   Period Weeks     PT LONG TERM GOAL #2   Title pt will improve L shoulder abduction to >/= 140 degrees to promote functional lifting and carrying activities (11/22/2015)   Time 8   Period Weeks   Status Achieved     PT LONG TERM GOAL #3   Title pt will improve L shoulder strength to >/= 4+/5 in all planes to assist with functional lifting and carrying activities with </= 2/10 pain (11/22/2015)   Time 8   Period Weeks   Status Unable to assess     PT LONG TERM GOAL #4   Title pt will be able to lift and carry >/= 8# at shoulder height and/or higher with </= 2/10 pain to assist with reaching overhead shelfs and function lifting and lowering activities (11/22/2015)   Time 8   Status Unable to assess     PT LONG TERM GOAL #5   Title At discharge pt will  improve his FOTO score to >/= 66 to demonstrate improvement in function (11/22/2015)   Time 8   Period Weeks               Plan - 12/28/15 1113    Clinical Impression Statement Pain continues to be 4/10. Focused scap and RTC strength. Attempted to decrease compensation with ER on left. Sidelying 1# ER with tactile cues, fatigues quickly. Soreness reported after therex.   PT Next Visit Plan progress rotator cuff strengthening, stretches; dry needle next visit; ? ERO vs DC soon.       Patient will benefit from skilled therapeutic intervention in order to improve the following deficits and impairments:  Pain, Improper body mechanics, Postural dysfunction, Decreased endurance, Hypomobility, Decreased strength, Decreased mobility, Increased fascial restricitons, Impaired UE functional use, Decreased activity tolerance, Decreased range of motion  Visit Diagnosis: Stiffness of left shoulder, not elsewhere classified  Muscle weakness (generalized)  Pain in left shoulder     Problem List Patient Active Problem List   Diagnosis Date Noted  . Acute respiratory failure (HCC) 12/27/2014  . Systemic inflammatory response syndrome (SIRS) (HCC) 12/27/2014  . Chronic pain 01/26/2014  . Anxiety disorder 02/06/2013  . 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 Avascular bone necrosis (HCC) 01/27/2013  . Fever 01/24/2013  . Encephalopathy, toxic 01/24/2013  . Acute respiratory failure with hypoxia (HCC) 01/23/2013  . Acute delirium 02/06/2012  . Dehydration 02/04/2012  . Constipation 09/13/2011  . Leukocytosis 09/11/2011  . Sickle cell anemia with crisis (HCC) 04/16/2011  . Pruritus 04/16/2011    Sherrie Mustache, PTA 12/28/2015, 11:30 AM  [facility] [address] [place], [place], [address] Phone: [phone]   Fax:  [phone]  Name: BERK PILOT MRN: [id number] Date of Birth: 1980-12-07

## 2015-12-29 DIAGNOSIS — F4310 Post-traumatic stress disorder, unspecified: Secondary | ICD-10-CM | POA: Diagnosis not present

## 2016-01-04 ENCOUNTER — Ambulatory Visit: Payer: Medicare Other | Admitting: Physical Therapy

## 2016-01-04 DIAGNOSIS — M25612 Stiffness of left shoulder, not elsewhere classified: Secondary | ICD-10-CM | POA: Diagnosis not present

## 2016-01-04 DIAGNOSIS — M25512 Pain in left shoulder: Secondary | ICD-10-CM

## 2016-01-04 DIAGNOSIS — M6281 Muscle weakness (generalized): Secondary | ICD-10-CM

## 2016-01-04 NOTE — Therapy (Signed)
[facility] [address] [place], [place], [address] Phone: [phone]   Fax:  [phone]  Physical Therapy Treatment  Patient Details  Name: [name] MRN: [id number] Date of Birth: 09/17/1980 Referring Provider: Magdalene Patricia, MD ([facility])  Encounter Date: 01/04/2016      PT End of Session - 01/04/16 1129    Visit Number 12   Number of Visits 16   Date for PT Re-Evaluation 01/17/16   Authorization Type Medicare: Kx mod by 15th visit, and progres by the 16th visit.    PT Start Time 1017   PT Stop Time 1112   PT Time Calculation (min) 55 min   Activity Tolerance Patient tolerated treatment well   Behavior During Therapy WFL for tasks assessed/performed      Past Medical History:  Diagnosis Date  . Sickle cell anemia (HCC)     Past Surgical History:  Procedure Laterality Date  . CHOLECYSTECTOMY      There were no vitals filed for this visit.      Subjective Assessment - 01/04/16 1021    Subjective "things are going good, the shoulder is coming along pretty well, good and bad days but more good than bad"    Currently in Pain? Yes   Pain Score 4    Pain Orientation Left   Pain Type Chronic pain   Pain Onset More than a month ago   Pain Frequency Constant   Aggravating Factors  weather related.    Pain Relieving Factors stretching, exercises                         [facility] Adult PT Treatment/Exercise - 01/04/16 0001      Moist Heat Therapy   Number Minutes Moist Heat 10 Minutes   Moist Heat Location Shoulder;Cervical     Manual Therapy   Soft tissue mobilization IASTM over the coracobrachialis   Kinesiotex Inhibit Muscle;Facilitate Muscle     Kinesiotix   Inhibit Muscle  coracobrachialis   Facilitate Muscle  bil rhomboids     Neck Exercises: Stretches   Upper Trapezius Stretch 2 reps;30 seconds   Levator Stretch 2 reps;30 seconds   Other Neck Stretches biceps  stretch 3 way x 2 holding for 30 sec           Trigger Point Dry Needling - 01/04/16 1125    Muscles Treated Upper Body --  coracobrachialis x 2 with pistoning              PT Education - 01/04/16 1128    Education provided Yes   Education Details reviewed and updated HEP with proper form. benefits of KT taping for inhibition and facilitation and after care with the tape.    Person(s) Educated Patient   Methods Explanation;Verbal cues;Handout   Comprehension Verbalized understanding;Verbal cues required          PT Short Term Goals - 12/21/15 1304      PT SHORT TERM GOAL #1   Title pt will be I with inital HEP (10/27/2015)   Time 4   Period Weeks   Status Achieved     PT SHORT TERM GOAL #2   Title pt will be able to verbalize and demonstrate proper posture and lifting/ carrying mechanics to prevent and reduce neck and shoulder pain (10/27/2015)   Time 4   Period Weeks   Status Achieved     PT SHORT TERM GOAL #3  Title pt will demonstrate decreased upper trap spasm and surround muscle tightness to decrease pain to </= 4/10 pain and help promote proper scapulohumeral rhythm (10/27/2015)   Baseline 6/10  poor posture   Time 4   Period Weeks   Status On-going           PT Long Term Goals - 12/21/15 1305      PT LONG TERM GOAL #1   Title pt will be I with all HEP as of last visit (11/22/15)   Time 8   Period Weeks     PT LONG TERM GOAL #2   Title pt will improve L shoulder abduction to >/= 140 degrees to promote functional lifting and carrying activities (11/22/2015)   Time 8   Period Weeks   Status Achieved     PT LONG TERM GOAL #3   Title pt will improve L shoulder strength to >/= 4+/5 in all planes to assist with functional lifting and carrying activities with </= 2/10 pain (11/22/2015)   Time 8   Period Weeks   Status Unable to assess     PT LONG TERM GOAL #4   Title pt will be able to lift and carry >/= 8# at shoulder height and/or higher with </=  2/10 pain to assist with reaching overhead shelfs and function lifting and lowering activities (11/22/2015)   Time 8   Status Unable to assess     PT LONG TERM GOAL #5   Title At discharge pt will improve his FOTO score to >/= 66 to demonstrate improvement in function (11/22/2015)   Time 8   Period Weeks               Plan - 01/04/16 1129    Clinical Impression Statement pt continues to report pain in the anterior aspect of the R shoulder. pt demonstrated pain at the coracobrachialis, DN was performed on coracobrachilais pt reported pain dropped to 3/10 follwing DN and IASTM. following KT taping for inhibition taping of the coracobrachialis, facilitation of bil rhomboids he reported pain dropped to 0/10.  Plan to work to D/C soon.    Rehab Potential Good   PT Next Visit Plan progress rotator cuff strengthening, stretches; dry needle next visit. assess response to KT tape/ DN.    PT Home Exercise Plan bicep stretch.    Consulted and Agree with Plan of Care Patient      Patient will benefit from skilled therapeutic intervention in order to improve the following deficits and impairments:  Pain, Improper body mechanics, Postural dysfunction, Decreased endurance, Hypomobility, Decreased strength, Decreased mobility, Increased fascial restricitons, Impaired UE functional use, Decreased activity tolerance, Decreased range of motion  Visit Diagnosis: Stiffness of left shoulder, not elsewhere classified  Muscle weakness (generalized)  Pain in left shoulder     Problem List Patient Active Problem List   Diagnosis Date Noted  . Acute respiratory failure (HCC) 12/27/2014  . Systemic inflammatory response syndrome (SIRS) (HCC) 12/27/2014  . Chronic pain 01/26/2014  . Anxiety disorder 02/06/2013  . 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 Avascular bone necrosis (HCC) 01/27/2013  . Fever 01/24/2013  . Encephalopathy, toxic 01/24/2013  . Acute respiratory failure with hypoxia  (HCC) 01/23/2013  . Acute delirium 02/06/2012  . Dehydration 02/04/2012  . Constipation 09/13/2011  . Leukocytosis 09/11/2011  . Sickle cell anemia with crisis (HCC) 04/16/2011  . Pruritus 04/16/2011   Lulu Riding PT, DPT, LAT, ATC  01/04/16  11:37 AM  [facility] [address] [place], [place], [address] Phone: [phone]   Fax:  [phone]  Name: Kerrie PleasureYaw N Chiou MRN: [id number] Date of Birth: 1981/03/18

## 2016-01-12 ENCOUNTER — Ambulatory Visit: Payer: Medicare Other | Attending: Family Medicine | Admitting: Physical Therapy

## 2016-01-12 DIAGNOSIS — M25612 Stiffness of left shoulder, not elsewhere classified: Secondary | ICD-10-CM | POA: Diagnosis not present

## 2016-01-12 DIAGNOSIS — M25512 Pain in left shoulder: Secondary | ICD-10-CM

## 2016-01-12 DIAGNOSIS — M6281 Muscle weakness (generalized): Secondary | ICD-10-CM

## 2016-01-12 NOTE — Therapy (Signed)
[place], [place], [address] Phone: [phone]   Fax:  [phone]  Physical Therapy Treatment / Discharge Note  Patient Details  Name: [name] MRN: [id number] Date of Birth: 1980-04-19 Referring Provider: Jacqualine Mau, MD ([facility]ician)  Encounter Date: 01/12/2016      PT End of Session - 01/12/16 1125    Visit Number 13   Number of Visits 16   Date for PT Re-Evaluation 01/17/16   Authorization Type Medicare: Kx mod by 15th visit, and progres by the 16th visit.    PT Start Time 1108  pt arrived 8 minutes late, shortened visit due to discharge   PT Stop Time 1140   PT Time Calculation (min) 32 min   Activity Tolerance Patient tolerated treatment well   Behavior During Therapy WFL for tasks assessed/performed      Past Medical History:  Diagnosis Date  . Sickle cell anemia (HCC)     Past Surgical History:  Procedure Laterality Date  . CHOLECYSTECTOMY      There were no vitals filed for this visit.      Subjective Assessment - 01/12/16 1110    Subjective "I am feeling pretty good"   Currently in Pain? Yes   Pain Score 3    Pain Location Neck   Pain Orientation Left   Pain Descriptors / Indicators Sore   Pain Type Chronic pain   Pain Onset More than a month ago   Pain Frequency Intermittent   Aggravating Factors  weather changes,    Pain Relieving Factors stretching/ exercises            [facility] PT Assessment - 01/12/16 0001      Observation/Other Assessments   Focus on Therapeutic Outcomes (FOTO)  24% limited     AROM   Left Shoulder Flexion 160 Degrees   Left Shoulder ABduction 140 Degrees   Left Shoulder Internal Rotation 40 Degrees   Left Shoulder External Rotation 88 Degrees   Cervical Flexion 66   Cervical Extension 64   Cervical - Right Side Bend 44   Cervical - Left Side Bend 48   Cervical - Right Rotation 75   Cervical - Left Rotation 68     Strength   Left Shoulder Flexion 4+/5   Left Shoulder Extension 5/5   Left Shoulder ABduction 4+/5   Left Shoulder Internal Rotation 5/5   Left Shoulder External Rotation 4+/5                             PT Education - 01/12/16 1156    Education provided Yes   Education Details reviewed HEP given previously and how to progress by increasing reps/ sets. pt started working out at [employer], discussed gradually workin into doing more, being mindful of pain and muscle soreness.  and discussed his progress with PT.    Person(s) Educated Patient   Methods Explanation;Verbal cues   Comprehension Verbalized understanding;Verbal cues required          PT Short Term Goals - 01/12/16 1130      PT SHORT TERM GOAL #1   Title pt will be I with inital HEP (10/27/2015)   Time 4   Period Weeks   Status Achieved     PT SHORT TERM GOAL #2   Title pt will be able to verbalize and demonstrate proper posture and lifting/ carrying mechanics to prevent and  reduce neck and shoulder pain (10/27/2015)   Time 4   Period Weeks   Status Achieved     PT SHORT TERM GOAL #3   Title pt will demonstrate decreased upper trap spasm and surround muscle tightness to decrease pain to </= 4/10 pain and help promote proper scapulohumeral rhythm (10/27/2015)   Time 4   Period Weeks   Status Achieved           PT Long Term Goals - 01/29/16 1131      PT LONG TERM GOAL #1   Title pt will be I with all HEP as of last visit (11/22/15)   Time 8   Period Weeks   Status Achieved     PT LONG TERM GOAL #2   Title pt will improve L shoulder abduction to >/= 140 degrees to promote functional lifting and carrying activities (11/22/2015)   Time 8   Period Weeks   Status Achieved     PT LONG TERM GOAL #3   Title pt will improve L shoulder strength to >/= 4+/5 in all planes to assist with functional lifting and carrying activities with </= 2/10 pain (11/22/2015)   Baseline 3/10 pain    Time 8    Period Weeks   Status Partially Met     PT LONG TERM GOAL #4   Title pt will be able to lift and carry >/= 8# at shoulder height and/or higher with </= 2/10 pain to assist with reaching overhead shelfs and function lifting and lowering activities (11/22/2015)   Period Weeks   Status Achieved     PT LONG TERM GOAL #5   Title At discharge pt will improve his FOTO score to >/= 66 to demonstrate improvement in function (11/22/2015)   Baseline 76   Time 8   Period Weeks   Status Achieved               Plan - 2016-01-29 1153    Clinical Impression Statement Mr. Whitford has made great progress with physcial therapy improving both cervical and L shoulder mobility, and reduced pain. He continues to have soreness in the anterior aspect of the shoulder and intermittent L upper trap pain reported at 3/10. He states today is his last day and that he has moved to [facility] and plans to see if he can start PT there. pt will be discharged from PT today.    PT Next Visit Plan DN   Consulted and Agree with Plan of Care Patient      Patient will benefit from skilled therapeutic intervention in order to improve the following deficits and impairments:  Pain, Improper body mechanics, Postural dysfunction, Decreased endurance, Hypomobility, Decreased strength, Decreased mobility, Increased fascial restricitons, Impaired UE functional use, Decreased activity tolerance, Decreased range of motion  Visit Diagnosis: Stiffness of left shoulder, not elsewhere classified  Muscle weakness (generalized)  Left shoulder pain, unspecified chronicity       G-Codes - 2016-01-29 1152    Functional Assessment Tool Used FOTO/ Clinical judgement   Functional Limitation Carrying, moving and handling objects   Carrying, Moving and Handling Objects Goal Status ([id number]) At least 20 percent but less than 40 percent impaired, limited or restricted   Carrying, Moving and Handling Objects Discharge Status [phone]) At least 20  percent but less than 40 percent impaired, limited or restricted      Problem List Patient Active Problem List   Diagnosis Date Noted  . Acute respiratory failure ([place]) 12/27/2014  .  Systemic inflammatory response syndrome (SIRS) ([place]) 12/27/2014  . Chronic pain 01/26/2014  . Anxiety disorder 02/06/2013  . Hb-SS disease with crisis ([place]) 01/28/2013  . Avascular bone necrosis ([place]) 01/27/2013  . Fever 01/24/2013  . Encephalopathy, toxic 01/24/2013  . Acute respiratory failure with hypoxia ([place]) 01/23/2013  . Acute delirium 02/06/2012  . Dehydration 02/04/2012  . Constipation 09/13/2011  . Leukocytosis 09/11/2011  . Sickle cell anemia with crisis ([place]) 04/16/2011  . Pruritus 04/16/2011   Starr Lake PT, DPT, LAT, ATC  01/12/16  11:59 AM      [facility] [address] [place], [place], [address] Phone: [phone]   Fax:  [phone]  Name: [name] MRN: [id number] Date of Birth: October 22, 1980   PHYSICAL THERAPY DISCHARGE SUMMARY  Visits from Start of Care: 13  Current functional level related to goals / functional outcomes: FOTO 24% limited   Remaining deficits: Intermittent L upper trap tightness, soreness in the coracobrachialis that is worse with lifting. Limited L shoulder abduction/ internal rotation.    Education / Equipment: HEP, posture, theraband for strengthening,   Plan: Patient agrees to discharge.  Patient goals were partially met. Patient is being discharged due to being pleased with the current functional level.   Pt reports he plans to see if he can get in with a PT in [place].

## 2016-01-16 ENCOUNTER — Other Ambulatory Visit: Payer: Medicare Other

## 2016-01-16 ENCOUNTER — Ambulatory Visit: Payer: Medicare Other | Admitting: Hematology & Oncology

## 2016-01-18 ENCOUNTER — Encounter: Payer: Medicare Other | Admitting: Physical Therapy

## 2016-01-19 ENCOUNTER — Other Ambulatory Visit (HOSPITAL_BASED_OUTPATIENT_CLINIC_OR_DEPARTMENT_OTHER): Payer: Medicare Other

## 2016-01-19 ENCOUNTER — Ambulatory Visit: Payer: Medicare Other

## 2016-01-19 ENCOUNTER — Encounter: Payer: Self-pay | Admitting: Hematology & Oncology

## 2016-01-19 ENCOUNTER — Ambulatory Visit (HOSPITAL_BASED_OUTPATIENT_CLINIC_OR_DEPARTMENT_OTHER): Payer: Medicare Other | Admitting: Hematology & Oncology

## 2016-01-19 VITALS — BP 135/69 | HR 80 | Temp 98.4°F | Resp 16 | Ht 66.0 in | Wt 171.0 lb

## 2016-01-19 DIAGNOSIS — Z23 Encounter for immunization: Secondary | ICD-10-CM

## 2016-01-19 DIAGNOSIS — D571 Sickle-cell disease without crisis: Secondary | ICD-10-CM | POA: Diagnosis not present

## 2016-01-19 DIAGNOSIS — G4701 Insomnia due to medical condition: Secondary | ICD-10-CM

## 2016-01-19 DIAGNOSIS — F064 Anxiety disorder due to known physiological condition: Secondary | ICD-10-CM

## 2016-01-19 DIAGNOSIS — D5700 Hb-SS disease with crisis, unspecified: Secondary | ICD-10-CM

## 2016-01-19 DIAGNOSIS — Z0000 Encounter for general adult medical examination without abnormal findings: Secondary | ICD-10-CM

## 2016-01-19 LAB — CBC WITH DIFFERENTIAL (CANCER CENTER ONLY)
BASO#: 0.1 10*3/uL (ref 0.0–0.2)
BASO%: 0.5 % (ref 0.0–2.0)
EOS ABS: 0.1 10*3/uL (ref 0.0–0.5)
EOS%: 0.8 % (ref 0.0–7.0)
HEMATOCRIT: 35.3 % — AB (ref 38.7–49.9)
HGB: 12.6 g/dL — ABNORMAL LOW (ref 13.0–17.1)
LYMPH#: 2.2 10*3/uL (ref 0.9–3.3)
LYMPH%: 22.1 % (ref 14.0–48.0)
MCH: 32.1 pg (ref 28.0–33.4)
MCHC: 35.7 g/dL (ref 32.0–35.9)
MCV: 90 fL (ref 82–98)
MONO#: 1 10*3/uL — AB (ref 0.1–0.9)
MONO%: 10.4 % (ref 0.0–13.0)
NEUT#: 6.5 10*3/uL (ref 1.5–6.5)
NEUT%: 66.2 % (ref 40.0–80.0)
PLATELETS: 381 10*3/uL (ref 145–400)
RBC: 3.92 10*6/uL — ABNORMAL LOW (ref 4.20–5.70)
RDW: 17.4 % — AB (ref 11.1–15.7)
WBC: 9.8 10*3/uL (ref 4.0–10.0)

## 2016-01-19 LAB — CMP (CANCER CENTER ONLY)
ALBUMIN: 4.2 g/dL (ref 3.3–5.5)
ALT(SGPT): 31 U/L (ref 10–47)
AST: 45 U/L — AB (ref 11–38)
Alkaline Phosphatase: 87 U/L — ABNORMAL HIGH (ref 26–84)
BILIRUBIN TOTAL: 2.3 mg/dL — AB (ref 0.20–1.60)
BUN: 11 mg/dL (ref 7–22)
CHLORIDE: 104 meq/L (ref 98–108)
CO2: 25 meq/L (ref 18–33)
CREATININE: 1.1 mg/dL (ref 0.6–1.2)
Calcium: 9.5 mg/dL (ref 8.0–10.3)
GLUCOSE: 113 mg/dL (ref 73–118)
Potassium: 3.8 mEq/L (ref 3.3–4.7)
SODIUM: 137 meq/L (ref 128–145)
Total Protein: 7.5 g/dL (ref 6.4–8.1)

## 2016-01-19 LAB — TECHNOLOGIST REVIEW CHCC SATELLITE: Tech Review: 2

## 2016-01-19 MED ORDER — OXYCODONE HCL ER 80 MG PO T12A: 80.0000 mg | EXTENDED_RELEASE_TABLET | Freq: Two times a day (BID) | ORAL | 0 refills | Status: DC

## 2016-01-19 MED ORDER — GABAPENTIN 400 MG PO CAPS: 400.0000 mg | ORAL_CAPSULE | Freq: Three times a day (TID) | ORAL | 11 refills | Status: DC

## 2016-01-19 MED ORDER — ALPRAZOLAM 1 MG PO TABS: 1.0000 mg | ORAL_TABLET | Freq: Three times a day (TID) | ORAL | 0 refills | Status: DC | PRN

## 2016-01-19 MED ORDER — INFLUENZA VAC SPLIT QUAD 0.5 ML IM SUSY
0.5000 mL | PREFILLED_SYRINGE | Freq: Once | INTRAMUSCULAR | Status: AC
  Administered 2016-01-19: 0.5 mL via INTRAMUSCULAR
  Filled 2016-01-19: qty 0.5

## 2016-01-19 MED ORDER — PROMETHAZINE HCL 25 MG PO TABS: 25.0000 mg | ORAL_TABLET | Freq: Four times a day (QID) | ORAL | 4 refills | Status: DC | PRN

## 2016-01-19 MED ORDER — OXYCODONE HCL 30 MG PO TABS: 30.0000 mg | ORAL_TABLET | Freq: Four times a day (QID) | ORAL | 0 refills | Status: DC | PRN

## 2016-01-19 NOTE — Patient Instructions (Signed)
No phlebotomy due to hct  35.3 per  Dr. Myna HidalgoEnnever order.
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
## 2016-01-19 NOTE — Patient Instructions (Signed)

## 2016-01-20 LAB — IRON AND TIBC
%SAT: 23 % (ref 20–55)
Iron: 73 ug/dL (ref 42–163)
TIBC: 323 ug/dL (ref 202–409)
UIBC: 250 ug/dL (ref 117–376)

## 2016-01-20 LAB — FERRITIN: Ferritin: 143 ng/ml (ref 22–316)

## 2016-01-20 LAB — RETICULOCYTES: RETICULOCYTE COUNT: 11 % — AB (ref 0.6–2.6)

## 2016-01-23 ENCOUNTER — Other Ambulatory Visit: Payer: Self-pay | Admitting: Radiology

## 2016-01-23 LAB — HEMOGLOBINOPATHY EVALUATION
HGB C: 0 %
HGB S: 60.5 % — AB
Hemoglobin A2 Quantitation: 4 % — ABNORMAL HIGH (ref 0.7–3.1)
Hemoglobin F Quantitation: 7.2 % — ABNORMAL HIGH (ref 0.0–2.0)
Hgb A: 28.3 % — ABNORMAL LOW (ref 94.0–98.0)

## 2016-01-24 ENCOUNTER — Other Ambulatory Visit: Payer: Self-pay | Admitting: Radiology

## 2016-01-25 ENCOUNTER — Ambulatory Visit (HOSPITAL_COMMUNITY)
Admission: RE | Admit: 2016-01-25 | Discharge: 2016-01-25 | Disposition: A | Discharge status: Home / Self Care | Payer: Medicare Other | Source: Ambulatory Visit | Attending: Hematology & Oncology | Admitting: Hematology & Oncology

## 2016-01-25 ENCOUNTER — Encounter: Payer: Medicare Other | Admitting: Physical Therapy

## 2016-01-25 ENCOUNTER — Other Ambulatory Visit: Payer: Self-pay | Admitting: Hematology & Oncology

## 2016-01-25 ENCOUNTER — Encounter (HOSPITAL_COMMUNITY): Payer: Self-pay

## 2016-01-25 DIAGNOSIS — G4701 Insomnia due to medical condition: Secondary | ICD-10-CM

## 2016-01-25 DIAGNOSIS — Z23 Encounter for immunization: Secondary | ICD-10-CM

## 2016-01-25 DIAGNOSIS — Z885 Allergy status to narcotic agent status: Secondary | ICD-10-CM | POA: Insufficient documentation

## 2016-01-25 DIAGNOSIS — D5700 Hb-SS disease with crisis, unspecified: Secondary | ICD-10-CM

## 2016-01-25 DIAGNOSIS — F064 Anxiety disorder due to known physiological condition: Secondary | ICD-10-CM

## 2016-01-25 DIAGNOSIS — Z452 Encounter for adjustment and management of vascular access device: Secondary | ICD-10-CM | POA: Diagnosis not present

## 2016-01-25 DIAGNOSIS — Z0000 Encounter for general adult medical examination without abnormal findings: Secondary | ICD-10-CM

## 2016-01-25 DIAGNOSIS — D57219 Sickle-cell/Hb-C disease with crisis, unspecified: Secondary | ICD-10-CM | POA: Insufficient documentation

## 2016-01-25 HISTORY — PX: IR GENERIC HISTORICAL: IMG1180011

## 2016-01-25 LAB — CBC WITH DIFFERENTIAL/PLATELET
BASOS ABS: 0.1 10*3/uL (ref 0.0–0.1)
Basophils Relative: 1 %
EOS ABS: 0.1 10*3/uL (ref 0.0–0.7)
Eosinophils Relative: 1 %
HEMATOCRIT: 35.7 % — AB (ref 39.0–52.0)
Hemoglobin: 12.3 g/dL — ABNORMAL LOW (ref 13.0–17.0)
LYMPHS ABS: 1.8 10*3/uL (ref 0.7–4.0)
Lymphocytes Relative: 24 %
MCH: 31 pg (ref 26.0–34.0)
MCHC: 34.5 g/dL (ref 30.0–36.0)
MCV: 89.9 fL (ref 78.0–100.0)
MONO ABS: 0.8 10*3/uL (ref 0.1–1.0)
Monocytes Relative: 11 %
NEUTROS PCT: 63 %
Neutro Abs: 4.7 10*3/uL (ref 1.7–7.7)
PLATELETS: 403 10*3/uL — AB (ref 150–400)
RBC: 3.97 MIL/uL — AB (ref 4.22–5.81)
RDW: 18 % — AB (ref 11.5–15.5)
WBC: 7.5 10*3/uL (ref 4.0–10.5)

## 2016-01-25 LAB — PROTIME-INR
INR: 1.05
Prothrombin Time: 13.8 seconds (ref 11.4–15.2)

## 2016-01-25 MED ORDER — CEFAZOLIN SODIUM-DEXTROSE 2-4 GM/100ML-% IV SOLN
2.0000 g | INTRAVENOUS | Status: AC
  Administered 2016-01-25: 2 g via INTRAVENOUS
  Filled 2016-01-25: qty 100

## 2016-01-25 MED ORDER — MIDAZOLAM HCL 2 MG/2ML IJ SOLN
INTRAMUSCULAR | Status: AC
  Filled 2016-01-25: qty 8

## 2016-01-25 MED ORDER — HEPARIN SOD (PORK) LOCK FLUSH 100 UNIT/ML IV SOLN
INTRAVENOUS | Status: AC
  Filled 2016-01-25: qty 5

## 2016-01-25 MED ORDER — MIDAZOLAM HCL 2 MG/2ML IJ SOLN
INTRAMUSCULAR | Status: AC | PRN
  Administered 2016-01-25 (×4): 1 mg via INTRAVENOUS

## 2016-01-25 MED ORDER — FENTANYL CITRATE (PF) 100 MCG/2ML IJ SOLN
INTRAMUSCULAR | Status: AC
  Filled 2016-01-25: qty 4

## 2016-01-25 MED ORDER — HEPARIN SOD (PORK) LOCK FLUSH 100 UNIT/ML IV SOLN
INTRAVENOUS | Status: AC | PRN
  Administered 2016-01-25: 500 [IU]

## 2016-01-25 MED ORDER — SODIUM CHLORIDE 0.9 % IV SOLN
INTRAVENOUS | Status: DC
  Administered 2016-01-25: 12:00:00 via INTRAVENOUS

## 2016-01-25 MED ORDER — FENTANYL CITRATE (PF) 100 MCG/2ML IJ SOLN
INTRAMUSCULAR | Status: AC | PRN
  Administered 2016-01-25: 25 ug via INTRAVENOUS
  Administered 2016-01-25: 50 ug via INTRAVENOUS
  Administered 2016-01-25: 25 ug via INTRAVENOUS

## 2016-01-25 MED ORDER — LIDOCAINE HCL 1 % IJ SOLN
INTRAMUSCULAR | Status: AC
  Administered 2016-01-25: 15 mL
  Filled 2016-01-25: qty 20
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
## 2016-01-25 NOTE — Progress Notes (Signed)
Left message at Dr. Gustavo LahEnnever's office to confirm plan for port flushes. Requested they call patient back. Patient received port today and is aware he will need to confirm port flush dates/location with MD office. Patient and father aware. Discharged to home.

## 2016-01-25 NOTE — Procedures (Signed)
Interventional Radiology Procedure Note  Procedure: Placement of a right IJ approach single lumen PowerPort.  Tip is positioned at the superior cavoatrial junction and catheter is ready for immediate use.  Complications: No immediate Recommendations:  - Ok to shower tomorrow - Do not submerge for 7 days - Routine line care   Rylyn Zawistowski T. Linas Stepter, M.D Pager:  [phone]   

## 2016-01-25 NOTE — Discharge Instructions (Signed)

## 2016-02-22 ENCOUNTER — Other Ambulatory Visit: Payer: Self-pay | Admitting: *Deleted

## 2016-02-22 DIAGNOSIS — F064 Anxiety disorder due to known physiological condition: Secondary | ICD-10-CM

## 2016-02-22 DIAGNOSIS — D5700 Hb-SS disease with crisis, unspecified: Secondary | ICD-10-CM

## 2016-02-22 DIAGNOSIS — G4701 Insomnia due to medical condition: Secondary | ICD-10-CM

## 2016-02-22 DIAGNOSIS — Z0000 Encounter for general adult medical examination without abnormal findings: Secondary | ICD-10-CM

## 2016-02-22 DIAGNOSIS — Z23 Encounter for immunization: Secondary | ICD-10-CM

## 2016-02-22 MED ORDER — ALPRAZOLAM 1 MG PO TABS: 1.0000 mg | ORAL_TABLET | Freq: Three times a day (TID) | ORAL | 0 refills | Status: DC | PRN

## 2016-02-22 MED ORDER — OXYCODONE HCL ER 80 MG PO T12A: 80.0000 mg | EXTENDED_RELEASE_TABLET | Freq: Two times a day (BID) | ORAL | 0 refills | Status: DC

## 2016-02-22 MED ORDER — OXYCODONE HCL 30 MG PO TABS: 30.0000 mg | ORAL_TABLET | Freq: Four times a day (QID) | ORAL | 0 refills | Status: DC | PRN

## 2016-02-23 MED FILL — OxyCONTIN 80 MG T12A: 80 | 30 days supply | Qty: 60 | Fill #0

## 2016-02-23 MED FILL — ALPRAZolam 1 MG TABS: 1 | 30 days supply | Qty: 90 | Fill #0

## 2016-02-23 MED FILL — oxyCODONE HCL 30 MG TABS: 30 | 30 days supply | Qty: 120 | Fill #0

## 2016-03-15 DIAGNOSIS — D5701 Hb-SS disease with acute chest syndrome: Secondary | ICD-10-CM | POA: Diagnosis not present

## 2016-03-15 DIAGNOSIS — I2699 Other pulmonary embolism without acute cor pulmonale: Secondary | ICD-10-CM | POA: Diagnosis not present

## 2016-03-15 DIAGNOSIS — M545 Low back pain: Secondary | ICD-10-CM | POA: Diagnosis not present

## 2016-03-15 DIAGNOSIS — R042 Hemoptysis: Secondary | ICD-10-CM | POA: Diagnosis not present

## 2016-03-15 DIAGNOSIS — D5700 Hb-SS disease with crisis, unspecified: Secondary | ICD-10-CM | POA: Diagnosis not present

## 2016-03-15 DIAGNOSIS — R918 Other nonspecific abnormal finding of lung field: Secondary | ICD-10-CM | POA: Diagnosis not present

## 2016-03-15 DIAGNOSIS — R0902 Hypoxemia: Secondary | ICD-10-CM | POA: Diagnosis not present

## 2016-03-15 DIAGNOSIS — R17 Unspecified jaundice: Secondary | ICD-10-CM | POA: Diagnosis not present

## 2016-03-15 DIAGNOSIS — R0789 Other chest pain: Secondary | ICD-10-CM | POA: Diagnosis not present

## 2016-03-15 DIAGNOSIS — T82838A Hemorrhage of vascular prosthetic devices, implants and grafts, initial encounter: Secondary | ICD-10-CM | POA: Diagnosis not present

## 2016-03-15 DIAGNOSIS — J9601 Acute respiratory failure with hypoxia: Secondary | ICD-10-CM | POA: Diagnosis not present

## 2016-03-17 DIAGNOSIS — D5701 Hb-SS disease with acute chest syndrome: Secondary | ICD-10-CM | POA: Diagnosis not present

## 2016-03-17 DIAGNOSIS — I2699 Other pulmonary embolism without acute cor pulmonale: Secondary | ICD-10-CM | POA: Diagnosis not present

## 2016-03-17 DIAGNOSIS — R918 Other nonspecific abnormal finding of lung field: Secondary | ICD-10-CM | POA: Diagnosis not present

## 2016-03-17 DIAGNOSIS — D5700 Hb-SS disease with crisis, unspecified: Secondary | ICD-10-CM | POA: Diagnosis not present

## 2016-03-17 DIAGNOSIS — R0902 Hypoxemia: Secondary | ICD-10-CM | POA: Diagnosis not present

## 2016-03-18 DIAGNOSIS — F4310 Post-traumatic stress disorder, unspecified: Secondary | ICD-10-CM | POA: Diagnosis present

## 2016-03-18 DIAGNOSIS — R791 Abnormal coagulation profile: Secondary | ICD-10-CM | POA: Diagnosis not present

## 2016-03-18 DIAGNOSIS — D5700 Hb-SS disease with crisis, unspecified: Secondary | ICD-10-CM | POA: Diagnosis not present

## 2016-03-18 DIAGNOSIS — T82838A Hemorrhage of vascular prosthetic devices, implants and grafts, initial encounter: Secondary | ICD-10-CM | POA: Diagnosis not present

## 2016-03-18 DIAGNOSIS — L299 Pruritus, unspecified: Secondary | ICD-10-CM | POA: Diagnosis present

## 2016-03-18 DIAGNOSIS — D5701 Hb-SS disease with acute chest syndrome: Secondary | ICD-10-CM | POA: Diagnosis not present

## 2016-03-18 DIAGNOSIS — J029 Acute pharyngitis, unspecified: Secondary | ICD-10-CM | POA: Diagnosis not present

## 2016-03-18 DIAGNOSIS — Z7982 Long term (current) use of aspirin: Secondary | ICD-10-CM | POA: Diagnosis not present

## 2016-03-18 DIAGNOSIS — R042 Hemoptysis: Secondary | ICD-10-CM | POA: Diagnosis not present

## 2016-03-18 DIAGNOSIS — I2699 Other pulmonary embolism without acute cor pulmonale: Secondary | ICD-10-CM | POA: Diagnosis not present

## 2016-03-18 DIAGNOSIS — E876 Hypokalemia: Secondary | ICD-10-CM | POA: Diagnosis not present

## 2016-03-18 DIAGNOSIS — R17 Unspecified jaundice: Secondary | ICD-10-CM | POA: Diagnosis present

## 2016-03-18 DIAGNOSIS — F419 Anxiety disorder, unspecified: Secondary | ICD-10-CM | POA: Diagnosis present

## 2016-03-18 DIAGNOSIS — G8929 Other chronic pain: Secondary | ICD-10-CM | POA: Diagnosis present

## 2016-03-18 DIAGNOSIS — Z79899 Other long term (current) drug therapy: Secondary | ICD-10-CM | POA: Diagnosis not present

## 2016-03-18 DIAGNOSIS — R040 Epistaxis: Secondary | ICD-10-CM | POA: Diagnosis not present

## 2016-03-18 DIAGNOSIS — R918 Other nonspecific abnormal finding of lung field: Secondary | ICD-10-CM | POA: Diagnosis not present

## 2016-03-18 DIAGNOSIS — D72829 Elevated white blood cell count, unspecified: Secondary | ICD-10-CM | POA: Diagnosis present

## 2016-03-18 DIAGNOSIS — D571 Sickle-cell disease without crisis: Secondary | ICD-10-CM | POA: Diagnosis not present

## 2016-03-18 DIAGNOSIS — J9811 Atelectasis: Secondary | ICD-10-CM | POA: Diagnosis not present

## 2016-03-18 DIAGNOSIS — J9601 Acute respiratory failure with hypoxia: Secondary | ICD-10-CM | POA: Diagnosis not present

## 2016-03-22 ENCOUNTER — Other Ambulatory Visit: Payer: Self-pay | Admitting: *Deleted

## 2016-03-22 DIAGNOSIS — D5700 Hb-SS disease with crisis, unspecified: Secondary | ICD-10-CM

## 2016-03-22 DIAGNOSIS — F064 Anxiety disorder due to known physiological condition: Secondary | ICD-10-CM

## 2016-03-22 DIAGNOSIS — G4701 Insomnia due to medical condition: Secondary | ICD-10-CM

## 2016-03-22 DIAGNOSIS — Z0000 Encounter for general adult medical examination without abnormal findings: Secondary | ICD-10-CM

## 2016-03-22 DIAGNOSIS — Z23 Encounter for immunization: Secondary | ICD-10-CM

## 2016-03-22 MED ORDER — OXYCODONE HCL 30 MG PO TABS: 30.0000 mg | ORAL_TABLET | Freq: Four times a day (QID) | ORAL | 0 refills | Status: DC | PRN

## 2016-03-22 MED ORDER — ALPRAZOLAM 1 MG PO TABS: 1.0000 mg | ORAL_TABLET | Freq: Three times a day (TID) | ORAL | 0 refills | Status: DC | PRN

## 2016-03-22 MED ORDER — OXYCODONE HCL ER 80 MG PO T12A: 80.0000 mg | EXTENDED_RELEASE_TABLET | Freq: Two times a day (BID) | ORAL | 0 refills | Status: DC

## 2016-03-26 ENCOUNTER — Other Ambulatory Visit (HOSPITAL_BASED_OUTPATIENT_CLINIC_OR_DEPARTMENT_OTHER): Payer: Medicare Other

## 2016-03-26 ENCOUNTER — Ambulatory Visit (HOSPITAL_BASED_OUTPATIENT_CLINIC_OR_DEPARTMENT_OTHER): Payer: Medicare Other

## 2016-03-26 ENCOUNTER — Ambulatory Visit (HOSPITAL_BASED_OUTPATIENT_CLINIC_OR_DEPARTMENT_OTHER): Payer: Medicare Other | Admitting: Hematology & Oncology

## 2016-03-26 VITALS — BP 122/74 | HR 66 | Temp 98.6°F | Wt 171.8 lb

## 2016-03-26 DIAGNOSIS — G4701 Insomnia due to medical condition: Secondary | ICD-10-CM

## 2016-03-26 DIAGNOSIS — F064 Anxiety disorder due to known physiological condition: Secondary | ICD-10-CM

## 2016-03-26 DIAGNOSIS — D5700 Hb-SS disease with crisis, unspecified: Secondary | ICD-10-CM | POA: Diagnosis not present

## 2016-03-26 DIAGNOSIS — Z0000 Encounter for general adult medical examination without abnormal findings: Secondary | ICD-10-CM

## 2016-03-26 DIAGNOSIS — I2699 Other pulmonary embolism without acute cor pulmonale: Secondary | ICD-10-CM

## 2016-03-26 DIAGNOSIS — I2601 Septic pulmonary embolism with acute cor pulmonale: Secondary | ICD-10-CM

## 2016-03-26 DIAGNOSIS — Z23 Encounter for immunization: Secondary | ICD-10-CM

## 2016-03-26 LAB — MANUAL DIFFERENTIAL (CHCC SATELLITE)
ALC: 1.6 10*3/uL (ref 0.9–3.3)
ANC (CHCC HP manual diff): 1.9 10*3/uL (ref 1.5–6.5)
BASO: 1 % (ref 0–2)
Eos: 2 % (ref 0–7)
LYMPH: 39 % (ref 14–48)
MONO: 11 % (ref 0–13)
PLT EST ~~LOC~~: INCREASED
SEG: 47 % (ref 40–75)
nRBC: 61 % — ABNORMAL HIGH (ref 0–0)

## 2016-03-26 LAB — CMP (CANCER CENTER ONLY)
ALT(SGPT): 23 U/L (ref 10–47)
AST: 37 U/L (ref 11–38)
Albumin: 3.8 g/dL (ref 3.3–5.5)
Alkaline Phosphatase: 96 U/L — ABNORMAL HIGH (ref 26–84)
BUN, Bld: 9 mg/dL (ref 7–22)
CALCIUM: 9.3 mg/dL (ref 8.0–10.3)
CO2: 26 meq/L (ref 18–33)
Chloride: 109 mEq/L — ABNORMAL HIGH (ref 98–108)
Creat: 0.8 mg/dl (ref 0.6–1.2)
GLUCOSE: 99 mg/dL (ref 73–118)
POTASSIUM: 3.8 meq/L (ref 3.3–4.7)
Sodium: 141 mEq/L (ref 128–145)
Total Bilirubin: 1.5 mg/dl (ref 0.20–1.60)
Total Protein: 7.6 g/dL (ref 6.4–8.1)

## 2016-03-26 LAB — IRON AND TIBC
%SAT: 15 % — ABNORMAL LOW (ref 20–55)
Iron: 52 ug/dL (ref 42–163)
TIBC: 344 ug/dL (ref 202–409)
UIBC: 291 ug/dL (ref 117–376)

## 2016-03-26 LAB — CBC WITH DIFFERENTIAL (CANCER CENTER ONLY)
HEMATOCRIT: 33.4 % — AB (ref 38.7–49.9)
HGB: 11.3 g/dL — ABNORMAL LOW (ref 13.0–17.1)
MCH: 31.1 pg (ref 28.0–33.4)
MCHC: 33.8 g/dL (ref 32.0–35.9)
MCV: 92 fL (ref 82–98)
Platelets: 429 10*3/uL — ABNORMAL HIGH (ref 145–400)
RBC: 3.63 10*6/uL — ABNORMAL LOW (ref 4.20–5.70)
RDW: 21.5 % — AB (ref 11.1–15.7)
WBC: 4.1 10*3/uL (ref 4.0–10.0)

## 2016-03-26 LAB — FERRITIN: Ferritin: 84 ng/ml (ref 22–316)

## 2016-03-26 MED ORDER — SODIUM CHLORIDE 0.9% FLUSH
10.0000 mL | INTRAVENOUS | Status: DC | PRN
  Administered 2016-03-26: 10 mL via INTRAVENOUS
  Filled 2016-03-26: qty 10

## 2016-03-26 MED ORDER — HEPARIN SOD (PORK) LOCK FLUSH 100 UNIT/ML IV SOLN
500.0000 [IU] | Freq: Once | INTRAVENOUS | Status: AC
  Administered 2016-03-26: 500 [IU] via INTRAVENOUS
  Filled 2016-03-26: qty 5

## 2016-03-26 MED ORDER — RIVAROXABAN 20 MG PO TABS: 20.0000 mg | ORAL_TABLET | Freq: Every day | ORAL | 12 refills | Status: DC

## 2016-03-26 MED FILL — oxyCODONE HCL 30 MG TABS: 30 | 30 days supply | Qty: 120 | Fill #0

## 2016-03-26 MED FILL — ALPRAZolam 1 MG TABS: 1 | 30 days supply | Qty: 90 | Fill #0

## 2016-03-26 MED FILL — OxyCONTIN 80 MG T12A: 80 | 30 days supply | Qty: 60 | Fill #0

## 2016-03-26 NOTE — Progress Notes (Signed)
[name] presented for Portacath access and flush. Proper placement of portacath confirmed by CXR. Portacath located in the right chest wall accessed with  H 20 needle. Clean, Dry and Intact Good blood return present. Portacath flushed with 20ml NS and 500U/115ml Heparin per protocol and needle removed intact. Procedure without incident. Patient tolerated procedure well.

## 2016-03-27 LAB — RETICULOCYTES: RETICULOCYTE COUNT: 9.5 % — AB (ref 0.6–2.6)

## 2016-03-27 LAB — HEMOGLOBINOPATHY EVALUATION
HGB A: 58.3 % — AB (ref 96.4–98.8)
HGB C: 0 %
HGB S: 35 % — AB
Hemoglobin A2 Quantitation: 3.5 % — ABNORMAL HIGH (ref 1.8–3.2)
Hemoglobin F Quantitation: 3.2 % — ABNORMAL HIGH (ref 0.0–2.0)
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
## 2016-04-19 ENCOUNTER — Ambulatory Visit: Payer: Medicare Other | Admitting: Hematology & Oncology

## 2016-04-19 ENCOUNTER — Other Ambulatory Visit: Payer: Medicare Other

## 2016-04-25 ENCOUNTER — Other Ambulatory Visit: Payer: Self-pay | Admitting: *Deleted

## 2016-04-25 DIAGNOSIS — G4701 Insomnia due to medical condition: Secondary | ICD-10-CM

## 2016-04-25 DIAGNOSIS — Z23 Encounter for immunization: Secondary | ICD-10-CM

## 2016-04-25 DIAGNOSIS — F064 Anxiety disorder due to known physiological condition: Secondary | ICD-10-CM

## 2016-04-25 DIAGNOSIS — D5700 Hb-SS disease with crisis, unspecified: Secondary | ICD-10-CM

## 2016-04-25 DIAGNOSIS — Z0000 Encounter for general adult medical examination without abnormal findings: Secondary | ICD-10-CM

## 2016-04-25 MED ORDER — TRAZODONE HCL 100 MG PO TABS: 100.0000 mg | ORAL_TABLET | Freq: Every evening | ORAL | 3 refills | Status: AC | PRN

## 2016-04-25 MED ORDER — ALPRAZOLAM 1 MG PO TABS: 1.0000 mg | ORAL_TABLET | Freq: Three times a day (TID) | ORAL | 0 refills | Status: DC | PRN

## 2016-04-25 MED ORDER — OXYCODONE HCL 30 MG PO TABS: 30.0000 mg | ORAL_TABLET | Freq: Four times a day (QID) | ORAL | 0 refills | Status: DC | PRN

## 2016-04-25 MED ORDER — OXYCODONE HCL ER 80 MG PO T12A: 80.0000 mg | EXTENDED_RELEASE_TABLET | Freq: Two times a day (BID) | ORAL | 0 refills | Status: DC

## 2016-04-27 MED FILL — traZODone HCL 100 MG TABS: 100 | 30 days supply | Qty: 30 | Fill #0

## 2016-04-27 MED FILL — ALPRAZolam 1 MG TABS: 1 | 30 days supply | Qty: 90 | Fill #0

## 2016-04-27 MED FILL — oxyCODONE HCL 30 MG TABS: 30 | 30 days supply | Qty: 120 | Fill #0

## 2016-04-27 MED FILL — OxyCONTIN 80 MG T12A: 80 | 30 days supply | Qty: 60 | Fill #0

## 2016-05-04 DIAGNOSIS — Z79899 Other long term (current) drug therapy: Secondary | ICD-10-CM | POA: Diagnosis not present

## 2016-05-04 DIAGNOSIS — R509 Fever, unspecified: Secondary | ICD-10-CM | POA: Diagnosis not present

## 2016-05-04 DIAGNOSIS — R112 Nausea with vomiting, unspecified: Secondary | ICD-10-CM | POA: Diagnosis not present

## 2016-05-04 DIAGNOSIS — R1110 Vomiting, unspecified: Secondary | ICD-10-CM | POA: Diagnosis not present

## 2016-05-04 DIAGNOSIS — R109 Unspecified abdominal pain: Secondary | ICD-10-CM | POA: Diagnosis not present

## 2016-05-04 DIAGNOSIS — D571 Sickle-cell disease without crisis: Secondary | ICD-10-CM | POA: Diagnosis not present

## 2016-05-04 DIAGNOSIS — R0602 Shortness of breath: Secondary | ICD-10-CM | POA: Diagnosis not present

## 2016-05-04 DIAGNOSIS — R51 Headache: Secondary | ICD-10-CM | POA: Diagnosis not present

## 2016-05-04 DIAGNOSIS — Z5181 Encounter for therapeutic drug level monitoring: Secondary | ICD-10-CM | POA: Diagnosis not present

## 2016-05-04 DIAGNOSIS — R6889 Other general symptoms and signs: Secondary | ICD-10-CM | POA: Diagnosis not present

## 2016-05-10 ENCOUNTER — Other Ambulatory Visit (HOSPITAL_BASED_OUTPATIENT_CLINIC_OR_DEPARTMENT_OTHER): Payer: Medicare HMO

## 2016-05-10 ENCOUNTER — Ambulatory Visit (HOSPITAL_BASED_OUTPATIENT_CLINIC_OR_DEPARTMENT_OTHER): Payer: Medicare HMO | Admitting: Hematology & Oncology

## 2016-05-10 ENCOUNTER — Ambulatory Visit: Payer: Medicare Other

## 2016-05-10 DIAGNOSIS — Z7901 Long term (current) use of anticoagulants: Secondary | ICD-10-CM | POA: Diagnosis not present

## 2016-05-10 DIAGNOSIS — D5700 Hb-SS disease with crisis, unspecified: Secondary | ICD-10-CM | POA: Diagnosis not present

## 2016-05-10 DIAGNOSIS — F064 Anxiety disorder due to known physiological condition: Secondary | ICD-10-CM

## 2016-05-10 DIAGNOSIS — I2601 Septic pulmonary embolism with acute cor pulmonale: Secondary | ICD-10-CM | POA: Diagnosis not present

## 2016-05-10 DIAGNOSIS — Z0000 Encounter for general adult medical examination without abnormal findings: Secondary | ICD-10-CM

## 2016-05-10 DIAGNOSIS — Z23 Encounter for immunization: Secondary | ICD-10-CM

## 2016-05-10 DIAGNOSIS — I2699 Other pulmonary embolism without acute cor pulmonale: Secondary | ICD-10-CM

## 2016-05-10 DIAGNOSIS — G4701 Insomnia due to medical condition: Secondary | ICD-10-CM

## 2016-05-10 LAB — FERRITIN: Ferritin: 149 ng/mL (ref 22–316)

## 2016-05-10 LAB — CMP (CANCER CENTER ONLY)
ALBUMIN: 3.8 g/dL (ref 3.3–5.5)
ALT(SGPT): 33 U/L (ref 10–47)
AST: 45 U/L — ABNORMAL HIGH (ref 11–38)
Alkaline Phosphatase: 105 U/L — ABNORMAL HIGH (ref 26–84)
BUN, Bld: 5 mg/dL — ABNORMAL LOW (ref 7–22)
CALCIUM: 9.2 mg/dL (ref 8.0–10.3)
CHLORIDE: 108 meq/L (ref 98–108)
CO2: 26 mEq/L (ref 18–33)
CREATININE: 0.8 mg/dL (ref 0.6–1.2)
Glucose, Bld: 88 mg/dL (ref 73–118)
Potassium: 3.8 mEq/L (ref 3.3–4.7)
SODIUM: 141 meq/L (ref 128–145)
TOTAL PROTEIN: 7 g/dL (ref 6.4–8.1)
Total Bilirubin: 2.1 mg/dl — ABNORMAL HIGH (ref 0.20–1.60)

## 2016-05-10 LAB — CBC WITH DIFFERENTIAL (CANCER CENTER ONLY)
BASO#: 0.1 10*3/uL (ref 0.0–0.2)
BASO%: 0.7 % (ref 0.0–2.0)
EOS ABS: 0.3 10*3/uL (ref 0.0–0.5)
EOS%: 2.7 % (ref 0.0–7.0)
HCT: 31 % — ABNORMAL LOW (ref 38.7–49.9)
HGB: 10.8 g/dL — ABNORMAL LOW (ref 13.0–17.1)
LYMPH#: 2.9 10*3/uL (ref 0.9–3.3)
LYMPH%: 30.3 % (ref 14.0–48.0)
MCH: 30.3 pg (ref 28.0–33.4)
MCHC: 34.8 g/dL (ref 32.0–35.9)
MCV: 87 fL (ref 82–98)
MONO#: 1.3 10*3/uL — ABNORMAL HIGH (ref 0.1–0.9)
MONO%: 13.1 % — AB (ref 0.0–13.0)
NEUT#: 5.1 10*3/uL (ref 1.5–6.5)
NEUT%: 53.2 % (ref 40.0–80.0)
PLATELETS: 338 10*3/uL (ref 145–400)
RBC: 3.57 10*6/uL — ABNORMAL LOW (ref 4.20–5.70)
RDW: 19.9 % — ABNORMAL HIGH (ref 11.1–15.7)
WBC: 9.5 10*3/uL (ref 4.0–10.0)

## 2016-05-10 LAB — IRON AND TIBC
%SAT: 20 % (ref 20–55)
Iron: 63 ug/dL (ref 42–163)
TIBC: 320 ug/dL (ref 202–409)
UIBC: 257 ug/dL (ref 117–376)

## 2016-05-10 LAB — TECHNOLOGIST REVIEW CHCC SATELLITE

## 2016-05-10 MED ORDER — OXYCODONE HCL 30 MG PO TABS: 30.0000 mg | ORAL_TABLET | Freq: Four times a day (QID) | ORAL | 0 refills | Status: DC | PRN

## 2016-05-10 MED ORDER — HEPARIN SOD (PORK) LOCK FLUSH 100 UNIT/ML IV SOLN
500.0000 [IU] | Freq: Once | INTRAVENOUS | Status: AC
  Administered 2016-05-10: 500 [IU] via INTRAVENOUS
  Filled 2016-05-10: qty 5

## 2016-05-10 MED ORDER — OXYCODONE HCL ER 80 MG PO T12A
80.0000 mg | EXTENDED_RELEASE_TABLET | Freq: Two times a day (BID) | ORAL | 0 refills | Status: DC
Start: 2016-05-10 — End: 2016-06-28

## 2016-05-10 MED ORDER — SODIUM CHLORIDE 0.9% FLUSH
10.0000 mL | INTRAVENOUS | Status: DC | PRN
  Administered 2016-05-10: 10 mL via INTRAVENOUS
  Filled 2016-05-10: qty 10

## 2016-05-10 MED ORDER — ALPRAZOLAM 1 MG PO TABS: 1.0000 mg | ORAL_TABLET | Freq: Three times a day (TID) | ORAL | 0 refills | Status: DC | PRN
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
## 2016-05-10 NOTE — Patient Instructions (Signed)

## 2016-05-11 LAB — RETICULOCYTES: RETICULOCYTE COUNT: 10.8 % — AB (ref 0.6–2.6)

## 2016-06-28 ENCOUNTER — Ambulatory Visit (HOSPITAL_BASED_OUTPATIENT_CLINIC_OR_DEPARTMENT_OTHER): Payer: Medicare HMO | Admitting: Hematology & Oncology

## 2016-06-28 ENCOUNTER — Encounter: Payer: Self-pay | Admitting: Hematology & Oncology

## 2016-06-28 ENCOUNTER — Ambulatory Visit (HOSPITAL_BASED_OUTPATIENT_CLINIC_OR_DEPARTMENT_OTHER): Admission: RE | Admit: 2016-06-28 | Payer: Medicare HMO | Source: Ambulatory Visit

## 2016-06-28 ENCOUNTER — Other Ambulatory Visit (HOSPITAL_BASED_OUTPATIENT_CLINIC_OR_DEPARTMENT_OTHER): Payer: Medicare HMO

## 2016-06-28 ENCOUNTER — Ambulatory Visit (HOSPITAL_BASED_OUTPATIENT_CLINIC_OR_DEPARTMENT_OTHER): Payer: Medicare HMO

## 2016-06-28 DIAGNOSIS — D5700 Hb-SS disease with crisis, unspecified: Secondary | ICD-10-CM

## 2016-06-28 DIAGNOSIS — Z23 Encounter for immunization: Secondary | ICD-10-CM | POA: Diagnosis not present

## 2016-06-28 DIAGNOSIS — G4701 Insomnia due to medical condition: Secondary | ICD-10-CM | POA: Diagnosis not present

## 2016-06-28 DIAGNOSIS — F064 Anxiety disorder due to known physiological condition: Secondary | ICD-10-CM

## 2016-06-28 DIAGNOSIS — D571 Sickle-cell disease without crisis: Secondary | ICD-10-CM

## 2016-06-28 DIAGNOSIS — Z0000 Encounter for general adult medical examination without abnormal findings: Secondary | ICD-10-CM | POA: Diagnosis not present

## 2016-06-28 DIAGNOSIS — R69 Illness, unspecified: Secondary | ICD-10-CM | POA: Diagnosis not present

## 2016-06-28 DIAGNOSIS — I2699 Other pulmonary embolism without acute cor pulmonale: Secondary | ICD-10-CM | POA: Diagnosis not present

## 2016-06-28 DIAGNOSIS — Z95828 Presence of other vascular implants and grafts: Secondary | ICD-10-CM

## 2016-06-28 LAB — CMP (CANCER CENTER ONLY)
ALBUMIN: 4.3 g/dL (ref 3.3–5.5)
ALK PHOS: 82 U/L (ref 26–84)
ALT: 40 U/L (ref 10–47)
AST: 72 U/L — ABNORMAL HIGH (ref 11–38)
BUN: 8 mg/dL (ref 7–22)
CO2: 24 mEq/L (ref 18–33)
CREATININE: 1.1 mg/dL (ref 0.6–1.2)
Calcium: 9.2 mg/dL (ref 8.0–10.3)
Chloride: 105 mEq/L (ref 98–108)
Glucose, Bld: 104 mg/dL (ref 73–118)
Potassium: 3.6 mEq/L (ref 3.3–4.7)
SODIUM: 140 meq/L (ref 128–145)
TOTAL PROTEIN: 7.4 g/dL (ref 6.4–8.1)
Total Bilirubin: 2.9 mg/dl — ABNORMAL HIGH (ref 0.20–1.60)

## 2016-06-28 LAB — CBC WITH DIFFERENTIAL (CANCER CENTER ONLY)
BASO#: 0.1 10*3/uL (ref 0.0–0.2)
BASO%: 1 % (ref 0.0–2.0)
EOS%: 0.8 % (ref 0.0–7.0)
Eosinophils Absolute: 0.1 10*3/uL (ref 0.0–0.5)
HCT: 28.1 % — ABNORMAL LOW (ref 38.7–49.9)
HEMOGLOBIN: 10 g/dL — AB (ref 13.0–17.1)
LYMPH#: 2.8 10*3/uL (ref 0.9–3.3)
LYMPH%: 31.4 % (ref 14.0–48.0)
MCH: 30 pg (ref 28.0–33.4)
MCHC: 35.6 g/dL (ref 32.0–35.9)
MCV: 84 fL (ref 82–98)
MONO#: 1 10*3/uL — AB (ref 0.1–0.9)
MONO%: 11.3 % (ref 0.0–13.0)
NEUT#: 4.9 10*3/uL (ref 1.5–6.5)
NEUT%: 55.5 % (ref 40.0–80.0)
PLATELETS: 369 10*3/uL (ref 145–400)
RBC: 3.33 10*6/uL — AB (ref 4.20–5.70)
RDW: 23.4 % — ABNORMAL HIGH (ref 11.1–15.7)
WBC: 8.9 10*3/uL (ref 4.0–10.0)

## 2016-06-28 LAB — CHCC SATELLITE - SMEAR

## 2016-06-28 LAB — TECHNOLOGIST REVIEW CHCC SATELLITE: Tech Review: 3

## 2016-06-28 MED ORDER — OXYCODONE HCL ER 80 MG PO T12A: 80.0000 mg | EXTENDED_RELEASE_TABLET | Freq: Two times a day (BID) | ORAL | 0 refills | Status: DC

## 2016-06-28 MED ORDER — ALPRAZOLAM 1 MG PO TABS
1.0000 mg | ORAL_TABLET | Freq: Three times a day (TID) | ORAL | 0 refills | Status: DC | PRN
Start: 2016-06-28 — End: 2016-07-25

## 2016-06-28 MED ORDER — HEPARIN SOD (PORK) LOCK FLUSH 100 UNIT/ML IV SOLN
500.0000 [IU] | Freq: Once | INTRAVENOUS | Status: AC
  Administered 2016-06-28: 500 [IU] via INTRAVENOUS
  Filled 2016-06-28: qty 5

## 2016-06-28 MED ORDER — SODIUM CHLORIDE 0.9% FLUSH
10.0000 mL | INTRAVENOUS | Status: DC | PRN
  Administered 2016-06-28: 10 mL via INTRAVENOUS
  Filled 2016-06-28: qty 10

## 2016-06-28 MED ORDER — OXYCODONE HCL 30 MG PO TABS: 30.0000 mg | ORAL_TABLET | Freq: Four times a day (QID) | ORAL | 0 refills | Status: DC | PRN

## 2016-06-28 MED FILL — oxyCODONE HCL 30 MG TABS: 30 | 30 days supply | Qty: 120 | Fill #0

## 2016-06-28 MED FILL — ALPRAZolam 1 MG TABS: 1 | 30 days supply | Qty: 90 | Fill #0

## 2016-06-28 MED FILL — OxyCONTIN 80 MG T12A: 80 | 30 days supply | Qty: 60 | Fill #0
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
## 2016-06-29 LAB — IRON AND TIBC
%SAT: 17 % — AB (ref 20–55)
Iron: 58 ug/dL (ref 42–163)
TIBC: 336 ug/dL (ref 202–409)
UIBC: 278 ug/dL (ref 117–376)

## 2016-06-29 LAB — FERRITIN: Ferritin: 133 ng/ml (ref 22–316)

## 2016-07-03 LAB — HEMOGLOBINOPATHY EVALUATION
HEMOGLOBIN A2 QUANTITATION: 4 % — AB (ref 1.8–3.2)
HGB C: 0 %
HGB S: 78.7 % — AB
HGB VARIANT: 0 %
Hemoglobin F Quantitation: 7.3 % — ABNORMAL HIGH (ref 0.0–2.0)
Hgb A: 10 % — ABNORMAL LOW (ref 96.4–98.8)

## 2016-07-23 DIAGNOSIS — R110 Nausea: Secondary | ICD-10-CM | POA: Diagnosis not present

## 2016-07-23 DIAGNOSIS — Z7901 Long term (current) use of anticoagulants: Secondary | ICD-10-CM | POA: Diagnosis not present

## 2016-07-23 DIAGNOSIS — Z79899 Other long term (current) drug therapy: Secondary | ICD-10-CM | POA: Diagnosis not present

## 2016-07-23 DIAGNOSIS — Z79891 Long term (current) use of opiate analgesic: Secondary | ICD-10-CM | POA: Diagnosis not present

## 2016-07-23 DIAGNOSIS — Z6827 Body mass index (BMI) 27.0-27.9, adult: Secondary | ICD-10-CM | POA: Diagnosis not present

## 2016-07-23 DIAGNOSIS — D57819 Other sickle-cell disorders with crisis, unspecified: Secondary | ICD-10-CM | POA: Diagnosis not present

## 2016-07-23 DIAGNOSIS — R52 Pain, unspecified: Secondary | ICD-10-CM | POA: Diagnosis not present

## 2016-07-23 DIAGNOSIS — R69 Illness, unspecified: Secondary | ICD-10-CM | POA: Diagnosis not present

## 2016-07-23 DIAGNOSIS — Z0000 Encounter for general adult medical examination without abnormal findings: Secondary | ICD-10-CM | POA: Diagnosis not present

## 2016-07-25 ENCOUNTER — Other Ambulatory Visit: Payer: Self-pay | Admitting: *Deleted

## 2016-07-25 DIAGNOSIS — Z23 Encounter for immunization: Secondary | ICD-10-CM

## 2016-07-25 DIAGNOSIS — D5700 Hb-SS disease with crisis, unspecified: Secondary | ICD-10-CM

## 2016-07-25 DIAGNOSIS — G4701 Insomnia due to medical condition: Secondary | ICD-10-CM

## 2016-07-25 DIAGNOSIS — F064 Anxiety disorder due to known physiological condition: Secondary | ICD-10-CM

## 2016-07-25 DIAGNOSIS — Z0000 Encounter for general adult medical examination without abnormal findings: Secondary | ICD-10-CM

## 2016-07-25 MED ORDER — OXYCODONE HCL 30 MG PO TABS: 30.0000 mg | ORAL_TABLET | Freq: Four times a day (QID) | ORAL | 0 refills | Status: DC | PRN

## 2016-07-25 MED ORDER — OXYCODONE HCL ER 80 MG PO T12A: 80.0000 mg | EXTENDED_RELEASE_TABLET | Freq: Two times a day (BID) | ORAL | 0 refills | Status: DC

## 2016-07-25 MED ORDER — ALPRAZOLAM 1 MG PO TABS: 1.0000 mg | ORAL_TABLET | Freq: Three times a day (TID) | ORAL | 0 refills | Status: DC | PRN

## 2016-08-21 ENCOUNTER — Other Ambulatory Visit: Payer: Self-pay | Admitting: *Deleted

## 2016-08-21 DIAGNOSIS — D5700 Hb-SS disease with crisis, unspecified: Secondary | ICD-10-CM

## 2016-08-21 DIAGNOSIS — G4701 Insomnia due to medical condition: Secondary | ICD-10-CM

## 2016-08-21 DIAGNOSIS — Z23 Encounter for immunization: Secondary | ICD-10-CM

## 2016-08-21 DIAGNOSIS — F064 Anxiety disorder due to known physiological condition: Secondary | ICD-10-CM

## 2016-08-21 DIAGNOSIS — Z0000 Encounter for general adult medical examination without abnormal findings: Secondary | ICD-10-CM

## 2016-08-21 MED ORDER — OXYCODONE HCL ER 80 MG PO T12A: 80.0000 mg | EXTENDED_RELEASE_TABLET | Freq: Two times a day (BID) | ORAL | 0 refills | Status: DC

## 2016-08-21 MED ORDER — ALPRAZOLAM 1 MG PO TABS: 1.0000 mg | ORAL_TABLET | Freq: Three times a day (TID) | ORAL | 0 refills | Status: DC | PRN

## 2016-08-21 MED ORDER — OXYCODONE HCL 30 MG PO TABS
30.0000 mg | ORAL_TABLET | Freq: Four times a day (QID) | ORAL | 0 refills | Status: DC | PRN
Start: 2016-08-21 — End: 2016-09-28

## 2016-09-28 ENCOUNTER — Other Ambulatory Visit (HOSPITAL_BASED_OUTPATIENT_CLINIC_OR_DEPARTMENT_OTHER): Payer: Medicare HMO

## 2016-09-28 ENCOUNTER — Ambulatory Visit (HOSPITAL_BASED_OUTPATIENT_CLINIC_OR_DEPARTMENT_OTHER): Payer: Medicare HMO | Admitting: Hematology & Oncology

## 2016-09-28 ENCOUNTER — Ambulatory Visit: Payer: Medicare HMO

## 2016-09-28 DIAGNOSIS — D5700 Hb-SS disease with crisis, unspecified: Secondary | ICD-10-CM

## 2016-09-28 DIAGNOSIS — F064 Anxiety disorder due to known physiological condition: Secondary | ICD-10-CM

## 2016-09-28 DIAGNOSIS — D571 Sickle-cell disease without crisis: Secondary | ICD-10-CM | POA: Diagnosis not present

## 2016-09-28 DIAGNOSIS — Z7901 Long term (current) use of anticoagulants: Secondary | ICD-10-CM

## 2016-09-28 DIAGNOSIS — G4701 Insomnia due to medical condition: Secondary | ICD-10-CM

## 2016-09-28 DIAGNOSIS — Z23 Encounter for immunization: Secondary | ICD-10-CM

## 2016-09-28 DIAGNOSIS — I2699 Other pulmonary embolism without acute cor pulmonale: Secondary | ICD-10-CM

## 2016-09-28 DIAGNOSIS — Z0000 Encounter for general adult medical examination without abnormal findings: Secondary | ICD-10-CM

## 2016-09-28 LAB — CMP (CANCER CENTER ONLY)
ALBUMIN: 3.9 g/dL (ref 3.3–5.5)
ALT(SGPT): 48 U/L — ABNORMAL HIGH (ref 10–47)
AST: 46 U/L — AB (ref 11–38)
Alkaline Phosphatase: 81 U/L (ref 26–84)
BILIRUBIN TOTAL: 2 mg/dL — AB (ref 0.20–1.60)
BUN, Bld: 8 mg/dL (ref 7–22)
CHLORIDE: 108 meq/L (ref 98–108)
CO2: 24 meq/L (ref 18–33)
CREATININE: 1 mg/dL (ref 0.6–1.2)
Calcium: 9.5 mg/dL (ref 8.0–10.3)
GLUCOSE: 84 mg/dL (ref 73–118)
Potassium: 3.8 mEq/L (ref 3.3–4.7)
SODIUM: 139 meq/L (ref 128–145)
Total Protein: 7.6 g/dL (ref 6.4–8.1)

## 2016-09-28 LAB — TECHNOLOGIST REVIEW CHCC SATELLITE: Tech Review: 3

## 2016-09-28 LAB — CBC WITH DIFFERENTIAL (CANCER CENTER ONLY)
BASO#: 0.1 10*3/uL (ref 0.0–0.2)
BASO%: 0.9 % (ref 0.0–2.0)
EOS ABS: 0.1 10*3/uL (ref 0.0–0.5)
EOS%: 1.9 % (ref 0.0–7.0)
HCT: 30 % — ABNORMAL LOW (ref 38.7–49.9)
HEMOGLOBIN: 10.2 g/dL — AB (ref 13.0–17.1)
LYMPH#: 1.4 10*3/uL (ref 0.9–3.3)
LYMPH%: 22.3 % (ref 14.0–48.0)
MCH: 24.9 pg — AB (ref 28.0–33.4)
MCHC: 34 g/dL (ref 32.0–35.9)
MCV: 73 fL — AB (ref 82–98)
MONO#: 0.9 10*3/uL (ref 0.1–0.9)
MONO%: 13.5 % — ABNORMAL HIGH (ref 0.0–13.0)
NEUT%: 61.4 % (ref 40.0–80.0)
NEUTROS ABS: 3.9 10*3/uL (ref 1.5–6.5)
Platelets: 405 10*3/uL — ABNORMAL HIGH (ref 145–400)
RBC: 4.09 10*6/uL — AB (ref 4.20–5.70)
RDW: 21.3 % — ABNORMAL HIGH (ref 11.1–15.7)
WBC: 6.4 10*3/uL (ref 4.0–10.0)

## 2016-09-28 MED ORDER — OXYCODONE HCL 30 MG PO TABS
30.0000 mg | ORAL_TABLET | Freq: Four times a day (QID) | ORAL | 0 refills | Status: DC | PRN
Start: 2016-09-28 — End: 2016-10-25

## 2016-09-28 MED ORDER — ALPRAZOLAM 1 MG PO TABS: 1.0000 mg | ORAL_TABLET | Freq: Three times a day (TID) | ORAL | 0 refills | Status: DC | PRN

## 2016-09-28 MED ORDER — OXYCODONE HCL ER 80 MG PO T12A: 80.0000 mg | EXTENDED_RELEASE_TABLET | Freq: Two times a day (BID) | ORAL | 0 refills | Status: DC

## 2016-09-28 MED FILL — ALPRAZolam 1 MG TABS: 1 | 30 days supply | Qty: 90 | Fill #0

## 2016-09-28 MED FILL — oxyCODONE HCL 30 MG TABS: 30 | 30 days supply | Qty: 120 | Fill #0

## 2016-09-28 MED FILL — OxyCONTIN 80 MG T12A: 80 | 30 days supply | Qty: 60 | Fill #0

## 2016-09-28 NOTE — Patient Instructions (Signed)
Implanted Port Home Guide An implanted port is a type of central line that is placed under the skin. Central lines are used to provide IV access when treatment or nutrition needs to be given through a person's veins. Implanted ports are used for long-term IV access. An implanted port may be placed because:  You need IV medicine that would be irritating to the small veins in your hands or arms.  You need long-term IV medicines, such as antibiotics.  You need IV nutrition for a long period.  You need frequent blood draws for lab tests.  You need dialysis.  Implanted ports are usually placed in the chest area, but they can also be placed in the upper arm, the abdomen, or the leg. An implanted port has two main parts:  Reservoir. The reservoir is round and will appear as a small, raised area under your skin. The reservoir is the part where a needle is inserted to give medicines or draw blood.  Catheter. The catheter is a thin, flexible tube that extends from the reservoir. The catheter is placed into a large vein. Medicine that is inserted into the reservoir goes into the catheter and then into the vein.  How will I care for my incision site? Do not get the incision site wet. Bathe or shower as directed by your health care provider. How is my port accessed? Special steps must be taken to access the port:  Before the port is accessed, a numbing cream can be placed on the skin. This helps numb the skin over the port site.  Your health care provider uses a sterile technique to access the port. ? Your health care provider must put on a mask and sterile gloves. ? The skin over your port is cleaned carefully with an antiseptic and allowed to dry. ? The port is gently pinched between sterile gloves, and a needle is inserted into the port.  Only "non-coring" port needles should be used to access the port. Once the port is accessed, a blood return should be checked. This helps ensure that the port  is in the vein and is not clogged.  If your port needs to remain accessed for a constant infusion, a clear (transparent) bandage will be placed over the needle site. The bandage and needle will need to be changed every week, or as directed by your health care provider.  Keep the bandage covering the needle clean and dry. Do not get it wet. Follow your health care provider's instructions on how to take a shower or bath while the port is accessed.  If your port does not need to stay accessed, no bandage is needed over the port.  What is flushing? Flushing helps keep the port from getting clogged. Follow your health care provider's instructions on how and when to flush the port. Ports are usually flushed with saline solution or a medicine called heparin. The need for flushing will depend on how the port is used.  If the port is used for intermittent medicines or blood draws, the port will need to be flushed: ? After medicines have been given. ? After blood has been drawn. ? As part of routine maintenance.  If a constant infusion is running, the port may not need to be flushed.  How long will my port stay implanted? The port can stay in for as long as your health care provider thinks it is needed. When it is time for the port to come out, surgery will be   done to remove it. The procedure is similar to the one performed when the port was put in. When should I seek immediate medical care? When you have an implanted port, you should seek immediate medical care if:  You notice a bad smell coming from the incision site.  You have swelling, redness, or drainage at the incision site.  You have more swelling or pain at the port site or the surrounding area.  You have a fever that is not controlled with medicine.  This information is not intended to replace advice given to you by your health care provider. Make sure you discuss any questions you have with your health care provider. Document  Released: 03/26/2005 Document Revised: 09/01/2015 Document Reviewed: 12/01/2012 Elsevier Interactive Patient Education  2017 Elsevier Inc.  
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
## 2016-09-29 LAB — RETICULOCYTES: Reticulocyte Count: 6.4 % — ABNORMAL HIGH (ref 0.6–2.6)

## 2016-10-01 LAB — FERRITIN: Ferritin: 30 ng/ml (ref 22–316)

## 2016-10-01 LAB — IRON AND TIBC
%SAT: 9 % — ABNORMAL LOW (ref 20–55)
IRON: 37 ug/dL — AB (ref 42–163)
TIBC: 398 ug/dL (ref 202–409)
UIBC: 361 ug/dL (ref 117–376)

## 2016-10-02 LAB — HEMOGLOBINOPATHY EVALUATION
HEMOGLOBIN A2 QUANTITATION: 4.5 % — AB (ref 1.8–3.2)
HGB C: 0 %
HGB S: 89.4 % — ABNORMAL HIGH
HGB VARIANT: 0 %
Hemoglobin F Quantitation: 6.1 % — ABNORMAL HIGH (ref 0.0–2.0)
Hgb A: 0 % — ABNORMAL LOW (ref 96.4–98.8)

## 2016-10-11 DIAGNOSIS — D571 Sickle-cell disease without crisis: Secondary | ICD-10-CM | POA: Diagnosis not present

## 2016-10-11 DIAGNOSIS — H25093 Other age-related incipient cataract, bilateral: Secondary | ICD-10-CM | POA: Diagnosis not present

## 2016-10-11 DIAGNOSIS — H3589 Other specified retinal disorders: Secondary | ICD-10-CM | POA: Diagnosis not present

## 2016-10-11 DIAGNOSIS — H5213 Myopia, bilateral: Secondary | ICD-10-CM | POA: Diagnosis not present

## 2016-10-11 DIAGNOSIS — H52223 Regular astigmatism, bilateral: Secondary | ICD-10-CM | POA: Diagnosis not present

## 2016-10-25 ENCOUNTER — Other Ambulatory Visit: Payer: Self-pay | Admitting: *Deleted

## 2016-10-25 DIAGNOSIS — F064 Anxiety disorder due to known physiological condition: Secondary | ICD-10-CM

## 2016-10-25 DIAGNOSIS — Z0000 Encounter for general adult medical examination without abnormal findings: Secondary | ICD-10-CM

## 2016-10-25 DIAGNOSIS — D5700 Hb-SS disease with crisis, unspecified: Secondary | ICD-10-CM

## 2016-10-25 DIAGNOSIS — G4701 Insomnia due to medical condition: Secondary | ICD-10-CM

## 2016-10-25 DIAGNOSIS — Z23 Encounter for immunization: Secondary | ICD-10-CM

## 2016-10-25 MED ORDER — ALPRAZOLAM 1 MG PO TABS: 1.0000 mg | ORAL_TABLET | Freq: Three times a day (TID) | ORAL | 0 refills | Status: DC | PRN

## 2016-10-25 MED ORDER — OXYCODONE HCL ER 80 MG PO T12A: 80.0000 mg | EXTENDED_RELEASE_TABLET | Freq: Two times a day (BID) | ORAL | 0 refills | Status: DC

## 2016-10-25 MED ORDER — OXYCODONE HCL 30 MG PO TABS: 30.0000 mg | ORAL_TABLET | Freq: Four times a day (QID) | ORAL | 0 refills | Status: DC | PRN

## 2016-11-22 ENCOUNTER — Other Ambulatory Visit: Payer: Self-pay | Admitting: *Deleted

## 2016-11-22 DIAGNOSIS — Z0000 Encounter for general adult medical examination without abnormal findings: Secondary | ICD-10-CM

## 2016-11-22 DIAGNOSIS — F064 Anxiety disorder due to known physiological condition: Secondary | ICD-10-CM

## 2016-11-22 DIAGNOSIS — D5700 Hb-SS disease with crisis, unspecified: Secondary | ICD-10-CM

## 2016-11-22 DIAGNOSIS — G4701 Insomnia due to medical condition: Secondary | ICD-10-CM

## 2016-11-22 DIAGNOSIS — Z23 Encounter for immunization: Secondary | ICD-10-CM

## 2016-11-22 MED ORDER — ALPRAZOLAM 1 MG PO TABS: 1.0000 mg | ORAL_TABLET | Freq: Three times a day (TID) | ORAL | 0 refills | Status: DC | PRN

## 2016-11-22 MED ORDER — OXYCODONE HCL ER 80 MG PO T12A: 80.0000 mg | EXTENDED_RELEASE_TABLET | Freq: Two times a day (BID) | ORAL | 0 refills | Status: DC

## 2016-11-22 MED ORDER — OXYCODONE HCL 30 MG PO TABS: 30.0000 mg | ORAL_TABLET | Freq: Four times a day (QID) | ORAL | 0 refills | Status: DC | PRN

## 2016-11-26 MED FILL — OxyCONTIN 80 MG T12A: 80 | 30 days supply | Qty: 60 | Fill #0

## 2016-11-26 MED FILL — ALPRAZolam 1 MG TABS: 1 | 30 days supply | Qty: 90 | Fill #0

## 2016-11-26 MED FILL — oxyCODONE HCL 30 MG TABS: 30 | 30 days supply | Qty: 120 | Fill #0

## 2016-12-26 ENCOUNTER — Other Ambulatory Visit: Payer: Self-pay | Admitting: *Deleted

## 2016-12-26 DIAGNOSIS — Z23 Encounter for immunization: Secondary | ICD-10-CM

## 2016-12-26 DIAGNOSIS — D5700 Hb-SS disease with crisis, unspecified: Secondary | ICD-10-CM

## 2016-12-26 DIAGNOSIS — G4701 Insomnia due to medical condition: Secondary | ICD-10-CM

## 2016-12-26 DIAGNOSIS — Z0000 Encounter for general adult medical examination without abnormal findings: Secondary | ICD-10-CM

## 2016-12-26 DIAGNOSIS — F064 Anxiety disorder due to known physiological condition: Secondary | ICD-10-CM

## 2016-12-26 MED ORDER — OXYCODONE HCL 30 MG PO TABS: 30.0000 mg | ORAL_TABLET | Freq: Four times a day (QID) | ORAL | 0 refills | Status: DC | PRN

## 2016-12-26 MED ORDER — OXYCODONE HCL ER 80 MG PO T12A: 80.0000 mg | EXTENDED_RELEASE_TABLET | Freq: Two times a day (BID) | ORAL | 0 refills | Status: DC

## 2016-12-26 MED ORDER — ALPRAZOLAM 1 MG PO TABS: 1.0000 mg | ORAL_TABLET | Freq: Three times a day (TID) | ORAL | 0 refills | Status: DC | PRN

## 2016-12-26 MED FILL — OxyCONTIN 80 MG T12A: 80 | 30 days supply | Qty: 60 | Fill #0

## 2016-12-26 MED FILL — ALPRAZolam 1 MG TABS: 1 | 30 days supply | Qty: 90 | Fill #0

## 2016-12-26 MED FILL — oxyCODONE HCL 30 MG TABS: 30 | 30 days supply | Qty: 120 | Fill #0

## 2017-01-23 ENCOUNTER — Other Ambulatory Visit: Payer: Self-pay | Admitting: *Deleted

## 2017-01-23 DIAGNOSIS — D5700 Hb-SS disease with crisis, unspecified: Secondary | ICD-10-CM

## 2017-01-23 DIAGNOSIS — G4701 Insomnia due to medical condition: Secondary | ICD-10-CM

## 2017-01-23 DIAGNOSIS — F064 Anxiety disorder due to known physiological condition: Secondary | ICD-10-CM

## 2017-01-23 DIAGNOSIS — Z23 Encounter for immunization: Secondary | ICD-10-CM

## 2017-01-23 DIAGNOSIS — Z0000 Encounter for general adult medical examination without abnormal findings: Secondary | ICD-10-CM

## 2017-01-23 MED ORDER — ALPRAZOLAM 1 MG PO TABS: 1.0000 mg | ORAL_TABLET | Freq: Three times a day (TID) | ORAL | 0 refills | Status: DC | PRN

## 2017-01-23 MED ORDER — OXYCODONE HCL ER 80 MG PO T12A: 80.0000 mg | EXTENDED_RELEASE_TABLET | Freq: Two times a day (BID) | ORAL | 0 refills | Status: DC

## 2017-01-23 MED ORDER — PROMETHAZINE HCL 25 MG PO TABS: 25.0000 mg | ORAL_TABLET | Freq: Four times a day (QID) | ORAL | 4 refills | Status: DC | PRN

## 2017-01-23 MED ORDER — OXYCODONE HCL 30 MG PO TABS: 30.0000 mg | ORAL_TABLET | Freq: Four times a day (QID) | ORAL | 0 refills | Status: DC | PRN

## 2017-02-27 ENCOUNTER — Other Ambulatory Visit: Payer: Self-pay | Admitting: *Deleted

## 2017-02-27 ENCOUNTER — Other Ambulatory Visit (HOSPITAL_BASED_OUTPATIENT_CLINIC_OR_DEPARTMENT_OTHER): Payer: Medicare HMO

## 2017-02-27 ENCOUNTER — Other Ambulatory Visit: Payer: Self-pay

## 2017-02-27 ENCOUNTER — Ambulatory Visit (HOSPITAL_BASED_OUTPATIENT_CLINIC_OR_DEPARTMENT_OTHER): Payer: Medicare HMO

## 2017-02-27 ENCOUNTER — Ambulatory Visit (HOSPITAL_BASED_OUTPATIENT_CLINIC_OR_DEPARTMENT_OTHER): Payer: Medicare HMO | Admitting: Family

## 2017-02-27 VITALS — BP 134/78 | HR 79 | Temp 98.6°F | Resp 20

## 2017-02-27 DIAGNOSIS — G4701 Insomnia due to medical condition: Secondary | ICD-10-CM | POA: Diagnosis not present

## 2017-02-27 DIAGNOSIS — Z23 Encounter for immunization: Secondary | ICD-10-CM

## 2017-02-27 DIAGNOSIS — I2699 Other pulmonary embolism without acute cor pulmonale: Secondary | ICD-10-CM | POA: Diagnosis not present

## 2017-02-27 DIAGNOSIS — Z0000 Encounter for general adult medical examination without abnormal findings: Secondary | ICD-10-CM

## 2017-02-27 DIAGNOSIS — D5700 Hb-SS disease with crisis, unspecified: Secondary | ICD-10-CM

## 2017-02-27 DIAGNOSIS — D571 Sickle-cell disease without crisis: Secondary | ICD-10-CM | POA: Diagnosis not present

## 2017-02-27 DIAGNOSIS — Z7901 Long term (current) use of anticoagulants: Secondary | ICD-10-CM

## 2017-02-27 DIAGNOSIS — D500 Iron deficiency anemia secondary to blood loss (chronic): Secondary | ICD-10-CM

## 2017-02-27 DIAGNOSIS — R69 Illness, unspecified: Secondary | ICD-10-CM | POA: Diagnosis not present

## 2017-02-27 DIAGNOSIS — F064 Anxiety disorder due to known physiological condition: Secondary | ICD-10-CM

## 2017-02-27 LAB — TECHNOLOGIST REVIEW CHCC SATELLITE: Tech Review: 2

## 2017-02-27 LAB — CMP (CANCER CENTER ONLY)
ALBUMIN: 3.9 g/dL (ref 3.3–5.5)
ALT(SGPT): 20 U/L (ref 10–47)
AST: 32 U/L (ref 11–38)
Alkaline Phosphatase: 71 U/L (ref 26–84)
BILIRUBIN TOTAL: 2 mg/dL — AB (ref 0.20–1.60)
BUN, Bld: 12 mg/dL (ref 7–22)
CALCIUM: 9.5 mg/dL (ref 8.0–10.3)
CO2: 25 mEq/L (ref 18–33)
CREATININE: 1 mg/dL (ref 0.6–1.2)
Chloride: 109 mEq/L — ABNORMAL HIGH (ref 98–108)
Glucose, Bld: 96 mg/dL (ref 73–118)
Potassium: 3.9 mEq/L (ref 3.3–4.7)
SODIUM: 142 meq/L (ref 128–145)
TOTAL PROTEIN: 7.4 g/dL (ref 6.4–8.1)

## 2017-02-27 LAB — CBC WITH DIFFERENTIAL (CANCER CENTER ONLY)
BASO#: 0.1 10*3/uL (ref 0.0–0.2)
BASO%: 1.1 % (ref 0.0–2.0)
EOS ABS: 0.1 10*3/uL (ref 0.0–0.5)
EOS%: 1.5 % (ref 0.0–7.0)
HEMATOCRIT: 27 % — AB (ref 38.7–49.9)
HEMOGLOBIN: 9.4 g/dL — AB (ref 13.0–17.1)
LYMPH#: 1.6 10*3/uL (ref 0.9–3.3)
LYMPH%: 24.8 % (ref 14.0–48.0)
MCH: 26.7 pg — AB (ref 28.0–33.4)
MCHC: 34.8 g/dL (ref 32.0–35.9)
MCV: 77 fL — ABNORMAL LOW (ref 82–98)
MONO#: 0.8 10*3/uL (ref 0.1–0.9)
MONO%: 12.2 % (ref 0.0–13.0)
NEUT%: 60.4 % (ref 40.0–80.0)
NEUTROS ABS: 3.9 10*3/uL (ref 1.5–6.5)
Platelets: 348 10*3/uL (ref 145–400)
RBC: 3.52 10*6/uL — AB (ref 4.20–5.70)
RDW: 22.7 % — ABNORMAL HIGH (ref 11.1–15.7)
WBC: 6.5 10*3/uL (ref 4.0–10.0)

## 2017-02-27 LAB — IRON AND TIBC
%SAT: 13 % — ABNORMAL LOW (ref 20–55)
IRON: 46 ug/dL (ref 42–163)
TIBC: 368 ug/dL (ref 202–409)
UIBC: 321 ug/dL (ref 117–376)

## 2017-02-27 LAB — FERRITIN: FERRITIN: 28 ng/mL (ref 22–316)

## 2017-02-27 MED ORDER — OXYCODONE HCL 30 MG PO TABS: 30.0000 mg | ORAL_TABLET | Freq: Four times a day (QID) | ORAL | 0 refills | Status: DC | PRN

## 2017-02-27 MED ORDER — LIDOCAINE-PRILOCAINE 2.5-2.5 % EX CREA: 1.0000 "application " | TOPICAL_CREAM | CUTANEOUS | 6 refills | Status: AC | PRN

## 2017-02-27 MED ORDER — OXYCODONE HCL ER 80 MG PO T12A: 80.0000 mg | EXTENDED_RELEASE_TABLET | Freq: Two times a day (BID) | ORAL | 0 refills | Status: DC

## 2017-02-27 MED ORDER — ALPRAZOLAM 1 MG PO TABS: 1.0000 mg | ORAL_TABLET | Freq: Three times a day (TID) | ORAL | 0 refills | Status: DC | PRN

## 2017-02-27 MED ORDER — SODIUM CHLORIDE 0.9% FLUSH
10.0000 mL | INTRAVENOUS | Status: DC | PRN
  Administered 2017-02-27: 10 mL via INTRAVENOUS
  Filled 2017-02-27: qty 10

## 2017-02-27 MED ORDER — INFLUENZA VAC SPLIT QUAD 0.5 ML IM SUSY
PREFILLED_SYRINGE | INTRAMUSCULAR | Status: AC
  Filled 2017-02-27: qty 0.5

## 2017-02-27 MED ORDER — HEPARIN SOD (PORK) LOCK FLUSH 100 UNIT/ML IV SOLN
500.0000 [IU] | Freq: Once | INTRAVENOUS | Status: AC
  Administered 2017-02-27: 500 [IU] via INTRAVENOUS
  Filled 2017-02-27: qty 5

## 2017-02-27 MED ORDER — INFLUENZA VAC SPLIT QUAD 0.5 ML IM SUSY
0.5000 mL | PREFILLED_SYRINGE | Freq: Once | INTRAMUSCULAR | Status: AC
  Administered 2017-02-27: 0.5 mL via INTRAMUSCULAR

## 2017-02-27 MED FILL — oxyCODONE HCL 30 MG TABS: 30 | 30 days supply | Qty: 120 | Fill #0

## 2017-02-27 MED FILL — ALPRAZolam 1 MG TABS: 1 | 30 days supply | Qty: 90 | Fill #0

## 2017-02-27 MED FILL — OxyCONTIN 80 MG T12A: 80 | 30 days supply | Qty: 60 | Fill #0

## 2017-02-27 NOTE — Patient Instructions (Signed)
Implanted Port Home Guide An implanted port is a type of central line that is placed under the skin. Central lines are used to provide IV access when treatment or nutrition needs to be given through a person's veins. Implanted ports are used for long-term IV access. An implanted port may be placed because:  You need IV medicine that would be irritating to the small veins in your hands or arms.  You need long-term IV medicines, such as antibiotics.  You need IV nutrition for a long period.  You need frequent blood draws for lab tests.  You need dialysis.  Implanted ports are usually placed in the chest area, but they can also be placed in the upper arm, the abdomen, or the leg. An implanted port has two main parts:  Reservoir. The reservoir is round and will appear as a small, raised area under your skin. The reservoir is the part where a needle is inserted to give medicines or draw blood.  Catheter. The catheter is a thin, flexible tube that extends from the reservoir. The catheter is placed into a large vein. Medicine that is inserted into the reservoir goes into the catheter and then into the vein.  How will I care for my incision site? Do not get the incision site wet. Bathe or shower as directed by your health care provider. How is my port accessed? Special steps must be taken to access the port:  Before the port is accessed, a numbing cream can be placed on the skin. This helps numb the skin over the port site.  Your health care provider uses a sterile technique to access the port. ? Your health care provider must put on a mask and sterile gloves. ? The skin over your port is cleaned carefully with an antiseptic and allowed to dry. ? The port is gently pinched between sterile gloves, and a needle is inserted into the port.  Only "non-coring" port needles should be used to access the port. Once the port is accessed, a blood return should be checked. This helps ensure that the port  is in the vein and is not clogged.  If your port needs to remain accessed for a constant infusion, a clear (transparent) bandage will be placed over the needle site. The bandage and needle will need to be changed every week, or as directed by your health care provider.  Keep the bandage covering the needle clean and dry. Do not get it wet. Follow your health care provider's instructions on how to take a shower or bath while the port is accessed.  If your port does not need to stay accessed, no bandage is needed over the port.  What is flushing? Flushing helps keep the port from getting clogged. Follow your health care provider's instructions on how and when to flush the port. Ports are usually flushed with saline solution or a medicine called heparin. The need for flushing will depend on how the port is used.  If the port is used for intermittent medicines or blood draws, the port will need to be flushed: ? After medicines have been given. ? After blood has been drawn. ? As part of routine maintenance.  If a constant infusion is running, the port may not need to be flushed.  How long will my port stay implanted? The port can stay in for as long as your health care provider thinks it is needed. When it is time for the port to come out, surgery will be   done to remove it. The procedure is similar to the one performed when the port was put in. When should I seek immediate medical care? When you have an implanted port, you should seek immediate medical care if:  You notice a bad smell coming from the incision site.  You have swelling, redness, or drainage at the incision site.  You have more swelling or pain at the port site or the surrounding area.  You have a fever that is not controlled with medicine.  This information is not intended to replace advice given to you by your health care provider. Make sure you discuss any questions you have with your health care provider. Document  Released: 03/26/2005 Document Revised: 09/01/2015 Document Reviewed: 12/01/2012 Elsevier Interactive Patient Education  2017 Elsevier Inc.  

## 2017-02-27 NOTE — Progress Notes (Signed)
Hematology and Oncology Follow Up Visit  [name] [id number] 1981/03/20 36 y.o. 02/27/2017   Principle Diagnosis:  Hemoglobin SS disease Pulmonary embolism - right side - diagnosed on 03/18/2016  Current Therapy:   Folic acid 1 mg by mouth daily Hydrea 500 mg by mouth twice a day Phlebotomy to maintain his hemoglobin below 11  Xarelto 20 mg by mouth daily-one year to be completed in 03/2017            Interim History:  [name] is here today for follow-up. He is doing well and has no complaints at this time.  He has pain and spasms in his back off and on but states that his current medication regimen is effective. He has not been hospitalized for a pain crisis in almost a year.  He is doing well on Xarelto. No episodes of bleeding, bruising or petechiae.  Her is taking his Hydrea and folic acid daily as prescribed.  He has had no issue with infections. No fever, chills, cough, rash, dizziness, SOB, chest pain, palpitations, abdominal pain or changes in bowel or bladder habits.  He as occasional nausea, no vomiting. He takes his phenergan as needed.  No swelling, tenderness, numbness or tingling in his extremities.  He has maintained a good appetite and is staying well hydrated. His weight is stable.   ECOG Performance Status: 0 - Asymptomatic  Medications:  Allergies as of 02/27/2017      Reactions   Morphine And Related Rash   Promethazine Hcl    Has hallucination when this drug mixed with other drugs   Morphine Hives, Itching   Promethazine Hcl Other (See Comments)   Has hallucination when this drug mixed with other drugs      Medication List        Accurate as of 02/27/17 10:45 AM. Always use your most recent med list.          ALPRAZolam 1 MG tablet Commonly known as:  XANAX Take 1 tablet (1 mg total) by mouth 3 (three) times daily as needed for anxiety.   folic acid 1 MG tablet Commonly known as:  FOLVITE Take by mouth.   gabapentin 400 MG  capsule Commonly known as:  NEURONTIN Take 1 capsule (400 mg total) by mouth 3 (three) times daily.   hydroxyurea 500 MG capsule Commonly known as:  HYDREA Take 2 capsules (1,000 mg total) by mouth daily. May take with food to minimize GI side effects.   Lactulose Soln Take 5 mLs by mouth as needed. 1 tsp. As needed   multivitamin with minerals Tabs tablet Take 1 tablet by mouth daily.   oxyCODONE 80 mg 12 hr tablet Commonly known as:  OXYCONTIN Take 1 tablet (80 mg total) by mouth every 12 (twelve) hours.   oxycodone 30 MG immediate release tablet Commonly known as:  ROXICODONE Take 1 tablet (30 mg total) by mouth every 6 (six) hours as needed for pain.   prazosin 2 MG capsule Commonly known as:  MINIPRESS Take by mouth.   promethazine 25 MG tablet Commonly known as:  PHENERGAN Take 1 tablet (25 mg total) by mouth every 6 (six) hours as needed for nausea or vomiting.   rivaroxaban 20 MG Tabs tablet Commonly known as:  XARELTO Take 1 tablet (20 mg total) by mouth daily with supper.   XARELTO 20 MG Tabs tablet Generic drug:  rivaroxaban TAKE 1 TABLET BY MOUTH EVERY DAY WITH DINNER   traZODone 100 MG tablet Commonly  known as:  DESYREL Take 1 tablet (100 mg total) by mouth at bedtime as needed for sleep.       Allergies:  Allergies  Allergen Reactions  . Morphine And Related Rash  . Promethazine Hcl     Has hallucination when this drug mixed with other drugs  . Morphine Hives and Itching  . Promethazine Hcl Other (See Comments)    Has hallucination when this drug mixed with other drugs    Past Medical History, Surgical history, Social history, and Family History were reviewed and updated.  Review of Systems: All other 10 point review of systems is negative.   Physical Exam:  oral temperature is 98.6 F (37 C). His blood pressure is 134/78 and his pulse is 79. His respiration is 20 and oxygen saturation is 100%.   Wt Readings from Last 3 Encounters:    09/28/16 168 lb (76.2 kg)  06/28/16 171 lb 1.3 oz (77.6 kg)  03/26/16 171 lb 12.8 oz (77.9 kg)    Ocular: Sclerae unicteric, pupils equal, round and reactive to light Ear-nose-throat: Oropharynx clear, dentition fair Lymphatic: No cervical, supraclavicular or axillary adenopathy Lungs no rales or rhonchi, good excursion bilaterally Heart regular rate and rhythm, no murmur appreciated Abd soft, nontender, positive bowel sounds, no liver or spleen tip palpated on exam, no fluid wave  MSK no focal spinal tenderness, no joint edema Neuro: non-focal, well-oriented, appropriate affect Breasts: Deferred   Lab Results  Component Value Date   WBC 6.4 09/28/2016   HGB 10.2 (L) 09/28/2016   HCT 30.0 (L) 09/28/2016   MCV 73 (L) 09/28/2016   PLT 405 Large & giant platelets (H) 09/28/2016   Lab Results  Component Value Date   FERRITIN 30 09/28/2016   IRON 37 (L) 09/28/2016   TIBC 398 09/28/2016   UIBC 361 09/28/2016   IRONPCTSAT 9 (L) 09/28/2016   Lab Results  Component Value Date   RETICCTPCT 10.9 (H) 02/17/2015   RBC 4.09 (L) 09/28/2016   RETICCTABS 420.7 (H) 02/17/2015   No results found for: KPAFRELGTCHN, LAMBDASER, KAPLAMBRATIO No results found for: Loel LoftyGGSERUM, IGA, IGMSERUM Lab Results  Component Value Date   TOTALPROTELP 6.4 03/01/2010   ALBUMINELP 53.7 (L) 03/01/2010   A1GS 5.3 (H) 03/01/2010   A2GS 6.8 (L) 03/01/2010   BETS 5.9 03/01/2010   BETA2SER 7.3 (H) 03/01/2010   GAMS 21.0 (H) 03/01/2010   MSPIKE NOT DETECTED 03/01/2010   SPEI  03/01/2010    (NOTE) Nonspecific diffuse polyclonal type increase in gamma globulins. Reviewed by Dallas BreedingJanice J. Hessling, MD, PhD, FCAP (Electronic Signature on File)     Chemistry      Component Value Date/Time   NA 139 09/28/2016 1111   NA 142 04/21/2015 1307   K 3.8 09/28/2016 1111   K 3.8 04/21/2015 1307   CL 108 09/28/2016 1111   CO2 24 09/28/2016 1111   CO2 24 04/21/2015 1307   BUN 8 09/28/2016 1111   BUN 10.1 04/21/2015 1307    CREATININE 1.0 09/28/2016 1111   CREATININE 0.9 04/21/2015 1307      Component Value Date/Time   CALCIUM 9.5 09/28/2016 1111   CALCIUM 9.5 04/21/2015 1307   ALKPHOS 81 09/28/2016 1111   ALKPHOS 114 04/21/2015 1307   AST 46 (H) 09/28/2016 1111   AST 39 (H) 04/21/2015 1307   ALT 48 (H) 09/28/2016 1111   ALT 22 04/21/2015 1307   BILITOT 2.00 (H) 09/28/2016 1111   BILITOT 1.79 (H) 04/21/2015 1307  Impression and Plan: Ms. Romeo AppleHarrison is a very pleasant 36 yo African American gentleman  with hemoglobin SS disease and right lower lobe PE. He continues to do well and has no complaints at this time.  Hgb is 9.4 so no phlebotomy needed this visit. He will continue on his same regimen with folic acid and Hydrea.  He will complete one year of full dose anticoagulation with Xarelto in December. At that time, he will decrease his dose to 10 mg PO daily for one full year to complete in December 2019.  He received his flu shot today.  Pain meds and Xanax were refilled today.  He will have his port flushed every 6 weeks and we will plan to see him back in another 4 months for follow-up.  He will contact our office with any questions or concerns. We can certainly see him sooner if need be.   Verdie MosherINCINNATI,SARAH M, NP 11/21/201810:45 AM

## 2017-02-28 LAB — RETICULOCYTES: Reticulocyte Count: 10.2 % — ABNORMAL HIGH (ref 0.6–2.6)

## 2017-03-04 LAB — HEMOCHROMATOSIS DNA-PCR(C282Y,H63D)

## 2017-03-04 MED FILL — LIDOCAINE-PRILOCAINE CREAM: 2.5-2.5 | 20 days supply | Qty: 30 | Fill #0

## 2017-03-05 LAB — HEMOGLOBINOPATHY EVALUATION
HEMOGLOBIN A2 QUANTITATION: 3.3 % — AB (ref 1.8–3.2)
HGB C: 0 %
HGB S: 89.3 % — AB
Hemoglobin F Quantitation: 7.4 % — ABNORMAL HIGH (ref 0.0–2.0)
Hgb A: 0 % — ABNORMAL LOW (ref 96.4–98.8)

## 2017-03-27 ENCOUNTER — Other Ambulatory Visit: Payer: Self-pay | Admitting: *Deleted

## 2017-03-27 DIAGNOSIS — Z0000 Encounter for general adult medical examination without abnormal findings: Secondary | ICD-10-CM

## 2017-03-27 DIAGNOSIS — G4701 Insomnia due to medical condition: Secondary | ICD-10-CM

## 2017-03-27 DIAGNOSIS — Z23 Encounter for immunization: Secondary | ICD-10-CM

## 2017-03-27 DIAGNOSIS — D5700 Hb-SS disease with crisis, unspecified: Secondary | ICD-10-CM

## 2017-03-27 DIAGNOSIS — F064 Anxiety disorder due to known physiological condition: Secondary | ICD-10-CM

## 2017-03-27 MED ORDER — OXYCODONE HCL 30 MG PO TABS: 30.0000 mg | ORAL_TABLET | Freq: Four times a day (QID) | ORAL | 0 refills | Status: DC | PRN

## 2017-03-27 MED ORDER — OXYCODONE HCL ER 80 MG PO T12A: 80.0000 mg | EXTENDED_RELEASE_TABLET | Freq: Two times a day (BID) | ORAL | 0 refills | Status: DC

## 2017-03-27 MED ORDER — ALPRAZOLAM 1 MG PO TABS: 1.0000 mg | ORAL_TABLET | Freq: Three times a day (TID) | ORAL | 0 refills | Status: DC | PRN

## 2017-04-17 ENCOUNTER — Inpatient Hospital Stay: Payer: Medicare HMO | Attending: Hematology & Oncology

## 2017-04-17 DIAGNOSIS — D571 Sickle-cell disease without crisis: Secondary | ICD-10-CM | POA: Insufficient documentation

## 2017-04-17 DIAGNOSIS — R5383 Other fatigue: Secondary | ICD-10-CM | POA: Insufficient documentation

## 2017-04-18 ENCOUNTER — Other Ambulatory Visit: Payer: Self-pay | Admitting: Hematology & Oncology

## 2017-04-18 DIAGNOSIS — D5700 Hb-SS disease with crisis, unspecified: Secondary | ICD-10-CM

## 2017-04-18 DIAGNOSIS — I2601 Septic pulmonary embolism with acute cor pulmonale: Secondary | ICD-10-CM

## 2017-04-25 ENCOUNTER — Other Ambulatory Visit: Payer: Self-pay | Admitting: *Deleted

## 2017-04-25 ENCOUNTER — Inpatient Hospital Stay: Payer: Medicare HMO

## 2017-04-25 VITALS — BP 110/66 | HR 68 | Temp 98.2°F | Resp 20

## 2017-04-25 DIAGNOSIS — R5383 Other fatigue: Secondary | ICD-10-CM | POA: Diagnosis present

## 2017-04-25 DIAGNOSIS — D571 Sickle-cell disease without crisis: Secondary | ICD-10-CM | POA: Diagnosis not present

## 2017-04-25 DIAGNOSIS — Z0000 Encounter for general adult medical examination without abnormal findings: Secondary | ICD-10-CM

## 2017-04-25 DIAGNOSIS — Z23 Encounter for immunization: Secondary | ICD-10-CM

## 2017-04-25 DIAGNOSIS — D5700 Hb-SS disease with crisis, unspecified: Secondary | ICD-10-CM

## 2017-04-25 DIAGNOSIS — F064 Anxiety disorder due to known physiological condition: Secondary | ICD-10-CM

## 2017-04-25 DIAGNOSIS — Z95828 Presence of other vascular implants and grafts: Secondary | ICD-10-CM

## 2017-04-25 DIAGNOSIS — G4701 Insomnia due to medical condition: Secondary | ICD-10-CM

## 2017-04-25 MED ORDER — SODIUM CHLORIDE 0.9 % IV SOLN
Freq: Once | INTRAVENOUS | Status: AC
  Administered 2017-04-25: 10:00:00 via INTRAVENOUS

## 2017-04-25 MED ORDER — LACTULOSE 10 GM/15ML PO SOLN: 10.0000 g | Freq: Two times a day (BID) | ORAL | 3 refills | Status: AC | PRN

## 2017-04-25 MED ORDER — ALPRAZOLAM 1 MG PO TABS: 1.0000 mg | ORAL_TABLET | Freq: Three times a day (TID) | ORAL | 0 refills | Status: DC | PRN

## 2017-04-25 MED ORDER — SODIUM CHLORIDE 0.9% FLUSH
10.0000 mL | INTRAVENOUS | Status: DC | PRN
  Administered 2017-04-25: 10 mL via INTRAVENOUS
  Filled 2017-04-25: qty 10

## 2017-04-25 MED ORDER — HEPARIN SOD (PORK) LOCK FLUSH 100 UNIT/ML IV SOLN
500.0000 [IU] | Freq: Once | INTRAVENOUS | Status: AC
  Administered 2017-04-25: 500 [IU] via INTRAVENOUS
  Filled 2017-04-25: qty 5

## 2017-04-25 MED ORDER — OXYCODONE HCL ER 80 MG PO T12A: 80.0000 mg | EXTENDED_RELEASE_TABLET | Freq: Two times a day (BID) | ORAL | 0 refills | Status: DC

## 2017-04-25 MED ORDER — OXYCODONE HCL 30 MG PO TABS: 30.0000 mg | ORAL_TABLET | Freq: Four times a day (QID) | ORAL | 0 refills | Status: DC | PRN

## 2017-04-25 NOTE — Progress Notes (Signed)
Patient requesting IV fluids d/t increased fatigue.  Dr. Myna HidalgoEnnever notified and order received to give pt one liter of NS over two hours.

## 2017-04-25 NOTE — Patient Instructions (Signed)
Dehydration, Adult Dehydration is a condition in which there is not enough fluid or water in the body. This happens when you lose more fluids than you take in. Important organs, such as the kidneys, brain, and heart, cannot function without a proper amount of fluids. Any loss of fluids from the body can lead to dehydration. Dehydration can range from mild to severe. This condition should be treated right away to prevent it from becoming severe. What are the causes? This condition may be caused by:  Vomiting.  Diarrhea.  Excessive sweating, such as from heat exposure or exercise.  Not drinking enough fluid, especially: ? When ill. ? While doing activity that requires a lot of energy.  Excessive urination.  Fever.  Infection.  Certain medicines, such as medicines that cause the body to lose excess fluid (diuretics).  Inability to access safe drinking water.  Reduced physical ability to get adequate water and food.  What increases the risk? This condition is more likely to develop in people:  Who have a poorly controlled long-term (chronic) illness, such as diabetes, heart disease, or kidney disease.  Who are age 65 or older.  Who are disabled.  Who live in a place with high altitude.  Who play endurance sports.  What are the signs or symptoms? Symptoms of mild dehydration may include:  Thirst.  Dry lips.  Slightly dry mouth.  Dry, warm skin.  Dizziness. Symptoms of moderate dehydration may include:  Very dry mouth.  Muscle cramps.  Dark urine. Urine may be the color of tea.  Decreased urine production.  Decreased tear production.  Heartbeat that is irregular or faster than normal (palpitations).  Headache.  Light-headedness, especially when you stand up from a sitting position.  Fainting (syncope). Symptoms of severe dehydration may include:  Changes in skin, such as: ? Cold and clammy skin. ? Blotchy (mottled) or pale skin. ? Skin that does  not quickly return to normal after being lightly pinched and released (poor skin turgor).  Changes in body fluids, such as: ? Extreme thirst. ? No tear production. ? Inability to sweat when body temperature is high, such as in hot weather. ? Very little urine production.  Changes in vital signs, such as: ? Weak pulse. ? Pulse that is more than 100 beats a minute when sitting still. ? Rapid breathing. ? Low blood pressure.  Other changes, such as: ? Sunken eyes. ? Cold hands and feet. ? Confusion. ? Lack of energy (lethargy). ? Difficulty waking up from sleep. ? Short-term weight loss. ? Unconsciousness. How is this diagnosed? This condition is diagnosed based on your symptoms and a physical exam. Blood and urine tests may be done to help confirm the diagnosis. How is this treated? Treatment for this condition depends on the severity. Mild or moderate dehydration can often be treated at home. Treatment should be started right away. Do not wait until dehydration becomes severe. Severe dehydration is an emergency and it needs to be treated in a hospital. Treatment for mild dehydration may include:  Drinking more fluids.  Replacing salts and minerals in your blood (electrolytes) that you may have lost. Treatment for moderate dehydration may include:  Drinking an oral rehydration solution (ORS). This is a drink that helps you replace fluids and electrolytes (rehydrate). It can be found at pharmacies and retail stores. Treatment for severe dehydration may include:  Receiving fluids through an IV tube.  Receiving an electrolyte solution through a feeding tube that is passed through your nose   and into your stomach (nasogastric tube, or NG tube).  Correcting any abnormalities in electrolytes.  Treating the underlying cause of dehydration. Follow these instructions at home:  If directed by your health care provider, drink an ORS: ? Make an ORS by following instructions on the  package. ? Start by drinking small amounts, about  cup (120 mL) every 5-10 minutes. ? Slowly increase how much you drink until you have taken the amount recommended by your health care provider.  Drink enough clear fluid to keep your urine clear or pale yellow. If you were told to drink an ORS, finish the ORS first, then start slowly drinking other clear fluids. Drink fluids such as: ? Water. Do not drink only water. Doing that can lead to having too little salt (sodium) in the body (hyponatremia). ? Ice chips. ? Fruit juice that you have added water to (diluted fruit juice). ? Low-calorie sports drinks.  Avoid: ? Alcohol. ? Drinks that contain a lot of sugar. These include high-calorie sports drinks, fruit juice that is not diluted, and soda. ? Caffeine. ? Foods that are greasy or contain a lot of fat or sugar.  Take over-the-counter and prescription medicines only as told by your health care provider.  Do not take sodium tablets. This can lead to having too much sodium in the body (hypernatremia).  Eat foods that contain a healthy balance of electrolytes, such as bananas, oranges, potatoes, tomatoes, and spinach.  Keep all follow-up visits as told by your health care provider. This is important. Contact a health care provider if:  You have abdominal pain that: ? Gets worse. ? Stays in one area (localizes).  You have a rash.  You have a stiff neck.  You are more irritable than usual.  You are sleepier or more difficult to wake up than usual.  You feel weak or dizzy.  You feel very thirsty.  You have urinated only a small amount of very dark urine over 6-8 hours. Get help right away if:  You have symptoms of severe dehydration.  You cannot drink fluids without vomiting.  Your symptoms get worse with treatment.  You have a fever.  You have a severe headache.  You have vomiting or diarrhea that: ? Gets worse. ? Does not go away.  You have blood or green matter  (bile) in your vomit.  You have blood in your stool. This may cause stool to look black and tarry.  You have not urinated in 6-8 hours.  You faint.  Your heart rate while sitting still is over 100 beats a minute.  You have trouble breathing. This information is not intended to replace advice given to you by your health care provider. Make sure you discuss any questions you have with your health care provider. Document Released: 03/26/2005 Document Revised: 10/21/2015 Document Reviewed: 05/20/2015 Elsevier Interactive Patient Education  2018 [employer]  

## 2017-05-08 DIAGNOSIS — I2782 Chronic pulmonary embolism: Secondary | ICD-10-CM | POA: Diagnosis not present

## 2017-05-08 DIAGNOSIS — Z79891 Long term (current) use of opiate analgesic: Secondary | ICD-10-CM | POA: Diagnosis not present

## 2017-05-08 DIAGNOSIS — R110 Nausea: Secondary | ICD-10-CM | POA: Diagnosis not present

## 2017-05-08 DIAGNOSIS — Z7901 Long term (current) use of anticoagulants: Secondary | ICD-10-CM | POA: Diagnosis not present

## 2017-05-08 DIAGNOSIS — G8929 Other chronic pain: Secondary | ICD-10-CM | POA: Diagnosis not present

## 2017-05-08 DIAGNOSIS — R69 Illness, unspecified: Secondary | ICD-10-CM | POA: Diagnosis not present

## 2017-05-08 DIAGNOSIS — Z885 Allergy status to narcotic agent status: Secondary | ICD-10-CM | POA: Diagnosis not present

## 2017-05-08 DIAGNOSIS — L299 Pruritus, unspecified: Secondary | ICD-10-CM | POA: Diagnosis not present

## 2017-05-08 DIAGNOSIS — D571 Sickle-cell disease without crisis: Secondary | ICD-10-CM | POA: Diagnosis not present

## 2017-05-28 ENCOUNTER — Other Ambulatory Visit: Payer: Self-pay | Admitting: *Deleted

## 2017-05-28 DIAGNOSIS — Z23 Encounter for immunization: Secondary | ICD-10-CM

## 2017-05-28 DIAGNOSIS — D5700 Hb-SS disease with crisis, unspecified: Secondary | ICD-10-CM

## 2017-05-28 DIAGNOSIS — G4701 Insomnia due to medical condition: Secondary | ICD-10-CM

## 2017-05-28 DIAGNOSIS — Z0000 Encounter for general adult medical examination without abnormal findings: Secondary | ICD-10-CM

## 2017-05-28 DIAGNOSIS — F064 Anxiety disorder due to known physiological condition: Secondary | ICD-10-CM

## 2017-05-28 MED ORDER — OXYCODONE HCL ER 80 MG PO T12A: 80.0000 mg | EXTENDED_RELEASE_TABLET | Freq: Two times a day (BID) | ORAL | 0 refills | Status: DC

## 2017-05-28 MED ORDER — OXYCODONE HCL 30 MG PO TABS: 30.0000 mg | ORAL_TABLET | Freq: Four times a day (QID) | ORAL | 0 refills | Status: DC | PRN

## 2017-05-28 MED ORDER — PROMETHAZINE HCL 25 MG PO TABS: 25.0000 mg | ORAL_TABLET | Freq: Four times a day (QID) | ORAL | 4 refills | Status: AC | PRN

## 2017-05-28 MED ORDER — ALPRAZOLAM 1 MG PO TABS: 1.0000 mg | ORAL_TABLET | Freq: Three times a day (TID) | ORAL | 0 refills | Status: DC | PRN

## 2017-05-29 ENCOUNTER — Other Ambulatory Visit: Payer: PRIVATE HEALTH INSURANCE

## 2017-06-24 ENCOUNTER — Other Ambulatory Visit: Payer: Self-pay | Admitting: *Deleted

## 2017-06-24 DIAGNOSIS — D5700 Hb-SS disease with crisis, unspecified: Secondary | ICD-10-CM

## 2017-06-24 DIAGNOSIS — Z23 Encounter for immunization: Secondary | ICD-10-CM

## 2017-06-24 DIAGNOSIS — G4701 Insomnia due to medical condition: Secondary | ICD-10-CM

## 2017-06-24 DIAGNOSIS — F064 Anxiety disorder due to known physiological condition: Secondary | ICD-10-CM

## 2017-06-24 DIAGNOSIS — Z0000 Encounter for general adult medical examination without abnormal findings: Secondary | ICD-10-CM

## 2017-06-25 MED ORDER — OXYCODONE HCL ER 80 MG PO T12A: 80.0000 mg | EXTENDED_RELEASE_TABLET | Freq: Two times a day (BID) | ORAL | 0 refills | Status: DC

## 2017-06-25 MED ORDER — OXYCODONE HCL 30 MG PO TABS: 30.0000 mg | ORAL_TABLET | Freq: Four times a day (QID) | ORAL | 0 refills | Status: DC | PRN

## 2017-06-25 MED ORDER — ALPRAZOLAM 1 MG PO TABS: 1.0000 mg | ORAL_TABLET | Freq: Three times a day (TID) | ORAL | 0 refills | Status: DC | PRN

## 2017-06-26 ENCOUNTER — Inpatient Hospital Stay: Payer: Medicare HMO

## 2017-06-26 ENCOUNTER — Inpatient Hospital Stay: Payer: Medicare HMO | Admitting: Family

## 2017-07-04 ENCOUNTER — Inpatient Hospital Stay (HOSPITAL_BASED_OUTPATIENT_CLINIC_OR_DEPARTMENT_OTHER): Payer: Medicare HMO | Admitting: Family

## 2017-07-04 ENCOUNTER — Inpatient Hospital Stay: Payer: Medicare HMO

## 2017-07-04 ENCOUNTER — Encounter: Payer: Self-pay | Admitting: Family

## 2017-07-04 ENCOUNTER — Inpatient Hospital Stay: Payer: Medicare HMO | Attending: Hematology & Oncology

## 2017-07-04 ENCOUNTER — Other Ambulatory Visit: Payer: Self-pay

## 2017-07-04 VITALS — BP 123/71 | HR 75 | Temp 98.3°F | Resp 18 | Wt 149.0 lb

## 2017-07-04 DIAGNOSIS — M542 Cervicalgia: Secondary | ICD-10-CM | POA: Diagnosis not present

## 2017-07-04 DIAGNOSIS — F064 Anxiety disorder due to known physiological condition: Secondary | ICD-10-CM

## 2017-07-04 DIAGNOSIS — Z23 Encounter for immunization: Secondary | ICD-10-CM

## 2017-07-04 DIAGNOSIS — M25512 Pain in left shoulder: Secondary | ICD-10-CM | POA: Insufficient documentation

## 2017-07-04 DIAGNOSIS — R209 Unspecified disturbances of skin sensation: Secondary | ICD-10-CM | POA: Insufficient documentation

## 2017-07-04 DIAGNOSIS — D571 Sickle-cell disease without crisis: Secondary | ICD-10-CM | POA: Insufficient documentation

## 2017-07-04 DIAGNOSIS — D5700 Hb-SS disease with crisis, unspecified: Secondary | ICD-10-CM

## 2017-07-04 DIAGNOSIS — G4701 Insomnia due to medical condition: Secondary | ICD-10-CM

## 2017-07-04 DIAGNOSIS — I2699 Other pulmonary embolism without acute cor pulmonale: Secondary | ICD-10-CM | POA: Insufficient documentation

## 2017-07-04 DIAGNOSIS — D500 Iron deficiency anemia secondary to blood loss (chronic): Secondary | ICD-10-CM

## 2017-07-04 DIAGNOSIS — Z0000 Encounter for general adult medical examination without abnormal findings: Secondary | ICD-10-CM

## 2017-07-04 LAB — CBC WITH DIFFERENTIAL (CANCER CENTER ONLY)
BASOS ABS: 0.1 10*3/uL (ref 0.0–0.1)
BASOS PCT: 1 %
Eosinophils Absolute: 0.1 10*3/uL (ref 0.0–0.5)
Eosinophils Relative: 1 %
HEMATOCRIT: 25.1 % — AB (ref 38.7–49.9)
HEMOGLOBIN: 8.7 g/dL — AB (ref 13.0–17.1)
LYMPHS PCT: 30 %
Lymphs Abs: 2.3 10*3/uL (ref 0.9–3.3)
MCH: 26.4 pg — ABNORMAL LOW (ref 28.0–33.4)
MCHC: 34.7 g/dL (ref 32.0–35.9)
MCV: 76.3 fL — AB (ref 82.0–98.0)
Monocytes Absolute: 0.9 10*3/uL (ref 0.1–0.9)
Monocytes Relative: 12 %
NEUTROS ABS: 4.2 10*3/uL (ref 1.5–6.5)
Neutrophils Relative %: 56 %
Platelet Count: 326 10*3/uL (ref 145–400)
RBC: 3.29 MIL/uL — AB (ref 4.20–5.70)
RDW: 23.3 % — AB (ref 11.1–15.7)
WBC: 7.6 10*3/uL (ref 4.0–10.0)

## 2017-07-04 LAB — CMP (CANCER CENTER ONLY)
ALBUMIN: 4.3 g/dL (ref 3.5–5.0)
ALK PHOS: 68 U/L (ref 26–84)
ALT: 21 U/L (ref 10–47)
ANION GAP: 5 (ref 5–15)
AST: 34 U/L (ref 11–38)
BILIRUBIN TOTAL: 2.1 mg/dL — AB (ref 0.2–1.6)
BUN: 9 mg/dL (ref 7–22)
CO2: 24 mmol/L (ref 18–33)
CREATININE: 0.9 mg/dL (ref 0.60–1.20)
Calcium: 9.1 mg/dL (ref 8.0–10.3)
Chloride: 109 mmol/L — ABNORMAL HIGH (ref 98–108)
Glucose, Bld: 88 mg/dL (ref 73–118)
Potassium: 3.6 mmol/L (ref 3.3–4.7)
Sodium: 138 mmol/L (ref 128–145)
TOTAL PROTEIN: 7.7 g/dL (ref 6.4–8.1)

## 2017-07-04 LAB — RETICULOCYTES
RBC.: 3.39 MIL/uL — AB (ref 4.22–5.81)
Retic Count, Absolute: 342.4 10*3/uL — ABNORMAL HIGH (ref 19.0–186.0)
Retic Ct Pct: 10.1 % — ABNORMAL HIGH (ref 0.4–3.1)

## 2017-07-04 MED ORDER — GABAPENTIN 300 MG PO CAPS: 600.0000 mg | ORAL_CAPSULE | Freq: Three times a day (TID) | ORAL | 3 refills | Status: DC
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
## 2017-07-05 LAB — FERRITIN: Ferritin: 30 ng/mL (ref 22–316)

## 2017-07-05 LAB — IRON AND TIBC
IRON: 38 ug/dL — AB (ref 42–163)
SATURATION RATIOS: 11 % — AB (ref 42–163)
TIBC: 343 ug/dL (ref 202–409)
UIBC: 305 ug/dL

## 2017-07-09 LAB — HEMOGLOBINOPATHY EVALUATION
HGB A2 QUANT: 3.2 % (ref 1.8–3.2)
HGB A: 0 % — AB (ref 96.4–98.8)
HGB C: 0 %
HGB F QUANT: 6.8 % — AB (ref 0.0–2.0)
Hgb S Quant: 90 % — ABNORMAL HIGH
Hgb Variant: 0 %

## 2017-07-19 DIAGNOSIS — D571 Sickle-cell disease without crisis: Secondary | ICD-10-CM | POA: Diagnosis not present

## 2017-07-19 DIAGNOSIS — S4492XS Injury of unspecified nerve at shoulder and upper arm level, left arm, sequela: Secondary | ICD-10-CM | POA: Diagnosis not present

## 2017-07-19 DIAGNOSIS — I2699 Other pulmonary embolism without acute cor pulmonale: Secondary | ICD-10-CM | POA: Diagnosis not present

## 2017-07-19 DIAGNOSIS — M542 Cervicalgia: Secondary | ICD-10-CM | POA: Diagnosis not present

## 2017-07-25 ENCOUNTER — Other Ambulatory Visit: Payer: Self-pay | Admitting: *Deleted

## 2017-07-25 DIAGNOSIS — D5700 Hb-SS disease with crisis, unspecified: Secondary | ICD-10-CM

## 2017-07-25 DIAGNOSIS — Z0000 Encounter for general adult medical examination without abnormal findings: Secondary | ICD-10-CM

## 2017-07-25 DIAGNOSIS — Z23 Encounter for immunization: Secondary | ICD-10-CM

## 2017-07-25 DIAGNOSIS — G4701 Insomnia due to medical condition: Secondary | ICD-10-CM

## 2017-07-25 DIAGNOSIS — F064 Anxiety disorder due to known physiological condition: Secondary | ICD-10-CM

## 2017-07-25 MED ORDER — ALPRAZOLAM 1 MG PO TABS: 1.0000 mg | ORAL_TABLET | Freq: Three times a day (TID) | ORAL | 0 refills | Status: DC | PRN

## 2017-07-25 MED ORDER — OXYCODONE HCL 30 MG PO TABS: 30.0000 mg | ORAL_TABLET | Freq: Four times a day (QID) | ORAL | 0 refills | Status: DC | PRN

## 2017-07-25 MED ORDER — OXYCODONE HCL ER 80 MG PO T12A: 80.0000 mg | EXTENDED_RELEASE_TABLET | Freq: Two times a day (BID) | ORAL | 0 refills | Status: DC

## 2017-08-23 ENCOUNTER — Other Ambulatory Visit: Payer: Self-pay | Admitting: *Deleted

## 2017-08-23 DIAGNOSIS — G4701 Insomnia due to medical condition: Secondary | ICD-10-CM

## 2017-08-23 DIAGNOSIS — Z23 Encounter for immunization: Secondary | ICD-10-CM

## 2017-08-23 DIAGNOSIS — D5700 Hb-SS disease with crisis, unspecified: Secondary | ICD-10-CM

## 2017-08-23 DIAGNOSIS — F064 Anxiety disorder due to known physiological condition: Secondary | ICD-10-CM

## 2017-08-23 DIAGNOSIS — Z0000 Encounter for general adult medical examination without abnormal findings: Secondary | ICD-10-CM

## 2017-08-23 MED ORDER — OXYCODONE HCL ER 80 MG PO T12A: 80.0000 mg | EXTENDED_RELEASE_TABLET | Freq: Two times a day (BID) | ORAL | 0 refills | Status: DC

## 2017-08-23 MED ORDER — ALPRAZOLAM 1 MG PO TABS: 1.0000 mg | ORAL_TABLET | Freq: Three times a day (TID) | ORAL | 0 refills | Status: DC | PRN

## 2017-08-23 MED ORDER — OXYCODONE HCL 30 MG PO TABS: 30.0000 mg | ORAL_TABLET | Freq: Four times a day (QID) | ORAL | 0 refills | Status: DC | PRN

## 2017-09-23 ENCOUNTER — Other Ambulatory Visit: Payer: Self-pay | Admitting: *Deleted

## 2017-09-23 DIAGNOSIS — G4701 Insomnia due to medical condition: Secondary | ICD-10-CM

## 2017-09-23 DIAGNOSIS — F064 Anxiety disorder due to known physiological condition: Secondary | ICD-10-CM

## 2017-09-23 DIAGNOSIS — Z23 Encounter for immunization: Secondary | ICD-10-CM

## 2017-09-23 DIAGNOSIS — D5700 Hb-SS disease with crisis, unspecified: Secondary | ICD-10-CM

## 2017-09-23 DIAGNOSIS — Z0000 Encounter for general adult medical examination without abnormal findings: Secondary | ICD-10-CM

## 2017-09-23 MED ORDER — ALPRAZOLAM 1 MG PO TABS: 1.0000 mg | ORAL_TABLET | Freq: Three times a day (TID) | ORAL | 0 refills | Status: DC | PRN

## 2017-09-23 MED ORDER — OXYCODONE HCL 30 MG PO TABS: 30.0000 mg | ORAL_TABLET | Freq: Four times a day (QID) | ORAL | 0 refills | Status: DC | PRN

## 2017-09-23 MED ORDER — OXYCODONE HCL ER 80 MG PO T12A: 80.0000 mg | EXTENDED_RELEASE_TABLET | Freq: Two times a day (BID) | ORAL | 0 refills | Status: DC

## 2017-09-26 ENCOUNTER — Inpatient Hospital Stay: Payer: Medicare HMO | Attending: Hematology & Oncology

## 2017-09-26 VITALS — BP 125/69 | HR 59 | Temp 98.9°F | Resp 17 | Wt 140.0 lb

## 2017-09-26 DIAGNOSIS — D571 Sickle-cell disease without crisis: Secondary | ICD-10-CM | POA: Insufficient documentation

## 2017-09-26 DIAGNOSIS — Z452 Encounter for adjustment and management of vascular access device: Secondary | ICD-10-CM | POA: Insufficient documentation

## 2017-09-26 DIAGNOSIS — Z95828 Presence of other vascular implants and grafts: Secondary | ICD-10-CM

## 2017-09-26 MED ORDER — HEPARIN SOD (PORK) LOCK FLUSH 100 UNIT/ML IV SOLN
500.0000 [IU] | Freq: Once | INTRAVENOUS | Status: AC
  Administered 2017-09-26: 500 [IU] via INTRAVENOUS
  Filled 2017-09-26: qty 5

## 2017-09-26 MED ORDER — SODIUM CHLORIDE 0.9% FLUSH
10.0000 mL | INTRAVENOUS | Status: DC | PRN
  Administered 2017-09-26: 10 mL via INTRAVENOUS
  Filled 2017-09-26: qty 10

## 2017-10-11 DIAGNOSIS — Z6823 Body mass index (BMI) 23.0-23.9, adult: Secondary | ICD-10-CM | POA: Diagnosis not present

## 2017-10-11 DIAGNOSIS — Z0000 Encounter for general adult medical examination without abnormal findings: Secondary | ICD-10-CM | POA: Diagnosis not present

## 2017-10-11 DIAGNOSIS — Z23 Encounter for immunization: Secondary | ICD-10-CM | POA: Diagnosis not present

## 2017-10-11 DIAGNOSIS — Z2089 Contact with and (suspected) exposure to other communicable diseases: Secondary | ICD-10-CM | POA: Diagnosis not present

## 2017-10-11 DIAGNOSIS — D571 Sickle-cell disease without crisis: Secondary | ICD-10-CM | POA: Diagnosis not present

## 2017-10-21 DIAGNOSIS — D5700 Hb-SS disease with crisis, unspecified: Secondary | ICD-10-CM | POA: Diagnosis not present

## 2017-10-23 ENCOUNTER — Other Ambulatory Visit: Payer: Self-pay | Admitting: *Deleted

## 2017-10-23 DIAGNOSIS — G4701 Insomnia due to medical condition: Secondary | ICD-10-CM

## 2017-10-23 DIAGNOSIS — Z23 Encounter for immunization: Secondary | ICD-10-CM

## 2017-10-23 DIAGNOSIS — D5700 Hb-SS disease with crisis, unspecified: Secondary | ICD-10-CM

## 2017-10-23 DIAGNOSIS — F064 Anxiety disorder due to known physiological condition: Secondary | ICD-10-CM

## 2017-10-23 DIAGNOSIS — Z0000 Encounter for general adult medical examination without abnormal findings: Secondary | ICD-10-CM

## 2017-10-23 MED ORDER — ALPRAZOLAM 1 MG PO TABS: 1.0000 mg | ORAL_TABLET | Freq: Three times a day (TID) | ORAL | 0 refills | Status: DC | PRN

## 2017-10-23 MED ORDER — OXYCODONE HCL ER 80 MG PO T12A: 80.0000 mg | EXTENDED_RELEASE_TABLET | Freq: Two times a day (BID) | ORAL | 0 refills | Status: DC

## 2017-10-23 MED ORDER — OXYCODONE HCL 30 MG PO TABS: 30.0000 mg | ORAL_TABLET | Freq: Four times a day (QID) | ORAL | 0 refills | Status: DC | PRN

## 2017-10-24 ENCOUNTER — Other Ambulatory Visit: Payer: PRIVATE HEALTH INSURANCE

## 2017-10-24 ENCOUNTER — Inpatient Hospital Stay: Payer: PRIVATE HEALTH INSURANCE

## 2017-10-24 ENCOUNTER — Ambulatory Visit: Payer: PRIVATE HEALTH INSURANCE | Admitting: Family

## 2017-11-14 ENCOUNTER — Inpatient Hospital Stay: Payer: Medicare HMO

## 2017-11-14 ENCOUNTER — Inpatient Hospital Stay: Payer: Medicare HMO | Attending: Hematology & Oncology

## 2017-11-14 ENCOUNTER — Other Ambulatory Visit: Payer: Self-pay

## 2017-11-14 ENCOUNTER — Inpatient Hospital Stay (HOSPITAL_BASED_OUTPATIENT_CLINIC_OR_DEPARTMENT_OTHER): Payer: Medicare HMO | Admitting: Family

## 2017-11-14 ENCOUNTER — Encounter: Payer: Self-pay | Admitting: Family

## 2017-11-14 DIAGNOSIS — G4701 Insomnia due to medical condition: Secondary | ICD-10-CM

## 2017-11-14 DIAGNOSIS — I2699 Other pulmonary embolism without acute cor pulmonale: Secondary | ICD-10-CM | POA: Diagnosis not present

## 2017-11-14 DIAGNOSIS — D571 Sickle-cell disease without crisis: Secondary | ICD-10-CM | POA: Diagnosis present

## 2017-11-14 DIAGNOSIS — Z95828 Presence of other vascular implants and grafts: Secondary | ICD-10-CM

## 2017-11-14 DIAGNOSIS — Z7901 Long term (current) use of anticoagulants: Secondary | ICD-10-CM | POA: Insufficient documentation

## 2017-11-14 DIAGNOSIS — D500 Iron deficiency anemia secondary to blood loss (chronic): Secondary | ICD-10-CM

## 2017-11-14 DIAGNOSIS — F064 Anxiety disorder due to known physiological condition: Secondary | ICD-10-CM

## 2017-11-14 DIAGNOSIS — Z0000 Encounter for general adult medical examination without abnormal findings: Secondary | ICD-10-CM

## 2017-11-14 DIAGNOSIS — Z23 Encounter for immunization: Secondary | ICD-10-CM

## 2017-11-14 DIAGNOSIS — D5700 Hb-SS disease with crisis, unspecified: Secondary | ICD-10-CM

## 2017-11-14 LAB — CMP (CANCER CENTER ONLY)
ALBUMIN: 3.8 g/dL (ref 3.5–5.0)
ALK PHOS: 75 U/L (ref 26–84)
ALT: 18 U/L (ref 10–47)
AST: 39 U/L — ABNORMAL HIGH (ref 11–38)
Anion gap: 1 — ABNORMAL LOW (ref 5–15)
BUN: 9 mg/dL (ref 7–22)
CALCIUM: 9.3 mg/dL (ref 8.0–10.3)
CHLORIDE: 113 mmol/L — AB (ref 98–108)
CO2: 26 mmol/L (ref 18–33)
CREATININE: 1 mg/dL (ref 0.60–1.20)
Glucose, Bld: 102 mg/dL (ref 73–118)
Potassium: 3.5 mmol/L (ref 3.3–4.7)
Sodium: 140 mmol/L (ref 128–145)
TOTAL PROTEIN: 7.4 g/dL (ref 6.4–8.1)
Total Bilirubin: 2.1 mg/dL — ABNORMAL HIGH (ref 0.2–1.6)

## 2017-11-14 LAB — CBC WITH DIFFERENTIAL (CANCER CENTER ONLY)
BASOS PCT: 1 %
Basophils Absolute: 0 10*3/uL (ref 0.0–0.1)
Eosinophils Absolute: 0.1 10*3/uL (ref 0.0–0.5)
Eosinophils Relative: 2 %
HEMATOCRIT: 25.7 % — AB (ref 38.7–49.9)
HEMOGLOBIN: 8.8 g/dL — AB (ref 13.0–17.1)
LYMPHS ABS: 1.8 10*3/uL (ref 0.9–3.3)
Lymphocytes Relative: 27 %
MCH: 26.6 pg — ABNORMAL LOW (ref 28.0–33.4)
MCHC: 34.2 g/dL (ref 32.0–35.9)
MCV: 77.6 fL — ABNORMAL LOW (ref 82.0–98.0)
MONOS PCT: 12 %
Monocytes Absolute: 0.8 10*3/uL (ref 0.1–0.9)
NEUTROS ABS: 4 10*3/uL (ref 1.5–6.5)
NEUTROS PCT: 58 %
Platelet Count: 320 10*3/uL (ref 145–400)
RBC: 3.31 MIL/uL — AB (ref 4.20–5.70)
RDW: 22.4 % — ABNORMAL HIGH (ref 11.1–15.7)
WBC Count: 6.8 10*3/uL (ref 4.0–10.0)

## 2017-11-14 MED ORDER — GABAPENTIN 300 MG PO CAPS: 600.0000 mg | ORAL_CAPSULE | Freq: Three times a day (TID) | ORAL | 3 refills | Status: AC

## 2017-11-14 MED ORDER — HEPARIN SOD (PORK) LOCK FLUSH 100 UNIT/ML IV SOLN
500.0000 [IU] | Freq: Once | INTRAVENOUS | Status: AC
  Administered 2017-11-14: 500 [IU] via INTRAVENOUS
  Filled 2017-11-14: qty 5

## 2017-11-14 NOTE — Progress Notes (Signed)
Hematology and Oncology Follow Up Visit  [name] [id number] [birth year] 37 y.o. 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 He is doing well and has had no recent sickle cell pain crisis. He is still having neck and shoulder pain. Unfortunately his Changed Neurontin prescription went to the wrong pharmacy and they did not call to let him know it was ready. He is still taking his old dose.  Hgb today is 8.8 so no phlebotomy needed.  He verbalized that he is taking his folic acid and Hydrea daily as prescribed.  He has had no issue with infections. No fever, chills, n/v, cough, rash, dizziness, SOB, chest pain, palpitations, abdominal pain or changes in bowel or bladder habits.  No swelling, numbness or tingling in his extremities.  No episodes of bleeding, no bruising or petechiae. No lymphadenopathy noted on exam.  He has a good appetite and is staying well hydrated. His weight is stable.   ECOG Performance Status: 1 - Symptomatic but completely ambulatory  Medications:  Allergies as of 11/14/2017      Reactions   Morphine And Related Rash   Promethazine Hcl    Has hallucination when this drug mixed with other drugs   Morphine Hives, Itching   Promethazine Hcl Other (See Comments)   Has hallucination when this drug mixed with other drugs Has hallucination when this drug mixed with other drugs      Medication List        Accurate as of 11/14/17  2:39 PM. Always use your most recent med list.          ALPRAZolam 1 MG tablet Commonly known as:  XANAX Take 1 tablet (1 mg total) by mouth 3 (three) times daily  as needed for anxiety.   folic acid 1 MG tablet Commonly known as:  FOLVITE Take by mouth.   gabapentin 300 MG capsule Commonly known as:  NEURONTIN Take 2 capsules (600 mg total) by mouth 3 (three) times daily.   hydroxyurea 500 MG capsule Commonly known as:  HYDREA Take 2 capsules (1,000 mg total) by mouth daily. May take with food to minimize GI side effects.   lactulose 10 GM/15ML solution Commonly known as:  CHRONULAC Take 15 mLs (10 g total) by mouth 2 (two) times daily as needed for mild constipation.   Lactulose Soln Take 5 mLs by mouth as needed. 1 tsp. As needed   lidocaine-prilocaine cream Commonly known as:  EMLA Apply 1 application topically as needed. Apply to port one hour before appointment   multivitamin with minerals Tabs tablet Take 1 tablet by mouth daily.   oxycodone 30 MG immediate release tablet Commonly known as:  ROXICODONE Take 1 tablet (30 mg total) by mouth every 6 (six) hours as needed for pain.   oxyCODONE 80 mg 12 hr tablet Commonly known as:  OXYCONTIN Take 1 tablet (80 mg total) by mouth every 12 (twelve) hours.   prazosin 2 MG capsule Commonly known as:  MINIPRESS Take by mouth.   promethazine 25 MG tablet Commonly known as:  PHENERGAN Take 1 tablet (25 mg total)  by mouth every 6 (six) hours as needed for nausea or vomiting.   traZODone 100 MG tablet Commonly known as:  DESYREL Take 1 tablet (100 mg total) by mouth at bedtime as needed for sleep.   XARELTO 20 MG Tabs tablet Generic drug:  rivaroxaban TAKE 1 TABLET BY MOUTH EVERY DAY WITH DINNER   XARELTO 20 MG Tabs tablet Generic drug:  rivaroxaban TAKE 1 TABLET BY MOUTH EVERY DAY WITH DINNER       Allergies:  Allergies  Allergen Reactions  . Morphine And Related Rash  . Promethazine Hcl     Has hallucination when this drug mixed with other drugs  . Morphine Hives and Itching  . Promethazine Hcl Other (See Comments)    Has hallucination when this drug mixed with  other drugs Has hallucination when this drug mixed with other drugs    Past Medical History, Surgical history, Social history, and Family History were reviewed and updated.  Review of Systems: All other 10 point review of systems is negative.   Physical Exam:  vitals were not taken for this visit.   Wt Readings from Last 3 Encounters:  09/26/17 140 lb (63.5 kg)  07/04/17 149 lb (67.6 kg)  09/28/16 168 lb (76.2 kg)    Ocular: Sclerae unicteric, pupils equal, round and reactive to light Ear-nose-throat: Oropharynx clear, dentition fair Lymphatic: No cervical, supraclavicular or axillary adenopathy Lungs no rales or rhonchi, good excursion bilaterally Heart regular rate and rhythm, no murmur appreciated Abd soft, nontender, positive bowel sounds, no liver or spleen tip palpated on exam, no fluid wave  MSK no focal spinal tenderness, no joint edema Neuro: non-focal, well-oriented, appropriate affect Breasts: Deferred   Lab Results  Component Value Date   WBC 7.6 07/04/2017   HGB 8.7 (L) 07/04/2017   HCT 25.1 (L) 07/04/2017   MCV 76.3 (L) 07/04/2017   PLT 326 07/04/2017   Lab Results  Component Value Date   FERRITIN 30 07/04/2017   IRON 38 (L) 07/04/2017   TIBC 343 07/04/2017   UIBC 305 07/04/2017   IRONPCTSAT 11 (L) 07/04/2017   Lab Results  Component Value Date   RETICCTPCT 10.1 (H) 07/04/2017   RBC 3.29 (L) 07/04/2017   RBC 3.39 (L) 07/04/2017   RETICCTABS 420.7 (H) 02/17/2015   No results found for: KPAFRELGTCHN, LAMBDASER, KAPLAMBRATIO No results found for: Loel LoftyGGSERUM, IGA, IGMSERUM Lab Results  Component Value Date   TOTALPROTELP 6.4 03/01/2010   ALBUMINELP 53.7 (L) 03/01/2010   A1GS 5.3 (H) 03/01/2010   A2GS 6.8 (L) 03/01/2010   BETS 5.9 03/01/2010   BETA2SER 7.3 (H) 03/01/2010   GAMS 21.0 (H) 03/01/2010   MSPIKE NOT DETECTED 03/01/2010   SPEI  03/01/2010    (NOTE) Nonspecific diffuse polyclonal type increase in gamma globulins. Reviewed by Dallas BreedingJanice J.  Hessling, MD, PhD, FCAP (Electronic Signature on File)     Chemistry      Component Value Date/Time   NA 138 07/04/2017 1406   NA 142 02/27/2017 1012   NA 142 04/21/2015 1307   K 3.6 07/04/2017 1406   K 3.9 02/27/2017 1012   K 3.8 04/21/2015 1307   CL 109 (H) 07/04/2017 1406   CL 109 (H) 02/27/2017 1012   CO2 24 07/04/2017 1406   CO2 25 02/27/2017 1012   CO2 24 04/21/2015 1307   BUN 9 07/04/2017 1406   BUN 12 02/27/2017 1012   BUN 10.1 04/21/2015 1307   CREATININE 0.90 07/04/2017 1406   CREATININE 1.0 02/27/2017  1012   CREATININE 0.9 04/21/2015 1307      Component Value Date/Time   CALCIUM 9.1 07/04/2017 1406   CALCIUM 9.5 02/27/2017 1012   CALCIUM 9.5 04/21/2015 1307   ALKPHOS 68 07/04/2017 1406   ALKPHOS 71 02/27/2017 1012   ALKPHOS 114 04/21/2015 1307   AST 34 07/04/2017 1406   AST 39 (H) 04/21/2015 1307   ALT 21 07/04/2017 1406   ALT 20 02/27/2017 1012   ALT 22 04/21/2015 1307   BILITOT 2.1 (H) 07/04/2017 1406   BILITOT 1.79 (H) 04/21/2015 1307      Impression and Plan: Mr. Orman is a very pleasant 37 yo African American gentleman with hemoglobin SS disease and right lower lobe PE. He continues to do well and has not had any recent pain sickle cell crisis.  Hgb is 8.8 so no phlebotomy needed this visit.  His Neurontin was sent to the correct pharmacy and he will pick up tomorrow.  We will continue to see him every 6 weeks for port flush and follow-up in 4 months.  He will contact our office with any questions or concerns. We can certainly see him sooner if need be.   Emeline Gins, NP 8/8/20192:39 PM

## 2017-11-14 NOTE — Patient Instructions (Signed)
Implanted Port Home Guide An implanted port is a type of central line that is placed under the skin. Central lines are used to provide IV access when treatment or nutrition needs to be given through a person's veins. Implanted ports are used for long-term IV access. An implanted port may be placed because:  You need IV medicine that would be irritating to the small veins in your hands or arms.  You need long-term IV medicines, such as antibiotics.  You need IV nutrition for a long period.  You need frequent blood draws for lab tests.  You need dialysis.  Implanted ports are usually placed in the chest area, but they can also be placed in the upper arm, the abdomen, or the leg. An implanted port has two main parts:  Reservoir. The reservoir is round and will appear as a small, raised area under your skin. The reservoir is the part where a needle is inserted to give medicines or draw blood.  Catheter. The catheter is a thin, flexible tube that extends from the reservoir. The catheter is placed into a large vein. Medicine that is inserted into the reservoir goes into the catheter and then into the vein.  How will I care for my incision site? Do not get the incision site wet. Bathe or shower as directed by your health care provider. How is my port accessed? Special steps must be taken to access the port:  Before the port is accessed, a numbing cream can be placed on the skin. This helps numb the skin over the port site.  Your health care provider uses a sterile technique to access the port. ? Your health care provider must put on a mask and sterile gloves. ? The skin over your port is cleaned carefully with an antiseptic and allowed to dry. ? The port is gently pinched between sterile gloves, and a needle is inserted into the port.  Only "non-coring" port needles should be used to access the port. Once the port is accessed, a blood return should be checked. This helps ensure that the port  is in the vein and is not clogged.  If your port needs to remain accessed for a constant infusion, a clear (transparent) bandage will be placed over the needle site. The bandage and needle will need to be changed every week, or as directed by your health care provider.  Keep the bandage covering the needle clean and dry. Do not get it wet. Follow your health care provider's instructions on how to take a shower or bath while the port is accessed.  If your port does not need to stay accessed, no bandage is needed over the port.  What is flushing? Flushing helps keep the port from getting clogged. Follow your health care provider's instructions on how and when to flush the port. Ports are usually flushed with saline solution or a medicine called heparin. The need for flushing will depend on how the port is used.  If the port is used for intermittent medicines or blood draws, the port will need to be flushed: ? After medicines have been given. ? After blood has been drawn. ? As part of routine maintenance.  If a constant infusion is running, the port may not need to be flushed.  How long will my port stay implanted? The port can stay in for as long as your health care provider thinks it is needed. When it is time for the port to come out, surgery will be   done to remove it. The procedure is similar to the one performed when the port was put in. When should I seek immediate medical care? When you have an implanted port, you should seek immediate medical care if:  You notice a bad smell coming from the incision site.  You have swelling, redness, or drainage at the incision site.  You have more swelling or pain at the port site or the surrounding area.  You have a fever that is not controlled with medicine.  This information is not intended to replace advice given to you by your health care provider. Make sure you discuss any questions you have with your health care provider. Document  Released: 03/26/2005 Document Revised: 09/01/2015 Document Reviewed: 12/01/2012 Elsevier Interactive Patient Education  2017 Elsevier Inc.  

## 2017-11-15 LAB — RETICULOCYTES
RBC.: 3.39 MIL/uL — AB (ref 4.22–5.81)
RETIC COUNT ABSOLUTE: 294.9 10*3/uL — AB (ref 19.0–186.0)
RETIC CT PCT: 8.7 % — AB (ref 0.4–3.1)

## 2017-11-15 LAB — FERRITIN: Ferritin: 28 ng/mL (ref 24–336)

## 2017-11-15 LAB — IRON AND TIBC
Iron: 30 ug/dL — ABNORMAL LOW (ref 42–163)
SATURATION RATIOS: 8 % — AB (ref 42–163)
TIBC: 359 ug/dL (ref 202–409)
UIBC: 328 ug/dL

## 2017-11-18 LAB — HEMOGLOBINOPATHY EVALUATION
HGB A2 QUANT: 3.2 % (ref 1.8–3.2)
HGB A: 0 % — AB (ref 96.4–98.8)
HGB C: 0 %
HGB F QUANT: 8 % — AB (ref 0.0–2.0)
Hgb S Quant: 88.8 % — ABNORMAL HIGH
Hgb Variant: 0 %

## 2017-11-22 ENCOUNTER — Other Ambulatory Visit: Payer: Self-pay | Admitting: *Deleted

## 2017-11-22 DIAGNOSIS — Z23 Encounter for immunization: Secondary | ICD-10-CM

## 2017-11-22 DIAGNOSIS — F064 Anxiety disorder due to known physiological condition: Secondary | ICD-10-CM

## 2017-11-22 DIAGNOSIS — G4701 Insomnia due to medical condition: Secondary | ICD-10-CM

## 2017-11-22 DIAGNOSIS — Z0000 Encounter for general adult medical examination without abnormal findings: Secondary | ICD-10-CM

## 2017-11-22 DIAGNOSIS — D5700 Hb-SS disease with crisis, unspecified: Secondary | ICD-10-CM

## 2017-11-22 MED ORDER — OXYCODONE HCL ER 80 MG PO T12A: 80.0000 mg | EXTENDED_RELEASE_TABLET | Freq: Two times a day (BID) | ORAL | 0 refills | Status: DC

## 2017-11-22 MED ORDER — ALPRAZOLAM 1 MG PO TABS: 1.0000 mg | ORAL_TABLET | Freq: Three times a day (TID) | ORAL | 0 refills | Status: DC | PRN

## 2017-11-22 MED ORDER — OXYCODONE HCL 30 MG PO TABS: 30.0000 mg | ORAL_TABLET | Freq: Four times a day (QID) | ORAL | 0 refills | Status: DC | PRN

## 2017-12-20 DIAGNOSIS — Z23 Encounter for immunization: Secondary | ICD-10-CM | POA: Diagnosis not present

## 2017-12-23 ENCOUNTER — Other Ambulatory Visit: Payer: Self-pay | Admitting: *Deleted

## 2017-12-23 DIAGNOSIS — G4701 Insomnia due to medical condition: Secondary | ICD-10-CM

## 2017-12-23 DIAGNOSIS — F064 Anxiety disorder due to known physiological condition: Secondary | ICD-10-CM

## 2017-12-23 DIAGNOSIS — D5700 Hb-SS disease with crisis, unspecified: Secondary | ICD-10-CM

## 2017-12-23 DIAGNOSIS — Z23 Encounter for immunization: Secondary | ICD-10-CM

## 2017-12-23 DIAGNOSIS — Z0000 Encounter for general adult medical examination without abnormal findings: Secondary | ICD-10-CM

## 2017-12-23 MED ORDER — OXYCODONE HCL ER 80 MG PO T12A: 80.0000 mg | EXTENDED_RELEASE_TABLET | Freq: Two times a day (BID) | ORAL | 0 refills | Status: DC

## 2017-12-23 MED ORDER — OXYCODONE HCL 30 MG PO TABS: 30.0000 mg | ORAL_TABLET | Freq: Four times a day (QID) | ORAL | 0 refills | Status: DC | PRN

## 2017-12-23 MED ORDER — ALPRAZOLAM 1 MG PO TABS: 1.0000 mg | ORAL_TABLET | Freq: Three times a day (TID) | ORAL | 0 refills | Status: DC | PRN

## 2018-01-23 ENCOUNTER — Other Ambulatory Visit: Payer: Self-pay | Admitting: *Deleted

## 2018-01-23 DIAGNOSIS — Z0000 Encounter for general adult medical examination without abnormal findings: Secondary | ICD-10-CM

## 2018-01-23 DIAGNOSIS — D5700 Hb-SS disease with crisis, unspecified: Secondary | ICD-10-CM

## 2018-01-23 DIAGNOSIS — F064 Anxiety disorder due to known physiological condition: Secondary | ICD-10-CM

## 2018-01-23 DIAGNOSIS — G4701 Insomnia due to medical condition: Secondary | ICD-10-CM

## 2018-01-23 DIAGNOSIS — Z23 Encounter for immunization: Secondary | ICD-10-CM

## 2018-01-23 MED ORDER — ALPRAZOLAM 1 MG PO TABS: 1.0000 mg | ORAL_TABLET | Freq: Three times a day (TID) | ORAL | 0 refills | Status: AC | PRN

## 2018-01-23 MED ORDER — OXYCODONE HCL ER 80 MG PO T12A: 80.0000 mg | EXTENDED_RELEASE_TABLET | Freq: Two times a day (BID) | ORAL | 0 refills | Status: DC

## 2018-01-23 MED ORDER — OXYCODONE HCL 30 MG PO TABS: 30.0000 mg | ORAL_TABLET | Freq: Four times a day (QID) | ORAL | 0 refills | Status: DC | PRN

## 2018-02-05 DIAGNOSIS — H0102B Squamous blepharitis left eye, upper and lower eyelids: Secondary | ICD-10-CM | POA: Diagnosis not present

## 2018-02-05 DIAGNOSIS — H0102A Squamous blepharitis right eye, upper and lower eyelids: Secondary | ICD-10-CM | POA: Diagnosis not present

## 2018-02-05 DIAGNOSIS — H04123 Dry eye syndrome of bilateral lacrimal glands: Secondary | ICD-10-CM | POA: Diagnosis not present

## 2018-02-05 DIAGNOSIS — H35033 Hypertensive retinopathy, bilateral: Secondary | ICD-10-CM | POA: Diagnosis not present

## 2018-02-05 DIAGNOSIS — H11153 Pinguecula, bilateral: Secondary | ICD-10-CM | POA: Diagnosis not present

## 2018-02-05 DIAGNOSIS — H1789 Other corneal scars and opacities: Secondary | ICD-10-CM | POA: Diagnosis not present

## 2018-02-05 DIAGNOSIS — H25013 Cortical age-related cataract, bilateral: Secondary | ICD-10-CM | POA: Diagnosis not present

## 2018-02-05 DIAGNOSIS — H40013 Open angle with borderline findings, low risk, bilateral: Secondary | ICD-10-CM | POA: Diagnosis not present

## 2018-02-05 DIAGNOSIS — H18413 Arcus senilis, bilateral: Secondary | ICD-10-CM | POA: Diagnosis not present

## 2018-02-05 DIAGNOSIS — D5720 Sickle-cell/Hb-C disease without crisis: Secondary | ICD-10-CM | POA: Diagnosis not present

## 2018-03-20 ENCOUNTER — Ambulatory Visit: Payer: PRIVATE HEALTH INSURANCE | Admitting: Hematology & Oncology

## 2018-03-20 ENCOUNTER — Other Ambulatory Visit: Payer: PRIVATE HEALTH INSURANCE

## 2018-04-22 DIAGNOSIS — D5701 Hb-SS disease with acute chest syndrome: Secondary | ICD-10-CM | POA: Diagnosis not present

## 2018-04-22 DIAGNOSIS — M8700 Idiopathic aseptic necrosis of unspecified bone: Secondary | ICD-10-CM | POA: Diagnosis not present

## 2018-04-22 DIAGNOSIS — Z79899 Other long term (current) drug therapy: Secondary | ICD-10-CM | POA: Diagnosis not present

## 2018-04-22 DIAGNOSIS — D571 Sickle-cell disease without crisis: Secondary | ICD-10-CM | POA: Diagnosis not present

## 2018-04-22 DIAGNOSIS — G8929 Other chronic pain: Secondary | ICD-10-CM | POA: Diagnosis not present

## 2024-01-03 ENCOUNTER — Telehealth: Payer: Self-pay | Admitting: *Deleted

## 2024-01-03 NOTE — Telephone Encounter (Signed)
 Patient called to reestablish care.  Was last seen in 2019.  Informed patient that we are sending sickle cell referrals to the S[facility] Cell clinic now.  Patient asked if we could send a referral.  Told patient that we could not since patient has not been seen for 6 years.  Advised patient to ask his providers to send a referral to the sickle cell clinic in [place].  PAtient appreciated the call.

## 2024-03-16 ENCOUNTER — Ambulatory Visit: Payer: Self-pay | Admitting: Nurse Practitioner

## 2024-03-16 ENCOUNTER — Ambulatory Visit (HOSPITAL_COMMUNITY)
Admission: RE | Admit: 2024-03-16 | Discharge: 2024-03-16 | Disposition: A | Source: Ambulatory Visit | Attending: Nurse Practitioner

## 2024-03-16 ENCOUNTER — Encounter: Payer: Self-pay | Admitting: Nurse Practitioner

## 2024-03-16 VITALS — BP 139/83 | HR 92 | Wt 173.4 lb

## 2024-03-16 DIAGNOSIS — D5700 Hb-SS disease with crisis, unspecified: Secondary | ICD-10-CM | POA: Diagnosis not present

## 2024-03-16 MED ORDER — IBUPROFEN 800 MG PO TABS: 800.0000 mg | ORAL_TABLET | Freq: Three times a day (TID) | ORAL | 0 refills | Status: AC | PRN

## 2024-03-16 MED ORDER — KETOROLAC TROMETHAMINE 30 MG/ML IJ SOLN
30.0000 mg | Freq: Once | INTRAMUSCULAR | Status: AC
  Administered 2024-03-16: 30 mg via INTRAMUSCULAR

## 2024-03-16 MED ORDER — OXYCODONE HCL 10 MG PO TABS: 10.0000 mg | ORAL_TABLET | Freq: Three times a day (TID) | ORAL | 0 refills | Status: DC | PRN

## 2024-03-16 NOTE — Progress Notes (Signed)
 Subjective   Patient ID: [name], male    DOB: [birth year], 43 y.o.   MRN: [id number]  Chief Complaint  Patient presents with   Establish Care   Medication Refill    ALL MEDICATIONS    Labs Only    Overall health     Referring provider: No ref. provider found  [name] is a 43 y.o. male with Past Medical History: No date: Anxiety No date: PTSD (post-traumatic stress disorder)     Comment:  2017 No date: Sickle cell anemia (HCC)    HPI  Patient presents today to establish care.  He is a sickle cell patient.  He has recently been seen by [facility] hematology.  He states that he does want to switch to our office to establish care.  We discussed that he does still need a hematologist and it would benefit for him to still be seen by [facility] as well.  Patient has been incarcerated for the past 6 years.  He did not receive pain medicine other than Tylenol  in prison.  He states that he is having a flare today.  He is having chest pain and back pain.  We will order chest x-ray and trial Toradol .  We will order oxycodone  and a low-dose to use sparingly.  We offered him an appointment with [name] this week to discuss pain medication and patient declined. Denies f/c/s, n/v/d, hemoptysis, PND, leg swelling Denies chest pain or edema   Chest pain and back pain     Allergies  Allergen Reactions   Morphine And Codeine Rash   Promethazine  Hcl     Has hallucination when this drug mixed with other drugs   Morphine Hives and Itching   Promethazine  Hcl Other (See Comments)    Has hallucination when this drug mixed with other drugs Has hallucination when this drug mixed with other drugs    Immunization History  Administered Date(s) Administered   Influenza Nasal 02/07/2016   Influenza Split 01/15/2012   Influenza,inj,Quad PF,6+ Mos 12/25/2012, 01/21/2014, 12/16/2014, 01/19/2016, 02/27/2017   Influenza-Unspecified 12/08/2012, 12/09/2014   Pfizer(Comirnaty)Fall Seasonal Vaccine  12 years and older 01/24/2024   Pneumococcal Polysaccharide-23 12/25/2012    Tobacco History: Social History   Tobacco Use  Smoking Status Never  Smokeless Tobacco Never  Tobacco Comments   never used tobacco   Counseling given: Not Answered Tobacco comments: never used tobacco   Outpatient Encounter Medications as of 03/16/2024  Medication Sig   ALPRAZolam  (XANAX ) 1 MG tablet Take 1 tablet (1 mg total) by mouth 3 (three) times daily as needed for anxiety.   folic acid  (FOLVITE ) 1 MG tablet Take by mouth.   ibuprofen  (ADVIL ) 800 MG tablet Take 1 tablet (800 mg total) by mouth every 8 (eight) hours as needed.   lactulose  (CHRONULAC ) 10 GM/15ML solution Take 15 mLs (10 g total) by mouth 2 (two) times daily as needed for mild constipation.   Lactulose  SOLN Take 5 mLs by mouth as needed. 1 tsp. As needed   Oxycodone  HCl 10 MG TABS Take 1 tablet (10 mg total) by mouth every 8 (eight) hours as needed.   [DISCONTINUED] oxyCODONE  (OXYCONTIN ) 80 mg 12 hr tablet Take 1 tablet (80 mg total) by mouth every 12 (twelve) hours.   [DISCONTINUED] oxycodone  (ROXICODONE ) 30 MG immediate release tablet Take 1 tablet (30 mg total) by mouth every 6 (six) hours as needed for pain.   gabapentin  (NEURONTIN ) 300 MG capsule Take 2 capsules (600 mg total)  by mouth 3 (three) times daily.   hydroxyurea  (HYDREA ) 500 MG capsule Take 2 capsules (1,000 mg total) by mouth daily. May take with food to minimize GI side effects. (Patient not taking: Reported on 03/16/2024)   lidocaine -prilocaine  (EMLA ) cream Apply 1 application topically as needed. Apply to port one hour before appointment (Patient not taking: Reported on 03/16/2024)   Multiple Vitamin (MULITIVITAMIN WITH MINERALS) TABS Take 1 tablet by mouth daily. (Patient not taking: Reported on 03/16/2024)   prazosin (MINIPRESS) 2 MG capsule Take by mouth. (Patient not taking: Reported on 03/16/2024)   promethazine  (PHENERGAN ) 25 MG tablet Take 1 tablet (25 mg total) by  mouth every 6 (six) hours as needed for nausea or vomiting. (Patient not taking: Reported on 03/16/2024)   traZODone  (DESYREL ) 100 MG tablet Take 1 tablet (100 mg total) by mouth at bedtime as needed for sleep.   XARELTO  20 MG TABS tablet TAKE 1 TABLET BY MOUTH EVERY DAY WITH DINNER   XARELTO  20 MG TABS tablet TAKE 1 TABLET BY MOUTH EVERY DAY WITH DINNER   Facility-Administered Encounter Medications as of 03/16/2024  Medication   ketorolac  (TORADOL ) 30 MG/ML injection 30 mg    Review of Systems  Review of Systems  Constitutional: Negative.   HENT: Negative.    Cardiovascular: Negative.   Gastrointestinal: Negative.   Allergic/Immunologic: Negative.   Neurological: Negative.   Psychiatric/Behavioral: Negative.       Objective:   BP 139/83 (BP Location: Left Arm, Patient Position: Sitting, Cuff Size: Large)   Pulse 92   Wt 173 lb 6.4 oz (78.7 kg)   SpO2 95%   BMI 27.99 kg/m   Wt Readings from Last 5 Encounters:  03/16/24 173 lb 6.4 oz (78.7 kg)  04/21/15 171 lb (77.6 kg)  03/30/15 170 lb (77.1 kg)  02/17/15 164 lb 8 oz (74.6 kg)  12/16/14 170 lb (77.1 kg)     Physical Exam Vitals and nursing note reviewed.  Constitutional:      General: He is not in acute distress.    Appearance: He is well-developed.  Cardiovascular:     Rate and Rhythm: Normal rate and regular rhythm.  Pulmonary:     Effort: Pulmonary effort is normal.     Breath sounds: Normal breath sounds.  Skin:    General: Skin is warm and dry.  Neurological:     Mental Status: He is alert and oriented to person, place, and time.       Assessment & Plan:   Sickle cell anemia with pain (HCC) -     Sickle Cell Panel -     ToxAssure Flex 15, Ur -     Microalbumin / creatinine urine ratio -     Ketorolac  Tromethamine  -     oxyCODONE  HCl; Take 1 tablet (10 mg total) by mouth every 8 (eight) hours as needed.  Dispense: 45 tablet; Refill: 0 -     Ibuprofen ; Take 1 tablet (800 mg total) by mouth every 8  (eight) hours as needed.  Dispense: 30 tablet; Refill: 0 -     DG Chest 2 View     Return in about 3 months (around 06/14/2024).   [name] GORMAN Borer, NP 03/16/2024

## 2024-03-16 NOTE — Patient Instructions (Signed)
 Living With Sickle Cell Disease Living with a long-term condition, such as sickle cell disease, can be a challenge. It can affect both your physical and mental health. You may not have total control over your condition. But proper care and treatment can help manage the effects of the disease so you can feel good and lead an active life. You can take steps to manage your condition and stay as healthy as possible. How does sickle cell disease affect me? Sickle cell disease can cause challenges that affect your quality of life. You may get sick more often as a result of organ damage and infections. Sometimes you may need to stay in the hospital. Learn how to recognize that you are not feeling well and that you may be getting sick. What actions can I take to manage my condition?  The goals of treatment are to control your symptoms and prevent and treat problems. Work with your health care provider to create a treatment plan that works for you. Taking an active role in managing your condition can help you feel more in control of your situation. Ask about possible side effects of medicines that your health care provider recommends. Discuss how you feel about having those side effects. Keeping a healthy lifestyle can help you manage your condition. This includes eating a healthy diet, getting enough sleep, and getting regular exercise. Sickle cell disease may affect your ability to take care of your basic needs. Tell your health care provider if you have concerns about any of these needs: Access to food. Housing. Safe drinking water and other utilities. Safety in your home and community. Work or school. Transportation. Paying for health care. Your health care provider may be able to connect you with community resources that can help you. How to manage stress  Living with sickle cell disease can be stressful. This disease can have a big impact on your mental health. Talk with your health care provider  about ways to reduce your stress or if you have concerns about your mental health.  To cope with stress, try: Keeping a stress diary. This can help you learn what causes your stress to start (figure out your triggers) and how to control your response to those triggers. Spending time doing things that you enjoy, such as: Hobbies. Being outdoors. Spending time with friends and people who make you laugh. Doing yoga, muscle relaxation, deep breathing, or mindfulness practices. Expressing yourself through journal writing, art, crafting, poetry, or playing music. Staying positive about your health. Try to accept that you cannot control your condition perfectly. Follow these instructions at home: Medicines Take over-the-counter and prescription medicines only as told by your health care provider. If you were prescribed antibiotics, take them as told by your health care provider. Do not stop taking them even if you start to feel better. If you develop a fever, do not take medicines to reduce the fever right away. This could cover up another problem. Contact your health care provider. Eating and drinking Drink enough fluid to keep your urine pale yellow. Drink more in hot weather and during exercise. Limit or avoid drinking alcohol. Eat a balanced and nutritious diet. Eat plenty of fruits, vegetables, whole grains, and lean protein. Take vitamins and supplements as told by your health care provider. Traveling When traveling, keep these with you: Your medical information. The names of your health care providers. Your medicines. If you have to travel by air, ask about precautions you should take. Managing pain Work with  your health care provider to create a pain management plan that works for you. The plan may include: Ways to reduce or manage your pain at home, such as: Using a heating pad. Taking a warm bath. Using healthy ways to distract you from the pain, such as hobbies or  reading. Practicing ways to relax, such as doing yoga or listening to music. Getting massages. Doing exercises or stretches as told by a physical therapist. Tracking how pain affects your daily life functions. When to seek help. Who to contact and what to do in case of a pain emergency. General instructions Do not use any products that contain nicotine or tobacco. These products include cigarettes, chewing tobacco, and vaping devices, such as e-cigarettes. These lower blood oxygen levels. If you need help quitting, ask your health care provider. Consider wearing a medical alert bracelet. Use an app or journal to track your symptoms, assess your level of pain and fatigue, and keep track of your medicines. Avoid the following: High altitudes. Very high or low temperatures and big changes in temperature. Activities that will lower your oxygen levels, such as mountain climbing or doing exercise that takes a lot of effort. Stay up to date on: Your treatment plan. Learn as much as you can about your condition. Health screenings. This will help prevent problems or catch them early on. Vaccines. This will help prevent infection. Wash your hands often with soap and water to help prevent infections. Wash them for at least 20 seconds each time. Keep all follow-up visits. Regular follow-up with your health care provider can help you better manage your condition. Where to find support You can find help and support through: Talking with a therapist or taking part in support groups. Sickle Cell Disease Foundation of [place]: www.sicklecelldisease.org Where to find more information Centers for Disease Control and Prevention: FootballExhibition.com.br American Society of Hematology: www.hematology.org Contact a health care provider if: Your symptoms get worse. You have new symptoms. You have a fever. Get help right away if: You have a painful erection of the penis that lasts a long time (priapism). You become  short of breath or are having trouble breathing. You have pain that cannot be controlled with medicine. You have any signs of a stroke. "BE FAST" is an easy way to remember the main warning signs: B - Balance. Dizziness, sudden trouble walking, or loss of balance. E - Eyes. Trouble seeing or a change in how you see. F - Face. Sudden weakness or loss of feeling of the face. The face or eyelid may droop on one side. A - Arms. Weakness or loss of feeling in an arm. This happens all of a sudden and most often on one side of the body. S - Speech. Sudden trouble speaking, slurred speech, or trouble understanding what people say. T - Time. Time to call emergency services. Write down what time symptoms started. You have other signs of a stroke, such as: A sudden, very bad headache with no known cause. Feeling like you may vomit (nausea). Vomiting. Seizure. These symptoms may be an emergency. Get help right away. Call 911. Do not wait to see if the symptoms will go away. Do not drive yourself to the hospital. Also, get help right away if: You have strong feelings of sadness or loss of hope, or you have thoughts about hurting yourself or others. Take one of these steps if you feel like you may hurt yourself or others, or have thoughts about taking your own life:  Go to your nearest emergency room. Call 911. Call the National Suicide Prevention Lifeline at (915)371-4213 or 988. This is open 24 hours a day. Text the Crisis Text Line at (909)230-5332. Summary Proper care and treatment can help manage the effects of sickle cell disease so you can feel good and lead an active life. The goals of treatment are to control your symptoms and prevent and treat problems. Taking an active role in managing your condition can help you feel more in control of your situation. Work with your health care provider to create a pain management plan that works for you. Get medical help right away as told by your health care  provider. This information is not intended to replace advice given to you by your health care provider. Make sure you discuss any questions you have with your health care provider. Document Revised: 07/03/2021 Document Reviewed: 07/03/2021 Elsevier Patient Education  2024 [employer].

## 2024-03-17 LAB — MICROALBUMIN / CREATININE URINE RATIO
Creatinine, Urine: 189 mg/dL
Microalb/Creat Ratio: 9 mg/g{creat} (ref 0–29)
Microalbumin, Urine: 17.4 ug/mL

## 2024-03-17 LAB — CMP14+CBC/D/PLT+FER+RETIC+V...

## 2024-03-18 ENCOUNTER — Ambulatory Visit: Payer: Self-pay | Admitting: Nurse Practitioner

## 2024-03-18 LAB — CMP14+CBC/D/PLT+FER+RETIC+V...
ALT: 26 IU/L (ref 0–44)
AST: 45 IU/L — ABNORMAL HIGH (ref 0–40)
Albumin: 4.6 g/dL (ref 4.1–5.1)
Alkaline Phosphatase: 94 IU/L (ref 47–123)
BUN/Creatinine Ratio: 10 (ref 9–20)
BUN: 13 mg/dL (ref 6–24)
Basos: 1 %
Bilirubin Total: 2 mg/dL — ABNORMAL HIGH (ref 0.0–1.2)
CO2: 16 mmol/L — ABNORMAL LOW (ref 20–29)
Calcium: 10.1 mg/dL (ref 8.7–10.2)
Chloride: 104 mmol/L (ref 96–106)
Creatinine, Ser: 1.29 mg/dL — ABNORMAL HIGH (ref 0.76–1.27)
EOS (ABSOLUTE): 0.1 x10E3/uL (ref 0.0–0.2)
Eos: 1 %
Ferritin: 62 ng/mL (ref 30–400)
Globulin, Total: 3.8 g/dL (ref 1.5–4.5)
Glucose: 98 mg/dL (ref 70–99)
Hematocrit: 37 % — AB (ref 37.5–51.0)
Hemoglobin: 11.4 g/dL — AB (ref 13.0–17.7)
Immature Granulocytes: 0.1 x10E3/uL (ref 0.0–0.1)
Immature Granulocytes: 1 %
Lymphs: 27 %
MCH: 25.4 pg — AB (ref 26.6–33.0)
MCHC: 30.8 g/dL — AB (ref 31.5–35.7)
MCV: 83 fL (ref 79–97)
Monocytes Absolute: 0.1 x10E3/uL (ref 0.0–0.4)
Monocytes Absolute: 1.3 x10E3/uL — AB (ref 0.1–0.9)
Monocytes: 10 %
NRBC: 1 % — AB (ref 0–0)
Neutrophils Absolute: 3.4 x10E3/uL — AB (ref 0.7–3.1)
Neutrophils Absolute: 7.7 x10E3/uL — AB (ref 1.4–7.0)
Neutrophils: 60 %
Platelets: 295 x10E3/uL (ref 150–450)
Potassium: 4.8 mmol/L (ref 3.5–5.2)
RBC: 4.48 x10E6/uL (ref 4.14–5.80)
RDW: 24.3 % — AB (ref 11.6–15.4)
Retic Ct Pct: 7.1 % — AB (ref 0.6–2.6)
Sodium: 138 mmol/L (ref 134–144)
Total Protein: 8.4 g/dL (ref 6.0–8.5)
Vit D, 25-Hydroxy: 18.5 ng/mL — ABNORMAL LOW (ref 30.0–100.0)
WBC: 12.7 x10E3/uL — AB (ref 3.4–10.8)
eGFR: 70 mL/min/1.73 (ref >59)

## 2024-03-21 LAB — TOXASSURE FLEX 15, UR
6-ACETYLMORPHINE IA: NEGATIVE ng/mL
7-aminoclonazepam: NOT DETECTED ng/mg{creat}
AMPHETAMINES IA: NEGATIVE ng/mL
Alpha-hydroxyalprazolam: NOT DETECTED ng/mg{creat}
Alpha-hydroxymidazolam: NOT DETECTED ng/mg{creat}
Alpha-hydroxytriazolam: NOT DETECTED ng/mg{creat}
Alprazolam: NOT DETECTED ng/mg{creat}
BARBITURATES IA: NEGATIVE ng/mL
Buprenorphine: NOT DETECTED ng/mg{creat}
COCAINE METABOLITE IA: NEGATIVE ng/mL
Clonazepam: NOT DETECTED ng/mg{creat}
Creatinine: 177 mg/dL (ref >=20)
Desalkylflurazepam: NOT DETECTED ng/mg{creat}
Desmethyldiazepam: NOT DETECTED ng/mg{creat}
Desmethylflunitrazepam: NOT DETECTED ng/mg{creat}
Diazepam: NOT DETECTED ng/mg{creat}
ETHYL ALCOHOL Enzymatic: NEGATIVE g/dL
Fentanyl: NOT DETECTED ng/mg{creat}
Flunitrazepam: NOT DETECTED ng/mg{creat}
Lorazepam: NOT DETECTED ng/mg{creat}
METHADONE IA: NEGATIVE ng/mL
METHADONE MTB IA: NEGATIVE ng/mL
Midazolam: NOT DETECTED ng/mg{creat}
Norbuprenorphine: NOT DETECTED ng/mg{creat}
Norfentanyl: NOT DETECTED ng/mg{creat}
OPIATE CLASS IA: NEGATIVE ng/mL
OXYCODONE CLASS IA: NEGATIVE ng/mL
Oxazepam: NOT DETECTED ng/mg{creat}
PHENCYCLIDINE IA: NEGATIVE ng/mL
TAPENTADOL, IA: NEGATIVE ng/mL
TRAMADOL IA: NEGATIVE ng/mL
Temazepam: NOT DETECTED ng/mg{creat}

## 2024-03-21 LAB — CANNABINOIDS, MS, UR RFX
Cannabinoids Confirmation: POSITIVE
Carboxy-THC: 12 ng/mg{creat}

## 2024-03-27 ENCOUNTER — Telehealth: Payer: Self-pay | Admitting: Nurse Practitioner

## 2024-03-27 ENCOUNTER — Telehealth: Payer: Self-pay

## 2024-03-27 NOTE — Telephone Encounter (Signed)
 Oxycodone  HCl 10 MG TABS [[id number]]

## 2024-03-27 NOTE — Telephone Encounter (Signed)
 Copied from CRM #[id number]. Topic: Clinical - Prescription Issue >> Mar 27, 2024  3:29 PM Kevelyn M wrote: Reason for CRM: Patient is calling about Oxycodone  HCl 10 MG TABS prescription status.

## 2024-03-31 ENCOUNTER — Other Ambulatory Visit: Payer: Self-pay | Admitting: Nurse Practitioner

## 2024-03-31 ENCOUNTER — Other Ambulatory Visit: Payer: Self-pay

## 2024-03-31 DIAGNOSIS — D5700 Hb-SS disease with crisis, unspecified: Secondary | ICD-10-CM

## 2024-03-31 DIAGNOSIS — G894 Chronic pain syndrome: Secondary | ICD-10-CM

## 2024-03-31 MED ORDER — OXYCODONE HCL 10 MG PO TABS: 10.0000 mg | ORAL_TABLET | Freq: Three times a day (TID) | ORAL | 0 refills | Status: DC | PRN

## 2024-03-31 NOTE — Telephone Encounter (Unsigned)
 Copied from CRM [id number]. Topic: Clinical - Medication Refill >> Mar 31, 2024  8:53 AM Gustabo D wrote: Medication:  Oxycodone  HCl 10 MG TABS   Has the patient contacted their pharmacy? No (Agent: If no, request that the patient contact the pharmacy for the refill. If patient does not wish to contact the pharmacy document the reason why and proceed with request.) (Agent: If yes, when and what did the pharmacy advise?)  This is the patient's preferred pharmacy:  [id number] - [place], [place] - [address] [place] [address] Phone: [phone] Fax: [phone]    Is this the correct pharmacy for this prescription? Yes If no, delete pharmacy and type the correct one.   Has the prescription been filled recently? No  Is the patient out of the medication? Yes  Has the patient been seen for an appointment in the last year OR does the patient have an upcoming appointment? Yes  Can we respond through MyChart? Yes  Agent: Please be advised that Rx refills may take up to 3 business days. We ask that you follow-up with your pharmacy. >> Mar 31, 2024  8:56 AM Gustabo D wrote: Pt said he called about this twice last week and was told this medication request was put in. I don't see that a request was put it. I am putting in the request now. He is out and really needs this medication ASAP

## 2024-04-13 ENCOUNTER — Telehealth: Payer: Self-pay | Admitting: Nurse Practitioner

## 2024-04-13 ENCOUNTER — Other Ambulatory Visit: Payer: Self-pay | Admitting: Nurse Practitioner

## 2024-04-13 ENCOUNTER — Other Ambulatory Visit: Payer: Self-pay

## 2024-04-13 DIAGNOSIS — D5700 Hb-SS disease with crisis, unspecified: Secondary | ICD-10-CM

## 2024-04-13 DIAGNOSIS — G894 Chronic pain syndrome: Secondary | ICD-10-CM

## 2024-04-13 MED ORDER — OXYCODONE HCL 10 MG PO TABS: 10.0000 mg | ORAL_TABLET | Freq: Three times a day (TID) | ORAL | 0 refills | Status: DC | PRN

## 2024-04-13 NOTE — Telephone Encounter (Signed)
 Please call pt to discuss.  Copied from CRM [id number]. Topic: General - Other >> Apr 13, 2024 10:03 AM Tiffany B wrote: Reason for CRM:   Patient states he is a patient of NP, [facility] and not NP, [facility]. Patient would like his chart to reflect. Any concerns regarding updating please reach out to caller directly.

## 2024-04-13 NOTE — Telephone Encounter (Signed)
 Please Advise.  CB.

## 2024-04-13 NOTE — Telephone Encounter (Signed)
 Please advise [facility]

## 2024-04-13 NOTE — Telephone Encounter (Signed)
 Copied from CRM [id number]. Topic: Clinical - Prescription Issue >> Apr 13, 2024 11:05 AM [name] wrote: Reason for CRM: Oxycodone  HCl 10 MG TABS  Patient is asking if his medication for oxycodone  will still be filled. Due to [facility] filling his prescription last time although his appointment is showing in March for NP [facility].    [phone] (M)

## 2024-04-13 NOTE — Telephone Encounter (Signed)
 Copied from CRM [id number]. Topic: Clinical - Medication Refill >> Apr 13, 2024  9:58 AM Tiffany B wrote   Medication: Oxycodone  HCl 10 MG TABS  Has the patient contacted their pharmacy? Yes  (Agent: If yes, when and what did the pharmacy advise?) Contact PCP   This is the patient's preferred pharmacy:  [id number] - [place], [place] - [address] [place] [address] Phone: [phone] Fax: [phone]     Is this the correct pharmacy for this prescription? Yes   Has the prescription been filled recently?   Is the patient out of the medication? No, will run out tomorrow  Has the patient been seen for an appointment in the last year OR does the patient have an upcoming appointment? Yes  Can we respond through MyChart? Yes   Agent: Please be advised that Rx refills may take up to 3 business days. We ask that you follow-up with your pharmacy.

## 2024-04-13 NOTE — Telephone Encounter (Signed)
 Called the patient and left a V/M regarding his request on switching his PCP. He established care with [facility] on December 8th and has a follow up appointment scheduled with Tonya on March 9th. If He would like to switch providers we need a reason why and we will have to see if [facility] will release him and if [facility] will be able to accept him as a patient.

## 2024-04-14 NOTE — Telephone Encounter (Signed)
 Pt was advised that he will need to try to stick with [facility] for his sickle cell care. Pt was also advised if or when Dr. Jegede has a day in clinic we can schedule him.  For now pt has agreed to establish with [facility] . K[facility]

## 2024-04-24 ENCOUNTER — Other Ambulatory Visit: Payer: Self-pay

## 2024-04-24 DIAGNOSIS — G894 Chronic pain syndrome: Secondary | ICD-10-CM

## 2024-04-24 DIAGNOSIS — D5700 Hb-SS disease with crisis, unspecified: Secondary | ICD-10-CM

## 2024-04-24 NOTE — Telephone Encounter (Signed)
 Please Advise.  CB.

## 2024-04-27 MED ORDER — OXYCODONE HCL 10 MG PO TABS: 10.0000 mg | ORAL_TABLET | Freq: Three times a day (TID) | ORAL | 0 refills | Status: AC | PRN

## 2024-05-11 ENCOUNTER — Telehealth: Payer: Self-pay | Admitting: Nurse Practitioner

## 2024-05-11 NOTE — Telephone Encounter (Signed)
 Caller & Relationship to patient:   MRN #  [id number]   Call Back Number: [phone]  Date of Last Office Visit: 04/24/2024     Date of Next Office Visit: 06/15/2024    Medication(s) to be Refilled: Oxycodone  10mg    Preferred Pharmacy:   ** Please notify patient to allow 48-72 hours to process** **Let patient know to contact pharmacy at the end of the day to make sure medication is ready. ** **If patient has not been seen in a year or longer, book an appointment **Advise to use MyChart for refill requests OR to contact their pharmacy

## 2024-05-14 ENCOUNTER — Other Ambulatory Visit: Payer: Self-pay

## 2024-05-14 DIAGNOSIS — D5700 Hb-SS disease with crisis, unspecified: Secondary | ICD-10-CM

## 2024-05-14 DIAGNOSIS — G894 Chronic pain syndrome: Secondary | ICD-10-CM

## 2024-05-14 NOTE — Telephone Encounter (Signed)
 Please Advise.  CB.

## 2024-05-15 ENCOUNTER — Other Ambulatory Visit: Payer: Self-pay

## 2024-05-15 DIAGNOSIS — G894 Chronic pain syndrome: Secondary | ICD-10-CM

## 2024-05-15 DIAGNOSIS — D5700 Hb-SS disease with crisis, unspecified: Secondary | ICD-10-CM

## 2024-05-15 NOTE — Telephone Encounter (Signed)
 Please advise [facility]

## 2024-06-15 ENCOUNTER — Ambulatory Visit: Payer: Self-pay | Admitting: Nurse Practitioner
